# Patient Record
Sex: Female | Born: 1975 | Race: White | Hispanic: No | Marital: Married | Smoking: Current every day smoker
Health system: Southern US, Community
[De-identification: ages and names within clinical notes are randomized; demographics above are authoritative.]

## PROBLEM LIST (undated history)

## (undated) DIAGNOSIS — Z7289 Other problems related to lifestyle: Secondary | ICD-10-CM

## (undated) DIAGNOSIS — Z9889 Other specified postprocedural states: Secondary | ICD-10-CM

## (undated) DIAGNOSIS — F32A Depression, unspecified: Secondary | ICD-10-CM

## (undated) DIAGNOSIS — R112 Nausea with vomiting, unspecified: Secondary | ICD-10-CM

## (undated) HISTORY — PX: CHOLECYSTECTOMY: SHX55

---

## 2021-01-03 DIAGNOSIS — Z720 Tobacco use: Secondary | ICD-10-CM | POA: Insufficient documentation

## 2021-09-17 ENCOUNTER — Emergency Department: Payer: 59

## 2021-09-17 ENCOUNTER — Other Ambulatory Visit: Payer: Self-pay

## 2021-09-17 ENCOUNTER — Emergency Department
Admission: EM | Admit: 2021-09-17 | Discharge: 2021-09-17 | Disposition: A | Discharge status: Home / Self Care | Payer: 59 | Attending: Emergency Medicine | Admitting: Emergency Medicine

## 2021-09-17 DIAGNOSIS — M546 Pain in thoracic spine: Secondary | ICD-10-CM | POA: Insufficient documentation

## 2021-09-17 DIAGNOSIS — M25511 Pain in right shoulder: Secondary | ICD-10-CM | POA: Diagnosis present

## 2021-09-17 DIAGNOSIS — M62830 Muscle spasm of back: Secondary | ICD-10-CM | POA: Insufficient documentation

## 2021-09-17 LAB — CBC
HCT: 42.6 % (ref 36.0–46.0)
Hemoglobin: 14.6 g/dL (ref 12.0–15.0)
MCH: 30.9 pg (ref 26.0–34.0)
MCHC: 34.3 g/dL (ref 30.0–36.0)
MCV: 90.1 fL (ref 80.0–100.0)
Platelets: 125 10*3/uL — ABNORMAL LOW (ref 150–400)
RBC: 4.73 MIL/uL (ref 3.87–5.11)
RDW: 12 % (ref 11.5–15.5)
WBC: 4.1 10*3/uL (ref 4.0–10.5)
nRBC: 0 % (ref 0.0–0.2)

## 2021-09-17 LAB — BASIC METABOLIC PANEL
Anion gap: 10 (ref 5–15)
BUN: 23 mg/dL — ABNORMAL HIGH (ref 6–20)
CO2: 26 mmol/L (ref 22–32)
Calcium: 8.9 mg/dL (ref 8.9–10.3)
Chloride: 104 mmol/L (ref 98–111)
Creatinine, Ser: 0.88 mg/dL (ref 0.44–1.00)
GFR, Estimated: >60 mL/min (ref >60)
Glucose, Bld: 108 mg/dL — ABNORMAL HIGH (ref 70–99)
Potassium: 3.8 mmol/L (ref 3.5–5.1)
Sodium: 140 mmol/L (ref 135–145)

## 2021-09-17 LAB — TROPONIN I (HIGH SENSITIVITY): Troponin I (High Sensitivity): <2 ng/L (ref <18)

## 2021-09-17 IMAGING — CR DG SHOULDER 2+V*R*
1 series · 3 of 3 positions shown · non-contrast
Comparison: None.

CLINICAL DATA: Right-sided shoulder pain, no injury

EXAM:
RIGHT SHOULDER - 2+ VIEW

[Series 1: dg shoulder right · 0.14mm/px · 3 of 3 slices shown]
[im 1/3]
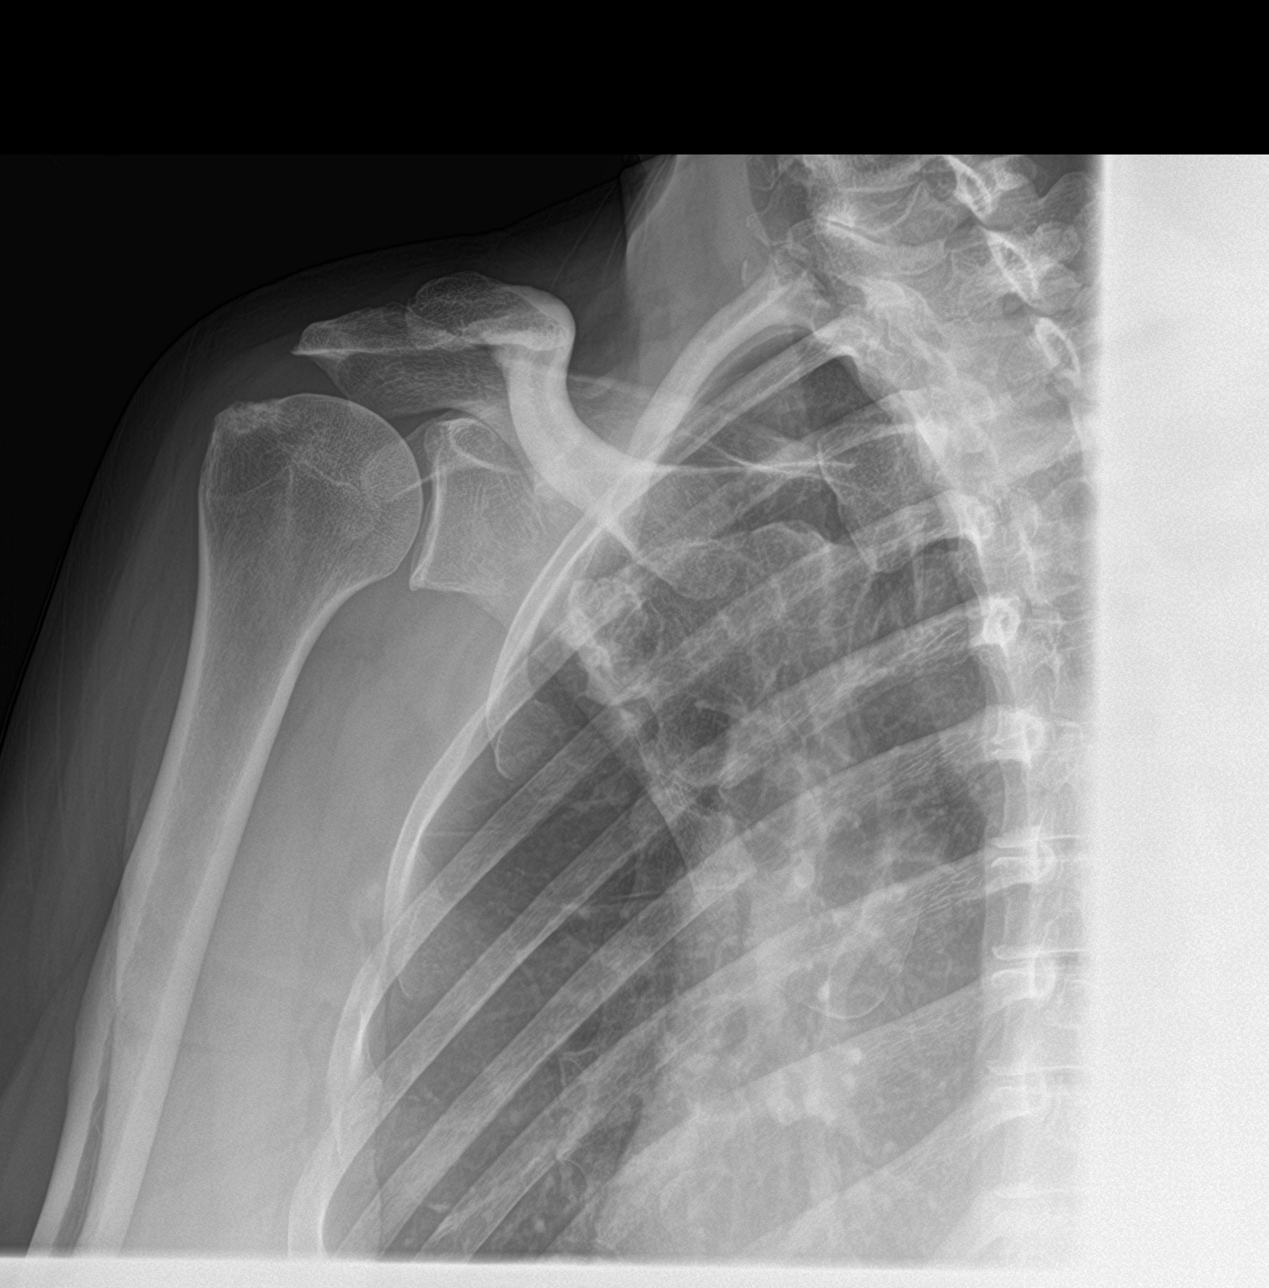
[im 2/3]
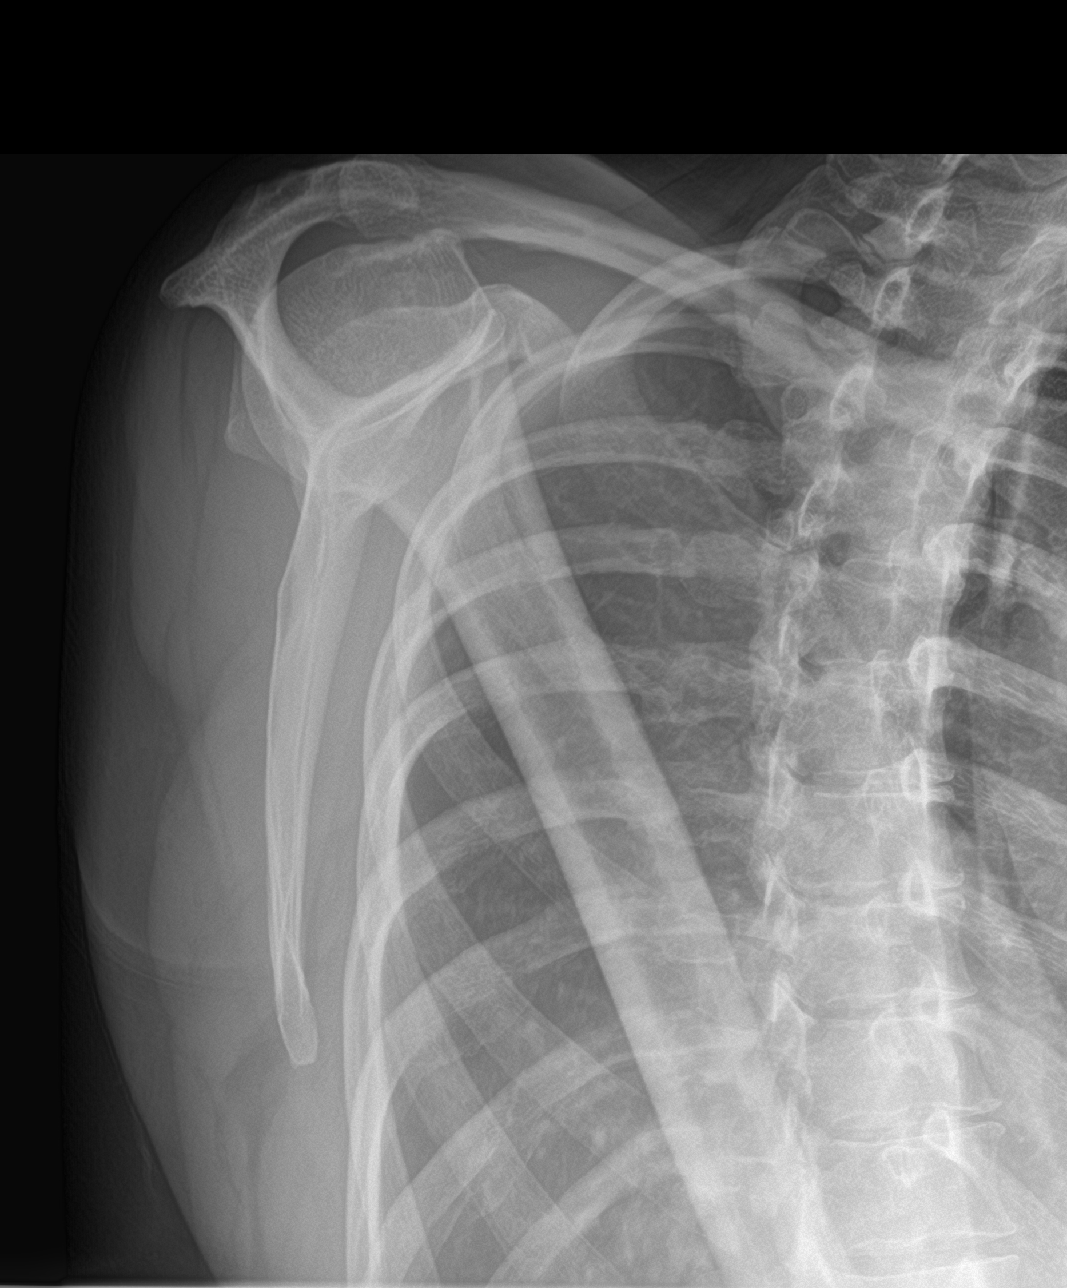
[im 3/3]
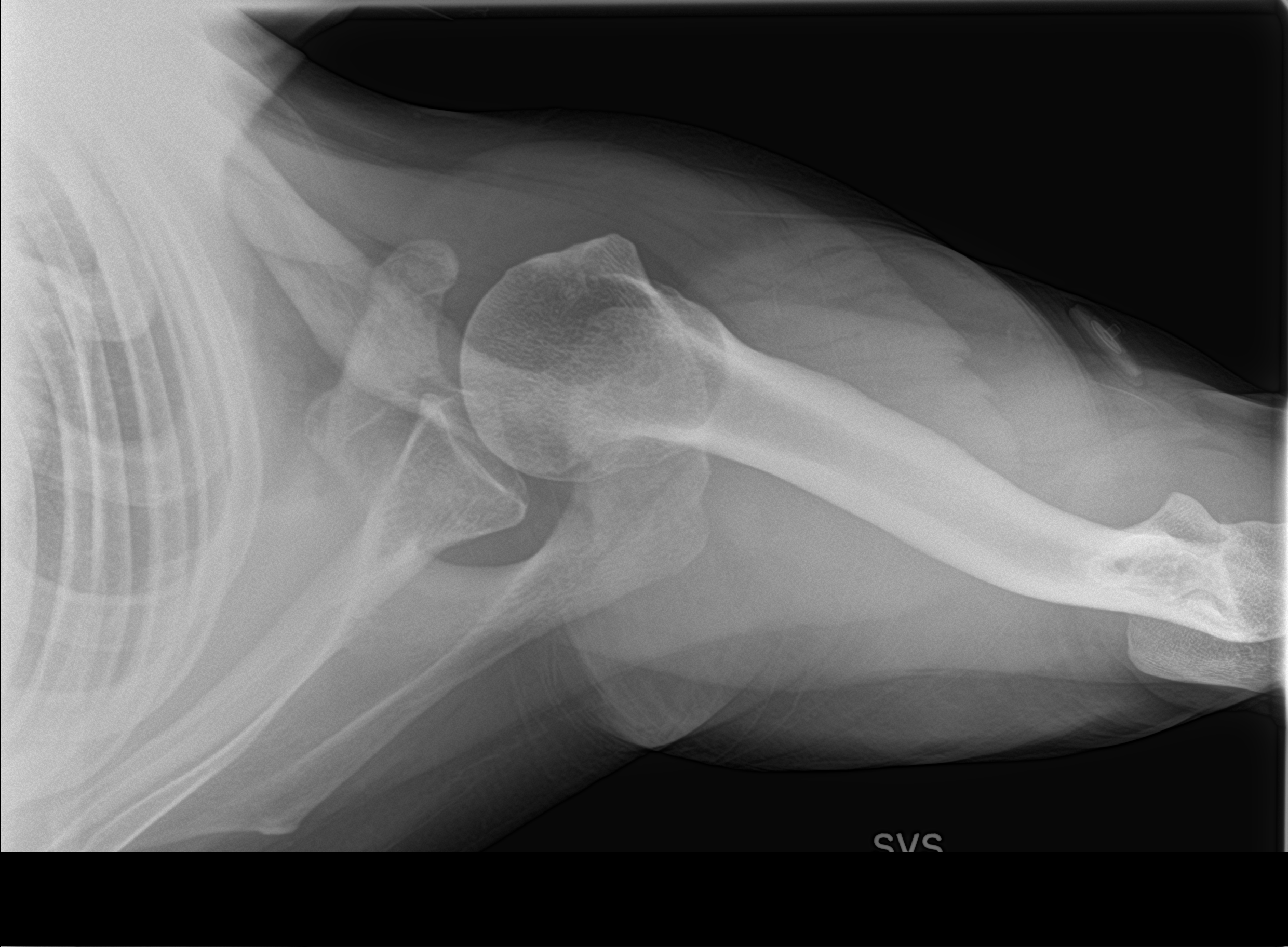

[3 of 3 positions shown; findings below may reference images not displayed]

FINDINGS: No fracture or dislocation of the right shoulder. Mild glenohumeral
and acromioclavicular arthrosis. The partially imaged right chest is
unremarkable.
IMPRESSION: No fracture or dislocation of the right shoulder. Mild glenohumeral
and acromioclavicular arthrosis.

## 2021-09-17 IMAGING — CR DG CHEST 2V
1 series · 2 of 2 positions shown · non-contrast
Comparison: None.

CLINICAL DATA: Right-sided chest pain, no injury

EXAM:
CHEST - 2 VIEW

[Series 1: dg chest 2 view · 0.14mm/px · 2 of 2 slices shown]
[im 1/2]
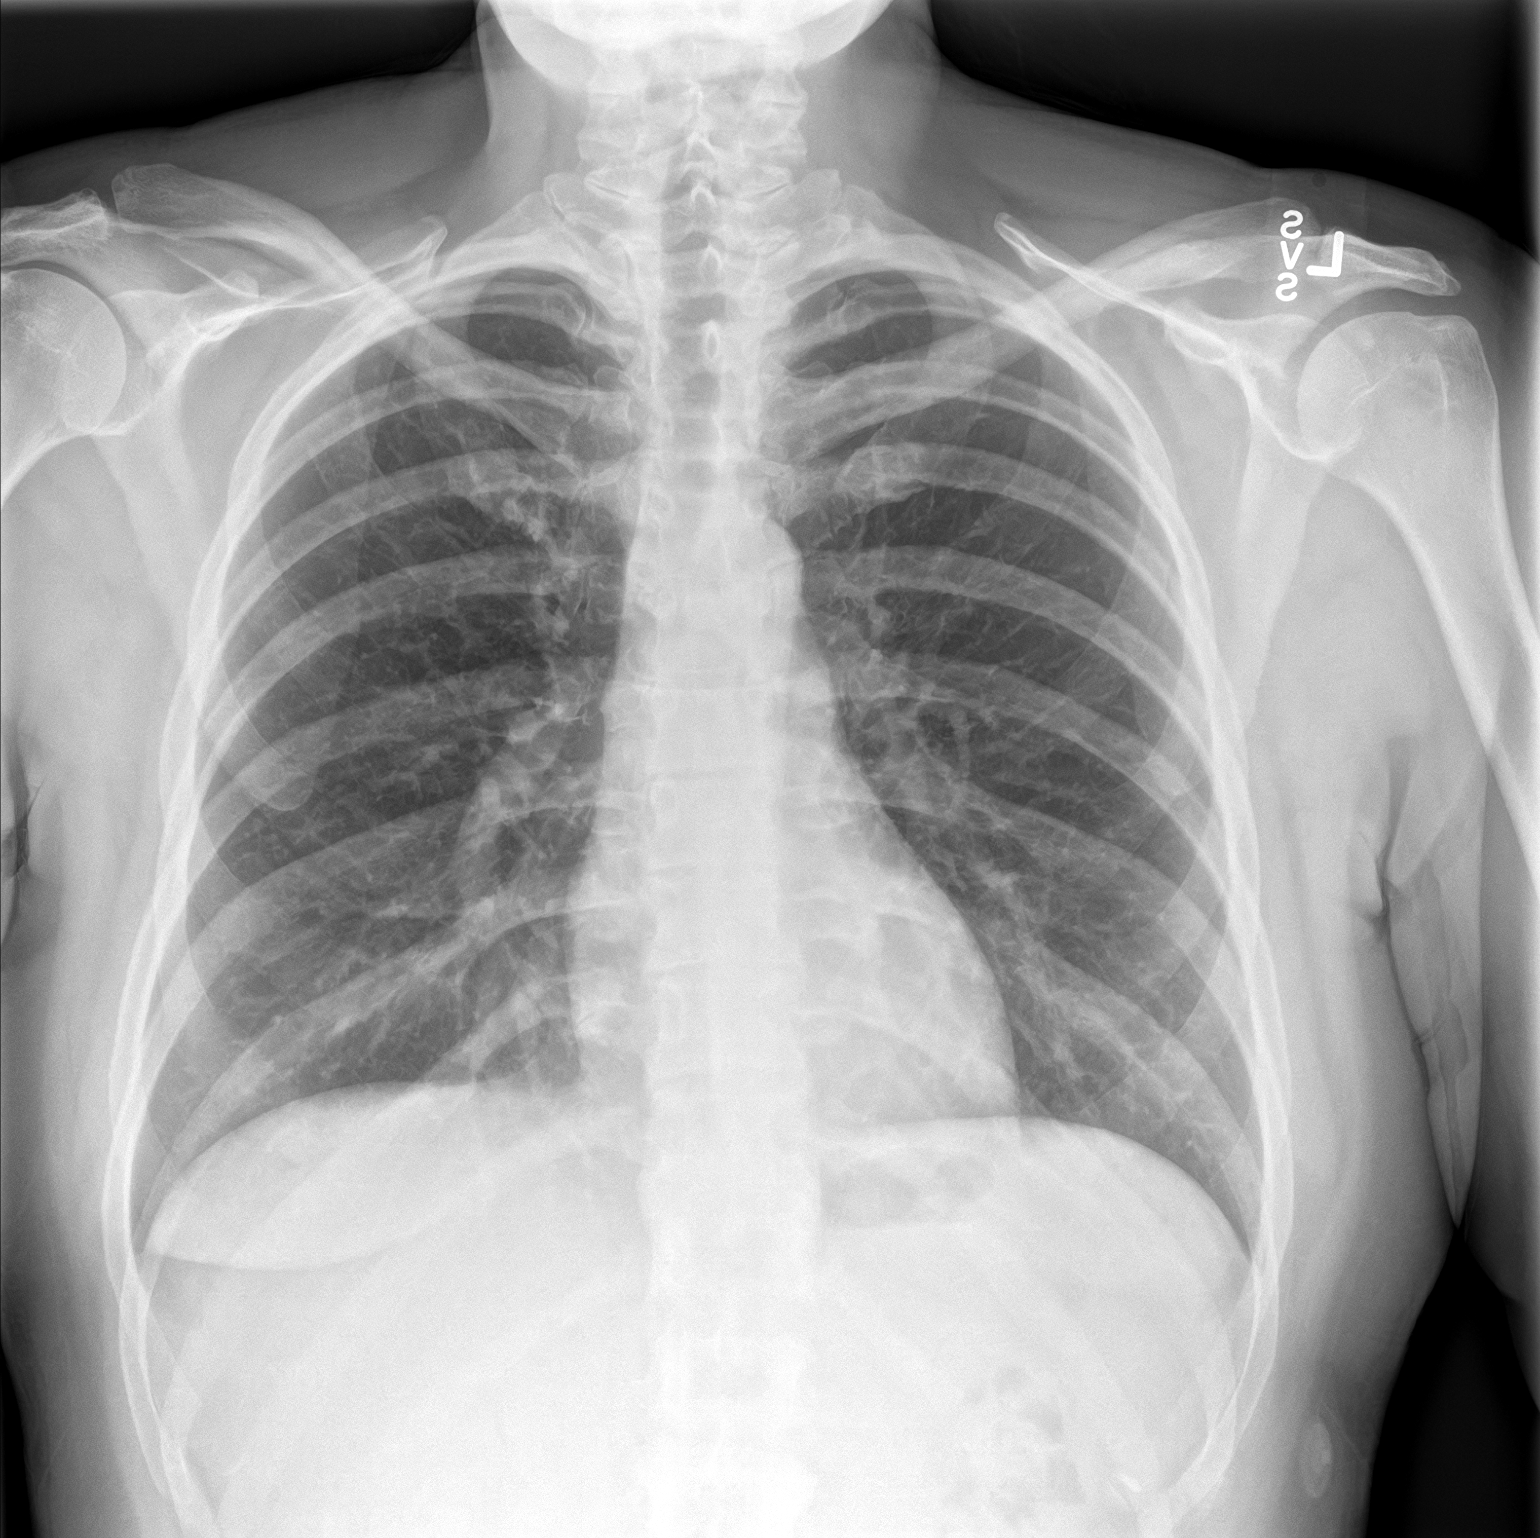
[im 2/2]
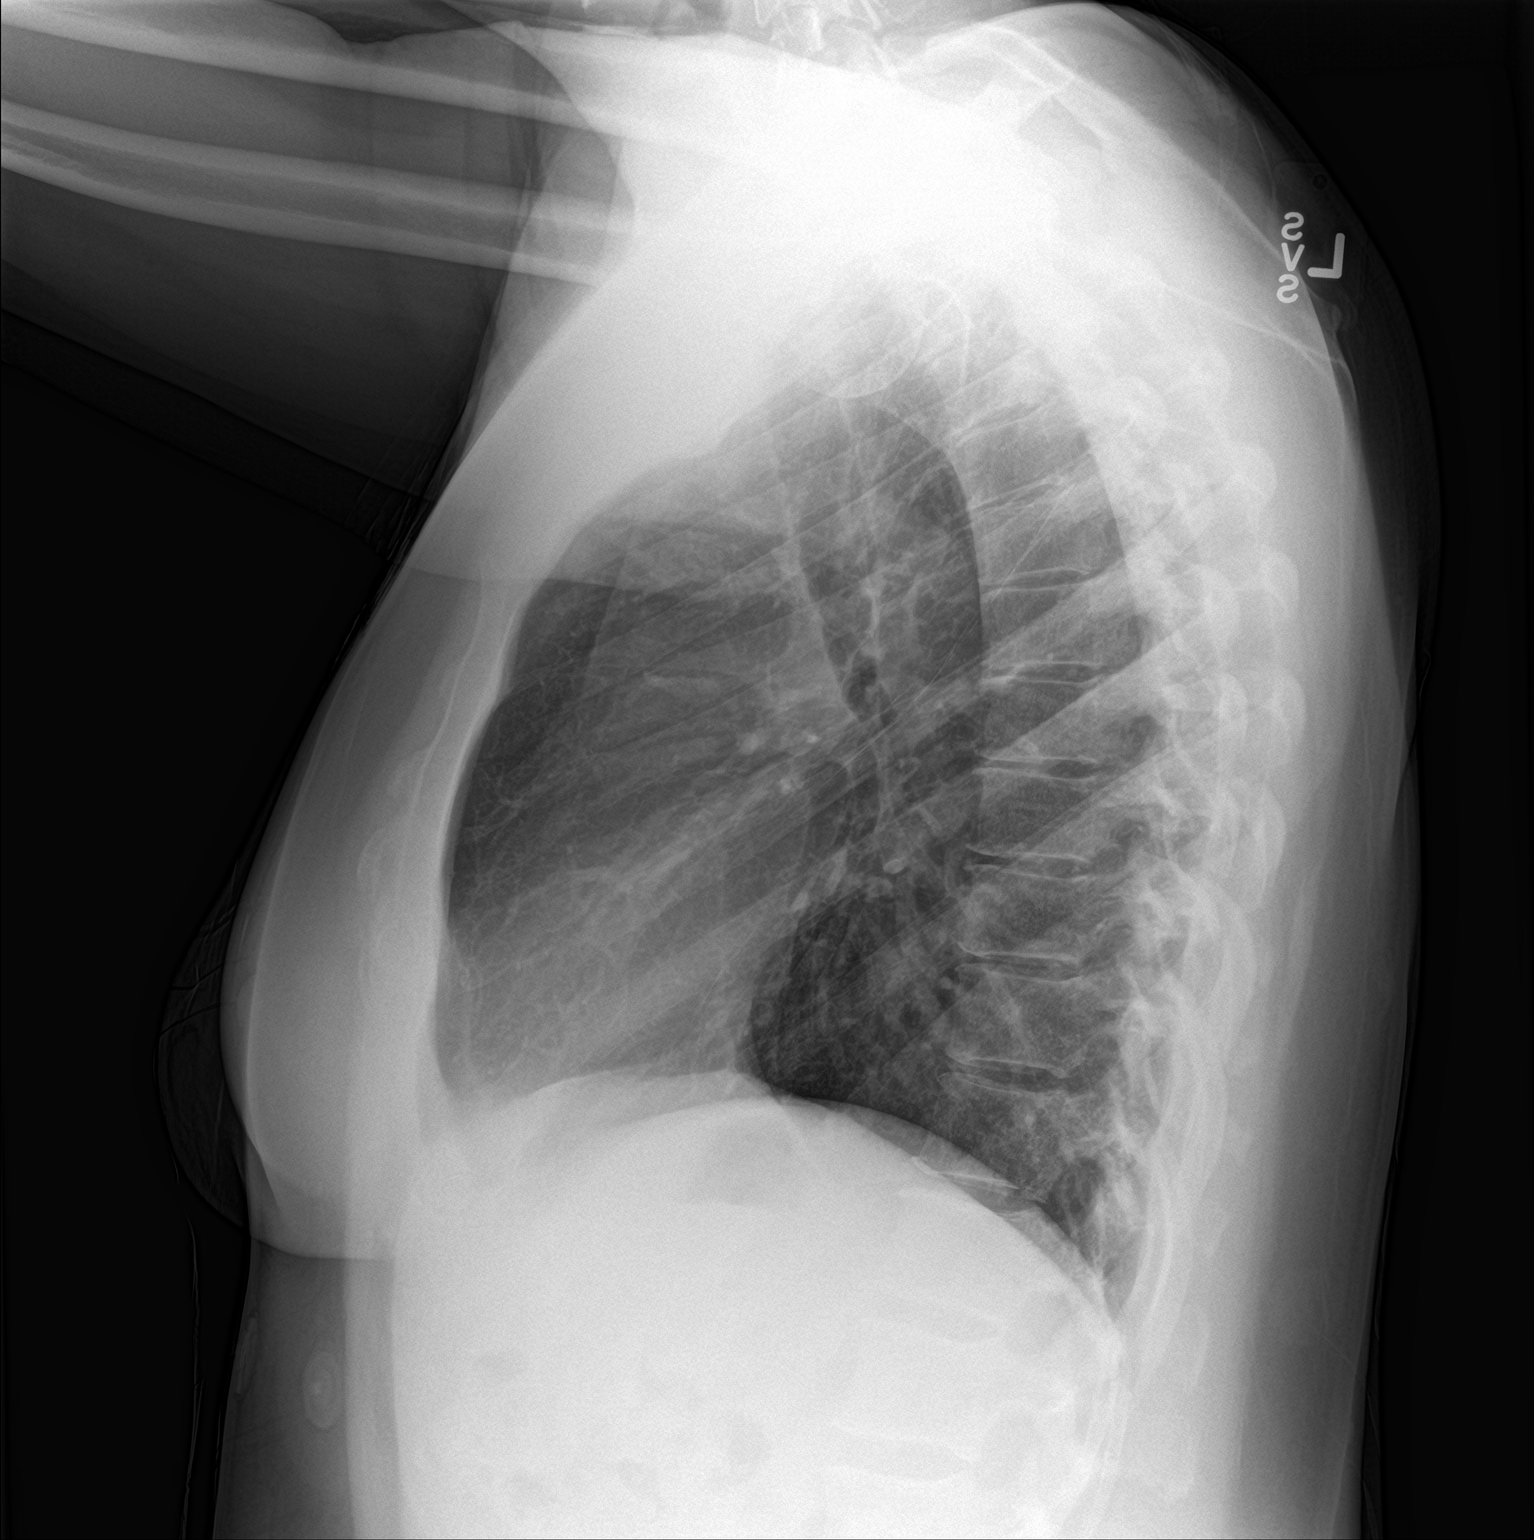

[2 of 2 positions shown; findings below may reference images not displayed]

FINDINGS: The heart size and mediastinal contours are within normal limits.
Both lungs are clear. The visualized skeletal structures are
unremarkable.
IMPRESSION: No acute abnormality of the lungs.

## 2021-09-17 MED ORDER — NAPROXEN 500 MG PO TABS: 500.0000 mg | ORAL_TABLET | Freq: Two times a day (BID) | ORAL | 0 refills | Status: DC

## 2021-09-17 MED ORDER — ONDANSETRON 4 MG PO TBDP
ORAL_TABLET | ORAL | Status: AC
  Filled 2021-09-17: qty 1

## 2021-09-17 MED ORDER — KETOROLAC TROMETHAMINE 60 MG/2ML IM SOLN
60.0000 mg | Freq: Once | INTRAMUSCULAR | Status: AC
  Administered 2021-09-17: 60 mg via INTRAMUSCULAR
  Filled 2021-09-17: qty 2

## 2021-09-17 MED ORDER — ONDANSETRON HCL 4 MG/2ML IJ SOLN: 4.0000 mg | Freq: Once | INTRAMUSCULAR | Status: DC

## 2021-09-17 MED ORDER — CYCLOBENZAPRINE HCL 10 MG PO TABS: 10.0000 mg | ORAL_TABLET | Freq: Three times a day (TID) | ORAL | 0 refills | Status: DC | PRN

## 2021-09-17 MED ORDER — MORPHINE SULFATE (PF) 4 MG/ML IV SOLN
6.0000 mg | Freq: Once | INTRAVENOUS | Status: AC
Start: 2021-09-17 — End: 2021-09-17
  Administered 2021-09-17: 6 mg via INTRAMUSCULAR
  Filled 2021-09-17: qty 2

## 2021-09-17 MED ORDER — MORPHINE SULFATE (PF) 4 MG/ML IV SOLN
6.0000 mg | Freq: Once | INTRAVENOUS | Status: DC
Start: 2021-09-17 — End: 2021-09-17

## 2021-09-17 MED ORDER — KETOROLAC TROMETHAMINE 30 MG/ML IJ SOLN
15.0000 mg | Freq: Once | INTRAMUSCULAR | Status: DC
Start: 2021-09-17 — End: 2021-09-17

## 2021-09-17 MED ORDER — ONDANSETRON 4 MG PO TBDP
4.0000 mg | ORAL_TABLET | Freq: Once | ORAL | Status: AC
  Administered 2021-09-17: 4 mg via ORAL
  Filled 2021-09-17: qty 1

## 2021-09-17 MED ORDER — BUPIVACAINE HCL (PF) 0.25 % IJ SOLN
10.0000 mL | Freq: Once | INTRAMUSCULAR | Status: AC
  Administered 2021-09-17: 10 mL
  Filled 2021-09-17: qty 10

## 2021-09-17 MED ORDER — ALUM & MAG HYDROXIDE-SIMETH 200-200-20 MG/5ML PO SUSP
30.0000 mL | Freq: Once | ORAL | Status: AC
Start: 2021-09-17 — End: 2021-09-17
  Administered 2021-09-17: 30 mL via ORAL
  Filled 2021-09-17: qty 30

## 2021-09-17 MED ORDER — LIDOCAINE VISCOUS HCL 2 % MT SOLN
15.0000 mL | Freq: Once | OROMUCOSAL | Status: AC
Start: 2021-09-17 — End: 2021-09-17
  Administered 2021-09-17: 15 mL via ORAL
  Filled 2021-09-17: qty 15

## 2021-09-17 NOTE — ED Provider Notes (Signed)
? ?[facility] ?Provider Note ? ? ? Event Date/Time  ? First MD Initiated Contact with Patient 09/17/21 2031   ?  (approximate) ? ? ?History  ? ?Shoulder Pain ? ? ?HPI ? ?Jill Cox is a 46 y.o. female here with shoulder/back pain.  The patient states that earlier today, after she was getting out of the car after driving from [place], she experienced acute, severe, paraspinal back/upper shoulder pain.  The pain is severe, worse with any movement, and limiting her ability to move or use her right arm.  She denies any trauma.  She does state that she was picking up sticks and helping around a house that she is building in [place], but denies any significant or direct trauma.  She also was using her phone in the car.  Denies history of similar issues.  Denies numbness or weakness of the upper or lower extremities.  Denies any cough.  No pleuritic component.  No leg swelling.  No recent immobilization.  She is not on OCPs. ?  ? ? ?Physical Exam  ? ?Triage Vital Signs: ?ED Triage Vitals [09/17/21 [id number]  ?Enc Vitals Group  ?   BP (!) 163/89  ?   Pulse Rate 85  ?   Resp 16  ?   Temp 99.2 ?F (37.3 ?C)  ?   Temp Source Oral  ?   SpO2 100 %  ?   Weight 180 lb (81.6 kg)  ?   Height 5\' 6"  (1.676 m)  ?   Head Circumference   ?   Peak Flow   ?   Pain Score 6  ?   Pain Loc   ?   Pain Edu?   ?   Excl. in [place]?   ? ? ?Most recent vital signs: ?Vitals:  ? 09/17/21 1904 09/17/21 2345  ?BP: (!) 163/89 (!) 148/72  ?Pulse: 85 86  ?Resp: 16 16  ?Temp: 99.2 ?F (37.3 ?C)   ?SpO2: 100% 100%  ? ? ? ?General: Awake, no distress.  ?CV:  Good peripheral perfusion.  Regular rate and rhythm.  No murmurs. ?Resp:  Normal effort.  No chest wall tenderness.  Lungs clear. ?Abd:  No distention.  Abdomen soft, nontender, nondistended ?Other:  Pinpoint tenderness to palpation over the right paraspinal area between the upper thoracic spine and scapula.  There is a focal area of tenderness that correlates with her reported  pain.  No overlying skin changes.  No bruising.  Strength and sensation fully intact bilateral upper and lower extremities. ? ? ?ED Results / Procedures / Treatments  ? ?Labs ?(all labs ordered are listed, but only abnormal results are displayed) ?Labs Reviewed  ?BASIC METABOLIC PANEL - Abnormal; Notable for the following components:  ?    Result Value  ? Glucose, Bld 108 (*)   ? BUN 23 (*)   ? All other components within normal limits  ?CBC - Abnormal; Notable for the following components:  ? Platelets 125 (*)   ? All other components within normal limits  ?POC URINE PREG, ED  ?POC URINE PREG, ED  ?TROPONIN I (HIGH SENSITIVITY)  ? ? ? ?EKG normal sinus rhythm, ventricular rate 81.  PR 114, QRS 88, QTc 453.  No acute ST elevations or depressions. ? ? ? ?RADIOLOGY ?Chest x-ray: No acute abnormality ?DG shoulder right: No fracture or dislocation ? ? ?I also independently reviewed and agree wit radiologist interpretations. ? ? ?PROCEDURES: ? ?Critical Care performed: No ? ? ?  MEDICATIONS ORDERED IN ED: ?Medications  ?bupivacaine (PF) (MARCAINE) 0.25 % injection 10 mL (10 mLs Infiltration Given by Other 09/17/21 2200)  ?ondansetron (ZOFRAN-ODT) disintegrating tablet 4 mg (4 mg Oral Given 09/17/21 2156)  ?ketorolac (TORADOL) injection 60 mg (60 mg Intramuscular Given 09/17/21 2125)  ?morphine (PF) 4 MG/ML injection 6 mg (6 mg Intramuscular Given 09/17/21 2125)  ?alum & mag hydroxide-simeth (MAALOX/MYLANTA) 200-200-20 MG/5ML suspension 30 mL (30 mLs Oral Given 09/17/21 2253)  ?  And  ?lidocaine (XYLOCAINE) 2 % viscous mouth solution 15 mL (15 mLs Oral Given 09/17/21 2253)  ? ? ? ?IMPRESSION / MDM / ASSESSMENT AND PLAN / ED COURSE  ?I reviewed the triage vital signs and the nursing notes. ?             ?               ? ? ?The patient is on the cardiac monitor to evaluate for evidence of arrhythmia and/or significant heart rate changes. ? ? ?MDM:  ?46 yo F here with acute, R paraspinal back/shoulder pain. Suspect paraspinal spasm,  trapezius strain in setting of picking up sticks at her property. Pt has pinpoint, reproducible tenderness. No weakness, numbness, or signs of radiculopathy. CXR is clear. No bony abnormalities noted on radiography. EKG nonischemic, trop negative, do not suspect referred cardiac pain. She is PERC negative and has no tachypnea, tachycardia, hypoxia, or signs of PE. No leukocytosis or anemia. Renal function is normal. Pt given IM analgesia with mild improvement, but +nausea 2/2 opioids. Discussed management options, and given pt's intolerance of analgesia, local trigger point injection performed with 5 cc of 0.25% bupivicaine. Full sterile technique used, needle inserted and aspirated with no return of blood or air, then injected. Pt tolerated well and had sx improvement. Will dc with NSAIDs, flexeril, outpt follow-up. ? ? ?MEDICATIONS GIVEN IN ED: ?Medications  ?bupivacaine (PF) (MARCAINE) 0.25 % injection 10 mL (10 mLs Infiltration Given by Other 09/17/21 2200)  ?ondansetron (ZOFRAN-ODT) disintegrating tablet 4 mg (4 mg Oral Given 09/17/21 2156)  ?ketorolac (TORADOL) injection 60 mg (60 mg Intramuscular Given 09/17/21 2125)  ?morphine (PF) 4 MG/ML injection 6 mg (6 mg Intramuscular Given 09/17/21 2125)  ?alum & mag hydroxide-simeth (MAALOX/MYLANTA) 200-200-20 MG/5ML suspension 30 mL (30 mLs Oral Given 09/17/21 2253)  ?  And  ?lidocaine (XYLOCAINE) 2 % viscous mouth solution 15 mL (15 mLs Oral Given 09/17/21 2253)  ? ? ? ?Consults:  ?None ? ? ?EMR reviewed  ?NA ? ? ? ? ?FINAL CLINICAL IMPRESSION(S) / ED DIAGNOSES  ? ?Final diagnoses:  ?Paraspinal muscle spasm  ? ? ? ?Rx / DC Orders  ? ?ED Discharge Orders   ? ?      Ordered  ?  naproxen (NAPROSYN) 500 MG tablet  2 times daily with meals       ? 09/17/21 2323  ?  cyclobenzaprine (FLEXERIL) 10 MG tablet  3 times daily PRN       ? 09/17/21 2323  ? ?  ?  ? ?  ? ? ? ?Note:  This document was prepared using Dragon voice recognition software and may include unintentional dictation  errors. ?  ?Duffy Bruce, MD ?09/18/21 0155 ? ?

## 2021-09-17 NOTE — Discharge Instructions (Signed)
As we discussed, I suspect your symptoms are due to muscular spasm from a paraspinal muscle. ? ?I would recommend taking prescription strength naproxen twice daily with a meal for 7 days.  This is essentially a stronger dose of Aleve.  It should not cause nausea. ? ?You can take the Flexeril as needed for muscle spasms.  I would consider taking at least 1/2 tablet 3 times a day for the next 2 to 3 days to help prevent recurrent spasm, then taking as needed. ? ?You can take Tylenol 1000 mg every 6 hours for pain as well. ? ?Consider a topical Lidocaine patch or Voltaren gel (both can be purchased over-the-counter) for pain. ?

## 2021-09-17 NOTE — ED Triage Notes (Signed)
Pt with sudden onset of right posterior shoulder pain approx 4 hours ago. Pt denies shob, chest pain, radiation down arm.  ?

## 2021-09-17 NOTE — ED Notes (Signed)
Medicated for nausea per verbal order. Provider at bedside ?

## 2021-09-18 NOTE — ED Notes (Signed)
Discharge instructions, scripts and follow-up information provided to patient. Patient verbalized understanding. Patient ambulated to the waiting room with a steady gait and without assistance. ?

## 2021-09-23 ENCOUNTER — Emergency Department
Admission: EM | Admit: 2021-09-23 | Discharge: 2021-09-23 | Disposition: A | Discharge status: Home / Self Care | Payer: 59 | Source: Home / Self Care | Attending: Emergency Medicine | Admitting: Emergency Medicine

## 2021-09-23 ENCOUNTER — Other Ambulatory Visit: Payer: Self-pay

## 2021-09-23 DIAGNOSIS — M25532 Pain in left wrist: Secondary | ICD-10-CM | POA: Insufficient documentation

## 2021-09-23 DIAGNOSIS — M791 Myalgia, unspecified site: Secondary | ICD-10-CM | POA: Insufficient documentation

## 2021-09-23 DIAGNOSIS — A419 Sepsis, unspecified organism: Secondary | ICD-10-CM | POA: Diagnosis not present

## 2021-09-23 DIAGNOSIS — M130 Polyarthritis, unspecified: Secondary | ICD-10-CM | POA: Insufficient documentation

## 2021-09-23 DIAGNOSIS — M25571 Pain in right ankle and joints of right foot: Secondary | ICD-10-CM | POA: Insufficient documentation

## 2021-09-23 DIAGNOSIS — R7881 Bacteremia: Secondary | ICD-10-CM | POA: Diagnosis not present

## 2021-09-23 LAB — CBC WITH DIFFERENTIAL/PLATELET
Abs Immature Granulocytes: 0.31 10*3/uL — ABNORMAL HIGH (ref 0.00–0.07)
Basophils Absolute: 0 10*3/uL (ref 0.0–0.1)
Basophils Relative: 0 %
Eosinophils Absolute: 0 10*3/uL (ref 0.0–0.5)
Eosinophils Relative: 0 %
HCT: 42.1 % (ref 36.0–46.0)
Hemoglobin: 14.1 g/dL (ref 12.0–15.0)
Immature Granulocytes: 2 %
Lymphocytes Relative: 3 %
Lymphs Abs: 0.5 10*3/uL — ABNORMAL LOW (ref 0.7–4.0)
MCH: 30.3 pg (ref 26.0–34.0)
MCHC: 33.5 g/dL (ref 30.0–36.0)
MCV: 90.5 fL (ref 80.0–100.0)
Monocytes Absolute: 0.3 10*3/uL (ref 0.1–1.0)
Monocytes Relative: 2 %
Neutro Abs: 14.9 10*3/uL — ABNORMAL HIGH (ref 1.7–7.7)
Neutrophils Relative %: 93 %
Platelets: 130 10*3/uL — ABNORMAL LOW (ref 150–400)
RBC: 4.65 MIL/uL (ref 3.87–5.11)
RDW: 13.5 % (ref 11.5–15.5)
WBC: 16 10*3/uL — ABNORMAL HIGH (ref 4.0–10.5)
nRBC: 0 % (ref 0.0–0.2)

## 2021-09-23 LAB — COMPREHENSIVE METABOLIC PANEL
ALT: 59 U/L — ABNORMAL HIGH (ref 0–44)
AST: 54 U/L — ABNORMAL HIGH (ref 15–41)
Albumin: 2.7 g/dL — ABNORMAL LOW (ref 3.5–5.0)
Alkaline Phosphatase: 121 U/L (ref 38–126)
Anion gap: 11 (ref 5–15)
BUN: 32 mg/dL — ABNORMAL HIGH (ref 6–20)
CO2: 22 mmol/L (ref 22–32)
Calcium: 10.5 mg/dL — ABNORMAL HIGH (ref 8.9–10.3)
Chloride: 104 mmol/L (ref 98–111)
Creatinine, Ser: 1.27 mg/dL — ABNORMAL HIGH (ref 0.44–1.00)
GFR, Estimated: 52 mL/min — ABNORMAL LOW (ref >60)
Glucose, Bld: 108 mg/dL — ABNORMAL HIGH (ref 70–99)
Potassium: 3.6 mmol/L (ref 3.5–5.1)
Sodium: 137 mmol/L (ref 135–145)
Total Bilirubin: 1.2 mg/dL (ref 0.3–1.2)
Total Protein: 6.5 g/dL (ref 6.5–8.1)

## 2021-09-23 LAB — CK: Total CK: 267 U/L — ABNORMAL HIGH (ref 38–234)

## 2021-09-23 MED ORDER — ONDANSETRON HCL 4 MG/2ML IJ SOLN
4.0000 mg | Freq: Once | INTRAMUSCULAR | Status: AC
  Administered 2021-09-23: 4 mg via INTRAVENOUS
  Filled 2021-09-23: qty 2

## 2021-09-23 MED ORDER — OXYCODONE-ACETAMINOPHEN 5-325 MG PO TABS: 1.0000 | ORAL_TABLET | ORAL | 0 refills | Status: DC | PRN

## 2021-09-23 MED ORDER — SODIUM CHLORIDE 0.9 % IV BOLUS
1000.0000 mL | Freq: Once | INTRAVENOUS | Status: AC
Start: 2021-09-23 — End: 2021-09-23
  Administered 2021-09-23: 1000 mL via INTRAVENOUS

## 2021-09-23 MED ORDER — MORPHINE SULFATE (PF) 2 MG/ML IV SOLN
2.0000 mg | Freq: Once | INTRAVENOUS | Status: AC
  Administered 2021-09-23: 2 mg via INTRAVENOUS
  Filled 2021-09-23: qty 1

## 2021-09-23 MED ORDER — CEPHALEXIN 500 MG PO CAPS
500.0000 mg | ORAL_CAPSULE | Freq: Once | ORAL | Status: AC
  Administered 2021-09-23: 500 mg via ORAL
  Filled 2021-09-23: qty 1

## 2021-09-23 MED ORDER — CEPHALEXIN 500 MG PO CAPS: 500.0000 mg | ORAL_CAPSULE | Freq: Four times a day (QID) | ORAL | 0 refills | Status: DC

## 2021-09-23 MED ORDER — NAPROXEN 500 MG PO TABS: 500.0000 mg | ORAL_TABLET | Freq: Two times a day (BID) | ORAL | 0 refills | Status: DC

## 2021-09-23 MED ORDER — PREDNISONE 20 MG PO TABS: 60.0000 mg | ORAL_TABLET | Freq: Every day | ORAL | 0 refills | Status: DC

## 2021-09-23 MED ORDER — HYDROMORPHONE HCL 1 MG/ML IJ SOLN
0.5000 mg | Freq: Once | INTRAMUSCULAR | Status: AC
  Administered 2021-09-23: 0.5 mg via INTRAVENOUS
  Filled 2021-09-23: qty 1

## 2021-09-23 NOTE — ED Triage Notes (Addendum)
Patient to ER via POV with complaints of joint aches. Reports on Wednesday she experienced severe thigh cramps. Now has swelling to bilateral ankles and wrists.Wrists red and swollen. Denies any injury or recent illness. States she is very physically active. Reports she was preparing to start marathon training but is now having difficulty even walking. No other complaints.  ?

## 2021-09-23 NOTE — Discharge Instructions (Addendum)
You may have an inflammatory arthritis, but also may be developing an infection on the skin of your thighs.  I have prescribed you Percocet which you can take as needed for breakthrough pain.  Please also take the naproxen twice a day for pain.  I would also like you to take 60 mg of prednisone for the next 5 days.  Please call to schedule an appointment with rheumatology on Monday.  If you develop fevers or any worsening symptoms or any new symptoms that are concerning to you, please return to the emergency department. ?

## 2021-09-23 NOTE — ED Provider Notes (Signed)
[facility] Provider Note    Event Date/Time   First MD Initiated Contact with Patient 09/23/21 1255     (approximate)   History   Joint pain   HPI  Jill Cox is a 46 y.o. female with no significant past medical history presents with joint pain.  Patient's symptoms started about 1 week ago.  She initially started with upper back pain was seen in the emergency department had trigger point injection was ultimately discharged on naproxen and Flexeril.  On Wednesday of that week she started having bilateral thigh pain and then noticed pain in the left wrist followed by the right ankle.  The next day she developed pain in the left wrist as well as some swelling and redness.  She endorses significant pain with ambulation primarily in the thighs.  Denies fevers chills.  Prior to the onset of this she was clearing her backyard in [place], denies tick bite or any other insect exposure.  Denies any rashes.  No diarrhea, eye symptoms or vaginal discharge.  Denies history of similar.  No history of autoimmune disease or family history of this.  She followed up with her primary care provider after her ED visit and was started on a prednisone taper which she is taking and which has not provided any relief.    History reviewed. No pertinent past medical history.  There are no problems to display for this patient.    Physical Exam  Triage Vital Signs: ED Triage Vitals  Enc Vitals Group     BP 09/23/21 1203 125/73     Pulse Rate 09/23/21 1203 99     Resp 09/23/21 1203 17     Temp 09/23/21 1203 98 F (36.7 C)     Temp Source 09/23/21 1203 Oral     SpO2 09/23/21 1203 96 %     Weight 09/23/21 1200 180 lb (81.6 kg)     Height 09/23/21 1200 5\' 6"  (1.676 m)     Head Circumference --      Peak Flow --      Pain Score 09/23/21 1200 6     Pain Loc --      Pain Edu? --      Excl. in [place]? --     Most recent vital signs: Vitals:   09/23/21 1400 09/23/21 1719  BP:  122/77 115/76  Pulse:  74  Resp:  18  Temp:    SpO2:  95%     General: Awake, no distress.  CV:  Good peripheral perfusion.  No murmur Resp:  Normal effort.  Abd:  No distention.  Neuro:             Awake, Alert, Oriented x 3  Other:  Mild erythema over the volar surface of the right wrist, no swelling of the right wrist or tenderness with range of motion Erythema over the dorsal aspect of the left wrist with mild tenderness to palp patient and with range, no pain with micro movements Right ankle is mildly swollen with tenderness over the right Achilles tendon, no pain with micro movements no erythema  Patient's bilateral thighs are mildly tender to palpation with soft compartments, there is an area of erythema on the right medial thigh that is mildly tender to palpation   ED Results / Procedures / Treatments  Labs (all labs ordered are listed, but only abnormal results are displayed) Labs Reviewed  CK - Abnormal; Notable for the following components:  Result Value   Total CK 267 (*)    All other components within normal limits  COMPREHENSIVE METABOLIC PANEL - Abnormal; Notable for the following components:   Glucose, Bld 108 (*)    BUN 32 (*)    Creatinine, Ser 1.27 (*)    Calcium 10.5 (*)    Albumin 2.7 (*)    AST 54 (*)    ALT 59 (*)    GFR, Estimated 52 (*)    All other components within normal limits  CBC WITH DIFFERENTIAL/PLATELET - Abnormal; Notable for the following components:   WBC 16.0 (*)    Platelets 130 (*)    Neutro Abs 14.9 (*)    Lymphs Abs 0.5 (*)    Abs Immature Granulocytes 0.31 (*)    All other components within normal limits  CULTURE, BLOOD (SINGLE)  LYME DISEASE SEROLOGY W/REFLEX  C-REACTIVE PROTEIN     EKG     RADIOLOGY    PROCEDURES:  Critical Care performed: No  .1-3 Lead EKG Interpretation Performed by: Georga HackingMcHugh, Zainah Steven Rose, MD Authorized by: Georga HackingMcHugh, Alonzo Loving Rose, MD     Interpretation: normal     ECG rate assessment:  normal     Rhythm: sinus rhythm     Ectopy: none     Conduction: normal    The patient is on the cardiac monitor to evaluate for evidence of arrhythmia and/or significant heart rate changes.   MEDICATIONS ORDERED IN ED: Medications  sodium chloride 0.9 % bolus 1,000 mL (0 mLs Intravenous Stopped 09/23/21 1555)  morphine (PF) 2 MG/ML injection 2 mg (2 mg Intravenous Given 09/23/21 1428)  ondansetron (ZOFRAN) injection 4 mg (4 mg Intravenous Given 09/23/21 1425)  cephALEXin (KEFLEX) capsule 500 mg (500 mg Oral Given 09/23/21 1600)  HYDROmorphone (DILAUDID) injection 0.5 mg (0.5 mg Intravenous Given 09/23/21 1557)     IMPRESSION / MDM / ASSESSMENT AND PLAN / ED COURSE  I reviewed the triage vital signs and the nursing notes.                              Differential diagnosis includes, but is not limited to, inflammatory or infectious arthritis, Lyme arthritis, bacterial endocarditis  Patient is a 46 year old female who presents with joint pain and myalgias.  Symptoms started about a week ago initially with back pain she was evaluated in the emergency department and her back and scapula pain has improved.  However since that time she has developed pain in the bilateral thighs as well as pain over the volar aspect of the right wrist dorsal left wrist and ankle.  Patient normally is quite functional says she has run marathons before but is having difficulty getting around now.  She denies any rashes fevers chills or other systemic symptoms has not had diarrhea vaginal discharge conjunctivitis or other symptoms of STD.  Pertinent Sam findings include some erythema along the flexor tendons at the volar surface of the wrist, erythema over the dorsum of the left wrist with some pain with range of motion and some swelling of the right ankle with pain primarily over the right Achilles.  She also has some erythema over the right medial thigh and left lateral thigh which are tender to palpation.  Patient's  thighs are soft without evidence of compartment syndrome.  She has a normal pulse exam.  No joint has pain with micro movements to suggest septic arthritis.  Differential includes infectious versus inflammatory arthritis.  Patient has no history of autoimmune disease however this may be the first presentation.  Considered bacterial endocarditis or other source of bacteremia so blood culture sent.  She has no murmurs and no risk factors for this.  Also considered Lyme arthritis or other tickborne illness, patient has no exposures that she can recall and although she was working outside at her home in [place] this weekend I would not expect a tick exposure last week to present with polyarthritis at this point.  We will send Lyme titers.  Patient was initially prescribed naproxen and Flexeril during her ED visit and then a prednisone taper by her primary care provider.  Fortunately prednisone has not provided much relief.  I would like to increase her steroids to 60 mg for at least a week to see if this helps this is inflammatory in nature.  Her labs otherwise are notable for mild AKI and very mild elevation of her LFTs.  She has no abdominal pain on exam.  She was given a liter of fluid morphine and Dilaudid for pain there could be a developing cellulitis on the thighs so I did cover her with Keflex.  Her labs are otherwise notable for leukocytosis of 16 although she is on steroids so is possible that this is the steroid effect.  Ultimately do not feel that she requires admission as I am not concerned about a septic joint and she has no evidence of rhabdo or systemic symptoms.  I do think that she would benefit from close rheumatology follow-up.  I advised that she call on Monday to schedule an appointment.  Discharged with prescription for prednisone, Percocet and Keflex.  We discussed return precautions.        FINAL CLINICAL IMPRESSION(S) / ED DIAGNOSES   Final diagnoses:  Myalgia  Polyarthritis      Rx / DC Orders   ED Discharge Orders          Ordered    predniSONE (DELTASONE) 20 MG tablet  Daily with breakfast        09/23/21 1658    naproxen (NAPROSYN) 500 MG tablet  2 times daily with meals        09/23/21 1658    oxyCODONE-acetaminophen (PERCOCET) 5-325 MG tablet  Every 4 hours PRN        09/23/21 1658    cephALEXin (KEFLEX) 500 MG capsule  4 times daily        09/23/21 1658             Note:  This document was prepared using Dragon voice recognition software and may include unintentional dictation errors.   Georga Hacking, MD 09/23/21 [phone]

## 2021-09-24 ENCOUNTER — Encounter: Payer: Self-pay | Admitting: Emergency Medicine

## 2021-09-24 ENCOUNTER — Other Ambulatory Visit: Payer: Self-pay

## 2021-09-24 ENCOUNTER — Emergency Department: Payer: 59

## 2021-09-24 ENCOUNTER — Inpatient Hospital Stay
Admit: 2021-09-24 | Discharge: 2021-09-24 | Disposition: A | Discharge status: Home / Self Care | Payer: 59 | Attending: Internal Medicine | Admitting: Internal Medicine

## 2021-09-24 ENCOUNTER — Inpatient Hospital Stay
Admission: EM | Admit: 2021-09-24 | Discharge: 2021-09-26 | DRG: 871 | Disposition: A | Discharge status: Other Acute Inpatient Hospital | Payer: 59 | Source: Other Acute Inpatient Hospital | Attending: Internal Medicine | Admitting: Internal Medicine

## 2021-09-24 DIAGNOSIS — B9562 Methicillin resistant Staphylococcus aureus infection as the cause of diseases classified elsewhere: Secondary | ICD-10-CM | POA: Diagnosis not present

## 2021-09-24 DIAGNOSIS — M2550 Pain in unspecified joint: Secondary | ICD-10-CM

## 2021-09-24 DIAGNOSIS — G062 Extradural and subdural abscess, unspecified: Secondary | ICD-10-CM | POA: Diagnosis present

## 2021-09-24 DIAGNOSIS — I340 Nonrheumatic mitral (valve) insufficiency: Secondary | ICD-10-CM | POA: Diagnosis not present

## 2021-09-24 DIAGNOSIS — R6520 Severe sepsis without septic shock: Secondary | ICD-10-CM | POA: Diagnosis present

## 2021-09-24 DIAGNOSIS — Z9049 Acquired absence of other specified parts of digestive tract: Secondary | ICD-10-CM

## 2021-09-24 DIAGNOSIS — Z7952 Long term (current) use of systemic steroids: Secondary | ICD-10-CM

## 2021-09-24 DIAGNOSIS — A419 Sepsis, unspecified organism: Secondary | ICD-10-CM | POA: Diagnosis present

## 2021-09-24 DIAGNOSIS — A4902 Methicillin resistant Staphylococcus aureus infection, unspecified site: Secondary | ICD-10-CM | POA: Diagnosis present

## 2021-09-24 DIAGNOSIS — M79652 Pain in left thigh: Secondary | ICD-10-CM | POA: Diagnosis present

## 2021-09-24 DIAGNOSIS — Z6828 Body mass index (BMI) 28.0-28.9, adult: Secondary | ICD-10-CM

## 2021-09-24 DIAGNOSIS — L02416 Cutaneous abscess of left lower limb: Secondary | ICD-10-CM | POA: Diagnosis not present

## 2021-09-24 DIAGNOSIS — M79651 Pain in right thigh: Secondary | ICD-10-CM | POA: Diagnosis present

## 2021-09-24 DIAGNOSIS — Z79899 Other long term (current) drug therapy: Secondary | ICD-10-CM

## 2021-09-24 DIAGNOSIS — F17290 Nicotine dependence, other tobacco product, uncomplicated: Secondary | ICD-10-CM | POA: Diagnosis present

## 2021-09-24 DIAGNOSIS — M60009 Infective myositis, unspecified site: Secondary | ICD-10-CM | POA: Diagnosis not present

## 2021-09-24 DIAGNOSIS — D649 Anemia, unspecified: Secondary | ICD-10-CM | POA: Diagnosis not present

## 2021-09-24 DIAGNOSIS — E8770 Fluid overload, unspecified: Secondary | ICD-10-CM | POA: Diagnosis not present

## 2021-09-24 DIAGNOSIS — R7881 Bacteremia: Secondary | ICD-10-CM | POA: Diagnosis present

## 2021-09-24 DIAGNOSIS — M65051 Abscess of tendon sheath, right thigh: Secondary | ICD-10-CM | POA: Diagnosis not present

## 2021-09-24 DIAGNOSIS — Z20822 Contact with and (suspected) exposure to covid-19: Secondary | ICD-10-CM | POA: Diagnosis present

## 2021-09-24 DIAGNOSIS — Z7289 Other problems related to lifestyle: Secondary | ICD-10-CM

## 2021-09-24 DIAGNOSIS — M009 Pyogenic arthritis, unspecified: Secondary | ICD-10-CM | POA: Diagnosis present

## 2021-09-24 DIAGNOSIS — M00031 Staphylococcal arthritis, right wrist: Secondary | ICD-10-CM | POA: Diagnosis not present

## 2021-09-24 DIAGNOSIS — M6008 Infective myositis, other site: Secondary | ICD-10-CM | POA: Diagnosis present

## 2021-09-24 DIAGNOSIS — E860 Dehydration: Secondary | ICD-10-CM | POA: Diagnosis present

## 2021-09-24 DIAGNOSIS — E876 Hypokalemia: Secondary | ICD-10-CM | POA: Diagnosis not present

## 2021-09-24 DIAGNOSIS — M130 Polyarthritis, unspecified: Secondary | ICD-10-CM | POA: Diagnosis present

## 2021-09-24 DIAGNOSIS — E669 Obesity, unspecified: Secondary | ICD-10-CM | POA: Diagnosis present

## 2021-09-24 DIAGNOSIS — E8809 Other disorders of plasma-protein metabolism, not elsewhere classified: Secondary | ICD-10-CM | POA: Diagnosis not present

## 2021-09-24 DIAGNOSIS — M00071 Staphylococcal arthritis, right ankle and foot: Secondary | ICD-10-CM | POA: Diagnosis not present

## 2021-09-24 DIAGNOSIS — F32A Depression, unspecified: Secondary | ICD-10-CM | POA: Diagnosis present

## 2021-09-24 DIAGNOSIS — R609 Edema, unspecified: Secondary | ICD-10-CM

## 2021-09-24 DIAGNOSIS — M659 Synovitis and tenosynovitis, unspecified: Secondary | ICD-10-CM | POA: Diagnosis not present

## 2021-09-24 DIAGNOSIS — M4620 Osteomyelitis of vertebra, site unspecified: Secondary | ICD-10-CM | POA: Diagnosis not present

## 2021-09-24 DIAGNOSIS — S60812D Abrasion of left wrist, subsequent encounter: Secondary | ICD-10-CM | POA: Diagnosis not present

## 2021-09-24 DIAGNOSIS — M7989 Other specified soft tissue disorders: Secondary | ICD-10-CM | POA: Diagnosis not present

## 2021-09-24 DIAGNOSIS — K5900 Constipation, unspecified: Secondary | ICD-10-CM | POA: Diagnosis not present

## 2021-09-24 DIAGNOSIS — M00032 Staphylococcal arthritis, left wrist: Secondary | ICD-10-CM | POA: Diagnosis not present

## 2021-09-24 HISTORY — DX: Depression, unspecified: F32.A

## 2021-09-24 HISTORY — DX: Other problems related to lifestyle: Z72.89

## 2021-09-24 LAB — PROTIME-INR
INR: 1.1 (ref 0.8–1.2)
Prothrombin Time: 14.3 seconds (ref 11.4–15.2)

## 2021-09-24 LAB — BLOOD CULTURE ID PANEL (REFLEXED) - BCID2

## 2021-09-24 LAB — COMPREHENSIVE METABOLIC PANEL
ALT: 50 U/L — ABNORMAL HIGH (ref 0–44)
AST: 51 U/L — ABNORMAL HIGH (ref 15–41)
Albumin: 2.4 g/dL — ABNORMAL LOW (ref 3.5–5.0)
Alkaline Phosphatase: 119 U/L (ref 38–126)
Anion gap: 10 (ref 5–15)
BUN: 32 mg/dL — ABNORMAL HIGH (ref 6–20)
CO2: 26 mmol/L (ref 22–32)
Calcium: 9.8 mg/dL (ref 8.9–10.3)
Chloride: 103 mmol/L (ref 98–111)
Creatinine, Ser: 0.99 mg/dL (ref 0.44–1.00)
GFR, Estimated: >60 mL/min (ref >60)
Glucose, Bld: 110 mg/dL — ABNORMAL HIGH (ref 70–99)
Potassium: 3.5 mmol/L (ref 3.5–5.1)
Sodium: 139 mmol/L (ref 135–145)
Total Bilirubin: 1.1 mg/dL (ref 0.3–1.2)
Total Protein: 6.1 g/dL — ABNORMAL LOW (ref 6.5–8.1)

## 2021-09-24 LAB — URINALYSIS, COMPLETE (UACMP) WITH MICROSCOPIC
Bacteria, UA: NONE SEEN
Bilirubin Urine: NEGATIVE
Glucose, UA: NEGATIVE mg/dL
Hgb urine dipstick: NEGATIVE
Ketones, ur: NEGATIVE mg/dL
Leukocytes,Ua: NEGATIVE
Nitrite: NEGATIVE
Protein, ur: NEGATIVE mg/dL
Specific Gravity, Urine: 1.015 (ref 1.005–1.030)
pH: 5 (ref 5.0–8.0)

## 2021-09-24 LAB — CBC WITH DIFFERENTIAL/PLATELET
Abs Immature Granulocytes: 0.95 10*3/uL — ABNORMAL HIGH (ref 0.00–0.07)
Basophils Absolute: 0 10*3/uL (ref 0.0–0.1)
Basophils Relative: 0 %
Eosinophils Absolute: 0 10*3/uL (ref 0.0–0.5)
Eosinophils Relative: 0 %
HCT: 38.2 % (ref 36.0–46.0)
Hemoglobin: 12.5 g/dL (ref 12.0–15.0)
Immature Granulocytes: 6 %
Lymphocytes Relative: 3 %
Lymphs Abs: 0.5 10*3/uL — ABNORMAL LOW (ref 0.7–4.0)
MCH: 30.3 pg (ref 26.0–34.0)
MCHC: 32.7 g/dL (ref 30.0–36.0)
MCV: 92.7 fL (ref 80.0–100.0)
Monocytes Absolute: 0.3 10*3/uL (ref 0.1–1.0)
Monocytes Relative: 2 %
Neutro Abs: 15.4 10*3/uL — ABNORMAL HIGH (ref 1.7–7.7)
Neutrophils Relative %: 89 %
Platelets: 151 10*3/uL (ref 150–400)
RBC: 4.12 MIL/uL (ref 3.87–5.11)
RDW: 13.7 % (ref 11.5–15.5)
WBC: 17.3 10*3/uL — ABNORMAL HIGH (ref 4.0–10.5)
nRBC: 0 % (ref 0.0–0.2)

## 2021-09-24 LAB — URINE DRUG SCREEN, QUALITATIVE (ARMC ONLY)
Amphetamines, Ur Screen: NOT DETECTED
Barbiturates, Ur Screen: NOT DETECTED
Benzodiazepine, Ur Scrn: NOT DETECTED
Cannabinoid 50 Ng, Ur ~~LOC~~: NOT DETECTED
Cocaine Metabolite,Ur ~~LOC~~: NOT DETECTED
MDMA (Ecstasy)Ur Screen: NOT DETECTED
Methadone Scn, Ur: NOT DETECTED
Opiate, Ur Screen: NOT DETECTED
Phencyclidine (PCP) Ur S: NOT DETECTED
Tricyclic, Ur Screen: POSITIVE — AB

## 2021-09-24 LAB — LACTIC ACID, PLASMA
Lactic Acid, Venous: 2.2 mmol/L (ref 0.5–1.9)
Lactic Acid, Venous: 2.5 mmol/L (ref 0.5–1.9)
Lactic Acid, Venous: 1.7 mmol/L (ref 0.5–1.9)
Lactic Acid, Venous: 1.9 mmol/L (ref 0.5–1.9)

## 2021-09-24 LAB — ECHOCARDIOGRAM COMPLETE
AV Peak grad: 9.9 mmHg
Ao pk vel: 1.57 m/s
Area-P 1/2: 4.6 cm2
Height: 66 in
S' Lateral: 3.1 cm
Weight: 2880 oz

## 2021-09-24 LAB — SEDIMENTATION RATE: Sed Rate: 63 mm/hr — ABNORMAL HIGH (ref 0–20)

## 2021-09-24 LAB — PROCALCITONIN: Procalcitonin: 0.85 ng/mL

## 2021-09-24 LAB — C-REACTIVE PROTEIN: CRP: 26.8 mg/dL — ABNORMAL HIGH (ref <1.0)

## 2021-09-24 LAB — RESP PANEL BY RT-PCR (FLU A&B, COVID) ARPGX2
Influenza A by PCR: NEGATIVE
Influenza B by PCR: NEGATIVE
SARS Coronavirus 2 by RT PCR: NEGATIVE

## 2021-09-24 LAB — CK: Total CK: 149 U/L (ref 38–234)

## 2021-09-24 LAB — APTT: aPTT: 28 seconds (ref 24–36)

## 2021-09-24 LAB — HIV ANTIBODY (ROUTINE TESTING W REFLEX): HIV Screen 4th Generation wRfx: NONREACTIVE

## 2021-09-24 IMAGING — DX DG CHEST 1V PORT
1 series · 1 of 1 positions shown · non-contrast
Comparison: [DATE]

CLINICAL DATA: Positive culture.  Possible sepsis.

EXAM:
PORTABLE CHEST 1 VIEW

[chest ap]
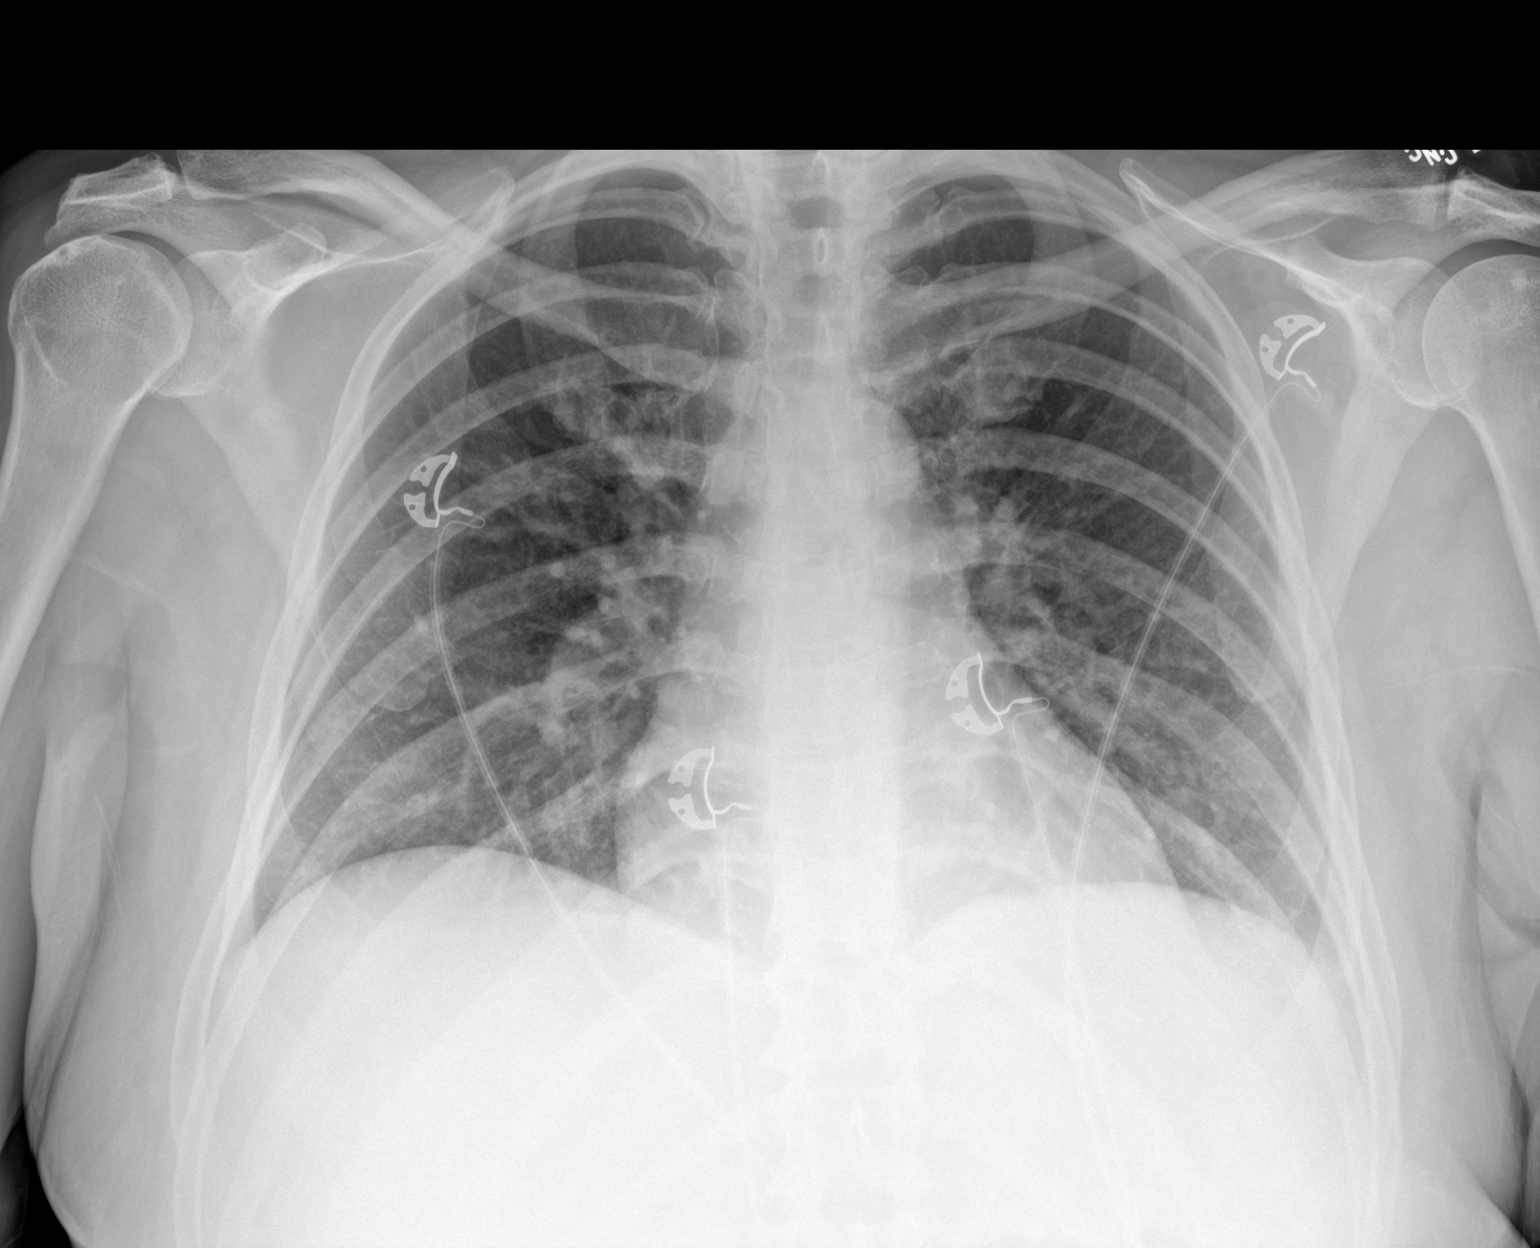

[1 of 1 positions shown; findings below may reference images not displayed]

FINDINGS: Midline trachea. Normal heart size. No pleural effusion or
pneumothorax. Pulmonary interstitial prominence is favored to be
within normal variation, given low lung volumes and AP portable
technique. No lobar consolidation.
IMPRESSION: Low lung volumes, without acute disease.

## 2021-09-24 MED ORDER — SODIUM CHLORIDE 0.9 % IV BOLUS
1500.0000 mL | Freq: Once | INTRAVENOUS | Status: AC
Start: 2021-09-24 — End: 2021-09-24
  Administered 2021-09-24: 1500 mL via INTRAVENOUS

## 2021-09-24 MED ORDER — SODIUM CHLORIDE 0.9 % IV BOLUS
500.0000 mL | Freq: Once | INTRAVENOUS | Status: AC
Start: 2021-09-24 — End: 2021-09-24
  Administered 2021-09-24: 500 mL via INTRAVENOUS

## 2021-09-24 MED ORDER — HEPARIN SODIUM (PORCINE) 5000 UNIT/ML IJ SOLN
5000.0000 [IU] | Freq: Three times a day (TID) | INTRAMUSCULAR | Status: DC
  Administered 2021-09-24 – 2021-09-25 (×3): 5000 [IU] via SUBCUTANEOUS
  Filled 2021-09-24 (×3): qty 1

## 2021-09-24 MED ORDER — OXYCODONE-ACETAMINOPHEN 5-325 MG PO TABS
1.0000 | ORAL_TABLET | ORAL | Status: DC | PRN
  Administered 2021-09-24 – 2021-09-25 (×4): 1 via ORAL
  Filled 2021-09-24 (×5): qty 1

## 2021-09-24 MED ORDER — PERFLUTREN LIPID MICROSPHERE
1.0000 mL | INTRAVENOUS | Status: AC | PRN
  Administered 2021-09-24: 3 mL via INTRAVENOUS
  Filled 2021-09-24: qty 10

## 2021-09-24 MED ORDER — SODIUM CHLORIDE 0.9 % IV BOLUS
1000.0000 mL | Freq: Once | INTRAVENOUS | Status: AC
Start: 2021-09-24 — End: 2021-09-24
  Administered 2021-09-24: 1000 mL via INTRAVENOUS

## 2021-09-24 MED ORDER — SERTRALINE HCL 50 MG PO TABS
25.0000 mg | ORAL_TABLET | Freq: Every morning | ORAL | Status: DC
  Administered 2021-09-25 – 2021-09-26 (×2): 25 mg via ORAL
  Filled 2021-09-24 (×2): qty 1

## 2021-09-24 MED ORDER — SODIUM CHLORIDE 0.9 % IV SOLN: INTRAVENOUS | Status: DC

## 2021-09-24 MED ORDER — NICOTINE 21 MG/24HR TD PT24
21.0000 mg | MEDICATED_PATCH | Freq: Every day | TRANSDERMAL | Status: DC
  Administered 2021-09-24 – 2021-09-26 (×3): 21 mg via TRANSDERMAL
  Filled 2021-09-24 (×3): qty 1

## 2021-09-24 MED ORDER — FAMOTIDINE IN NACL 20-0.9 MG/50ML-% IV SOLN
20.0000 mg | Freq: Two times a day (BID) | INTRAVENOUS | Status: DC | PRN
  Administered 2021-09-24 – 2021-09-25 (×2): 20 mg via INTRAVENOUS
  Filled 2021-09-24 (×2): qty 50

## 2021-09-24 MED ORDER — ONDANSETRON HCL 4 MG/2ML IJ SOLN
4.0000 mg | Freq: Three times a day (TID) | INTRAMUSCULAR | Status: DC | PRN
  Administered 2021-09-24 – 2021-09-26 (×6): 4 mg via INTRAVENOUS
  Filled 2021-09-24 (×6): qty 2

## 2021-09-24 MED ORDER — FAMOTIDINE IN NACL 20-0.9 MG/50ML-% IV SOLN
20.0000 mg | Freq: Two times a day (BID) | INTRAVENOUS | Status: DC
Start: 2021-09-24 — End: 2021-09-24

## 2021-09-24 MED ORDER — VANCOMYCIN HCL 1000 MG/200ML IV SOLN
1000.0000 mg | Freq: Two times a day (BID) | INTRAVENOUS | Status: DC
  Administered 2021-09-24 – 2021-09-26 (×4): 1000 mg via INTRAVENOUS
  Filled 2021-09-24 (×7): qty 200

## 2021-09-24 MED ORDER — LACTATED RINGERS IV SOLN: INTRAVENOUS | Status: AC

## 2021-09-24 MED ORDER — VANCOMYCIN HCL 2000 MG/400ML IV SOLN
2000.0000 mg | Freq: Once | INTRAVENOUS | Status: AC
  Administered 2021-09-24: 2000 mg via INTRAVENOUS
  Filled 2021-09-24: qty 400

## 2021-09-24 MED ORDER — CYCLOBENZAPRINE HCL 10 MG PO TABS
10.0000 mg | ORAL_TABLET | Freq: Three times a day (TID) | ORAL | Status: DC | PRN
  Administered 2021-09-24: 10 mg via ORAL
  Filled 2021-09-24: qty 1

## 2021-09-24 MED ORDER — IBUPROFEN 400 MG PO TABS
400.0000 mg | ORAL_TABLET | Freq: Once | ORAL | Status: AC
  Administered 2021-09-24: 400 mg via ORAL
  Filled 2021-09-24: qty 1

## 2021-09-24 MED ORDER — VANCOMYCIN VARIABLE DOSE PER UNSTABLE RENAL FUNCTION (PHARMACIST DOSING): Status: DC

## 2021-09-24 MED ORDER — PIPERACILLIN-TAZOBACTAM 3.375 G IVPB 30 MIN
3.3750 g | Freq: Once | INTRAVENOUS | Status: AC
Start: 2021-09-24 — End: 2021-09-24
  Administered 2021-09-24: 3.375 g via INTRAVENOUS
  Filled 2021-09-24: qty 50

## 2021-09-24 MED ORDER — ACETAMINOPHEN 325 MG PO TABS
650.0000 mg | ORAL_TABLET | Freq: Four times a day (QID) | ORAL | Status: DC | PRN
  Administered 2021-09-25 – 2021-09-26 (×2): 650 mg via ORAL
  Filled 2021-09-24 (×3): qty 2

## 2021-09-24 NOTE — Sepsis Progress Note (Signed)
Requested third LA from provider 

## 2021-09-24 NOTE — Plan of Care (Signed)
?  Problem: Clinical Measurements: Goal: Ability to maintain clinical measurements within normal limits will improve Outcome: Progressing Goal: Will remain free from infection Outcome: Progressing Goal: Diagnostic test results will improve Outcome: Progressing Goal: Respiratory complications will improve Outcome: Progressing Goal: Cardiovascular complication will be avoided Outcome: Progressing   Problem: Nutrition: Goal: Adequate nutrition will be maintained Outcome: Progressing   Problem: Elimination: Goal: Will not experience complications related to bowel motility Outcome: Progressing Goal: Will not experience complications related to urinary retention Outcome: Progressing   Problem: Pain Managment: Goal: General experience of comfort will improve Outcome: Progressing   Problem: Safety: Goal: Ability to remain free from injury will improve Outcome: Progressing   Problem: Skin Integrity: Goal: Risk for impaired skin integrity will decrease Outcome: Progressing   

## 2021-09-24 NOTE — Progress Notes (Signed)
CODE SEPSIS - PHARMACY COMMUNICATION ? ?**Broad Spectrum Antibiotics should be administered within 1 hour of Sepsis diagnosis** ? ?Time Code Sepsis Called/Page Received: [id number] ? ?Antibiotics Ordered: Zosyn + vancomycin ? ?Time of 1st antibiotic administration: 1053 ? ?Additional action taken by pharmacy: N/A ? ?Benita Gutter ?09/24/2021  11:48 AM  ?

## 2021-09-24 NOTE — ED Triage Notes (Signed)
Pt arrives via POV for a positive blood culture- pt was seen yesterday for joint pain and discharged with oral antibiotics and pain meds- pt was called this AM and was told her blood culture came back positive ?

## 2021-09-24 NOTE — Consult Note (Signed)
Pharmacy Antibiotic Note ? ?Jill Cox is a 46 y.o. female admitted on 09/24/2021 with MRSA bacteremia. Patient presented to ED 09/23/21 with CC of joint aches that originally started with upper back pain 1 week PTA. Pain progressed to bilateral thigh pain a few days later. Patient and with swelling to bilateral ankles and wrists upon ED visiti 09/23/21. Patient was discharged on oral cephalexin prescription. Blood cultures (single) set taken in ED resulted with MRSA in 2/2 and patient notified to return to hospital. Pharmacy has been consulted for Vancomycin dosing. ? ?Plan: ?Vancomycin 2000 mg x 1 LD given in ED.  ?Initiate Vancomycin 1000 mg Q12H. Goal AUC 400-600 ?Estimated AUC 577/Cmin: 17.7 ?Scr 0.99, IBW, Vd 0.72 (BMI 28.9) ?Vancomycin levels at steady state ? ?Height: 5\' 6"  (167.6 cm) ?Weight: 81.6 kg (180 lb) ?IBW/kg (Calculated) : 59.3 ? ?Temp (24hrs), Avg:97.9 ?F (36.6 ?C), Min:97.7 ?F (36.5 ?C), Max:98.2 ?F (36.8 ?C) ? ?Recent Labs  ?Lab 09/17/21 ?1923 09/23/21 ?1207 09/24/21 ?1036  ?WBC 4.1 16.0* 17.3*  ?CREATININE 0.88 1.27* 0.99  ?LATICACIDVEN  --   --  2.2*  ?  ?Estimated Creatinine Clearance: 75.6 mL/min (by C-G formula based on SCr of 0.99 mg/dL).   ? ?No Known Allergies ? ?Antimicrobials this admission: ?3/12 Zosyn >> x 1  ?3/12 Vancomycin >>  ? ?Dose adjustments this admission: ? ? ?Microbiology results: ?3/11 BCx: 2/2 MRSA ?3/12 Bcx: pending ? ? ?Thank you for allowing pharmacy to be a part of this patient?s care. ? ?Dorothe Pea, PharmD, BCPS ?Clinical Pharmacist   ?09/24/2021 12:26 PM ? ?

## 2021-09-24 NOTE — TOC CM/SW Note (Signed)
?  Transition of Care (TOC) Screening Note ? ? ?Patient Details  ?Name: Jill Cox ?Date of Birth: 1976/04/10 ? ? ?Transition of Care (TOC) CM/SW Contact:    ?Dagoberto Nealy E Sharissa Brierley, LCSW ?Phone Number: ?09/24/2021, 4:11 PM ? ? ? ?Transition of Care Department [facility]) has reviewed patient and no TOC needs have been identified at this time. We will continue to monitor patient advancement through interdisciplinary progression rounds. If new patient transition needs arise, please place a TOC consult. ? ? ?

## 2021-09-24 NOTE — Progress Notes (Signed)
PHARMACY - PHYSICIAN COMMUNICATION ?CRITICAL VALUE ALERT - BLOOD CULTURE IDENTIFICATION (BCID) ? ?Jill Cox is an 46 y.o. female who presented to [facility] on 09/24/2021 with a chief complaint of multiple joint pain and positive blood culture for MRSA ? ?Assessment:  4/4 MRSA  ? ?Name of physician (or Provider) Contacted: not required, condition already known and on appropriate antibiotics ? ?Current antibiotics: IV vancomycin ? ?Changes to prescribed antibiotics recommended:  ? ?Patient is on recommended antibiotics - No changes needed ? ?Results for orders placed or performed during the hospital encounter of 09/23/21  ?Blood Culture ID Panel (Reflexed) (Collected: 09/23/2021  1:53 PM)  ?Result Value Ref Range  ? Enterococcus faecalis NOT DETECTED NOT DETECTED  ? Enterococcus Faecium NOT DETECTED NOT DETECTED  ? Listeria monocytogenes NOT DETECTED NOT DETECTED  ? Staphylococcus species DETECTED (A) NOT DETECTED  ? Staphylococcus aureus (BCID) DETECTED (A) NOT DETECTED  ? Staphylococcus epidermidis NOT DETECTED NOT DETECTED  ? Staphylococcus lugdunensis NOT DETECTED NOT DETECTED  ? Streptococcus species NOT DETECTED NOT DETECTED  ? Streptococcus agalactiae NOT DETECTED NOT DETECTED  ? Streptococcus pneumoniae NOT DETECTED NOT DETECTED  ? Streptococcus pyogenes NOT DETECTED NOT DETECTED  ? A.calcoaceticus-baumannii NOT DETECTED NOT DETECTED  ? Bacteroides fragilis NOT DETECTED NOT DETECTED  ? Enterobacterales NOT DETECTED NOT DETECTED  ? Enterobacter cloacae complex NOT DETECTED NOT DETECTED  ? Escherichia coli NOT DETECTED NOT DETECTED  ? Klebsiella aerogenes NOT DETECTED NOT DETECTED  ? Klebsiella oxytoca NOT DETECTED NOT DETECTED  ? Klebsiella pneumoniae NOT DETECTED NOT DETECTED  ? Proteus species NOT DETECTED NOT DETECTED  ? Salmonella species NOT DETECTED NOT DETECTED  ? Serratia marcescens NOT DETECTED NOT DETECTED  ? Haemophilus influenzae NOT DETECTED NOT DETECTED  ? Neisseria meningitidis NOT DETECTED NOT  DETECTED  ? Pseudomonas aeruginosa NOT DETECTED NOT DETECTED  ? Stenotrophomonas maltophilia NOT DETECTED NOT DETECTED  ? Candida albicans NOT DETECTED NOT DETECTED  ? Candida auris NOT DETECTED NOT DETECTED  ? Candida glabrata NOT DETECTED NOT DETECTED  ? Candida krusei NOT DETECTED NOT DETECTED  ? Candida parapsilosis NOT DETECTED NOT DETECTED  ? Candida tropicalis NOT DETECTED NOT DETECTED  ? Cryptococcus neoformans/gattii NOT DETECTED NOT DETECTED  ? Meth resistant mecA/C and MREJ DETECTED (A) NOT DETECTED  ? ? ?Lowella Bandy ?09/24/2021  11:35 PM ? ?

## 2021-09-24 NOTE — Sepsis Progress Note (Signed)
Sepsis protocol is being followed by [facility]. 

## 2021-09-24 NOTE — H&P (Addendum)
History and Physical    Jill Cox CHY:[id number] DOB: [birth year] DOA: 09/24/2021  Referring MD/NP/PA:   PCP: Pcp, No   Patient coming from:  The patient is coming from home.  At baseline, pt is independent for most of ADL.        Chief Complaint: Multiple joint pain and positive blood culture for MRSA   HPI: Jill Cox is a 46 y.o. female with medical history significant of depression, vaping, who presents with multiple joint pain and a positive blood culture for MRSA.  Pt states that she had muscle pain below her right shoulder blade. She was seen in ED on 3/5 and had local trigger point injection of 5 cc of 0.25% bupivicaine. She states that her muscle pain has resolved.  She developed multiple joint pain 4 days ago, including bilateral wrist, bilateral knee, right ankle.  She also has pain in bilateral upper thigh.  The pain is constant, moderate, aching, nonradiating.  Patient does not have fever or chills.  Patient was started on prednisone 3 days ago by PCP, without significant improvement. She was seen in ED again on yesterday 3/11, and started on Keflex, still without significant improvement. Today patient was called back due to positive blood culture for MRSA.  Patient does not have chest pain, cough, shortness breath.  Patient has nausea, no vomiting, diarrhea or abdominal pain.  No symptoms of UTI.  Data Reviewed and ED Course: pt was found to have WBC 17.3, lactic acid 2.2 --> 2.5, procalcitonin 0.85, CK 267 --> 149, INR 1.1, PTT 28, negative COVID PCR, pending CK level, creatinine 1.27, BUN 32, GFR > 60, temperature normal, blood pressure 130/74, heart rate 99, RR 20, oxygen saturation 96% on room air.  Chest x-ray negative.  Patient is admitted to [facility] bed as inpatient.  Consulted infectious disease.  EKG: I have personally reviewed.  Sinus rhythm, QTc 430, early R wave progression, nonspecific T wave change   Review of Systems:   General: no fevers, chills, no body  weight gain, has poor appetite, has fatigue HEENT: no blurry vision, hearing changes or sore throat Respiratory: no dyspnea, coughing, wheezing CV: no chest pain, no palpitations GI: has nausea, no vomiting, abdominal pain, diarrhea, constipation GU: no dysuria, burning on urination, increased urinary frequency, hematuria  Ext: no leg edema Neuro: no unilateral weakness, numbness, or tingling, no vision change or hearing loss Skin: no rash, no skin tear. MSK: Has multiple joint pain Heme: No easy bruising.  Travel history: No recent long distant travel.   Allergy: No Known Allergies  Past Medical History:  Diagnosis Date   Depression    Engages in vaping     Past Surgical History:  Procedure Laterality Date   CHOLECYSTECTOMY      Social History:  reports that she has never smoked. She has never used smokeless tobacco. She reports that she does not currently use alcohol. She reports that she does not use drugs.  Family History:  Family History  Problem Relation Age of Onset   Heart disease Mother    Heart disease Father      Prior to Admission medications   Medication Sig Start Date End Date Taking? Authorizing Provider  cephALEXin (KEFLEX) 500 MG capsule Take 1 capsule (500 mg total) by mouth 4 (four) times daily for 5 days. 09/23/21 09/28/21  Rada Hay, MD  cyclobenzaprine (FLEXERIL) 10 MG tablet Take 1 tablet (10 mg total) by mouth 3 (three) times daily as needed for muscle  spasms. 09/17/21   Duffy Bruce, MD  naproxen (NAPROSYN) 500 MG tablet Take 1 tablet (500 mg total) by mouth 2 (two) times daily with a meal for 7 days. 09/17/21 09/24/21  Duffy Bruce, MD  naproxen (NAPROSYN) 500 MG tablet Take 1 tablet (500 mg total) by mouth 2 (two) times daily with a meal for 10 days. 09/23/21 10/03/21  Rada Hay, MD  oxyCODONE-acetaminophen (PERCOCET) 5-325 MG tablet Take 1 tablet by mouth every 4 (four) hours as needed for up to 5 days for severe pain. 09/23/21  09/28/21  Rada Hay, MD  predniSONE (DELTASONE) 20 MG tablet Take 3 tablets (60 mg total) by mouth daily with breakfast for 5 days. 09/23/21 09/28/21  Rada Hay, MD  sertraline (ZOLOFT) 25 MG tablet Take 25 mg by mouth every morning. 09/18/21   [provider]    Physical Exam: Vitals:   09/24/21 1009 09/24/21 1011 09/24/21 1126 09/24/21 1219  BP:  130/74 136/75 121/66  Pulse:  92 83 81  Resp:  20 18 20   Temp:  98.2 F (36.8 C) 97.7 F (36.5 C) 97.8 F (36.6 C)  TempSrc:  Oral Oral   SpO2:  100% 100% 99%  Weight: 81.6 kg     Height: 5' 6"  (1.676 m)      General: Not in acute distress HEENT:       Eyes: PERRL, EOMI, no scleral icterus.       ENT: No discharge from the ears and nose, no pharynx injection, no tonsillar enlargement.        Neck: No JVD, no bruit, no mass felt. Heme: No neck lymph node enlargement. Cardiac: S1/S2, RRR, No murmurs, No gallops or rubs. Respiratory: No rales, wheezing, rhonchi or rubs. GI: Soft, nondistended, nontender, no rebound pain, no organomegaly, BS present. GU: No hematuria Ext: No pitting leg edema bilaterally. 1+DP/PT pulse bilaterally. Musculoskeletal: has had erythema, mild swelling, tenderness in multiple joints, including bilateral wrist, bilateral knee, right ankle. Skin: No rashes.  Neuro: Alert, oriented X3, cranial nerves II-XII grossly intact, moves all extremities normally. Psych: Patient is not psychotic, no suicidal or hemocidal ideation.  Labs on Admission: I have personally reviewed following labs and imaging studies  CBC: Recent Labs  Lab 09/17/21 1923 09/23/21 1207 09/24/21 1036  WBC 4.1 16.0* 17.3*  NEUTROABS  --  14.9* 15.4*  HGB 14.6 14.1 12.5  HCT 42.6 42.1 38.2  MCV 90.1 90.5 92.7  PLT 125* 130* 449   Basic Metabolic Panel: Recent Labs  Lab 09/17/21 1923 09/23/21 1207 09/24/21 1036  NA 140 137 139  K 3.8 3.6 3.5  CL 104 104 103  CO2 26 22 26   GLUCOSE 108* 108* 110*  BUN 23* 32*  32*  CREATININE 0.88 1.27* 0.99  CALCIUM 8.9 10.5* 9.8   GFR: Estimated Creatinine Clearance: 75.6 mL/min (by C-G formula based on SCr of 0.99 mg/dL). Liver Function Tests: Recent Labs  Lab 09/23/21 1207 09/24/21 1036  AST 54* 51*  ALT 59* 50*  ALKPHOS 121 119  BILITOT 1.2 1.1  PROT 6.5 6.1*  ALBUMIN 2.7* 2.4*   No results for input(s): LIPASE, AMYLASE in the last 168 hours. No results for input(s): AMMONIA in the last 168 hours. Coagulation Profile: Recent Labs  Lab 09/24/21 1036  INR 1.1   Cardiac Enzymes: Recent Labs  Lab 09/23/21 1207 09/24/21 1036  CKTOTAL 267* 149   BNP (last 3 results) No results for input(s): PROBNP in the last 8760 hours. HbA1C:  No results for input(s): HGBA1C in the last 72 hours. CBG: No results for input(s): GLUCAP in the last 168 hours. Lipid Profile: No results for input(s): CHOL, HDL, LDLCALC, TRIG, CHOLHDL, LDLDIRECT in the last 72 hours. Thyroid Function Tests: No results for input(s): TSH, T4TOTAL, FREET4, T3FREE, THYROIDAB in the last 72 hours. Anemia Panel: No results for input(s): VITAMINB12, FOLATE, FERRITIN, TIBC, IRON, RETICCTPCT in the last 72 hours. Urine analysis: No results found for: COLORURINE, APPEARANCEUR, LABSPEC, [place], GLUCOSEU, HGBUR, BILIRUBINUR, KETONESUR, [place], UROBILINOGEN, NITRITE, LEUKOCYTESUR Sepsis Labs: @LABRCNTIP (procalcitonin:4,lacticidven:4) ) Recent Results (from the past 240 hour(s))  Blood culture (single)     Status: None (Preliminary result)   Collection Time: 09/23/21  1:53 PM   Specimen: BLOOD  Result Value Ref Range Status   Specimen Description BLOOD RIGHT ANTECUBITAL  Final   Special Requests   Final    BOTTLES DRAWN AEROBIC AND ANAEROBIC Blood Culture adequate volume   Culture  Setup Time   Final    Organism ID to follow IN BOTH AEROBIC AND ANAEROBIC BOTTLES GRAM POSITIVE COCCI CRITICAL RESULT CALLED TO, READ BACK BY AND VERIFIED WITH: Paulina Fusi [facility] AT 2706 09/24/2021  GAA Performed at [facility], [address] [place],  [address]    Culture GRAM POSITIVE COCCI  Final   Report Status PENDING  Incomplete  Blood Culture ID Panel (Reflexed)     Status: Abnormal   Collection Time: 09/23/21  1:53 PM  Result Value Ref Range Status   Enterococcus faecalis NOT DETECTED NOT DETECTED Final   Enterococcus Faecium NOT DETECTED NOT DETECTED Final   Listeria monocytogenes NOT DETECTED NOT DETECTED Final   Staphylococcus species DETECTED (A) NOT DETECTED Final    Comment: CRITICAL RESULT CALLED TO, READ BACK BY AND VERIFIED WITH: LISA KLUTTZ PHARMD AT 0522 09/24/2021 GAA    Staphylococcus aureus (BCID) DETECTED (A) NOT DETECTED Final    Comment: Methicillin (oxacillin)-resistant Staphylococcus aureus (MRSA). MRSA is predictably resistant to beta-lactam antibiotics (except ceftaroline). Preferred therapy is vancomycin unless clinically contraindicated. Patient requires contact precautions if  hospitalized. CRITICAL RESULT CALLED TO, READ BACK BY AND VERIFIED WITH: LISA KLUTTZ PHARMD AT 0522 09/24/2021 GAA    Staphylococcus epidermidis NOT DETECTED NOT DETECTED Final   Staphylococcus lugdunensis NOT DETECTED NOT DETECTED Final   Streptococcus species NOT DETECTED NOT DETECTED Final   Streptococcus agalactiae NOT DETECTED NOT DETECTED Final   Streptococcus pneumoniae NOT DETECTED NOT DETECTED Final   Streptococcus pyogenes NOT DETECTED NOT DETECTED Final   A.calcoaceticus-baumannii NOT DETECTED NOT DETECTED Final   Bacteroides fragilis NOT DETECTED NOT DETECTED Final   Enterobacterales NOT DETECTED NOT DETECTED Final   Enterobacter cloacae complex NOT DETECTED NOT DETECTED Final   Escherichia coli NOT DETECTED NOT DETECTED Final   Klebsiella aerogenes NOT DETECTED NOT DETECTED Final   Klebsiella oxytoca NOT DETECTED NOT DETECTED Final   Klebsiella pneumoniae NOT DETECTED NOT DETECTED Final   Proteus species NOT DETECTED NOT DETECTED Final    Salmonella species NOT DETECTED NOT DETECTED Final   Serratia marcescens NOT DETECTED NOT DETECTED Final   Haemophilus influenzae NOT DETECTED NOT DETECTED Final   Neisseria meningitidis NOT DETECTED NOT DETECTED Final   Pseudomonas aeruginosa NOT DETECTED NOT DETECTED Final   Stenotrophomonas maltophilia NOT DETECTED NOT DETECTED Final   Candida albicans NOT DETECTED NOT DETECTED Final   Candida auris NOT DETECTED NOT DETECTED Final   Candida glabrata NOT DETECTED NOT DETECTED Final   Candida krusei NOT DETECTED NOT DETECTED Final  Candida parapsilosis NOT DETECTED NOT DETECTED Final   Candida tropicalis NOT DETECTED NOT DETECTED Final   Cryptococcus neoformans/gattii NOT DETECTED NOT DETECTED Final   Meth resistant mecA/C and MREJ DETECTED (A) NOT DETECTED Final    Comment: CRITICAL RESULT CALLED TO, READ BACK BY AND VERIFIED WITH: Paulina Fusi [facility] AT 0522 09/24/2021 GAA Performed at [facility], [address] [place], [place] [address]   Resp Panel by RT-PCR (Flu A&B, Covid) Nasopharyngeal Swab     Status: None   Collection Time: 09/24/21 10:36 AM   Specimen: Nasopharyngeal Swab; Nasopharyngeal(NP) swabs in vial transport medium  Result Value Ref Range Status   SARS Coronavirus 2 by RT PCR NEGATIVE NEGATIVE Final    Comment: (NOTE) SARS-CoV-2 target nucleic acids are NOT DETECTED.  The SARS-CoV-2 RNA is generally detectable in upper respiratory specimens during the acute phase of infection. The lowest concentration of SARS-CoV-2 viral copies this assay can detect is 138 copies/mL. A negative result does not preclude SARS-Cov-2 infection and should not be used as the sole basis for treatment or other patient management decisions. A negative result may occur with  improper specimen collection/handling, submission of specimen other than nasopharyngeal swab, presence of viral mutation(s) within the areas targeted by this assay, and inadequate number of  viral copies(<138 copies/mL). A negative result must be combined with clinical observations, patient history, and epidemiological information. The expected result is Negative.  Fact Sheet for Patients:  EntrepreneurPulse.com.au  Fact Sheet for Healthcare Providers:  IncredibleEmployment.be  This test is no t yet approved or cleared by the [place] FDA and  has been authorized for detection and/or diagnosis of SARS-CoV-2 by FDA under an Emergency Use Authorization (EUA). This EUA will remain  in effect (meaning this test can be used) for the duration of the COVID-19 declaration under Section 564(b)(1) of the Act, 21 U.S.C.section 360bbb-3(b)(1), unless the authorization is terminated  or revoked sooner.       Influenza A by PCR NEGATIVE NEGATIVE Final   Influenza B by PCR NEGATIVE NEGATIVE Final    Comment: (NOTE) The Xpert Xpress SARS-CoV-2/FLU/RSV plus assay is intended as an aid in the diagnosis of influenza from Nasopharyngeal swab specimens and should not be used as a sole basis for treatment. Nasal washings and aspirates are unacceptable for Xpert Xpress SARS-CoV-2/FLU/RSV testing.  Fact Sheet for Patients: EntrepreneurPulse.com.au  Fact Sheet for Healthcare Providers: IncredibleEmployment.be  This test is not yet approved or cleared by the [place] FDA and has been authorized for detection and/or diagnosis of SARS-CoV-2 by FDA under an Emergency Use Authorization (EUA). This EUA will remain in effect (meaning this test can be used) for the duration of the COVID-19 declaration under Section 564(b)(1) of the Act, 21 U.S.C. section 360bbb-3(b)(1), unless the authorization is terminated or revoked.  Performed at [facility], [address] [place], [place] [address]      Radiological Exams on Admission: DG Chest [facility] 1 View  Result Date: 09/24/2021 CLINICAL DATA:  Positive  culture.  Possible sepsis. EXAM: PORTABLE CHEST 1 VIEW COMPARISON:  09/17/2021 FINDINGS: Midline trachea. Normal heart size. No pleural effusion or pneumothorax. Pulmonary interstitial prominence is favored to be within normal variation, given low lung volumes and AP portable technique. No lobar consolidation. IMPRESSION: Low lung volumes, without acute disease. Electronically Signed   By: Abigail Miyamoto M.D.   On: 09/24/2021 10:55      Assessment/Plan Principal Problem:   MRSA bacteremia Active Problems:   Polyarthritis  Sepsis ([place])   Hypercalcemia   Depression   Engages in vaping   Severe sepsis due to MRSA bacteremia 2/2 polyarthritis (likely septic arthritis): Patient has severe sepsis with WBC 17.3, heart rate 99, elevated lactic acid 2.2, 2.5.  Procalcitonin 0.85.  Currently patient is hemodynamically stable. Consulted Dr. Linus Salmons of ID, who agreed with treatment with vancomycin and get a 2D echo now.  They will see patient tomorrow.  -Admitted to MedSurg bed as inpatient -Vancomycin IV (patient received 1 dose of Zosyn in ED) -Blood culture -As needed Percocet, Tylenol -Check ESR and CRP -2D echo - Trend lactic acid levels per sepsis protocol. -IVF: 3L of NS bolus in ED, followed by 755 cc/h   Hypercalcemia: Mild elevation for calcium, 10.5.  Likely due to dehydration -on IVF F/u with BMP  Depression -zolfot  Engages in vaping -Nicotine patch      DVT ppx: SQ Heparin    Code Status: Full code  Family Communication:   Yes, patient's  husband  at bed side.      Disposition Plan:  Anticipate discharge back to previous environment  Consults called: Dr. Linus Salmons of ID  Admission status and Level of care: Med-Surg:    as inpt       Severity of Illness:  The appropriate patient status for this patient is INPATIENT. Inpatient status is judged to be reasonable and necessary in order to provide the required intensity of service to ensure the patient's safety. The  patient's presenting symptoms, physical exam findings, and initial radiographic and laboratory data in the context of their chronic comorbidities is felt to place them at high risk for further clinical deterioration. Furthermore, it is not anticipated that the patient will be medically stable for discharge from the hospital within 2 midnights of admission.   * I certify that at the point of admission it is my clinical judgment that the patient will require inpatient hospital care spanning beyond 2 midnights from the point of admission due to high intensity of service, high risk for further deterioration and high frequency of surveillance required.*       Date of Service 09/24/2021    Ivor Costa [facility]   If 7PM-7AM, please contact night-coverage [email or web address] 09/24/2021, 3:19 PM

## 2021-09-24 NOTE — Progress Notes (Signed)
*  PRELIMINARY RESULTS* ?Echocardiogram ?2D Echocardiogram has been performed. Definity IV Contrast (Imaging Enhancing Agent) used on this study. ? ?Jill Cox ?09/24/2021, 4:27 PM ?

## 2021-09-24 NOTE — ED Notes (Signed)
Morgan RN aware of assigned bed 

## 2021-09-24 NOTE — ED Provider Notes (Addendum)
? ?[facility] ?Provider Note ? ? ? Event Date/Time  ? First MD Initiated Contact with Patient 09/24/21 1015   ?  (approximate) ? ? ?History  ? ?Abnormal Lab ? ? ?HPI ? ?Jill Cox is a 46 y.o. female with no significant past medical history who was called back to the emergency department because of Staph aureus growing in blood culture bottles from yesterday.  Patient was seen yesterday for thigh pain and swelling as well as wrist pain and swelling.  No history of IV drug abuse.  No history of rheumatologic disorder.  Afebrile.  Continues to have pain in her thighs bilaterally primarily her right thigh.  No injury to the area. ? ?  ? ? ?Physical Exam  ? ?Triage Vital Signs: ?ED Triage Vitals  ?Enc Vitals Group  ?   BP 09/24/21 1011 130/74  ?   Pulse Rate 09/24/21 1011 92  ?   Resp 09/24/21 1011 20  ?   Temp 09/24/21 1011 98.2 ?F (36.8 ?C)  ?   Temp Source 09/24/21 1011 Oral  ?   SpO2 09/24/21 1011 100 %  ?   Weight 09/24/21 1009 81.6 kg (180 lb)  ?   Height 09/24/21 1009 1.676 m (5\' 6" )  ?   Head Circumference --   ?   Peak Flow --   ?   Pain Score 09/24/21 1009 6  ?   Pain Loc --   ?   Pain Edu? --   ?   Excl. in [place]? --   ? ? ?Most recent vital signs: ?Vitals:  ? 09/24/21 1011  ?BP: 130/74  ?Pulse: 92  ?Resp: 20  ?Temp: 98.2 ?F (36.8 ?C)  ?SpO2: 100%  ? ? ? ?General: Awake, no distress.  ?CV:  Good peripheral perfusion.  No tachycardia ?Resp:  Normal effort.  ?Abd:  No distention.  ?Other:  Right thigh, mild erythema, swelling, left thigh mild swelling no erythema.  Small mount of erythema dorsally on the right wrist and left wrist although no wrist swelling or painful range of motion.  No rash noted ? ? ?ED Results / Procedures / Treatments  ? ?Labs ?(all labs ordered are listed, but only abnormal results are displayed) ?Labs Reviewed  ?CBC WITH DIFFERENTIAL/PLATELET - Abnormal; Notable for the following components:  ?    Result Value  ? WBC 17.3 (*)   ? All other components within normal  limits  ?RESP PANEL BY RT-PCR (FLU A&B, COVID) [id number]  ?CULTURE, BLOOD (ROUTINE X 2)  ?CULTURE, BLOOD (ROUTINE X 2)  ?URINE CULTURE  ?PROTIME-INR  ?APTT  ?LACTIC ACID, PLASMA  ?LACTIC ACID, PLASMA  ?COMPREHENSIVE METABOLIC PANEL  ?URINALYSIS, COMPLETE (UACMP) WITH MICROSCOPIC  ?CK  ?POC URINE PREG, ED  ? ? ? ?EKG ? ?ED ECG REPORT ?I, Lavonia Drafts, the attending physician, personally viewed and interpreted this ECG. ? ?Date: 09/24/2021 ? ?Rhythm: normal sinus rhythm ?QRS Axis: normal ?Intervals: normal ?ST/T Wave abnormalities: normal ?Narrative Interpretation: no evidence of acute ischemia ? ? ? ?RADIOLOGY ?Chest x-ray viewed and interpreted by me, no acute abnormality ? ? ? ?PROCEDURES: ? ?Critical Care performed: yes ? ?CRITICAL CARE ?Performed by: Lavonia Drafts ? ? ?Total critical care time: 30 minutes ? ?Critical care time was exclusive of separately billable procedures and treating other patients. ? ?Critical care was necessary to treat or prevent imminent or life-threatening deterioration. ? ?Critical care was time spent personally by me on the following activities: development of treatment plan with  patient and/or surrogate as well as nursing, discussions with consultants, evaluation of patient's response to treatment, examination of patient, obtaining history from patient or surrogate, ordering and performing treatments and interventions, ordering and review of laboratory studies, ordering and review of radiographic studies, pulse oximetry and re-evaluation of patient's condition. ? ? ?Procedures ? ? ?MEDICATIONS ORDERED IN ED: ?Medications  ?lactated ringers infusion ( Intravenous New Bag/Given 09/24/21 1052)  ?piperacillin-tazobactam (ZOSYN) IVPB 3.375 g (3.375 g Intravenous New Bag/Given 09/24/21 1053)  ?vancomycin (VANCOREADY) IVPB 2000 mg/400 mL (2,000 mg Intravenous New Bag/Given 09/24/21 1054)  ?vancomycin variable dose per unstable renal function (pharmacist dosing) (has no administration in time  range)  ?sodium chloride 0.9 % bolus 500 mL (500 mLs Intravenous New Bag/Given 09/24/21 1053)  ?ibuprofen (ADVIL) tablet 400 mg (400 mg Oral Given 09/24/21 1054)  ? ? ? ?IMPRESSION / MDM / ASSESSMENT AND PLAN / ED COURSE  ?I reviewed the triage vital signs and the nursing notes. ? ?Patient presents with swelling in the thighs and wrist area bilaterally with mild erythema.  Seen yesterday blood culture drawn which grew out MRSA, patient was called back and ? ?Yesterday her CK was normal, she did have a mildly elevated white blood cell count however afebrile, no tachycardia. ? ?Unclear cause of her symptoms, no history of valvular disease reported.,  No IV drug abuse. ? ?Endocarditis is a strong possibility  will treat with IV vancomycin and IV Zosyn, code sepsis activated ? ?Differential also includes rheumatologic disorder. ? ?We will consult the hospitalist for admission ? ? ? ? ? ?  ? ? ?FINAL CLINICAL IMPRESSION(S) / ED DIAGNOSES  ? ?Final diagnoses:  ?MRSA bacteremia  ? ? ? ?Rx / DC Orders  ? ?ED Discharge Orders   ? ? None  ? ?  ? ? ? ?Note:  This document was prepared using Dragon voice recognition software and may include unintentional dictation errors. ?  ?Lavonia Drafts, MD ?09/24/21 1041 ? ?  ?Lavonia Drafts, MD ?09/24/21 1107 ? ?

## 2021-09-25 ENCOUNTER — Inpatient Hospital Stay: Payer: 59

## 2021-09-25 DIAGNOSIS — M00032 Staphylococcal arthritis, left wrist: Secondary | ICD-10-CM

## 2021-09-25 DIAGNOSIS — B9562 Methicillin resistant Staphylococcus aureus infection as the cause of diseases classified elsewhere: Secondary | ICD-10-CM | POA: Diagnosis not present

## 2021-09-25 DIAGNOSIS — M00031 Staphylococcal arthritis, right wrist: Secondary | ICD-10-CM

## 2021-09-25 DIAGNOSIS — M00071 Staphylococcal arthritis, right ankle and foot: Secondary | ICD-10-CM

## 2021-09-25 DIAGNOSIS — R7881 Bacteremia: Secondary | ICD-10-CM | POA: Diagnosis not present

## 2021-09-25 DIAGNOSIS — M4620 Osteomyelitis of vertebra, site unspecified: Secondary | ICD-10-CM

## 2021-09-25 DIAGNOSIS — M60009 Infective myositis, unspecified site: Secondary | ICD-10-CM

## 2021-09-25 DIAGNOSIS — M65051 Abscess of tendon sheath, right thigh: Secondary | ICD-10-CM

## 2021-09-25 DIAGNOSIS — L02416 Cutaneous abscess of left lower limb: Secondary | ICD-10-CM

## 2021-09-25 LAB — BASIC METABOLIC PANEL
Anion gap: 9 (ref 5–15)
BUN: 23 mg/dL — ABNORMAL HIGH (ref 6–20)
CO2: 22 mmol/L (ref 22–32)
Calcium: 8.5 mg/dL — ABNORMAL LOW (ref 8.9–10.3)
Chloride: 109 mmol/L (ref 98–111)
Creatinine, Ser: 0.87 mg/dL (ref 0.44–1.00)
GFR, Estimated: >60 mL/min (ref >60)
Glucose, Bld: 117 mg/dL — ABNORMAL HIGH (ref 70–99)
Potassium: 3.9 mmol/L (ref 3.5–5.1)
Sodium: 140 mmol/L (ref 135–145)

## 2021-09-25 LAB — CBC
HCT: 34.6 % — ABNORMAL LOW (ref 36.0–46.0)
Hemoglobin: 11.3 g/dL — ABNORMAL LOW (ref 12.0–15.0)
MCH: 30.1 pg (ref 26.0–34.0)
MCHC: 32.7 g/dL (ref 30.0–36.0)
MCV: 92.3 fL (ref 80.0–100.0)
Platelets: 151 10*3/uL (ref 150–400)
RBC: 3.75 MIL/uL — ABNORMAL LOW (ref 3.87–5.11)
RDW: 14.1 % (ref 11.5–15.5)
WBC: 11.3 10*3/uL — ABNORMAL HIGH (ref 4.0–10.5)
nRBC: 0 % (ref 0.0–0.2)

## 2021-09-25 LAB — LYME DISEASE SEROLOGY W/REFLEX: Lyme Total Antibody EIA: NEGATIVE

## 2021-09-25 IMAGING — DX DG KNEE 3 VIEWS*R*
3 series · 3 of 3 positions shown · non-contrast
Comparison: None.

CLINICAL DATA: Pain right knee

EXAM:
RIGHT KNEE - 3 VIEW

[knee ap]
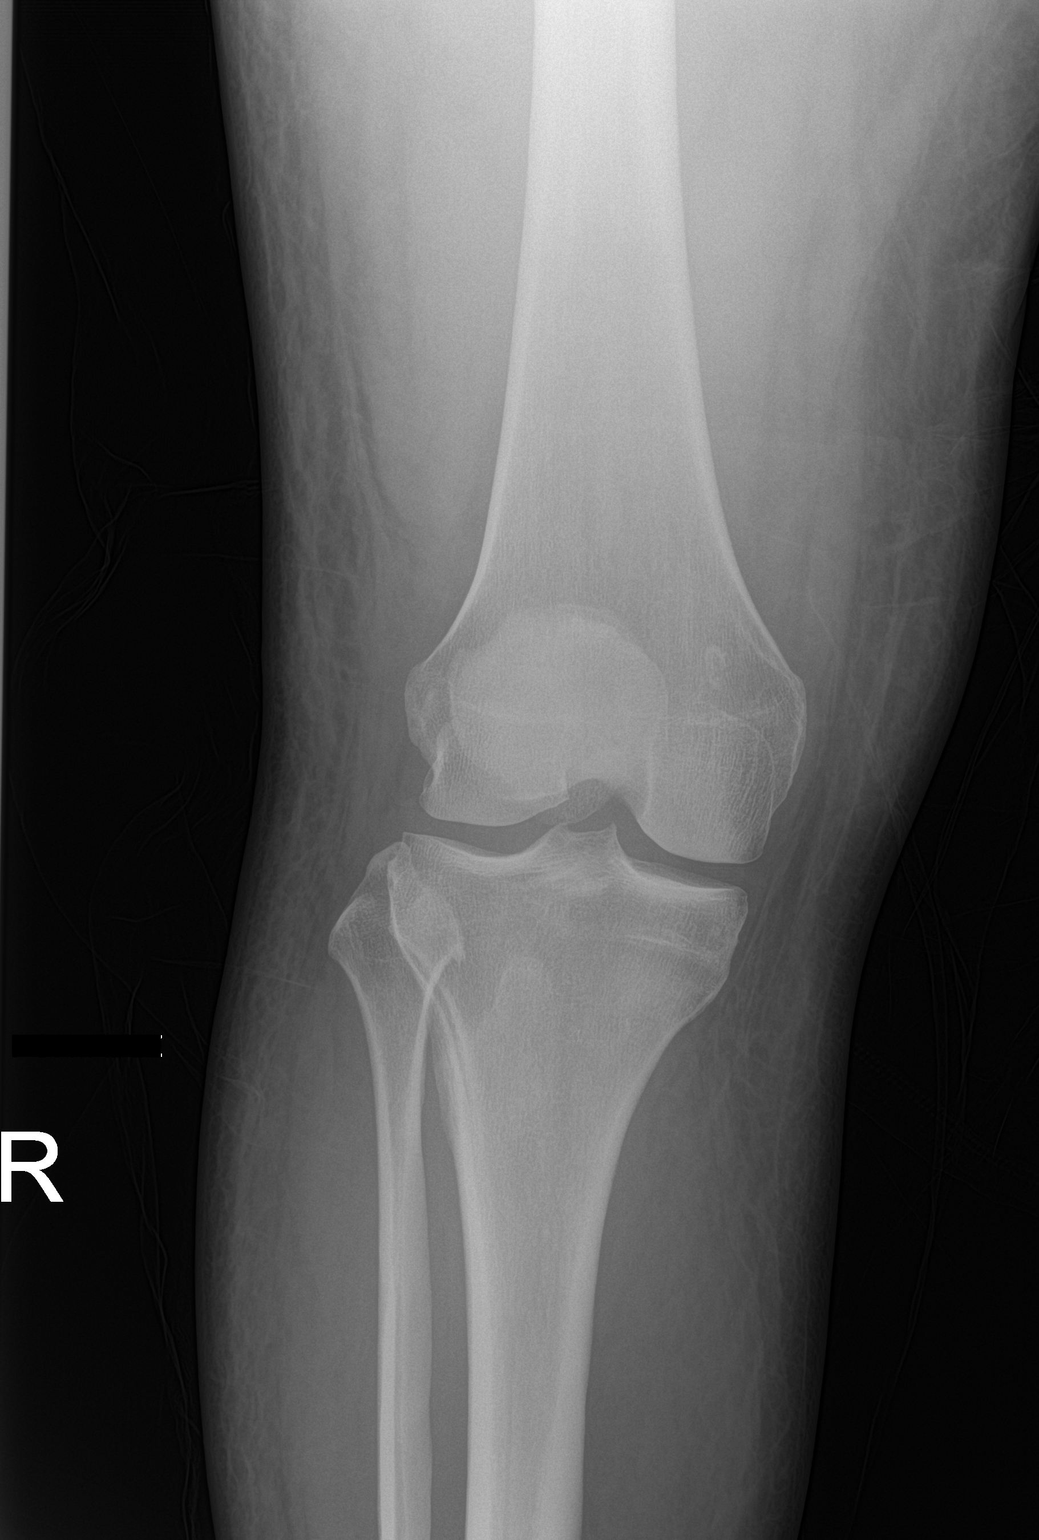

[knee lat]
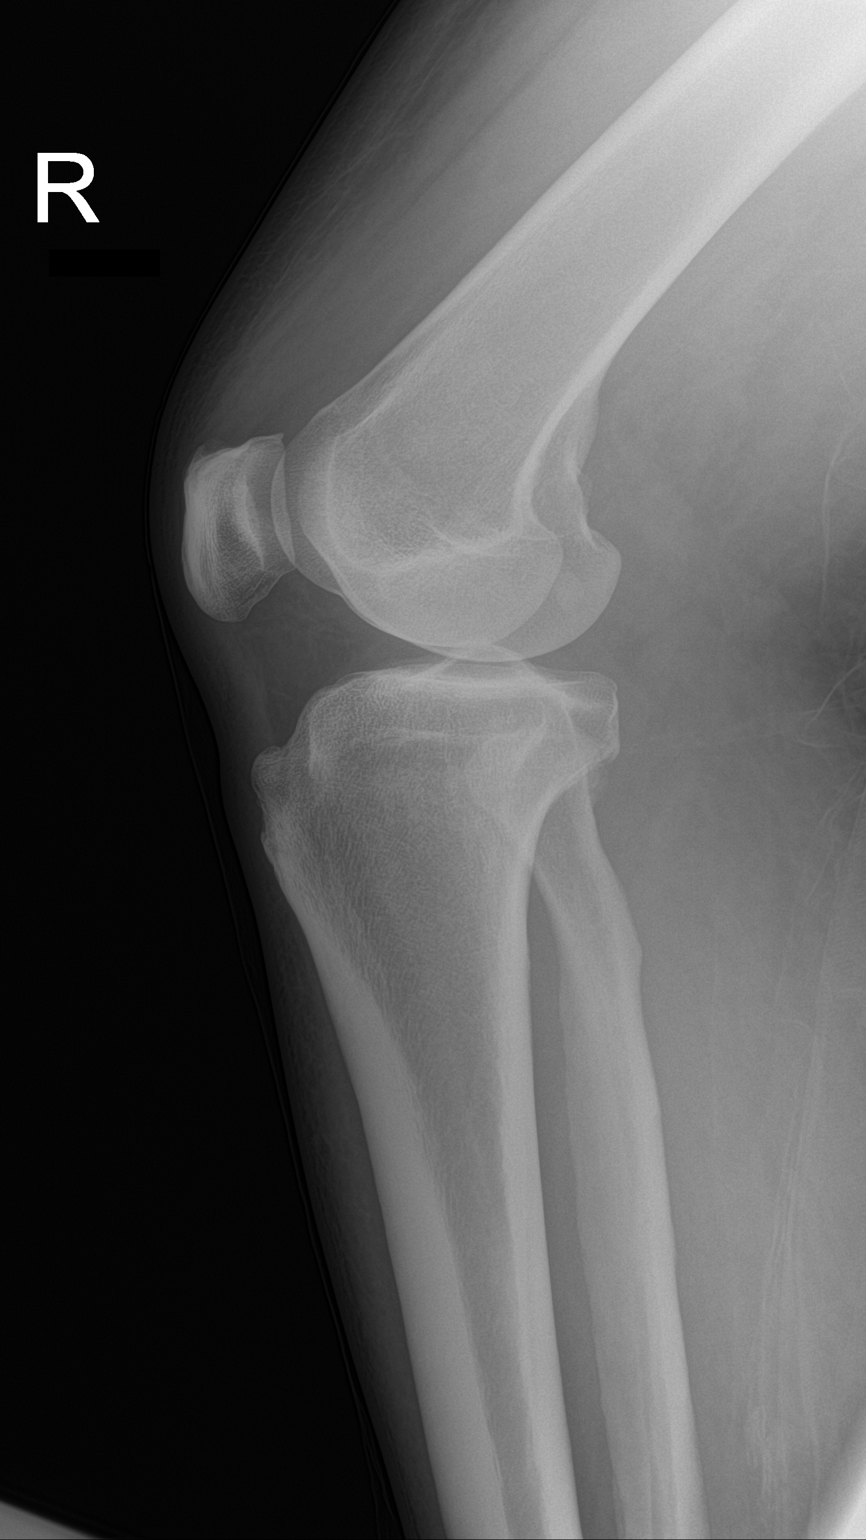

[patella skyline]
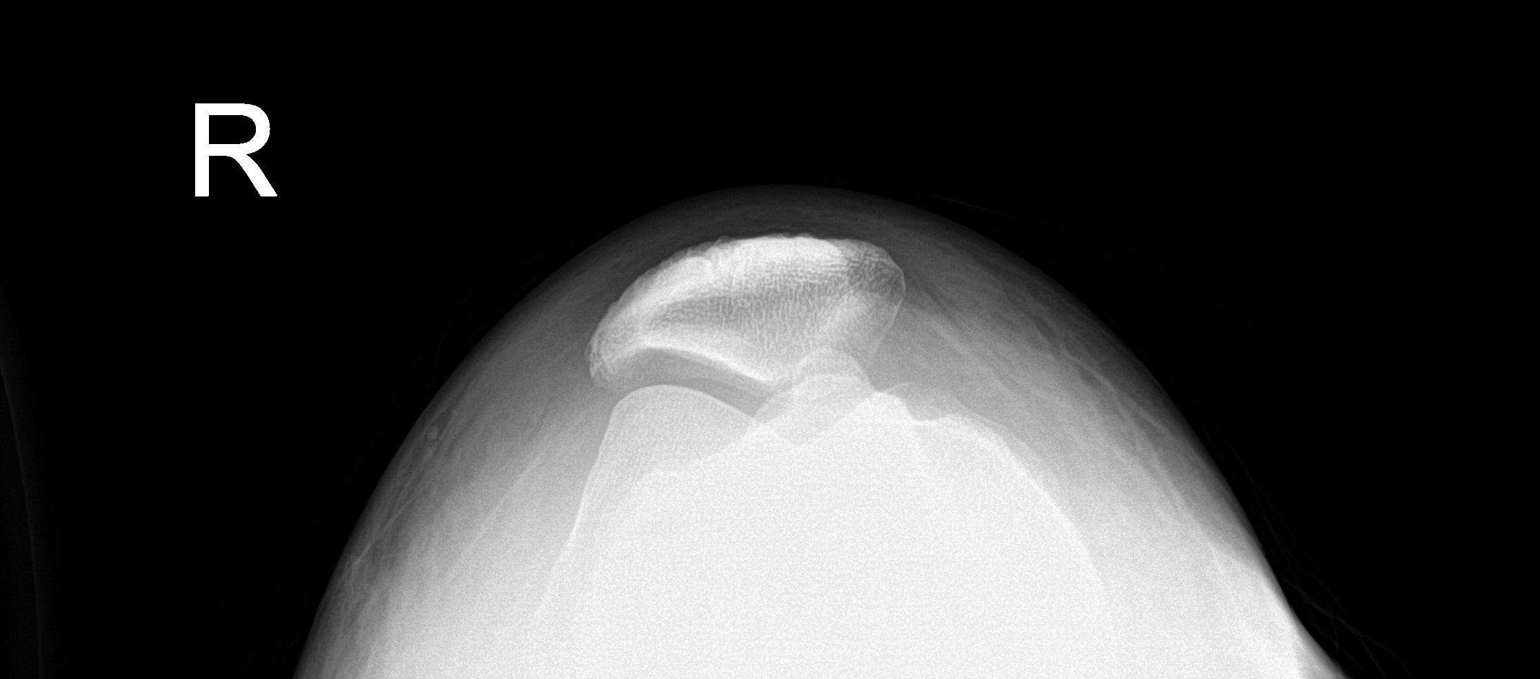

[3 of 3 positions shown; findings below may reference images not displayed]

FINDINGS: No displaced fracture or dislocation is seen. Minimal bony spurs
seen in the patella. There is no significant effusion. There is
subcutaneous stranding, possibly edema.
IMPRESSION: No fracture or dislocation is seen in the right knee. Small bony
spurs seen in the patella.

## 2021-09-25 IMAGING — US US EXTREM LOW VENOUS*R*
1 series · 14 of 24 positions shown · non-contrast
Comparison: None

CLINICAL DATA: RIGHT lower quadrant and RIGHT groin pain since
[DATE]

EXAM:
ULTRASOUND OF RIGHT GROIN SOFT TISSUES
TECHNIQUE: Ultrasound examination of the groin soft tissues was performed in
the area of clinical concern.

[Series 1: us venous img lower uni right (dvt) · portal-venous · 14 of 34 slices shown]
[im 1/34]
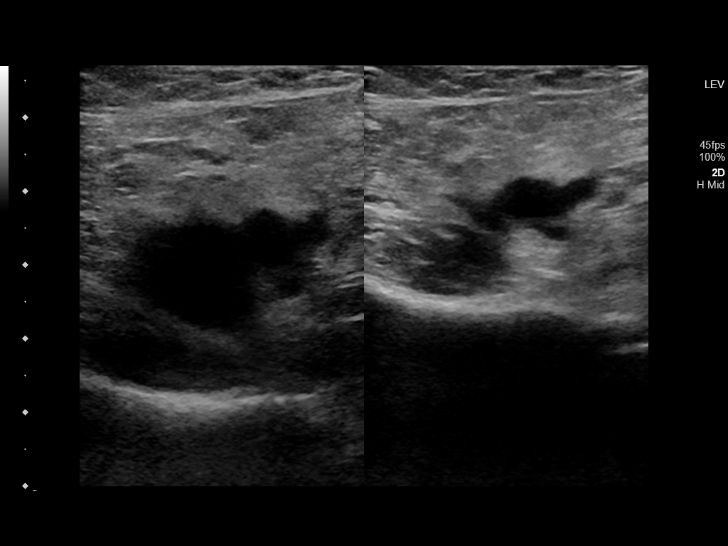
[im 3/34]
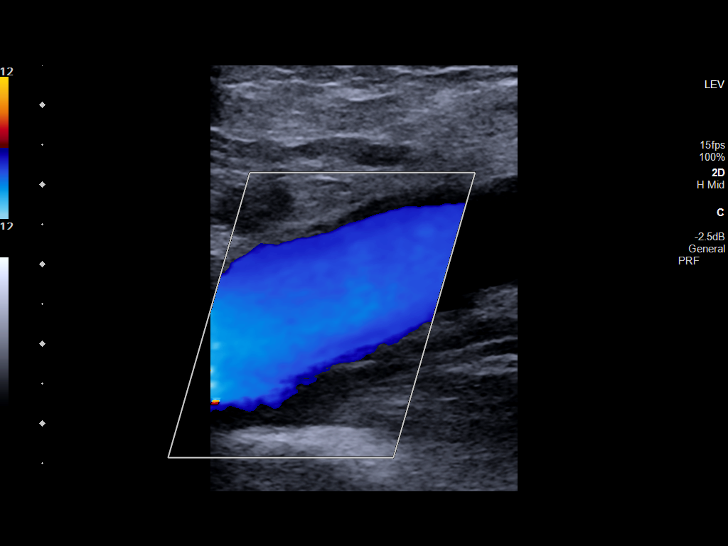
[im 6/34]
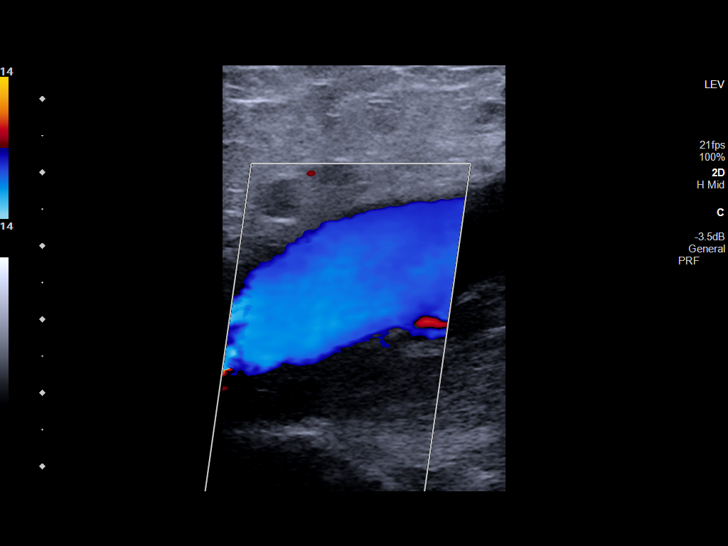
[im 9/34]
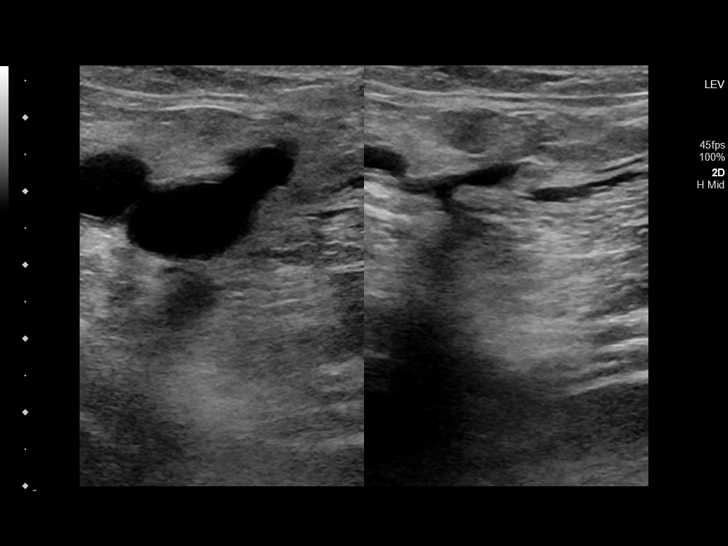
[im 11/34]
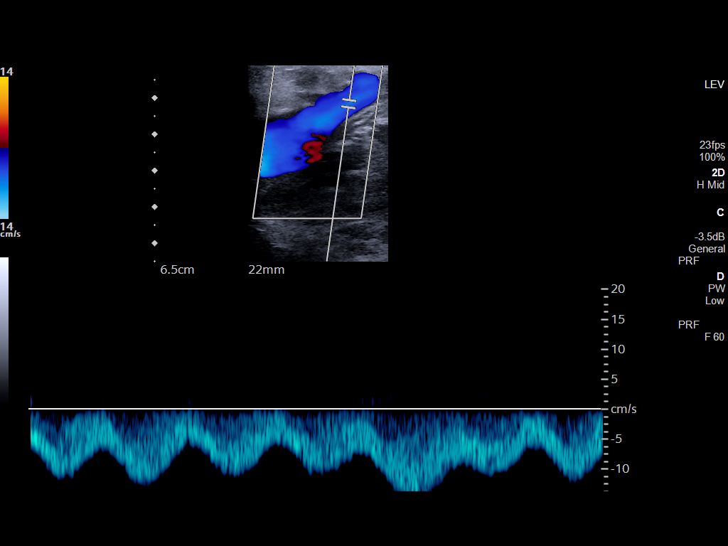
[im 13/34]
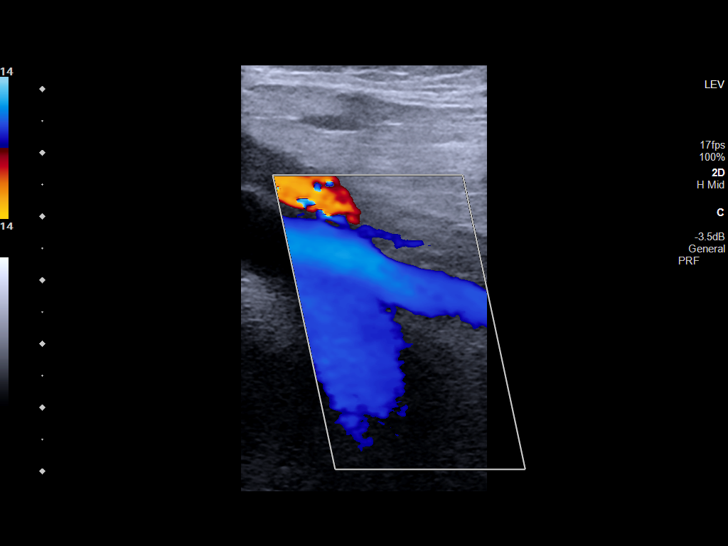
[im 16/34]
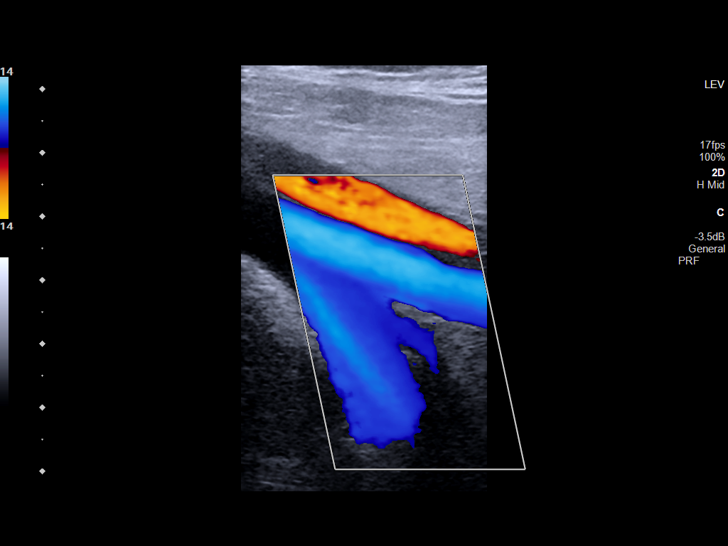
[im 18/34]
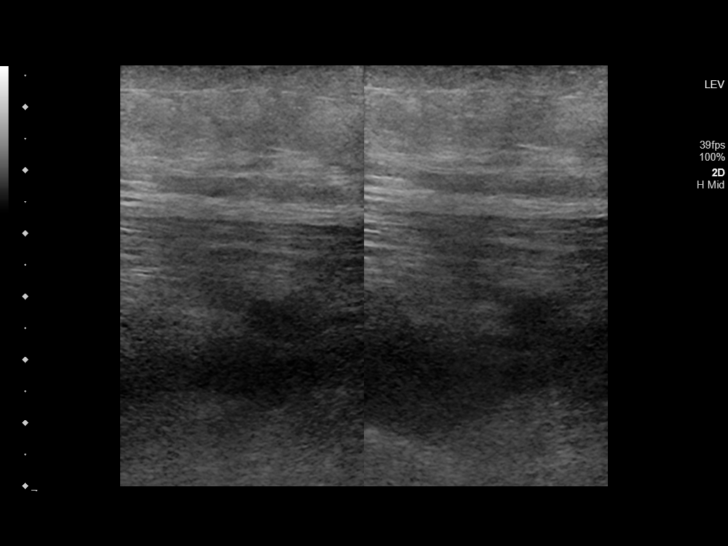
[im 21/34]
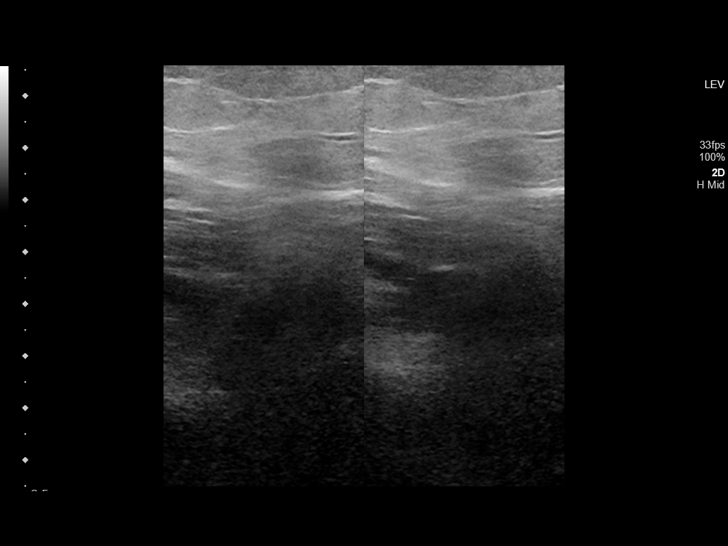
[im 23/34]
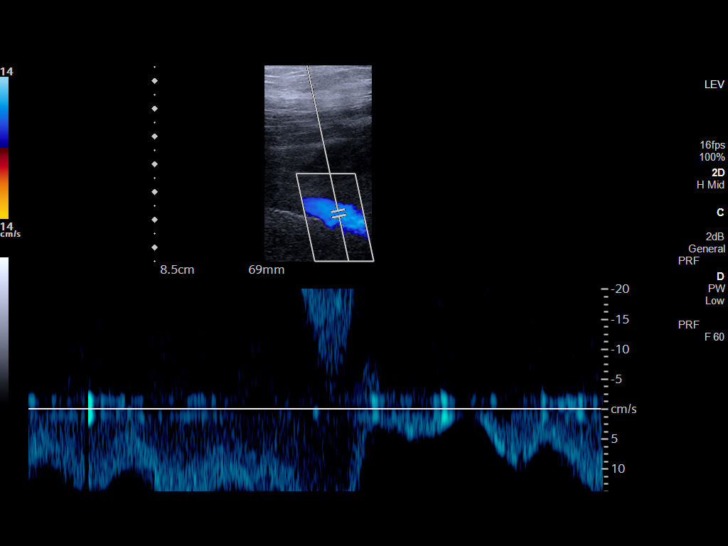
[im 26/34]
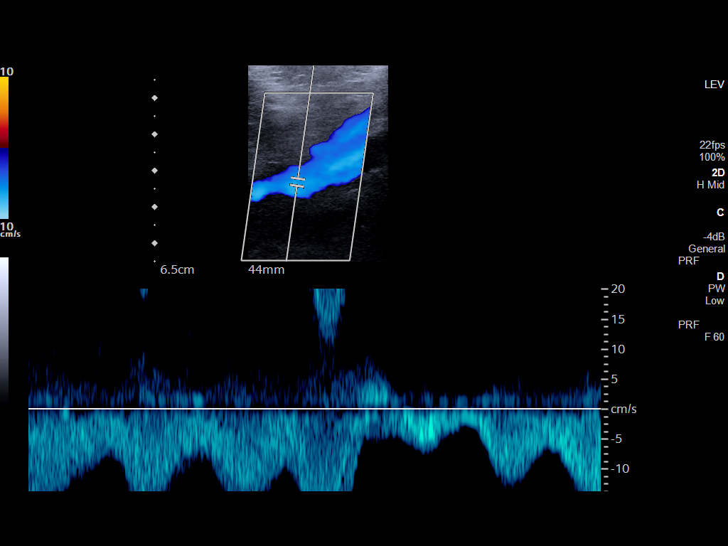
[im 28/34]
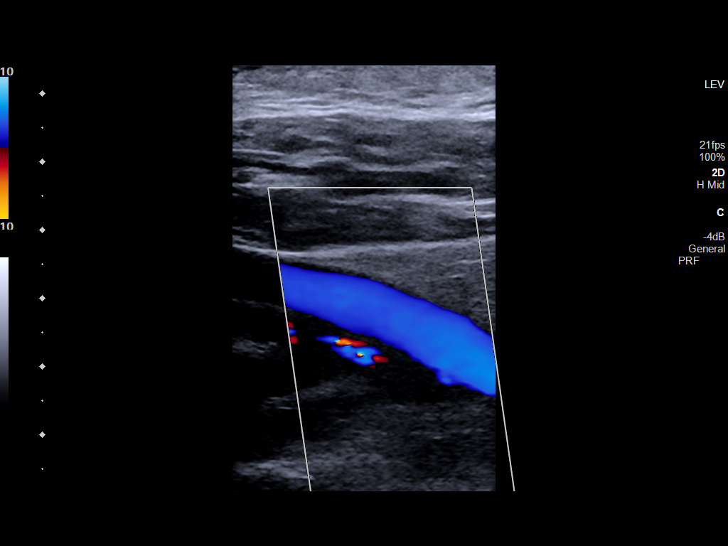
[im 31/34]
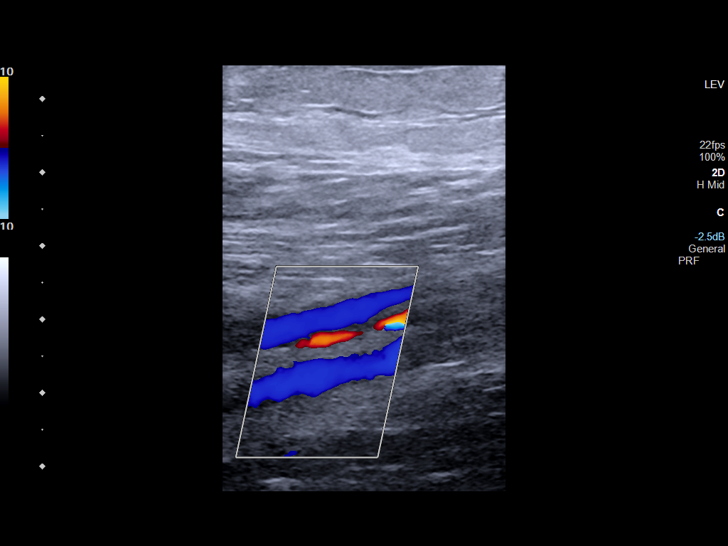
[im 34/34]
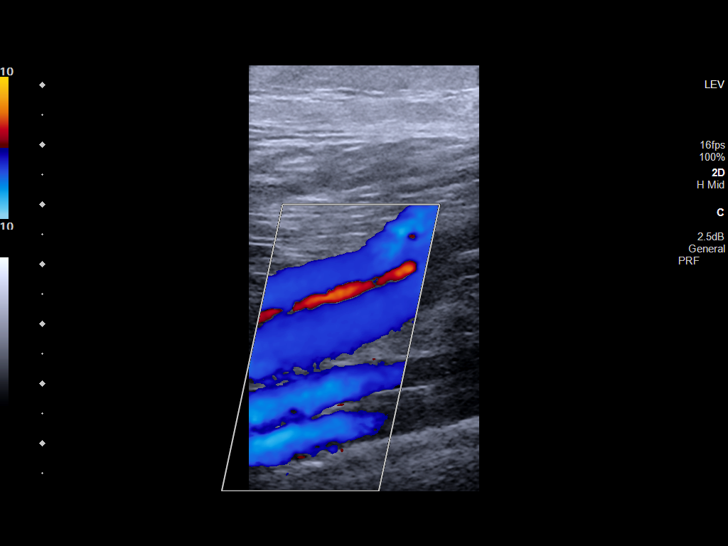

[14 of 24 positions shown; findings below may reference images not displayed]

FINDINGS: [HOSPITAL] the site of pain/clinical concern at the RIGHT groin was
performed.

No mass or adenopathy identified.

No hernia seen.

Subcutaneous soft tissues and muscular/fascial planes unremarkable.
IMPRESSION: Negative ultrasound of the RIGHT groin.

## 2021-09-25 MED ORDER — HYDROMORPHONE HCL 1 MG/ML IJ SOLN
1.0000 mg | INTRAMUSCULAR | Status: DC | PRN
  Administered 2021-09-25: 1 mg via INTRAVENOUS
  Filled 2021-09-25: qty 1

## 2021-09-25 MED ORDER — SODIUM CHLORIDE 0.9 % IV SOLN
INTRAVENOUS | Status: DC
Start: 2021-09-26 — End: 2021-09-26

## 2021-09-25 MED ORDER — ENOXAPARIN SODIUM 40 MG/0.4ML IJ SOSY
40.0000 mg | PREFILLED_SYRINGE | INTRAMUSCULAR | Status: DC
  Administered 2021-09-25: 40 mg via SUBCUTANEOUS
  Filled 2021-09-25 (×2): qty 0.4

## 2021-09-25 MED ORDER — HYDROMORPHONE HCL 1 MG/ML IJ SOLN
0.5000 mg | INTRAMUSCULAR | Status: DC | PRN
  Administered 2021-09-26 (×4): 0.5 mg via INTRAVENOUS
  Filled 2021-09-25 (×4): qty 1

## 2021-09-25 NOTE — Consult Note (Signed)
Date of Admission:  09/24/2021          Reason for Consult: MRSA bacteremia and likely polyarticular septic arthritis with pyomyositis   Referring Provider: [facility] consult and Arnetha Courser, MD   Assessment:  MRSA bacteremia with "metastatic spread of infection with" Likely septic right ankle Rule out bilateral septic wrists Bilateral pyomyositis of thighs Rule out septic knee Likely paraspinal muscle abscess Rule out endocarditis Obesity Hx of depression  Plan:  Continue vancomycin Repeat blood cultures Do not place central line or PICC line until we have proven sterility of blood Agree with TEE though evaluating her likely multiple muscle and joint sites of infection is critical Greatly appreciate Dr. Allena Katz seeing the patient I have ordered MRI of her right ankle and bilateral wrists. She will need an MRI of her thighs bilaterally with and without contrast and + right knee She is also going to need an MRI of her thoracic spine given the original pain and tenderness in that area I worry about pus muscles Continue to monitor for metastatic sites of infection Repeat CPK in the morning Check viral hepatitis panel for thoroughness   Principal Problem:   MRSA bacteremia Active Problems:   Polyarthritis   Severe sepsis (HCC)   Hypercalcemia   Depression   Engages in vaping   Scheduled Meds:  enoxaparin (LOVENOX) injection  40 mg Subcutaneous Q24H   nicotine  21 mg Transdermal Daily   sertraline  25 mg Oral q morning   Continuous Infusions:  famotidine (PEPCID) IV 100 mL/hr at 09/25/21 1553   vancomycin Stopped (09/25/21 1010)   PRN Meds:.acetaminophen, cyclobenzaprine, famotidine (PEPCID) IV, HYDROmorphone (DILAUDID) injection, ondansetron (ZOFRAN) IV, oxyCODONE-acetaminophen  HPI: Jill Cox is a 46 y.o. female with history of obesity prior depression and history of cholecystectomy but otherwise quite physically healthy in fact someone who typically with  her sister runs marathons who presented to the emergency department on March 5 with severe pain in her back and shoulder which made it nearly impossible for her to walk due to the severity.  She was given a trigger point injection of 5 mL of bupivacaine on that date and prescribed prednisone.   Within a few days she developed bilateral wrist knee thigh and ankle pain.  She was seen in the ER and blood cultures were taken and she was started on cephalexin.  Blood cultures have come back positive for methicillin resistant Staphylococcus aureus.  The patient does not recall any history of open wounds or infected hair follicles or cellulitis.  She has no history of injection drug use and her tox screen on admission did not show any drugs of abuse.  She was not on immunosuppressive drugs until she started the prednisone last Thursday.  On exam she has quite edematous and tender thighs bilaterally and I am certain she has pyomyositis her wrists are also tender bilaterally with erythema on the dorsal aspect of the right wrist.  Right lower extremity is also quite swollen and tender with decreased range of motion.  Her paraspinous muscles seem slightly tender on the right.  She does not have any focal neurological finding.  Original source of her MRSA bacteremia is unclear.  2D echocardiogram does not show evidence of endocarditis  Regardless of the source she has clinical evidence of deep infection and in fact "metastatic infection "with high likelihood of bilateral pyomyositis of her thighs possible right septic knee likely right septic ankle, bilateral septic wrists and concern for  potential paraspinous infection given the original presentation with pain in her paraspinous area where she had the bupivacaine injection.  I spent 124 minutes with the patient including than 50% of the time in face to face counseling of the patient and her sister personally reviewing chest x-ray 2D echocardiogram plain  films, updated culture data CBC BMP along with review of medical records in preparation for the visit and during the visit and in coordination of her care with both Dr. Nelson Chimes and Dr. Allena Katz    Review of Systems: Review of Systems  Constitutional:  Positive for diaphoresis and malaise/fatigue. Negative for chills, fever and weight loss.  HENT:  Negative for congestion, hearing loss, sore throat and tinnitus.   Eyes:  Negative for blurred vision and double vision.  Respiratory:  Negative for cough, sputum production, shortness of breath and wheezing.   Cardiovascular:  Positive for leg swelling. Negative for chest pain and palpitations.  Gastrointestinal:  Negative for abdominal pain, blood in stool, constipation, diarrhea, heartburn, melena, nausea and vomiting.  Genitourinary:  Negative for dysuria, flank pain and hematuria.  Musculoskeletal:  Positive for back pain, joint pain and myalgias. Negative for falls.  Skin:  Negative for itching and rash.  Neurological:  Positive for weakness. Negative for dizziness, sensory change, focal weakness, loss of consciousness and headaches.  Endo/Heme/Allergies:  Does not bruise/bleed easily.  Psychiatric/Behavioral:  Negative for depression, memory loss and suicidal ideas. The patient is not nervous/anxious.    Past Medical History:  Diagnosis Date   Depression    Engages in vaping     Social History   Tobacco Use   Smoking status: Never   Smokeless tobacco: Never   Tobacco comments:    Pt is doing vaping  Substance Use Topics   Alcohol use: Not Currently   Drug use: Never    Family History  Problem Relation Age of Onset   Heart disease Mother    Heart disease Father    No Known Allergies  OBJECTIVE: Blood pressure 129/71, pulse (!) 105, temperature 99.4 F (37.4 C), temperature source Oral, resp. rate 18, height 5\' 6"  (1.676 m), weight 81.6 kg, SpO2 96 %.  Physical Exam Constitutional:      General: She is not in acute  distress.    Appearance: Normal appearance. She is well-developed. She is obese. She is not ill-appearing or diaphoretic.  HENT:     Head: Normocephalic and atraumatic.     Right Ear: Hearing and external ear normal.     Left Ear: Hearing and external ear normal.     Nose: No nasal deformity or rhinorrhea.  Eyes:     General: No scleral icterus.       Right eye: Discharge present.        Left eye: No discharge.     Conjunctiva/sclera: Conjunctivae normal.     Right eye: Right conjunctiva is not injected.     Left eye: Left conjunctiva is not injected.     Pupils: Pupils are equal, round, and reactive to light.  Neck:     Vascular: No JVD.  Cardiovascular:     Rate and Rhythm: Normal rate and regular rhythm.     Heart sounds: Normal heart sounds, S1 normal and S2 normal. No murmur heard.   No friction rub. No gallop.  Pulmonary:     Effort: Pulmonary effort is normal. No respiratory distress.     Breath sounds: Normal breath sounds. No stridor. No wheezing or rhonchi.  Abdominal:  General: Bowel sounds are normal. There is no distension.     Palpations: Abdomen is soft.     Tenderness: There is no abdominal tenderness.  Musculoskeletal:     Right shoulder: Normal.     Left shoulder: Normal.     Right wrist: Swelling and tenderness present. Decreased range of motion.     Left wrist: Swelling and tenderness present. Decreased range of motion.     Cervical back: Normal range of motion and neck supple.     Right hip: Normal.     Left hip: Normal.     Right upper leg: Swelling, edema and tenderness present.     Left upper leg: Swelling, edema and tenderness present.     Right knee: Swelling present.     Left knee: Normal.     Right ankle: Swelling present. Tenderness present. Decreased range of motion.  Lymphadenopathy:     Head:     Right side of head: No submandibular, preauricular or posterior auricular adenopathy.     Left side of head: No submandibular, preauricular or  posterior auricular adenopathy.     Cervical: No cervical adenopathy.     Right cervical: No superficial or deep cervical adenopathy.    Left cervical: No superficial or deep cervical adenopathy.  Skin:    General: Skin is warm and dry.     Coloration: Skin is not pale.     Findings: No abrasion, bruising, ecchymosis, erythema, lesion or rash.     Nails: There is no clubbing.  Neurological:     Mental Status: She is alert and oriented to person, place, and time.     Sensory: No sensory deficit.     Coordination: Coordination normal.     Gait: Gait normal.  Psychiatric:        Attention and Perception: Attention and perception normal. She is attentive.        Mood and Affect: Mood is anxious.        Speech: Speech normal.        Behavior: Behavior normal. Behavior is cooperative.        Thought Content: Thought content normal.        Judgment: Judgment normal.    Lab Results Lab Results  Component Value Date   WBC 11.3 (H) 09/25/2021   HGB 11.3 (L) 09/25/2021   HCT 34.6 (L) 09/25/2021   MCV 92.3 09/25/2021   PLT 151 09/25/2021    Lab Results  Component Value Date   CREATININE 0.87 09/25/2021   BUN 23 (H) 09/25/2021   NA 140 09/25/2021   K 3.9 09/25/2021   CL 109 09/25/2021   CO2 22 09/25/2021    Lab Results  Component Value Date   ALT 50 (H) 09/24/2021   AST 51 (H) 09/24/2021   ALKPHOS 119 09/24/2021   BILITOT 1.1 09/24/2021     Microbiology: Recent Results (from the past 240 hour(s))  Blood culture (single)     Status: Abnormal (Preliminary result)   Collection Time: 09/23/21  1:53 PM   Specimen: BLOOD  Result Value Ref Range Status   Specimen Description   Final    BLOOD RIGHT ANTECUBITAL Performed at [facility], [address] [place], [place] [address]    Special Requests   Final    BOTTLES DRAWN AEROBIC AND ANAEROBIC Blood Culture adequate volume Performed at [facility], [address], [place], [place] [address]    Culture   Setup Time   Final  IN BOTH AEROBIC AND ANAEROBIC BOTTLES GRAM POSITIVE COCCI CRITICAL RESULT CALLED TO, READ BACK BY AND VERIFIED WITH: Clovia Cuff [facility] AT 0865 09/24/2021 GAA    Culture (A)  Final    STAPHYLOCOCCUS AUREUS SUSCEPTIBILITIES TO FOLLOW Performed at [facility], [address] [place], [place] [address]    Report Status PENDING  Incomplete  Blood Culture ID Panel (Reflexed)     Status: Abnormal   Collection Time: 09/23/21  1:53 PM  Result Value Ref Range Status   Enterococcus faecalis NOT DETECTED NOT DETECTED Final   Enterococcus Faecium NOT DETECTED NOT DETECTED Final   Listeria monocytogenes NOT DETECTED NOT DETECTED Final   Staphylococcus species DETECTED (A) NOT DETECTED Final    Comment: CRITICAL RESULT CALLED TO, READ BACK BY AND VERIFIED WITH: LISA KLUTTZ PHARMD AT 0522 09/24/2021 GAA    Staphylococcus aureus (BCID) DETECTED (A) NOT DETECTED Final    Comment: Methicillin (oxacillin)-resistant Staphylococcus aureus (MRSA). MRSA is predictably resistant to beta-lactam antibiotics (except ceftaroline). Preferred therapy is vancomycin unless clinically contraindicated. Patient requires contact precautions if  hospitalized. CRITICAL RESULT CALLED TO, READ BACK BY AND VERIFIED WITH: LISA KLUTTZ PHARMD AT 0522 09/24/2021 GAA    Staphylococcus epidermidis NOT DETECTED NOT DETECTED Final   Staphylococcus lugdunensis NOT DETECTED NOT DETECTED Final   Streptococcus species NOT DETECTED NOT DETECTED Final   Streptococcus agalactiae NOT DETECTED NOT DETECTED Final   Streptococcus pneumoniae NOT DETECTED NOT DETECTED Final   Streptococcus pyogenes NOT DETECTED NOT DETECTED Final   A.calcoaceticus-baumannii NOT DETECTED NOT DETECTED Final   Bacteroides fragilis NOT DETECTED NOT DETECTED Final   Enterobacterales NOT DETECTED NOT DETECTED Final   Enterobacter cloacae complex NOT DETECTED NOT DETECTED Final   Escherichia coli NOT DETECTED NOT DETECTED Final    Klebsiella aerogenes NOT DETECTED NOT DETECTED Final   Klebsiella oxytoca NOT DETECTED NOT DETECTED Final   Klebsiella pneumoniae NOT DETECTED NOT DETECTED Final   Proteus species NOT DETECTED NOT DETECTED Final   Salmonella species NOT DETECTED NOT DETECTED Final   Serratia marcescens NOT DETECTED NOT DETECTED Final   Haemophilus influenzae NOT DETECTED NOT DETECTED Final   Neisseria meningitidis NOT DETECTED NOT DETECTED Final   Pseudomonas aeruginosa NOT DETECTED NOT DETECTED Final   Stenotrophomonas maltophilia NOT DETECTED NOT DETECTED Final   Candida albicans NOT DETECTED NOT DETECTED Final   Candida auris NOT DETECTED NOT DETECTED Final   Candida glabrata NOT DETECTED NOT DETECTED Final   Candida krusei NOT DETECTED NOT DETECTED Final   Candida parapsilosis NOT DETECTED NOT DETECTED Final   Candida tropicalis NOT DETECTED NOT DETECTED Final   Cryptococcus neoformans/gattii NOT DETECTED NOT DETECTED Final   Meth resistant mecA/C and MREJ DETECTED (A) NOT DETECTED Final    Comment: CRITICAL RESULT CALLED TO, READ BACK BY AND VERIFIED WITH: Clovia Cuff PHARMD AT 0522 09/24/2021 GAA Performed at [facility], [address] [place], [place] [address]   Resp Panel by RT-PCR (Flu A&B, Covid) Nasopharyngeal Swab     Status: None   Collection Time: 09/24/21 10:36 AM   Specimen: Nasopharyngeal Swab; Nasopharyngeal(NP) swabs in vial transport medium  Result Value Ref Range Status   SARS Coronavirus 2 by RT PCR NEGATIVE NEGATIVE Final    Comment: (NOTE) SARS-CoV-2 target nucleic acids are NOT DETECTED.  The SARS-CoV-2 RNA is generally detectable in upper respiratory specimens during the acute phase of infection. The lowest concentration of SARS-CoV-2 viral copies this assay can detect is 138 copies/mL. A negative  result does not preclude SARS-Cov-2 infection and should not be used as the sole basis for treatment or other patient management decisions. A negative result may  occur with  improper specimen collection/handling, submission of specimen other than nasopharyngeal swab, presence of viral mutation(s) within the areas targeted by this assay, and inadequate number of viral copies(<138 copies/mL). A negative result must be combined with clinical observations, patient history, and epidemiological information. The expected result is Negative.  Fact Sheet for Patients:  BloggerCourse.com  Fact Sheet for Healthcare Providers:  SeriousBroker.it  This test is no t yet approved or cleared by the [place] FDA and  has been authorized for detection and/or diagnosis of SARS-CoV-2 by FDA under an Emergency Use Authorization (EUA). This EUA will remain  in effect (meaning this test can be used) for the duration of the COVID-19 declaration under Section 564(b)(1) of the Act, 21 U.S.C.section 360bbb-3(b)(1), unless the authorization is terminated  or revoked sooner.       Influenza A by PCR NEGATIVE NEGATIVE Final   Influenza B by PCR NEGATIVE NEGATIVE Final    Comment: (NOTE) The Xpert Xpress SARS-CoV-2/FLU/RSV plus assay is intended as an aid in the diagnosis of influenza from Nasopharyngeal swab specimens and should not be used as a sole basis for treatment. Nasal washings and aspirates are unacceptable for Xpert Xpress SARS-CoV-2/FLU/RSV testing.  Fact Sheet for Patients: BloggerCourse.com  Fact Sheet for Healthcare Providers: SeriousBroker.it  This test is not yet approved or cleared by the [place] FDA and has been authorized for detection and/or diagnosis of SARS-CoV-2 by FDA under an Emergency Use Authorization (EUA). This EUA will remain in effect (meaning this test can be used) for the duration of the COVID-19 declaration under Section 564(b)(1) of the Act, 21 U.S.C. section 360bbb-3(b)(1), unless the authorization is terminated  or revoked.  Performed at [facility], [address] [place], [place] [address]   Blood Culture (routine x 2)     Status: None (Preliminary result)   Collection Time: 09/24/21 10:36 AM   Specimen: BLOOD  Result Value Ref Range Status   Specimen Description   Final    BLOOD BLOOD RIGHT FOREARM Performed at [facility], [address], [place], [place] [address]    Special Requests   Final    BOTTLES DRAWN AEROBIC AND ANAEROBIC Blood Culture results may not be optimal due to an excessive volume of blood received in culture bottles Performed at [facility], [address] [place], [place] [address]    Culture  Setup Time   Final    GRAM POSITIVE COCCI IN BOTH AEROBIC AND ANAEROBIC BOTTLES CRITICAL RESULT CALLED TO, READ BACK BY AND VERIFIED WITH: RODNEY GRUBB PHARMD AT 2330 09/24/2021 GA GRAM STAIN REVIEWED-AGREE WITH RESULT Performed at [facility], [address], [place], [place] [address]    Culture GRAM POSITIVE COCCI  Final   Report Status PENDING  Incomplete  Blood Culture (routine x 2)     Status: None (Preliminary result)   Collection Time: 09/24/21 10:37 AM   Specimen: BLOOD  Result Value Ref Range Status   Specimen Description   Final    BLOOD BLOOD LEFT FOREARM Performed at [facility], [address] [place], [place] [address]    Special Requests   Final    BOTTLES DRAWN AEROBIC AND ANAEROBIC Blood Culture results may not be optimal due to an excessive volume of blood received in culture bottles Performed at [facility], [address] [place], [place] [address]    Culture  Setup Time   Final    GRAM POSITIVE COCCI IN BOTH AEROBIC AND ANAEROBIC BOTTLES CRITICAL RESULT CALLED TO, READ BACK BY AND VERIFIED WITH: RODNEY GRUBB PHARMD AT 2330 09/24/21 GA GRAM STAIN REVIEWED-AGREE WITH RESULT Performed at [facility], [address], [place], [place] [address]    Culture [facility] POSITIVE COCCI   Final   Report Status PENDING  Incomplete    Acey Lav, MD [facility] for Infectious Disease [facility] [phone] pager  09/25/2021, 4:36 PM

## 2021-09-25 NOTE — Progress Notes (Signed)
? ? ?  [facility] has been requested to perform a transesophageal echocardiogram on Jill Cox for MRSA bacteremia secondary to likely septic polyarthritis.  After careful review of history and examination, the risks and benefits of transesophageal echocardiogram have been explained including risks of esophageal damage, perforation (1:10,000 risk), bleeding, pharyngeal hematoma as well as other potential complications associated with anesthesia including aspiration, arrhythmia, respiratory failure and death. Alternatives to treatment were discussed, questions were answered. Patient and family are willing to proceed.  ? ? ?2D echo 09/24/2021: ?1. Left ventricular ejection fraction, by estimation, is 60 to 65%. The  ?left ventricle has normal function. The left ventricle has no regional  ?wall motion abnormalities. Left ventricular diastolic parameters were  ?normal.  ? 2. Right ventricular systolic function is normal. The right ventricular  ?size is normal.  ? 3. The mitral valve is normal in structure. Trivial mitral valve  ?regurgitation.  ? 4. The aortic valve is normal in structure. Aortic valve regurgitation is  ?not visualized. No aortic stenosis is present.  ? 5. The inferior vena cava is normal in size with greater than 50%  ?respiratory variability, suggesting right atrial pressure of 3 mmHg. ? ? ?Christell Faith, PA-C ?09/25/2021 12:42 PM  ? ?

## 2021-09-25 NOTE — Assessment & Plan Note (Signed)
Continue Zoloft 

## 2021-09-25 NOTE — Hospital Course (Addendum)
Taken from H&P.  Jill Cox is a 46 y.o. female with medical history significant of depression, vaping, who presents with multiple joint pain and a positive blood culture for MRSA.   Pt states that she had muscle pain below her right shoulder blade. She was seen in ED on 3/5 and had local trigger point injection of 5 cc of 0.25% bupivicaine. She states that her muscle pain has resolved.  She developed multiple joint pain 4 days ago, including bilateral wrist, bilateral knee, right ankle.  She also has pain in bilateral upper thigh.  The pain is constant, moderate, aching, nonradiating.  Patient does not have fever or chills.  Patient was started on prednisone 3 days ago by PCP, without significant improvement. She was seen in ED again on yesterday 3/11, and started on Keflex, still without significant improvement. Today patient was called back due to positive blood culture for MRSA.   Patient does not have chest pain, cough, shortness breath.  Patient has nausea, no vomiting, diarrhea or abdominal pain.  No symptoms of UTI.  Patient denies any obvious injuries to skin, no history of any hardware.  Patient met sepsis criteria with leukocytosis, tachycardia, mild lactic acidosis which has been resolved now.  Procalcitonin at 0.85 and repeat blood cultures are also positive for MRSA. Markedly elevated CRP at 26.8 and ESR at 63.  Significant right lower extremity edema and erythema, right lower extremity venous Doppler was negative. Lyme serologies was also sent from ED during prior recent visit-results pending. UDS positive for tricyclic's.  Patient denies any drug use, vape regularly. He was placed on vancomycin. ID was consulted-will appreciate their help. TTE was negative for any vegetations-TEE ordered and will be done tomorrow.  ID saw her and there is a concern of multiple septic joints which might need aspiration and washout.  They ordered multiple MRIs to check. Orthopedic surgery was also  consulted.

## 2021-09-25 NOTE — Consult Note (Signed)
ORTHOPAEDIC CONSULTATION  REQUESTING PHYSICIAN: Lorella Nimrod, MD  Chief Complaint:   Multiple joint pain  History of Present Illness: Jill Cox is a 46 y.o. female who was seen 2 days ago in the emergency department due to an approximately 1 week history of thigh pain and swelling as well as wrist pain and swelling.  She states that approximately 1 week ago, she received a trigger point injection to the right parascapular region.  This was performed in the emergency department after she had severe pain in that region as she was getting out of her car from a long drive.  She is evaluated by her primary care physician on 09/21/2021 for a 1 day history of bilateral thigh pain, right wrist erythema, and for follow-up of right parascapular pain.  She was referred to physical therapy and given a prednisone taper.  She then represented to the emergency department on 09/23/2021 due to increased pain with ambulation from bilateral thigh pain.  She also noticed pain about the left wrist and right ankle shortly afterwards.  Blood cultures were obtained at that time and her steroid dosing was increased.  She was discharged from the emergency department, but called back after blood cultures returned MRSA.  She has been started on IV vancomycin.  She is currently undergoing work-up with appropriate lab work and other imaging studies for source of bacteremia.  Infectious diseases team has also been consulted and multiple joint MRIs have been ordered to rule out underlying infectious pathology.  Of note, the patient has no significant medical history including that of rheumatologic or autoimmune conditions.  She denies any history of animal/tick bites.  She denies any history of previous joint pain or swelling.  She is quite active at baseline and has run a marathon in the past.  She continues to have difficulty with ambulation due to this pain.  Past  Medical History:  Diagnosis Date   Depression    Engages in vaping    Past Surgical History:  Procedure Laterality Date   CHOLECYSTECTOMY     Social History   Socioeconomic History   Marital status: Married    Spouse name: Not on file   Number of children: Not on file   Years of education: Not on file   Highest education level: Not on file  Occupational History   Not on file  Tobacco Use   Smoking status: Never   Smokeless tobacco: Never   Tobacco comments:    Pt is doing vaping  Substance and Sexual Activity   Alcohol use: Not Currently   Drug use: Never   Sexual activity: Not on file  Other Topics Concern   Not on file  Social History Narrative   Not on file   Social Determinants of Health   Financial Resource Strain: Not on file  Food Insecurity: Not on file  Transportation Needs: Not on file  Physical Activity: Not on file  Stress: Not on file  Social Connections: Not on file   Family History  Problem Relation Age of Onset   Heart disease Mother    Heart disease Father    No Known Allergies Prior to Admission medications   Medication Sig Start Date End Date Taking? Authorizing Provider  cephALEXin (KEFLEX) 500 MG capsule Take 1 capsule (500 mg total) by mouth 4 (four) times daily for 5 days. 09/23/21 09/28/21  Rada Hay, MD  cyclobenzaprine (FLEXERIL) 10 MG tablet Take 1 tablet (10 mg total) by mouth 3 (three) times  daily as needed for muscle spasms. 09/17/21   Duffy Bruce, MD  naproxen (NAPROSYN) 500 MG tablet Take 1 tablet (500 mg total) by mouth 2 (two) times daily with a meal for 10 days. 09/23/21 10/03/21  Rada Hay, MD  oxyCODONE-acetaminophen (PERCOCET) 5-325 MG tablet Take 1 tablet by mouth every 4 (four) hours as needed for up to 5 days for severe pain. 09/23/21 09/28/21  Rada Hay, MD  predniSONE (DELTASONE) 20 MG tablet Take 3 tablets (60 mg total) by mouth daily with breakfast for 5 days. 09/23/21 09/28/21  Rada Hay,  MD  sertraline (ZOLOFT) 25 MG tablet Take 25 mg by mouth every morning. 09/18/21   [provider]   Recent Labs    09/23/21 1207 09/24/21 1036 09/25/21 0508  WBC 16.0* 17.3* 11.3*  HGB 14.1 12.5 11.3*  HCT 42.1 38.2 34.6*  PLT 130* 151 151  K 3.6 3.5 3.9  CL 104 103 109  CO2 22 26 22   BUN 32* 32* 23*  CREATININE 1.27* 0.99 0.87  GLUCOSE 108* 110* 117*  CALCIUM 10.5* 9.8 8.5*  INR  --  1.1  --     CRP-26 Sedimentation rate-63   US Venous Img Lower Unilateral Right (DVT)  Result Date: 09/25/2021 CLINICAL DATA:  RIGHT lower quadrant and RIGHT groin pain since November 2022 EXAM: ULTRASOUND OF RIGHT GROIN SOFT TISSUES TECHNIQUE: Ultrasound examination of the groin soft tissues was performed in the area of clinical concern. COMPARISON:  None FINDINGS: Imaging at the site of pain/clinical concern at the RIGHT groin was performed. No mass or adenopathy identified. No hernia seen. Subcutaneous soft tissues and muscular/fascial planes unremarkable. IMPRESSION: Negative ultrasound of the RIGHT groin. Electronically Signed   By: Lavonia Dana M.D.   On: 09/25/2021 11:38   DG Chest Port 1 View  Result Date: 09/24/2021 CLINICAL DATA:  Positive culture.  Possible sepsis. EXAM: PORTABLE CHEST 1 VIEW COMPARISON:  09/17/2021 FINDINGS: Midline trachea. Normal heart size. No pleural effusion or pneumothorax. Pulmonary interstitial prominence is favored to be within normal variation, given low lung volumes and AP portable technique. No lobar consolidation. IMPRESSION: Low lung volumes, without acute disease. Electronically Signed   By: Abigail Miyamoto M.D.   On: 09/24/2021 10:55   ECHOCARDIOGRAM COMPLETE  Result Date: 09/24/2021    ECHOCARDIOGRAM REPORT   Patient Name:   CEDELLA Cox Date of Exam: 09/24/2021 Medical Rec #:  [id number]   Height:       66.0 in Accession #:    [id number]  Weight:       180.0 lb Date of Birth:  [birth year]   BSA:          1.912 m Patient Age:    35 years    BP:            121/66 mmHg Patient Gender: F           HR:           91 bpm. Exam Location:  [facility] Procedure: 2D Echo and Intracardiac Opacification Agent Indications:     Bacteremia R78.81  History:         Patient has no prior history of Echocardiogram examinations.  Sonographer:     Kathlen Brunswick RDCS Referring Phys:  [id number] Soledad Gerlach NIU Diagnosing Phys: Donnelly Angelica  Sonographer Comments: Technically difficult study due to poor echo windows and suboptimal subcostal window. IMPRESSIONS  1. Left ventricular ejection fraction, by estimation, is 60 to 65%. The left ventricle has  normal function. The left ventricle has no regional wall motion abnormalities. Left ventricular diastolic parameters were normal.  2. Right ventricular systolic function is normal. The right ventricular size is normal.  3. The mitral valve is normal in structure. Trivial mitral valve regurgitation.  4. The aortic valve is normal in structure. Aortic valve regurgitation is not visualized. No aortic stenosis is present.  5. The inferior vena cava is normal in size with greater than 50% respiratory variability, suggesting right atrial pressure of 3 mmHg. Conclusion(s)/Recommendation(s): No evidence of valvular vegetations on this transthoracic echocardiogram. Consider a transesophageal echocardiogram to exclude infective endocarditis if clinically indicated. FINDINGS  Left Ventricle: Left ventricular ejection fraction, by estimation, is 60 to 65%. The left ventricle has normal function. The left ventricle has no regional wall motion abnormalities. Definity contrast agent was given IV to delineate the left ventricular  endocardial borders. The left ventricular internal cavity size was normal in size. There is no left ventricular hypertrophy. Left ventricular diastolic parameters were normal. Right Ventricle: The right ventricular size is normal. No increase in right ventricular wall thickness. Right ventricular systolic function is normal. Left Atrium: Left atrial  size was normal in size. Right Atrium: Right atrial size was normal in size. Pericardium: There is no evidence of pericardial effusion. Mitral Valve: The mitral valve is normal in structure. Trivial mitral valve regurgitation. Tricuspid Valve: The tricuspid valve is not well visualized. Tricuspid valve regurgitation is not demonstrated. No evidence of tricuspid stenosis. Aortic Valve: The aortic valve is normal in structure. Aortic valve regurgitation is not visualized. No aortic stenosis is present. Aortic valve peak gradient measures 9.9 mmHg. There is no evidence of aortic valve vegetation. Pulmonic Valve: The pulmonic valve was grossly normal. Pulmonic valve regurgitation is not visualized. No evidence of pulmonic stenosis. Aorta: The aortic root is normal in size and structure. Venous: The inferior vena cava is normal in size with greater than 50% respiratory variability, suggesting right atrial pressure of 3 mmHg. IAS/Shunts: No atrial level shunt detected by color flow Doppler.  LEFT VENTRICLE PLAX 2D LVIDd:         4.80 cm Diastology LVIDs:         3.10 cm LV e' medial:    13.60 cm/s LV PW:         1.10 cm LV E/e' medial:  6.6 LV IVS:        1.00 cm LV e' lateral:   12.30 cm/s                        LV E/e' lateral: 7.2  RIGHT VENTRICLE RV Basal diam:  3.40 cm RV S prime:     21.90 cm/s TAPSE (M-mode): 3.4 cm LEFT ATRIUM             Index        RIGHT ATRIUM           Index LA diam:        3.10 cm 1.62 cm/m   RA Area:     15.60 cm LA Vol (A2C):   67.5 ml 35.30 ml/m  RA Volume:   40.40 ml  21.13 ml/m LA Vol (A4C):   44.0 ml 23.01 ml/m LA Biplane Vol: 54.7 ml 28.61 ml/m  AORTIC VALVE              PULMONIC VALVE AV Vmax:      157.00 cm/s PV Vmax:       1.22 m/s  AV Peak Grad: 9.9 mmHg    PV Peak grad:  6.0 mmHg LVOT Vmax:    119.00 cm/s LVOT Vmean:   90.000 cm/s LVOT VTI:     0.243 m  AORTA Ao Asc diam: 2.60 cm MITRAL VALVE               TRICUSPID VALVE MV Area (PHT): 4.60 cm    TV Peak grad:   19.5 mmHg  MV Decel Time: 165 msec    TV Vmax:        2.21 m/s MV E velocity: 89.10 cm/s MV A velocity: 68.60 cm/s  SHUNTS MV E/A ratio:  1.30        Systemic VTI: 0.24 m Donnelly Angelica Electronically signed by Donnelly Angelica Signature Date/Time: 09/24/2021/6:47:53 PM    Final    DG Knee 3 Views Right  Result Date: 09/25/2021 CLINICAL DATA:  Pain right knee EXAM: RIGHT KNEE - 3 VIEW COMPARISON:  None. FINDINGS: No displaced fracture or dislocation is seen. Minimal bony spurs seen in the patella. There is no significant effusion. There is subcutaneous stranding, possibly edema. IMPRESSION: No fracture or dislocation is seen in the right knee. Small bony spurs seen in the patella. Electronically Signed   By: Elmer Picker M.D.   On: 09/25/2021 15:47     Positive ROS: All other systems have been reviewed and were otherwise negative with the exception of those mentioned in the HPI and as above.  Physical Exam: BP 129/71 (BP Location: Right Arm)    Pulse (!) 105 Comment: pt nurse, Debi, notified   Temp 99.4 F (37.4 C) (Oral)    Resp 18    Ht 5\' 6"  (1.676 m)    Wt 81.6 kg    LMP  (LMP Unknown)    SpO2 96%    BMI 29.05 kg/m  General:  Alert, no acute distress Psychiatric:  Patient is competent for consent with normal mood and affect   Cardiovascular:  No pedal edema, regular rate and rhythm Respiratory:  No wheezing, non-labored breathing GI:  Abdomen is soft and non-tender Skin:  No lesions in the area of chief complaint, no erythema Neurologic:  Sensation intact distally, CN grossly intact Lymphatic:  No axillary or cervical lymphadenopathy  Bilat UE: +ain/pin/u motor SILT r/u/m/ax +rad pulse Able to perform passive RoM of bilateral wrists through arc of motion of ~50 deg ext to 30 deg flexion with minimal discomfort. Increased pain bilaterally beyond this.  Significant erythema about volar aspect of R wrist and dorsal aspect of L wrist, hand, and forearm. Significant soft tissue swelling of L wrist > R  wrist.  No significant Kanavel signs.  No grossly palpable superficial fluid collection.  Bilat LE: 5/5 DF/PF/EHL SILT s/s/t/sp/dp distr Foot wwp RoM R knee 0-85 passively with minimal discomfort; increased pain with attempted knee flexion beyond this. Able to perform dorsiflexion/plantarflexion of bilateral ankles. Significant erythema and swelling about thighs and R calf. Tenderness to palpation of R calf. Prominent tibial tubercle on L side, consistent with history of Osgood-Schlatter's.  No grossly palpable superficial fluid collection.  X-rays:  Imaging: pending MRIs of multiple joints.   Assessment/Plan: 46 yo F w multiple joint pain in the setting of MRSA bacteremia. 1.  Given her clinical examination currently, I do not suspect septic arthritis of bilateral wrists, knees, and ankles.  No current plan for joint aspiration.  2.  Agree with MRIs of the bilateral wrist, right ankle, and bilateral thighs/right knee to further evaluate  underlying infectious process.  3.  Continue IV antibiotics per ID team.  4.  I will plan to follow-up after MRIs are obtained.  I have made the patient n.p.o. after midnight in case MRIs are able to be obtained today and there is benefit to surgical intervention.  If MRIs do not show any focal pathology, I would recommend Rheumatology evaluation.    Leim Fabry   09/25/2021 3:57 PM

## 2021-09-25 NOTE — Progress Notes (Addendum)
?Progress Note ? ? ?Patient: Jill Cox OAC:[id number] DOB: 04-07-1976 DOA: 09/24/2021     1 ?DOS: the patient was seen and examined on 09/25/2021 ?  ?Brief hospital course: ?Taken from H&P. ? ?Jill Cox is a 46 y.o. female with medical history significant of depression, vaping, who presents with multiple joint pain and a positive blood culture for MRSA. ?  ?Pt states that she had muscle pain below her right shoulder blade. She was seen in ED on 3/5 and had local trigger point injection of 5 cc of 0.25% bupivicaine. She states that her muscle pain has resolved.  She developed multiple joint pain 4 days ago, including bilateral wrist, bilateral knee, right ankle.  She also has pain in bilateral upper thigh.  The pain is constant, moderate, aching, nonradiating.  Patient does not have fever or chills.  Patient was started on prednisone 3 days ago by PCP, without significant improvement. She was seen in ED again on yesterday 3/11, and started on Keflex, still without significant improvement. Today patient was called back due to positive blood culture for MRSA. ?  ?Patient does not have chest pain, cough, shortness breath.  Patient has nausea, no vomiting, diarrhea or abdominal pain.  No symptoms of UTI. ? ?Patient denies any obvious injuries to skin, no history of any hardware. ? ?Patient met sepsis criteria with leukocytosis, tachycardia, mild lactic acidosis which has been resolved now.  Procalcitonin at 0.85 and repeat blood cultures are also positive for MRSA. ?Markedly elevated CRP at 26.8 and ESR at 63.  Significant right lower extremity edema and erythema, right lower extremity venous Doppler was negative. ?Lyme serologies was also sent from ED during prior recent visit-results pending. ?UDS positive for tricyclic's.  Patient denies any drug use, vape regularly. ?He was placed on vancomycin. ?ID was consulted-will appreciate their help. ?TTE was negative for any vegetations-TEE ordered and will be done  tomorrow. ? ?ID saw her and there is a concern of multiple septic joints which might need aspiration and washout.  They ordered multiple MRIs to check. ?Orthopedic surgery was also consulted. ? ? ?Assessment and Plan: ?* MRSA bacteremia ?Patient has quite unusual presentation of MRSA bacteremia.  No hardware or recent injuries.  Only foreign substance was a recent shoulder injection for pain. 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 ?Significant increase in inflammatory markers like CRP and ESR. ?TTE is negative for any vegetations. ?TEE scheduled for tomorrow. ?ID was consulted-will appreciate their help. ?-Continue with vancomycin ? ?Severe sepsis (Jill Cox) ?Patient met sepsis criteria with leukocytosis, tachycardia, elevated lactic acid which has been improved now.  Blood cultures positive for MRSA with multiple joint involvement. ?Patient did received IV fluid and antibiotics in ED. ?-Continue with vancomycin ? ?Hypercalcemia ?Resolved with IVF. ? 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 ?Her right lower extremity and left upper extremity was the most affected. ?Right lower extremity venous Doppler was negative. ?Lyme serologies was done from ED-pending results ?No history of tick bite or exposure except she was deep cleaning her house in [place] approximately 8 to 9 days ago.  Has not noticed any rash. ?-We will get right knee x-ray to see if there is any effusion which can be tapped. ? ?Depression ?-Continue Zoloft ? ?Engages in vaping ?- Nicotine patch ? ?Subjective: Patient was continued to have painful erythema and edema involving multiple joints, more on right lower extremity and left upper extremity.  There was also some erythema on ventral  surface of left wrist.  No fever or chills.  No recent injuries.  Denies any known tick bite.  Her symptoms started 2 days after doing a deep cleaning at her [place] with  some shoulder pain and received a trigger point injection on 09/17/2021 in ED. she denies any presence of hardware in her body.  No IV drug use. ? ?Physical Exam: ?Vitals:  ? 09/24/21 2029 09/25/21 0017 09/25/21 0532 09/25/21 0806  ?BP: (!) 128/59 (!) 108/51 125/76 116/66  ?Pulse: 96 (!) 104 100 96  ?Resp: _0 ?Temp: 98.2 ?F (36.8 ?C) 98.5 ?F (36.9 ?C) 98.4 ?F (36.9 ?C) 99.4 ?F (37.4 ?C)  ?TempSrc:    Oral  ?SpO2: 96% 90% 93% 94%  ?Weight:      ?Height:      ? ?General.  Well-developed lady, in no acute distress. ?Musculoskeletal.  Erythema and edema involving multiple joints, more prominent on right lower extremity and left upper extremity. ?Pulmonary.  Lungs clear bilaterally, normal respiratory effort. ?CV.  Regular rate and rhythm, no JVD, rub or murmur. ?Abdomen.  Soft, nontender, nondistended, BS positive. ?CNS.  Alert and oriented x3.  No focal neurologic deficit. ?Extremities.  No edema, no cyanosis, pulses intact and symmetrical. ?Psychiatry.  Judgment and insight appears normal. ? ?Data Reviewed: ?I reviewed prior notes, labs and images. ? ?Family Communication: Discussed with sister at bedside. ? ?Disposition: ?Status is: Inpatient ?Remains inpatient appropriate because: Severity of illness ? ? Planned Discharge Destination: Home ? ?DVT prophylaxis.  Lovenox ? ?Time spent: 55 minutes ? ?This record has been created using Systems analyst. Errors have been sought and corrected,but may not always be located. Such creation errors do not reflect on the standard of care. ? ?Author: ?Jill Nimrod, MD ?09/25/2021 1:58 PM ? ?For on call review [email or web address].  ?

## 2021-09-25 NOTE — Assessment & Plan Note (Signed)
-  Nicotine patch 

## 2021-09-25 NOTE — Assessment & Plan Note (Signed)
Patient met sepsis criteria with leukocytosis, tachycardia, elevated lactic acid which has been improved now.  Blood cultures positive for MRSA with multiple joint involvement. ?Patient did received IV fluid and antibiotics in ED. ?-Continue with vancomycin ?

## 2021-09-25 NOTE — Assessment & Plan Note (Signed)
Patient has quite unusual presentation of MRSA bacteremia.  No hardware or recent injuries.  Only foreign substance was a recent shoulder injection for pain. 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 ?Significant increase in inflammatory markers like CRP and ESR. ?TTE is negative for any vegetations. ?TEE scheduled for tomorrow. ?ID was consulted-will appreciate their help. ?-Continue with vancomycin ?

## 2021-09-25 NOTE — Assessment & Plan Note (Signed)
Patient has multiple joint involvement and symptoms like migratory arthritis. ?Her right lower extremity and left upper extremity was the most affected. ?Right lower extremity venous Doppler was negative. ?Lyme serologies was done from ED-pending results ?No history of tick bite or exposure except she was deep cleaning her house in [place] approximately 8 to 9 days ago.  Has not noticed any rash. ?-We will get right knee x-ray to see if there is any effusion which can be tapped. ?

## 2021-09-25 NOTE — Assessment & Plan Note (Signed)
Resolved with IVF 

## 2021-09-26 ENCOUNTER — Inpatient Hospital Stay: Payer: 59

## 2021-09-26 ENCOUNTER — Other Ambulatory Visit: Payer: Self-pay

## 2021-09-26 ENCOUNTER — Encounter (HOSPITAL_COMMUNITY): Payer: Self-pay | Admitting: Internal Medicine

## 2021-09-26 ENCOUNTER — Inpatient Hospital Stay (HOSPITAL_COMMUNITY)
Admission: AD | Admit: 2021-09-26 | Discharge: 2021-10-06 | DRG: 853 | Disposition: A | Discharge status: Home / Self Care | Payer: 59 | Source: Other Acute Inpatient Hospital | Attending: Internal Medicine | Admitting: Internal Medicine

## 2021-09-26 ENCOUNTER — Encounter
Admission: EM | Disposition: A | Discharge status: Other Acute Inpatient Hospital | Payer: Self-pay | Source: Home / Self Care | Attending: Internal Medicine

## 2021-09-26 ENCOUNTER — Inpatient Hospital Stay (HOSPITAL_COMMUNITY)
Admit: 2021-09-26 | Discharge: 2021-09-26 | Disposition: A | Discharge status: Home / Self Care | Payer: 59 | Attending: Physician Assistant | Admitting: Physician Assistant

## 2021-09-26 DIAGNOSIS — M25461 Effusion, right knee: Secondary | ICD-10-CM | POA: Diagnosis present

## 2021-09-26 DIAGNOSIS — G061 Intraspinal abscess and granuloma: Secondary | ICD-10-CM | POA: Diagnosis present

## 2021-09-26 DIAGNOSIS — E8770 Fluid overload, unspecified: Secondary | ICD-10-CM | POA: Diagnosis present

## 2021-09-26 DIAGNOSIS — L02512 Cutaneous abscess of left hand: Secondary | ICD-10-CM | POA: Diagnosis present

## 2021-09-26 DIAGNOSIS — M00071 Staphylococcal arthritis, right ankle and foot: Secondary | ICD-10-CM | POA: Diagnosis not present

## 2021-09-26 DIAGNOSIS — M7989 Other specified soft tissue disorders: Secondary | ICD-10-CM | POA: Diagnosis not present

## 2021-09-26 DIAGNOSIS — A419 Sepsis, unspecified organism: Secondary | ICD-10-CM | POA: Diagnosis not present

## 2021-09-26 DIAGNOSIS — M4640 Discitis, unspecified, site unspecified: Secondary | ICD-10-CM | POA: Diagnosis present

## 2021-09-26 DIAGNOSIS — L02414 Cutaneous abscess of left upper limb: Secondary | ICD-10-CM | POA: Diagnosis present

## 2021-09-26 DIAGNOSIS — M869 Osteomyelitis, unspecified: Secondary | ICD-10-CM | POA: Diagnosis present

## 2021-09-26 DIAGNOSIS — D509 Iron deficiency anemia, unspecified: Secondary | ICD-10-CM | POA: Diagnosis present

## 2021-09-26 DIAGNOSIS — F32A Depression, unspecified: Secondary | ICD-10-CM | POA: Diagnosis present

## 2021-09-26 DIAGNOSIS — Z23 Encounter for immunization: Secondary | ICD-10-CM

## 2021-09-26 DIAGNOSIS — Z79899 Other long term (current) drug therapy: Secondary | ICD-10-CM

## 2021-09-26 DIAGNOSIS — Z20822 Contact with and (suspected) exposure to covid-19: Secondary | ICD-10-CM | POA: Diagnosis present

## 2021-09-26 DIAGNOSIS — M729 Fibroblastic disorder, unspecified: Secondary | ICD-10-CM | POA: Diagnosis present

## 2021-09-26 DIAGNOSIS — R7881 Bacteremia: Secondary | ICD-10-CM | POA: Diagnosis not present

## 2021-09-26 DIAGNOSIS — E8809 Other disorders of plasma-protein metabolism, not elsewhere classified: Secondary | ICD-10-CM | POA: Diagnosis present

## 2021-09-26 DIAGNOSIS — D649 Anemia, unspecified: Secondary | ICD-10-CM | POA: Diagnosis present

## 2021-09-26 DIAGNOSIS — A4102 Sepsis due to Methicillin resistant Staphylococcus aureus: Secondary | ICD-10-CM | POA: Diagnosis present

## 2021-09-26 DIAGNOSIS — M009 Pyogenic arthritis, unspecified: Secondary | ICD-10-CM | POA: Diagnosis not present

## 2021-09-26 DIAGNOSIS — M4802 Spinal stenosis, cervical region: Secondary | ICD-10-CM | POA: Diagnosis present

## 2021-09-26 DIAGNOSIS — R197 Diarrhea, unspecified: Secondary | ICD-10-CM | POA: Diagnosis not present

## 2021-09-26 DIAGNOSIS — Z8249 Family history of ischemic heart disease and other diseases of the circulatory system: Secondary | ICD-10-CM

## 2021-09-26 DIAGNOSIS — B9562 Methicillin resistant Staphylococcus aureus infection as the cause of diseases classified elsewhere: Secondary | ICD-10-CM | POA: Diagnosis not present

## 2021-09-26 DIAGNOSIS — S60812D Abrasion of left wrist, subsequent encounter: Secondary | ICD-10-CM

## 2021-09-26 DIAGNOSIS — L02415 Cutaneous abscess of right lower limb: Secondary | ICD-10-CM | POA: Diagnosis present

## 2021-09-26 DIAGNOSIS — R5381 Other malaise: Secondary | ICD-10-CM

## 2021-09-26 DIAGNOSIS — R6520 Severe sepsis without septic shock: Secondary | ICD-10-CM | POA: Diagnosis present

## 2021-09-26 DIAGNOSIS — E876 Hypokalemia: Secondary | ICD-10-CM | POA: Diagnosis present

## 2021-09-26 DIAGNOSIS — G062 Extradural and subdural abscess, unspecified: Secondary | ICD-10-CM | POA: Diagnosis present

## 2021-09-26 DIAGNOSIS — M130 Polyarthritis, unspecified: Secondary | ICD-10-CM | POA: Diagnosis not present

## 2021-09-26 DIAGNOSIS — I340 Nonrheumatic mitral (valve) insufficiency: Secondary | ICD-10-CM

## 2021-09-26 DIAGNOSIS — D638 Anemia in other chronic diseases classified elsewhere: Secondary | ICD-10-CM | POA: Diagnosis present

## 2021-09-26 DIAGNOSIS — K5900 Constipation, unspecified: Secondary | ICD-10-CM | POA: Diagnosis present

## 2021-09-26 DIAGNOSIS — F17290 Nicotine dependence, other tobacco product, uncomplicated: Secondary | ICD-10-CM | POA: Diagnosis present

## 2021-09-26 DIAGNOSIS — M659 Synovitis and tenosynovitis, unspecified: Secondary | ICD-10-CM | POA: Diagnosis not present

## 2021-09-26 DIAGNOSIS — L02416 Cutaneous abscess of left lower limb: Secondary | ICD-10-CM | POA: Diagnosis present

## 2021-09-26 HISTORY — DX: Nausea with vomiting, unspecified: R11.2

## 2021-09-26 HISTORY — DX: Other specified postprocedural states: Z98.890

## 2021-09-26 LAB — CK: Total CK: 91 U/L (ref 38–234)

## 2021-09-26 LAB — CULTURE, BLOOD (ROUTINE X 2)

## 2021-09-26 LAB — CREATININE, SERUM
Creatinine, Ser: 0.66 mg/dL (ref 0.44–1.00)
GFR, Estimated: >60 mL/min (ref >60)

## 2021-09-26 LAB — CULTURE, BLOOD (SINGLE): Special Requests: ADEQUATE

## 2021-09-26 LAB — HEPATITIS A ANTIBODY, TOTAL: hep A Total Ab: NONREACTIVE

## 2021-09-26 LAB — C-REACTIVE PROTEIN: CRP: 23.9 mg/dL — ABNORMAL HIGH (ref <1.0)

## 2021-09-26 LAB — SEDIMENTATION RATE: Sed Rate: 59 mm/hr — ABNORMAL HIGH (ref 0–20)

## 2021-09-26 LAB — HEPATITIS B SURFACE ANTIGEN: Hepatitis B Surface Ag: NONREACTIVE

## 2021-09-26 IMAGING — MR MR KNEE*R* W/O CM
7 series · 40 of 40 positions shown · non-contrast
Comparison: Right knee x-rays dated [DATE].

CLINICAL DATA: Right knee pain and swelling for the past week. MRSA
bacteremia.

EXAM:
MRI OF THE RIGHT KNEE WITHOUT CONTRAST
TECHNIQUE: Multiplanar, multisequence MR imaging of the knee was performed. No
intravenous contrast was administered.

[Series 9: T2 fat-sat · axial · right · 4.0mm · 0.50mm/px · z∈[-62,+63]mm · 7 of 26 slices shown (1 of 3)]
[im 1/26]
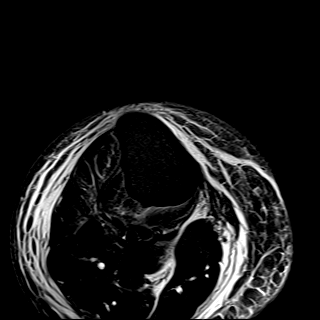
[im 5/26]
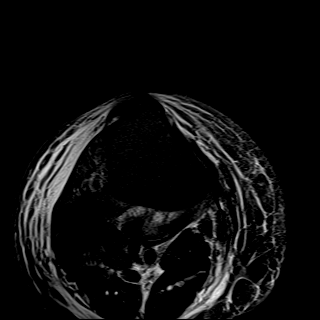
[im 9/26]
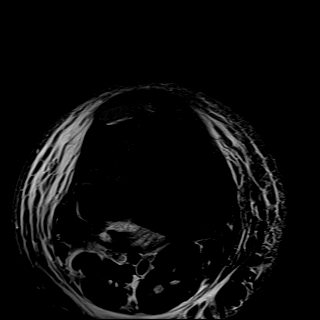
[im 13/26]
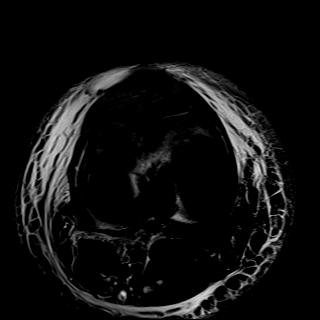
[im 17/26]
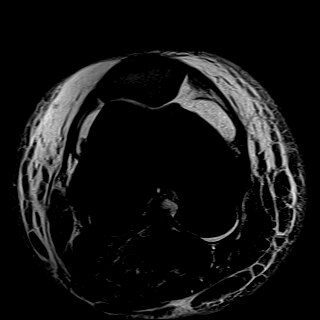
[im 21/26]
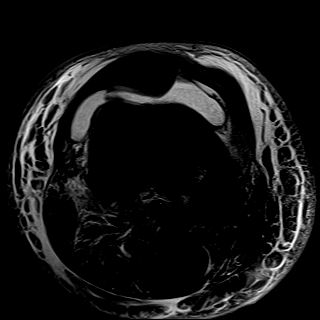
[im 26/26]
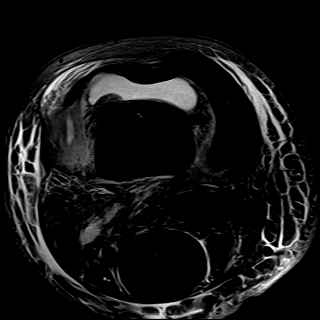

[Series 10: T1 · coronal · right · 4.0mm · 0.47mm/px · 5 of 24 slices shown]
[im 1/24]
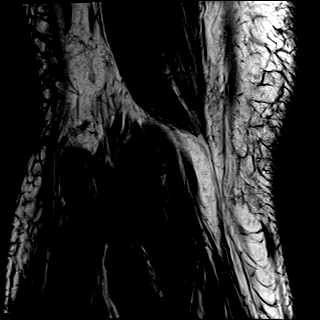
[im 6/24]
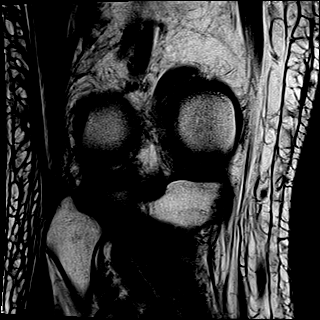
[im 12/24]
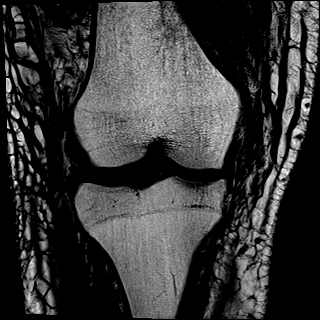
[im 18/24]
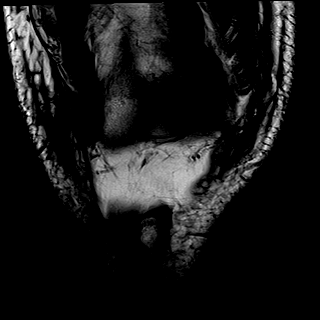
[im 24/24]
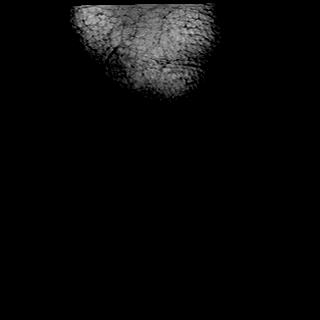

[Series 11: T2 fat-sat · coronal · right · 4.0mm · 0.47mm/px · 5 of 24 slices shown (2 of 3)]
[im 1/24]
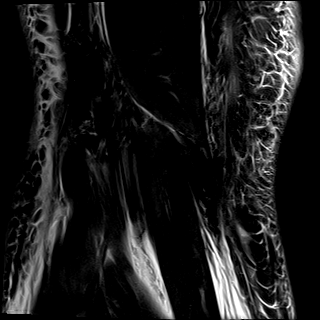
[im 6/24]
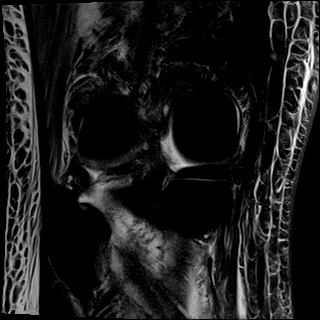
[im 12/24]
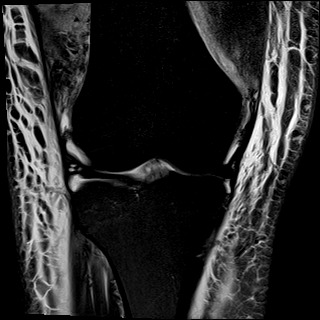
[im 18/24]
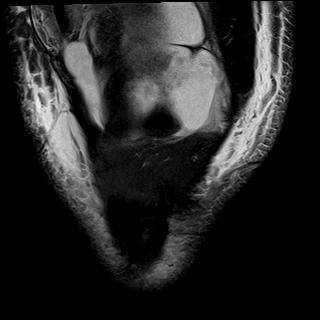
[im 24/24]
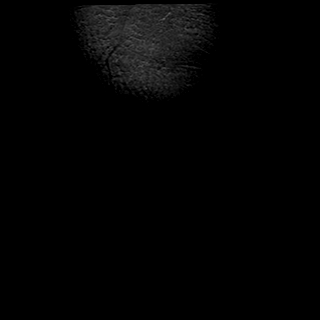

[Series 12: PD fat-sat · coronal · right · 4.0mm · 0.59mm/px · 5 of 24 slices shown (1 of 2)]
[im 1/24]
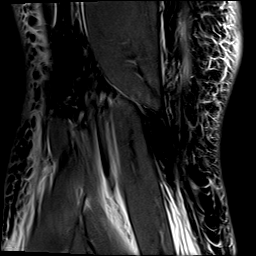
[im 6/24]
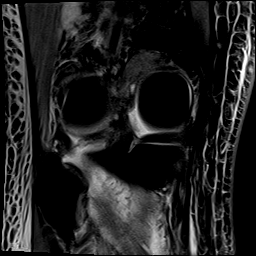
[im 12/24]
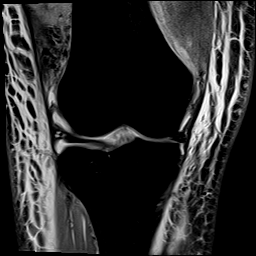
[im 18/24]
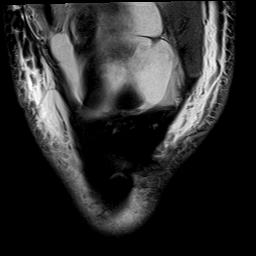
[im 24/24]
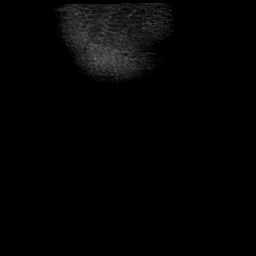

[Series 13: PD fat-sat · sagittal · right · 3.0mm · 0.47mm/px · 6 of 26 slices shown (2 of 2)]
[im 1/26]
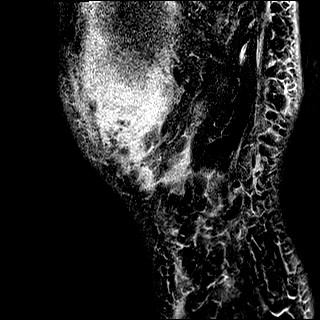
[im 6/26]
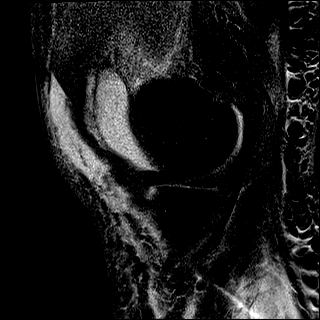
[im 11/26]
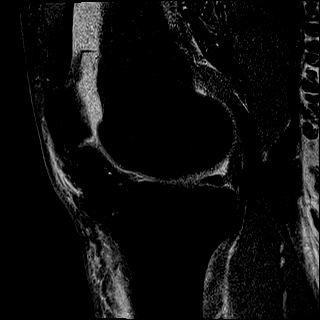
[im 16/26]
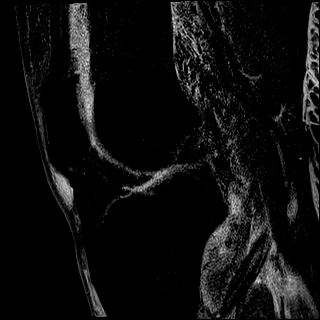
[im 21/26]
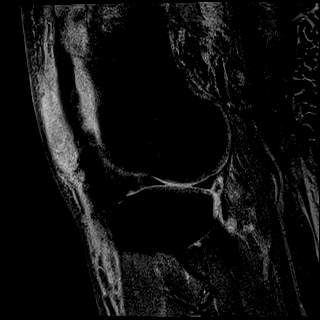
[im 26/26]
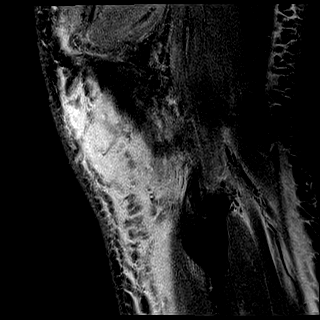

[Series 14: T2 fat-sat · sagittal · right · 3.0mm · 0.47mm/px · 6 of 26 slices shown (3 of 3)]
[im 1/26]
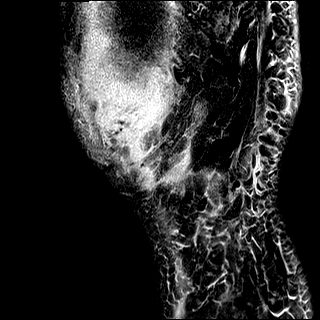
[im 6/26]
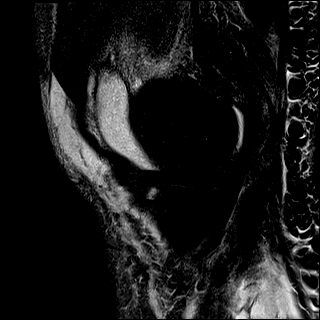
[im 11/26]
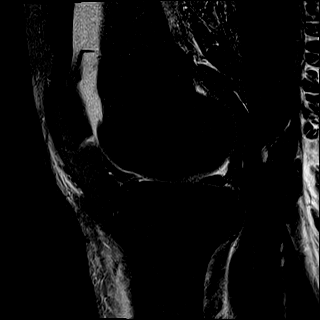
[im 16/26]
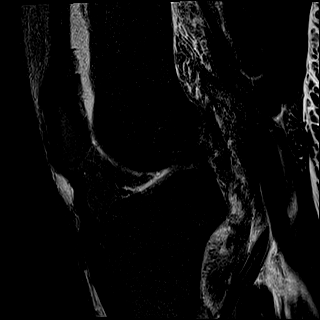
[im 21/26]
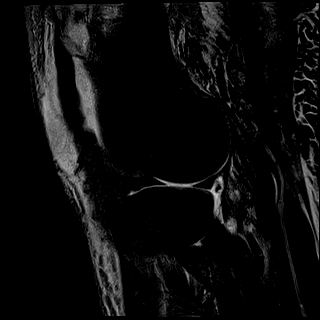
[im 26/26]
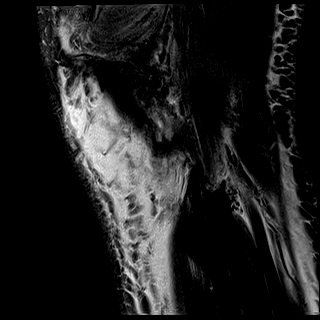

[Series 15: T1 fat-sat · axial · non-contrast · right · 4.0mm · 0.55mm/px · z∈[-63,+61]mm · 6 of 26 slices shown]
[im 1/26]
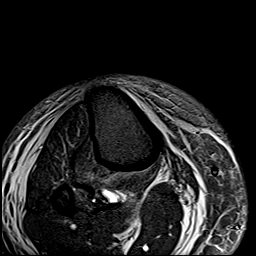
[im 6/26]
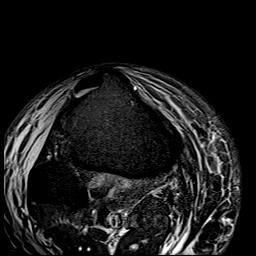
[im 11/26]
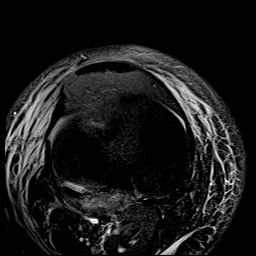
[im 16/26]
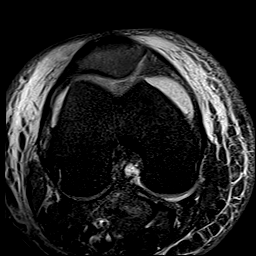
[im 21/26]
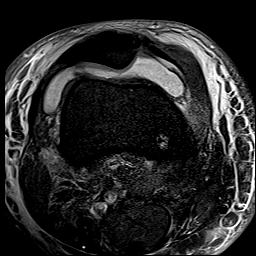
[im 26/26]
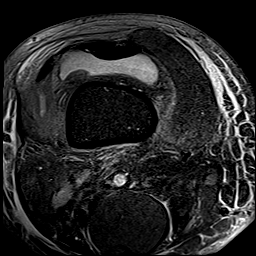

[40 of 40 positions shown; findings below may reference images not displayed]

FINDINGS: MENISCI

Medial meniscus:  Intact.

Lateral meniscus:  Intact.

LIGAMENTS

Cruciates:  Intact ACL and PCL.

Collaterals: Medial collateral ligament is intact. Lateral
collateral ligament complex is intact.

CARTILAGE

Patellofemoral: Focal partial-thickness fissuring over the lateral
patellar facet. No chondral defect.

Medial: Mild partial-thickness cartilage loss over the central
weight-bearing medial femoral condyle.

Lateral: Focal partial-thickness fissuring over the mesial lateral
tibial plateau. No chondral defect.

Joint:  Moderate joint effusion.  Normal Hoffa's fat.

Popliteal Fossa: No significant Baker cyst. Intact popliteus tendon.
Loculated fluid along the popliteus tendon sheath.

Extensor Mechanism: Intact quadriceps tendon and patellar tendon.
Intact medial and lateral patellar retinaculum. Intact MPFL.

Bones: No acute fracture or dislocation. No suspicious bone lesion.
1.1 cm enchondroma in the medial distal femoral metaphysis.

Other: Severe circumferential soft tissue swelling about the knee
without focal fluid collection.
IMPRESSION: 1. Moderate joint effusion, concerning for septic arthritis given
clinical history. No evidence of osteomyelitis.
2. Severe circumferential soft tissue swelling about the knee
without focal fluid collection.

## 2021-09-26 IMAGING — MR MR THORACIC SPINE W/O CM
6 series · 29 of 48 positions shown · non-contrast
Comparison: None.

CLINICAL DATA: Epidural abscess

EXAM:
MRI THORACIC SPINE WITHOUT CONTRAST
TECHNIQUE: Multiplanar, multisequence MR imaging of the thoracic spine was
performed. No intravenous contrast was administered.

[Series 16: T1 · sagittal · 5.0mm · 2.47mm/px · 2 of 9 slices shown (1 of 2)]
[im 1/9]
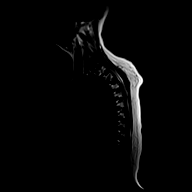
[im 9/9]
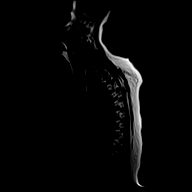

[Series 17: T2 · sagittal · 3.0mm · 1.06mm/px · 6 of 17 slices shown (1 of 2)]
[im 1/17]
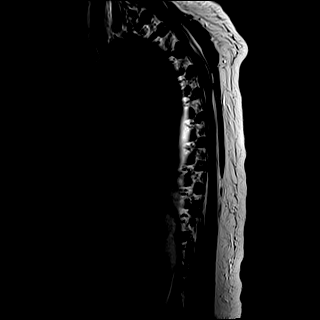
[im 4/17]
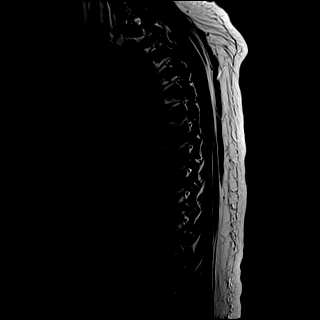
[im 7/17]
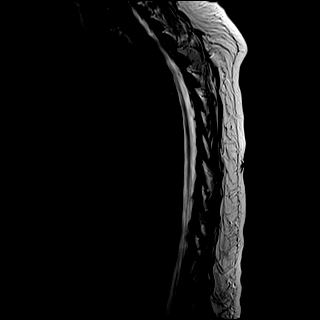
[im 10/17]
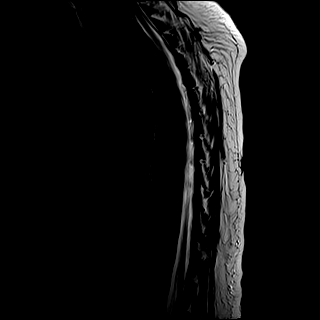
[im 13/17]
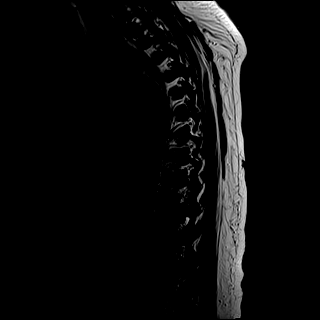
[im 17/17]
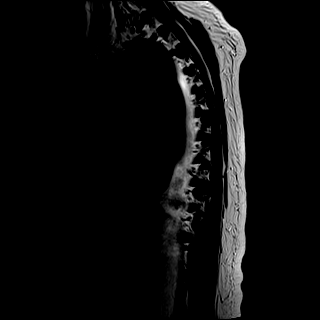

[Series 18: T1 · sagittal · 3.0mm · 1.06mm/px · 6 of 17 slices shown (2 of 2)]
[im 1/17]
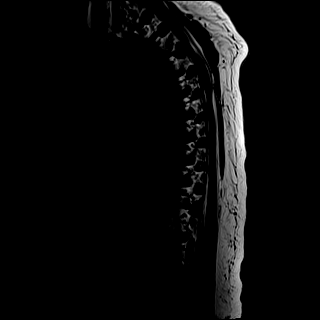
[im 4/17]
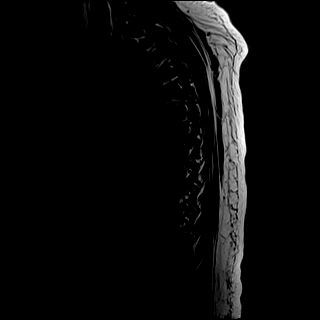
[im 7/17]
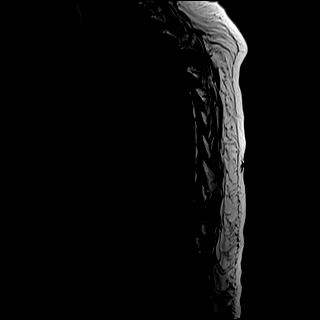
[im 10/17]
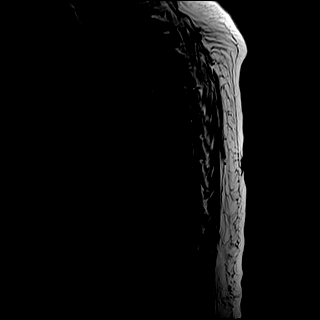
[im 13/17]
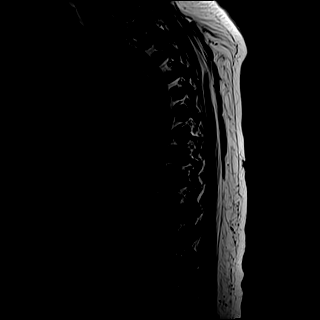
[im 17/17]
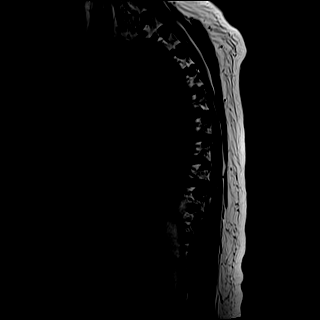

[Series 19: STIR · sagittal · 3.0mm · 0.53mm/px · 6 of 17 slices shown]
[im 1/17]
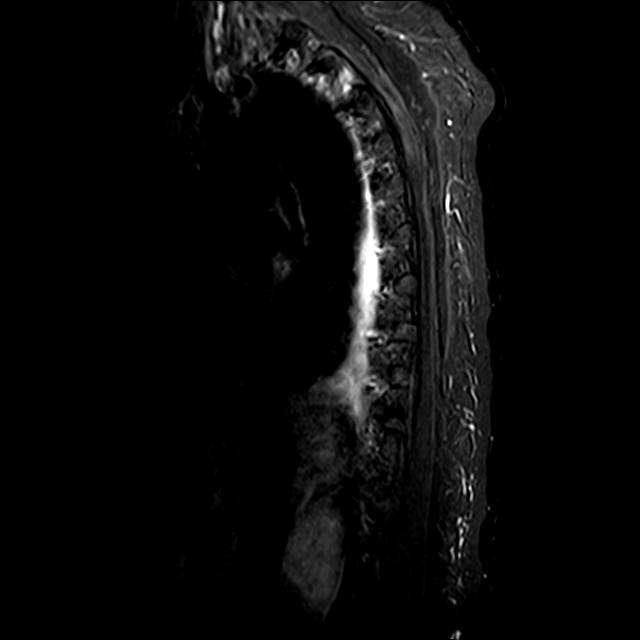
[im 4/17]
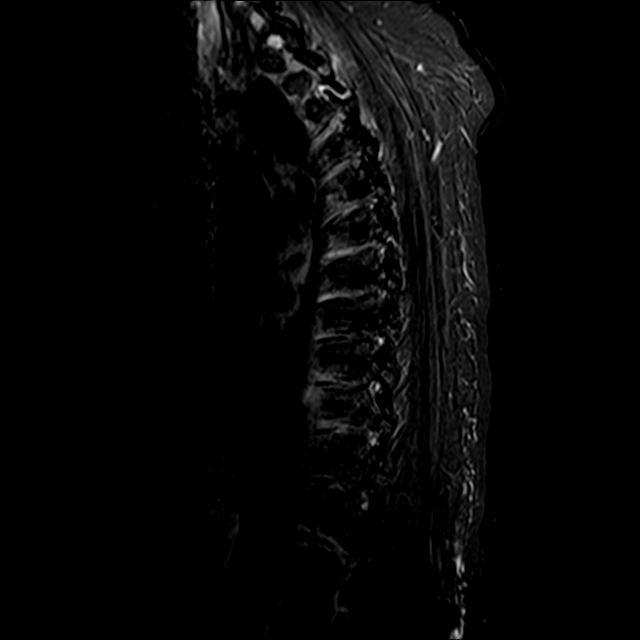
[im 7/17]
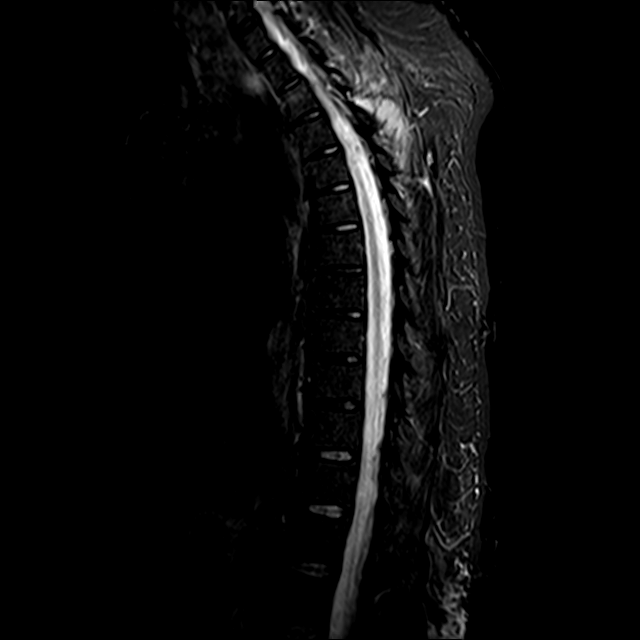
[im 10/17]
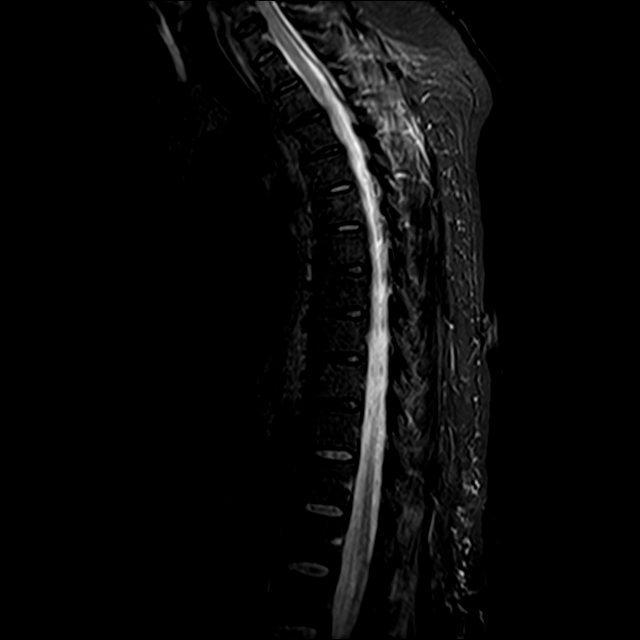
[im 13/17]
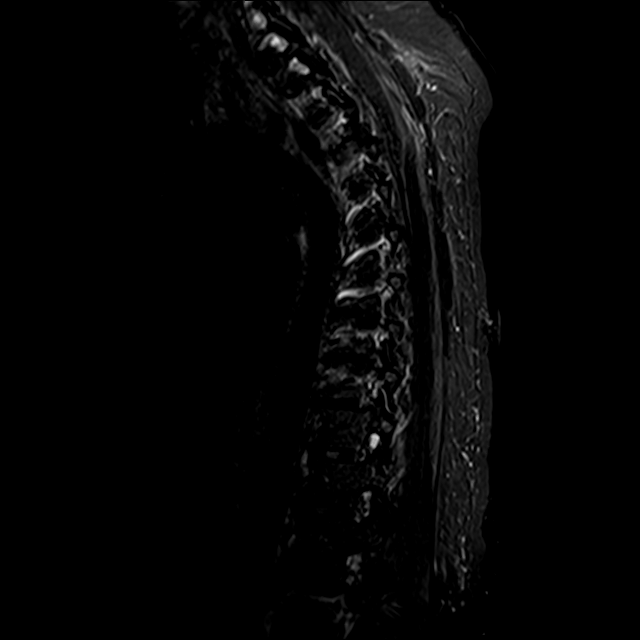
[im 17/17]
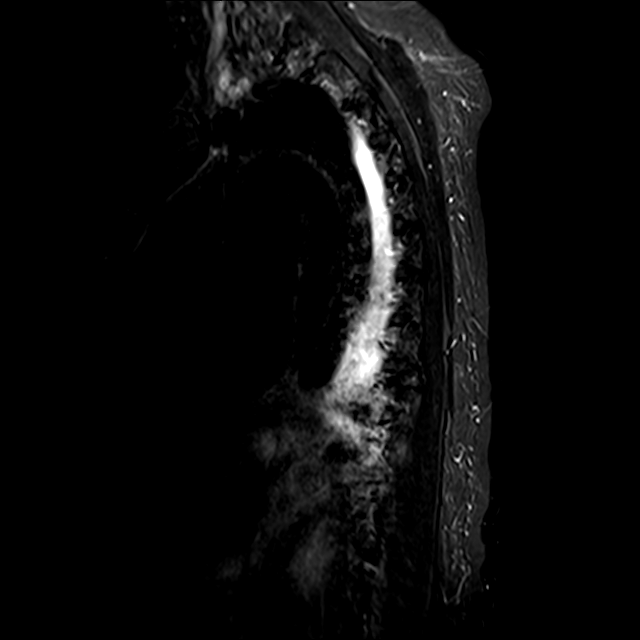

[Series 20: T2 · axial · 4.0mm · 0.59mm/px · z∈[-257,-10]mm · 8 of 42 slices shown (2 of 2)]
[im 1/42]
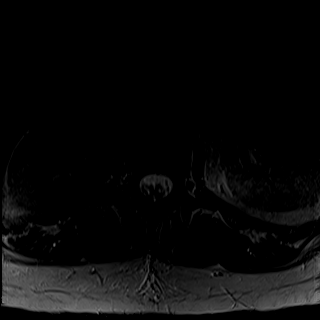
[im 7/42]
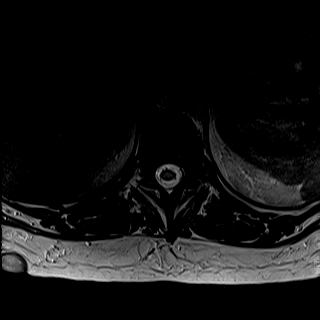
[im 13/42]
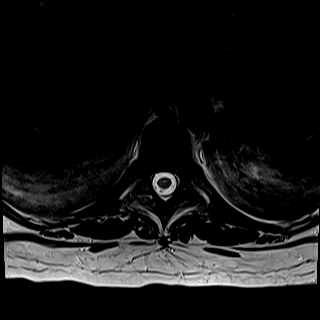
[im 19/42]
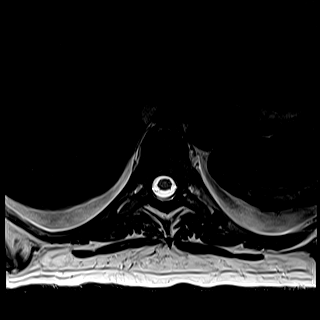
[im 23/42]
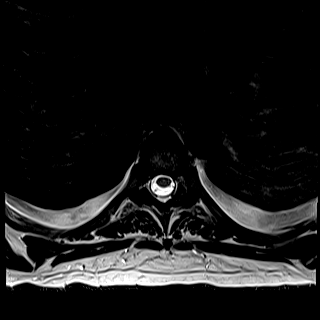
[im 29/42]
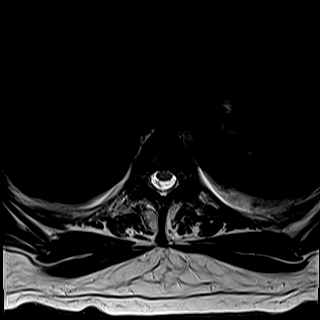
[im 35/42]
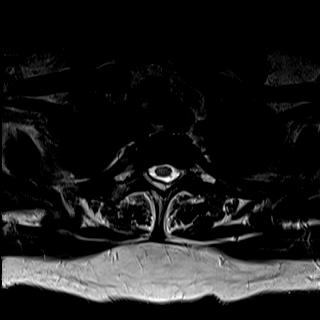
[im 42/42]
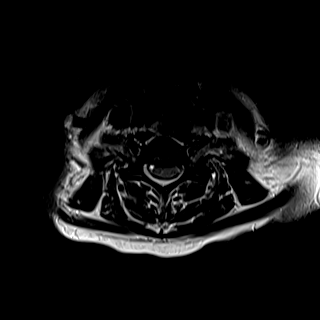

[Series 21: GRE · axial · 4.0mm · 0.37mm/px · 1 of 42 slices shown]
[im 1/42]
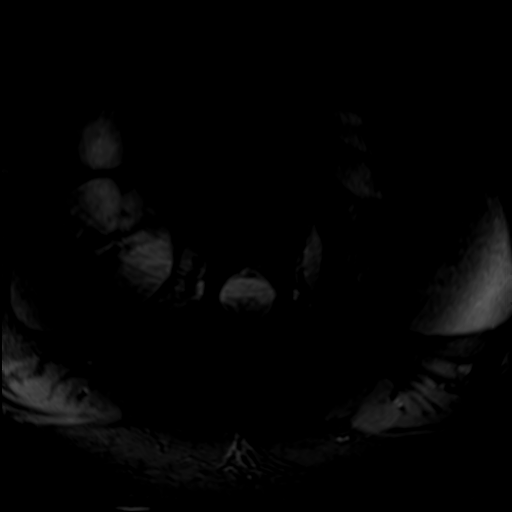

[29 of 48 positions shown; findings below may reference images not displayed]

FINDINGS: Alignment:  Physiologic.

Vertebrae: No evidence of acute fracture or aggressive osseous
lesion. There is no evidence of discitis.

Cord: There is slight anterior displacement of the cord above the
level of T2 but no effacement or abnormal cord signal.

Paraspinal and other soft tissues: Small bilateral pleural
effusions. There is a probable sebaceous cyst along the right lower
back measuring 1.3 x 1.4 cm (axial medic image 35).

Disc levels: There is a right facet effusion at T2-T3 with adjacent
edema and fluid collecting in the interspinous space (sagittal stir
images 4-9). There is adjacent paraspinal edema bilaterally. There
is a posterior epidural fluid collection, extending from the upper
thoracic spine into the cervical spine beyond the field of view,
inferior margin is at the level of T3-T4. This measures at maximum 5
mm in thickness the level of T2 (axial T2 image 9). This results in
mild canal narrowing without cord effacement. There is mild bony
edema within the right T2 facet and T2 spinous process.
IMPRESSION: Septic right facet joint at T2-T3 with associated posterior epidural
phlegmon/possible abscess measuring up to 5 mm in thickness,
extending from upper thoracic spine into the cervical spine beyond
the field of view. The inferior margin of this collection is at the
level of T3-T4. Adjacent paraspinal edema and an interspinous fluid
collecting at T2-T3 which may be infectious. Recommend cervical
spine MRI with and without contrast for further evaluation, with
inclusion of the T3-T4 level within the field of view if possible.

Small bilateral pleural effusions.

These results were called by telephone at the time of interpretation
on [DATE] at [DATE] to provider JISCARD , who verbally
acknowledged these results.

## 2021-09-26 IMAGING — MR MR ANKLE*R* W/O CM
7 series · 39 of 40 positions shown · non-contrast
Comparison: None.

CLINICAL DATA: Soft tissue infection. One-week history of thigh
pain and swelling.

EXAM:
MRI OF THE RIGHT ANKLE WITHOUT CONTRAST
TECHNIQUE: Multiplanar, multisequence MR imaging of the ankle was performed. No
intravenous contrast was administered.

[Series 4: PD fat-sat · axial · right · 3.0mm · 0.62mm/px · z∈[-10,+129]mm · 6 of 36 slices shown]
[im 1/36]
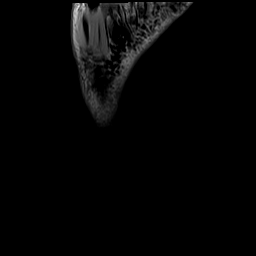
[im 8/36]
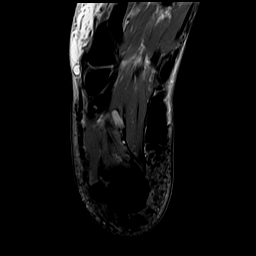
[im 15/36]
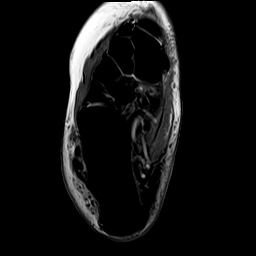
[im 22/36]
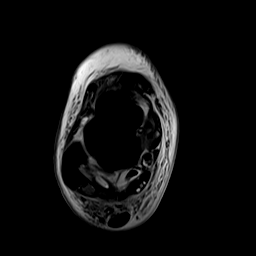
[im 29/36]
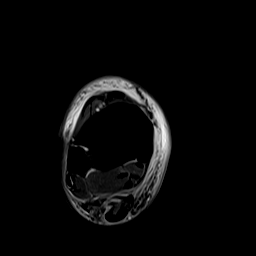
[im 36/36]
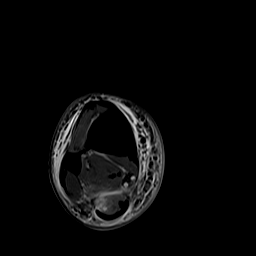

[Series 5: T2 fat-sat · axial · right · 3.0mm · 0.62mm/px · z∈[-10,+129]mm · 6 of 36 slices shown]
[im 1/36]
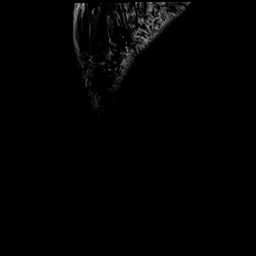
[im 8/36]
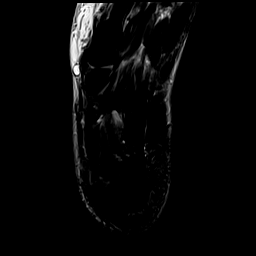
[im 15/36]
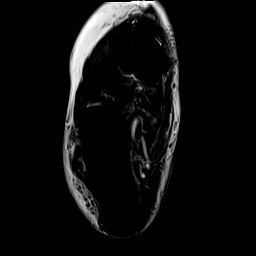
[im 22/36]
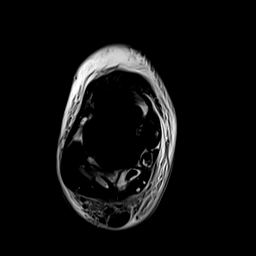
[im 29/36]
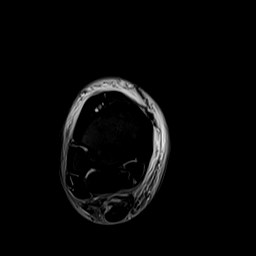
[im 36/36]
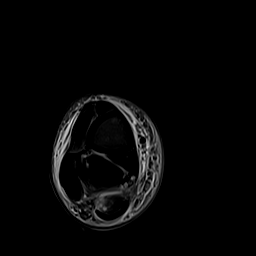

[Series 6: T1 fat-sat · axial · non-contrast · right · 3.0mm · 0.31mm/px · z∈[-10,+129]mm · 6 of 36 slices shown]
[im 1/36]
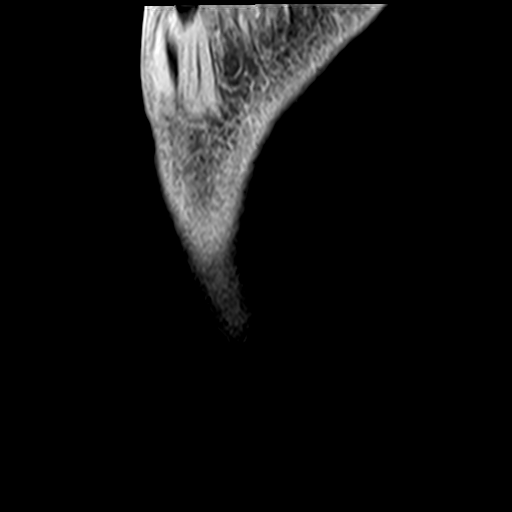
[im 8/36]
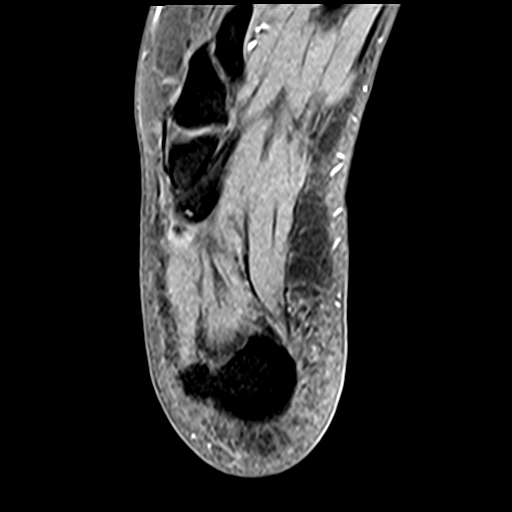
[im 15/36]
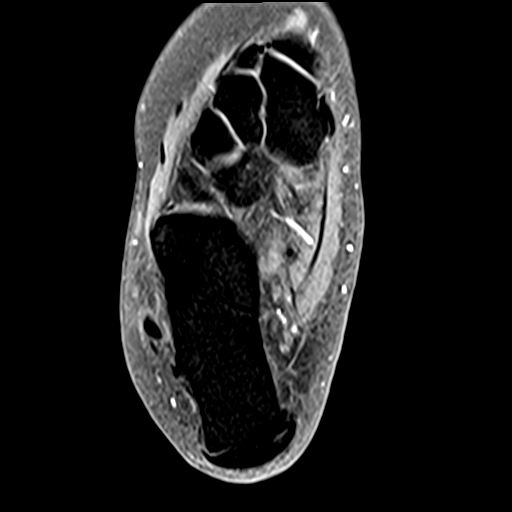
[im 22/36]
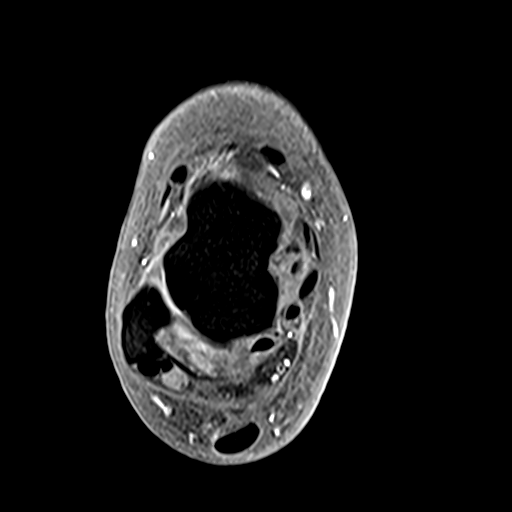
[im 29/36]
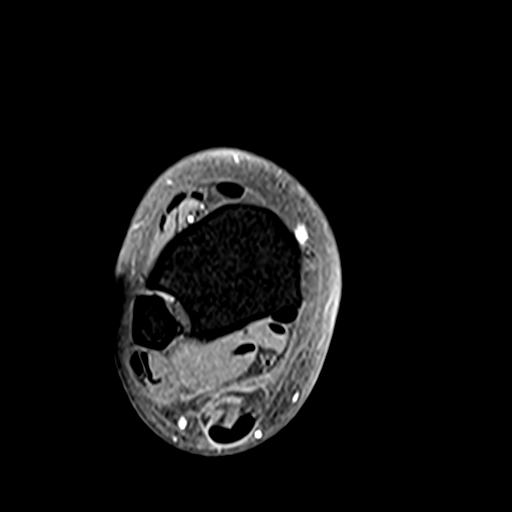
[im 36/36]
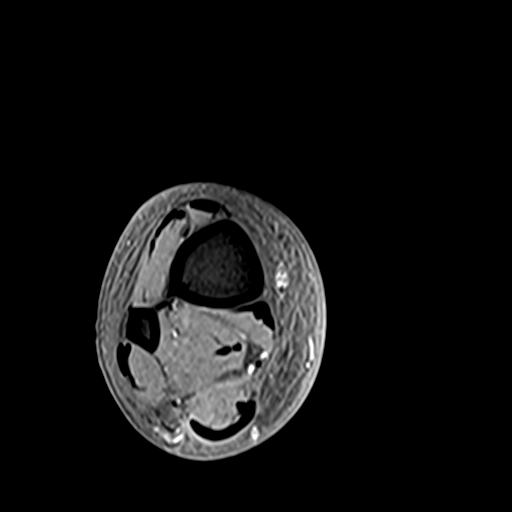

[Series 7: T2 · coronal · right · 3.0mm · 0.62mm/px · 7 of 40 slices shown (1 of 2)]
[im 1/40]
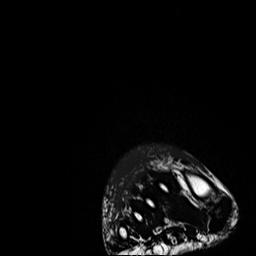
[im 7/40]
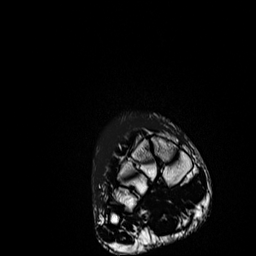
[im 14/40]
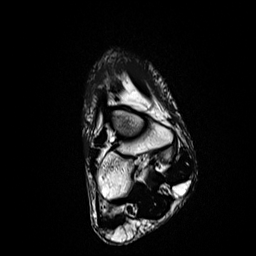
[im 20/40]
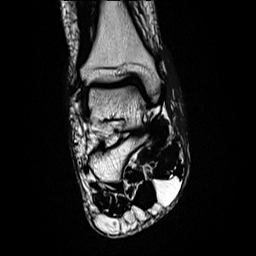
[im 27/40]
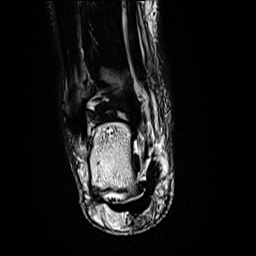
[im 33/40]
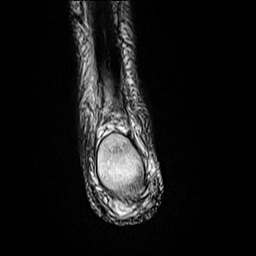
[im 40/40]
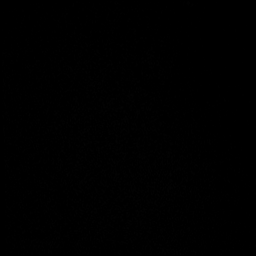

[Series 8: T2 · coronal · right · 3.0mm · 0.62mm/px · 7 of 40 slices shown (2 of 2)]
[im 1/40]
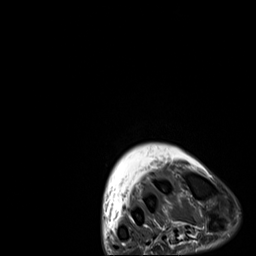
[im 7/40]
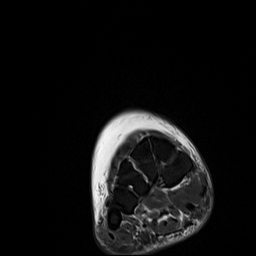
[im 14/40]
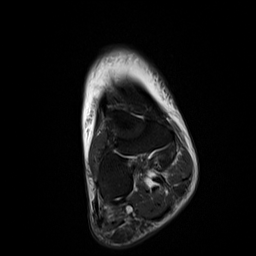
[im 20/40]
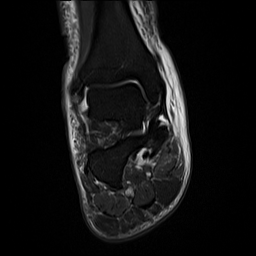
[im 27/40]
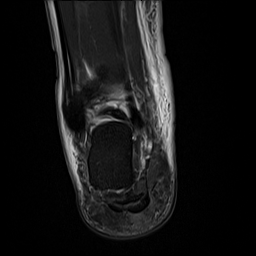
[im 33/40]
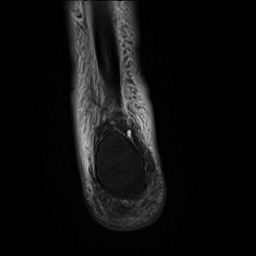
[im 40/40]
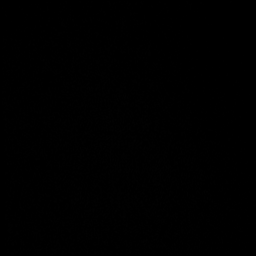

[Series 9: T1 · sagittal · right · 4.0mm · 0.70mm/px · 4 of 21 slices shown]
[im 1/21]
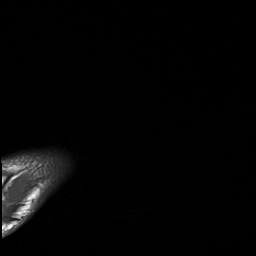
[im 7/21]
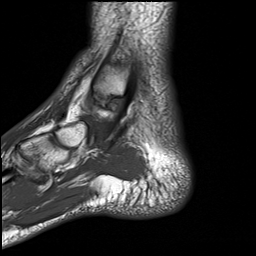
[im 14/21]
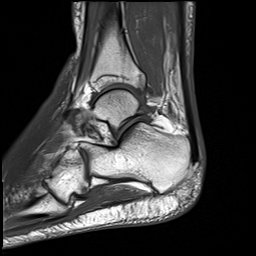
[im 21/21]
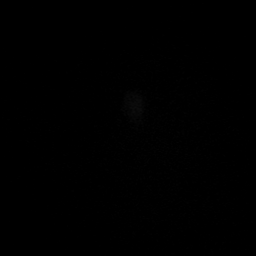

[Series 10: STIR · sagittal · right · 4.0mm · 0.35mm/px · 3 of 21 slices shown]
[im 1/21]
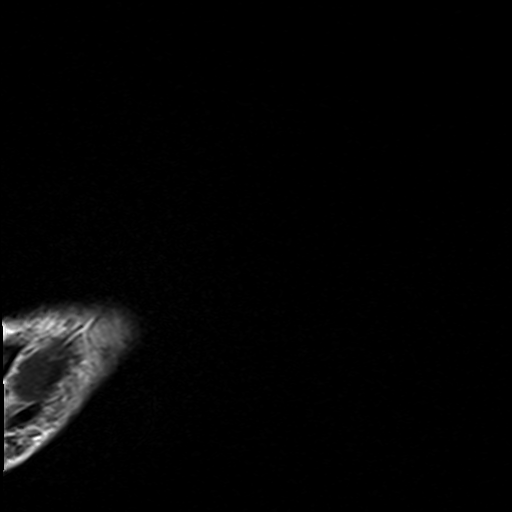
[im 7/21]
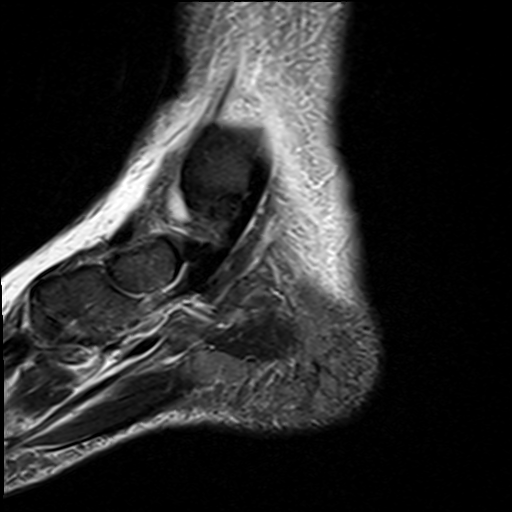
[im 14/21]
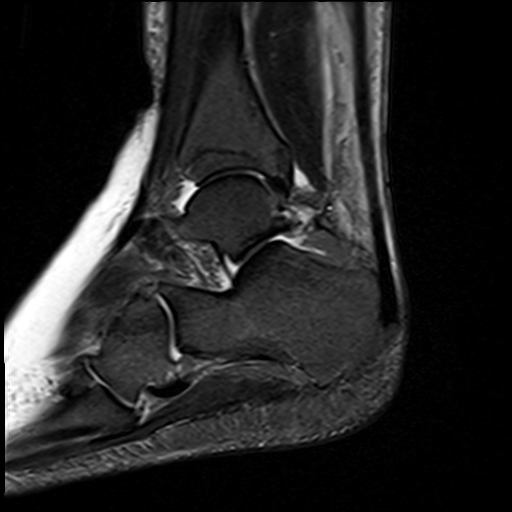

[39 of 40 positions shown; findings below may reference images not displayed]

FINDINGS: TENDONS

Peroneal: Mild tendinosis of the peroneus longus. Peroneal brevis
intact.

Posteromedial: Posterior tibial tendon intact. Flexor hallucis
longus tendon intact. Flexor digitorum longus tendon intact.

Anterior: Tibialis anterior tendon intact. Extensor hallucis longus
tendon intact Extensor digitorum longus tendon intact.

Achilles:  Intact.

Plantar Fascia: Intact.

LIGAMENTS

Lateral: Anterior talofibular ligament intact. Calcaneofibular
ligament intact. Posterior talofibular ligament intact. Anterior and
posterior tibiofibular ligaments intact.

Medial: Deltoid ligament intact. Spring ligament intact.

CARTILAGE

Ankle Joint: Small ankle joint effusion. Small focal osteochondral
lesion involving the medial corner of the talar dome with a
full-thickness cartilage defect measuring 4 x 5 mm with mild
subchondral reactive marrow edema. Normal ankle mortise. No chondral
defect.

Subtalar Joints/Sinus Tarsi: Normal subtalar joints. No subtalar
joint effusion. Normal sinus tarsi.

Bones: No aggressive osseous lesion. No fracture or dislocation.

Soft Tissue: No fluid collection or hematoma. Muscles are normal
without edema or atrophy. Tarsal tunnel is normal. Soft tissue edema
involving the right lower leg, ankle and extending into the foot.
Mild edema in Kager's fat.
IMPRESSION: 1. Mild tendinosis of the peroneus longus.
2. Nonspecific small right ankle joint effusion which may be
reactive/inflammatory secondary to cartilage loss, but septic
arthritis cannot be completely excluded.
3. Small focal osteochondral lesion involving the medial corner of
the talar dome with a full-thickness cartilage defect measuring 4 x
5 mm with mild subchondral reactive marrow edema.
4. Soft tissue edema involving the right lower leg, ankle and
extending into the foot which may be reactive secondary to fluid
overload or venous insufficiency versus cellulitis.

## 2021-09-26 IMAGING — MR MR WRIST*R* W/O CM
6 series · 40 of 40 positions shown · non-contrast
Comparison: None.

CLINICAL DATA: Septic arthritis suspected, wrist, no prior imaging

EXAM:
MR OF THE RIGHT WRIST WITHOUT CONTRAST
TECHNIQUE: Multiplanar, multisequence MR imaging of the right wrist was
performed. No intravenous contrast was administered.

[Series 8: T2 fat-sat · axial · right · 2.0mm · 0.47mm/px · z∈[-71,-24]mm · 7 of 25 slices shown (1 of 2)]
[im 1/25]
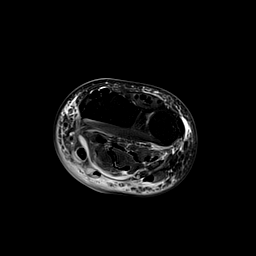
[im 5/25]
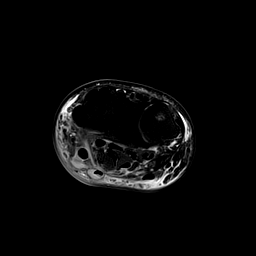
[im 9/25]
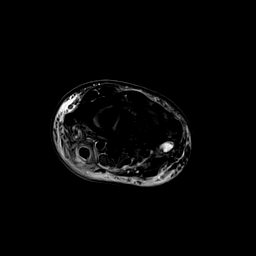
[im 13/25]
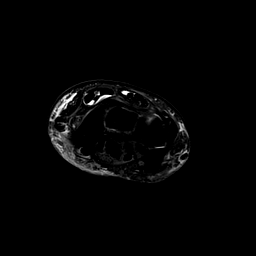
[im 17/25]
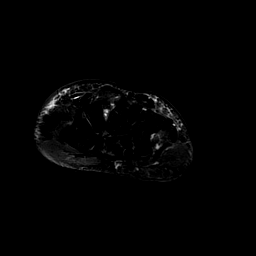
[im 21/25]
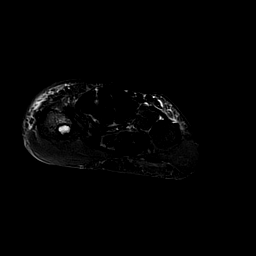
[im 25/25]
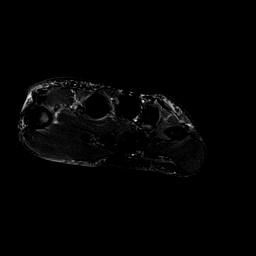

[Series 9: T1 · axial · right · 2.0mm · 0.47mm/px · z∈[-71,-24]mm · 8 of 25 slices shown (1 of 2)]
[im 1/25]
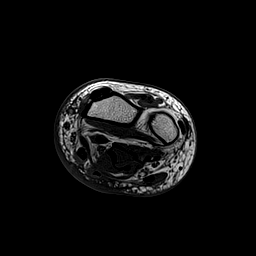
[im 4/25]
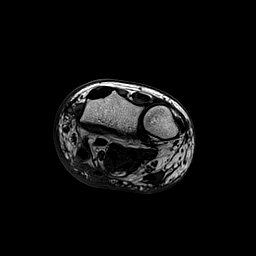
[im 7/25]
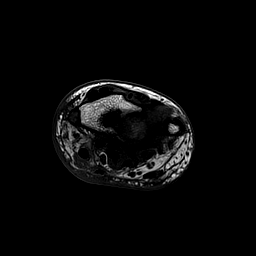
[im 11/25]
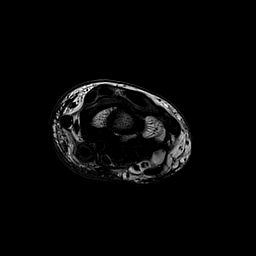
[im 14/25]
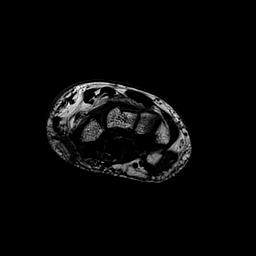
[im 18/25]
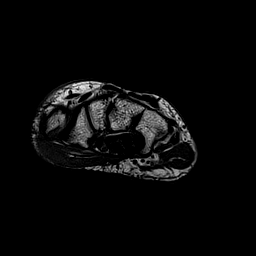
[im 21/25]
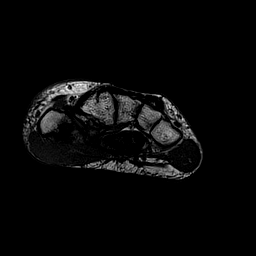
[im 25/25]
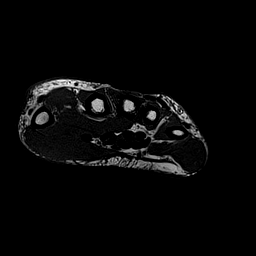

[Series 10: T1 · coronal · right · 3.0mm · 0.39mm/px · 5 of 15 slices shown (2 of 2)]
[im 1/15]
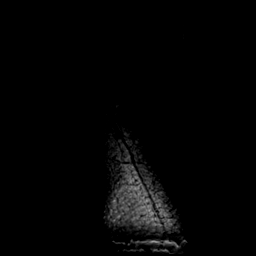
[im 4/15]
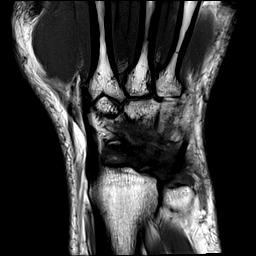
[im 8/15]
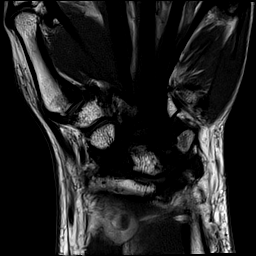
[im 11/15]
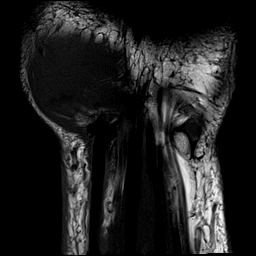
[im 15/15]
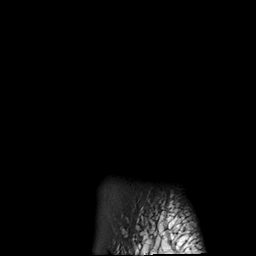

[Series 11: T2 fat-sat · coronal · right · 3.0mm · 0.39mm/px · 5 of 15 slices shown (2 of 2)]
[im 1/15]
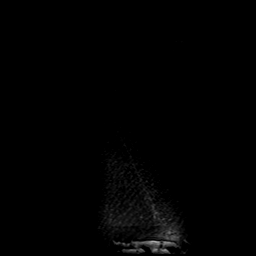
[im 4/15]
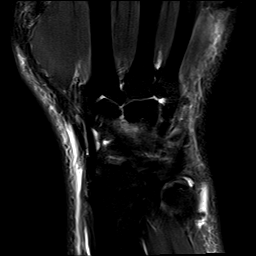
[im 8/15]
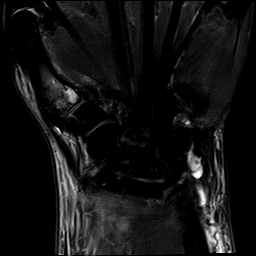
[im 11/15]
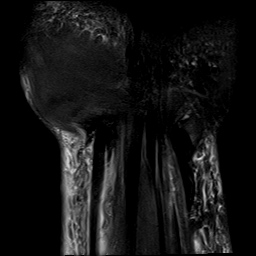
[im 15/15]
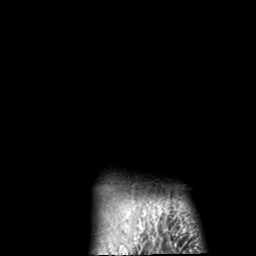

[Series 12: PD fat-sat · coronal · right · 3.0mm · 0.39mm/px · 5 of 15 slices shown (1 of 2)]
[im 1/15]
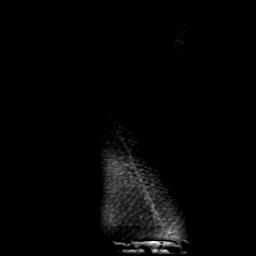
[im 4/15]
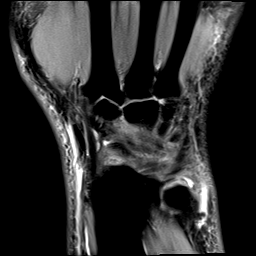
[im 8/15]
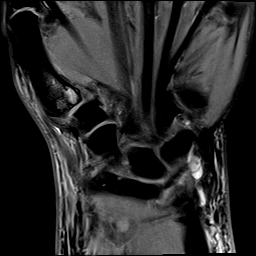
[im 11/15]
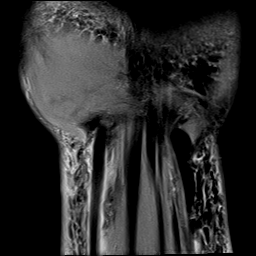
[im 15/15]
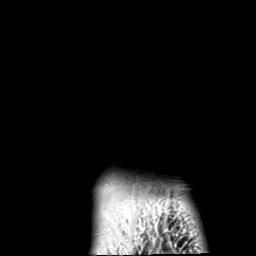

[Series 13: PD fat-sat · sagittal · right · 3.0mm · 0.39mm/px · 10 of 30 slices shown (2 of 2)]
[im 1/30]
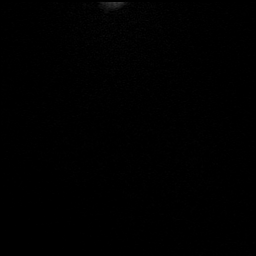
[im 4/30]
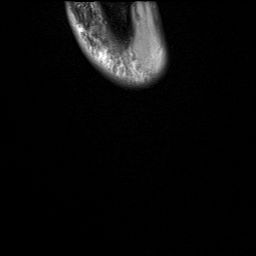
[im 7/30]
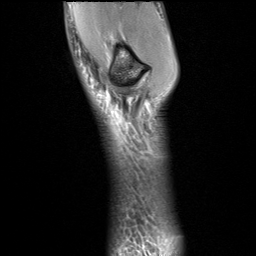
[im 10/30]
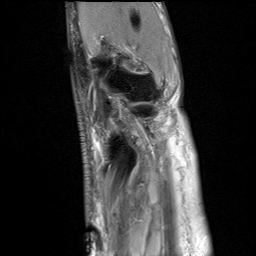
[im 13/30]
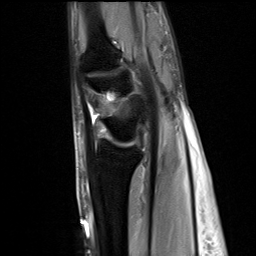
[im 17/30]
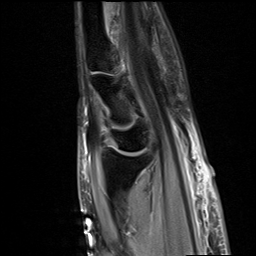
[im 20/30]
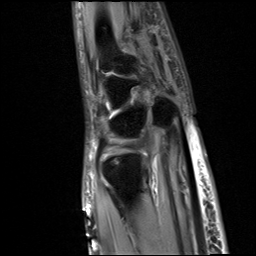
[im 23/30]
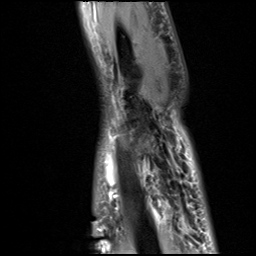
[im 26/30]
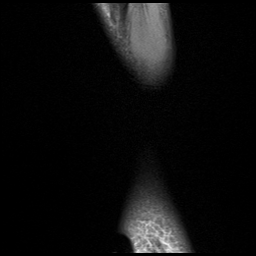
[im 30/30]
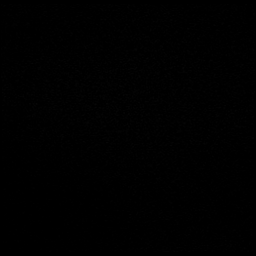

[40 of 40 positions shown; findings below may reference images not displayed]

FINDINGS: Ligaments: Scapholunate and lunotriquetral ligaments are intact.

Triangular fibrocartilage: Central perforation of the TFCC articular
disc.

Tendons: Mild focal tenosynovitis of extensor compartments 2 and 4
at the level of the midcarpal joints. There is tenosynovitis of the
flexor carpi radialis tendon just proximal to the wrist. The flexor
tendons in the carpal tunnel are unremarkable.

Carpal tunnel/median nerve: Flexor retinaculum is intact. Normal
carpal tunnel without a mass. Median nerve demonstrates normal
signal and caliber.

Guyon's canal: Normal Guyon's canal. Normal ulnar nerve.

Joint/cartilage: Mild chondrosis of the first carpometacarpal joint.
Mild radiocarpal chondrosis. There is minimal effusion of the DRUJ,
radiocarpal and intercarpal joints. Minimal joint fluid at the first
carpometacarpal joint.

Bones/carpal alignment: There is bony edema in the mid to proximal
first metacarpal with subchondral cystic lesion at the base of the
first metacarpal measuring 0.5 x 0.4 cm. There is preserved T1
signal in the region of edema in the metacarpal and low T1 signal
relation to the subchondral cyst

Other: There is either a ganglion cyst along the ulnar wrist or
fluid within the pisiform recess measuring 0.6 x 0.5 cm (axial T2
image 17). There is extensive soft tissue swelling along the radial
palm and more proximally in the wrist along the volar aspect. There
is deep fascial fluid between the flexor digitorum muscles and
tendons and the superficial flexor carpi tendons.
IMPRESSION: Extensive soft tissue swelling of the proximal wrist as can be seen
in cellulitis.

Tenosynovitis of the flexor carpi radialis tendon, possibly
infectious, with adjacent interfascial fluid between the flexor
carpi muscles and flexor digitorum just proximal to the wrist.

Bony edema within the mid to proximal first metacarpal with
preserved T1 signal, favored to be related to osteoarthritis at the
first carpometacarpal joint given marginal osteophyte formation and
presence of a subchondral cyst. No significant joint effusion at
this time to suggest this is related to septic arthritis of the
first CMC joint. If there are worsening symptoms, follow-up MRI
could be considered. No other marrow changes to suggest
osteomyelitis at the wrist

Mild focal tenosynovitis of the extensor compartments 2 and 4 at the
level of the mid carpus.

Central perforation of the TFCC articular disc.

## 2021-09-26 IMAGING — MR MR FEMUR*L* W/O CM
7 of 8 series · 37 of 40 positions shown · non-contrast
Comparison: None.

CLINICAL DATA: Bilateral thigh pain and swelling.  MRSA bacteremia.

EXAM:
MR OF THE LEFT FEMUR WITHOUT CONTRAST; MRI OF THE RIGHT FEMUR
WITHOUT CONTRAST
TECHNIQUE: Multiplanar, multisequence MR imaging of the bilateral femurs was
performed. No intravenous contrast was administered.

[Series 6: composed cor t1_comp · coronal · left · 5.6mm · 1.08mm/px · 4 of 34 slices shown]
[im 1/34]
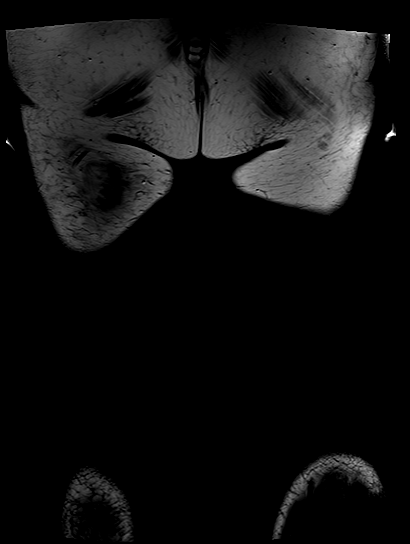
[im 12/34]
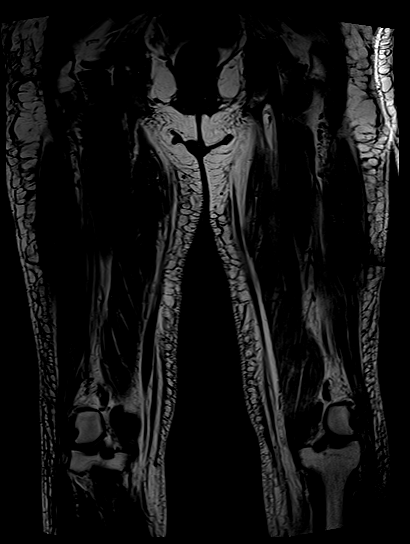
[im 23/34]
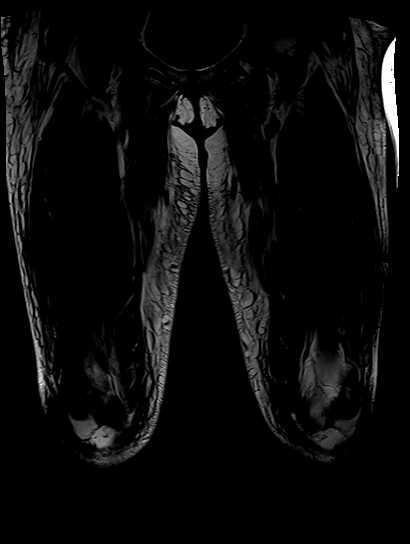
[im 34/34]
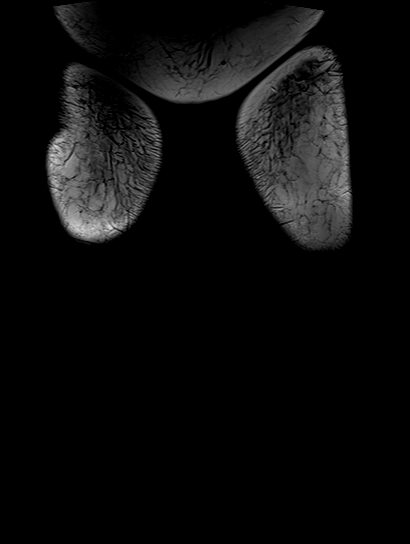

[Series 7: composed cor t1_comp_filt · coronal · left · 5.6mm · 1.08mm/px · 3 of 34 slices shown]
[im 1/34]
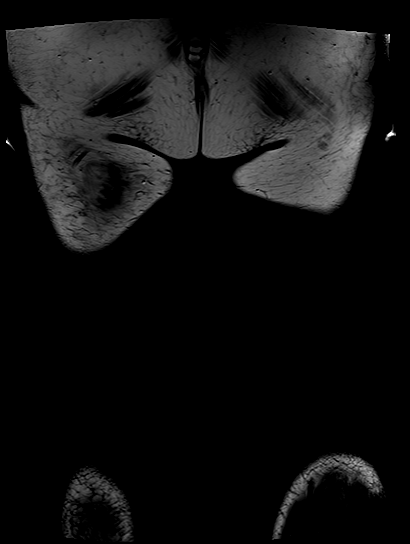
[im 17/34]
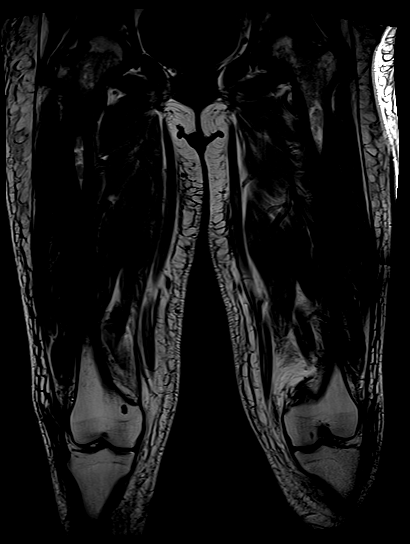
[im 34/34]
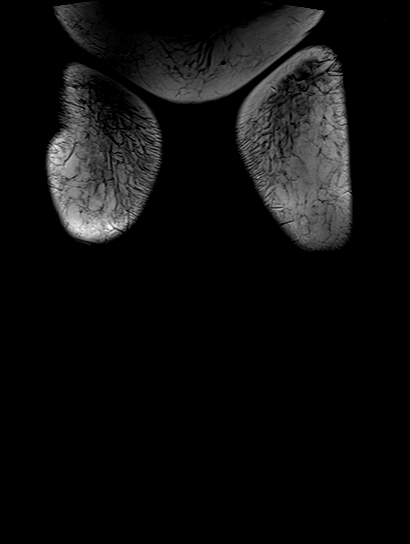

[Series 10: composed cor stir_comp · coronal · left · 5.6mm · 1.08mm/px · 3 of 34 slices shown]
[im 1/34]
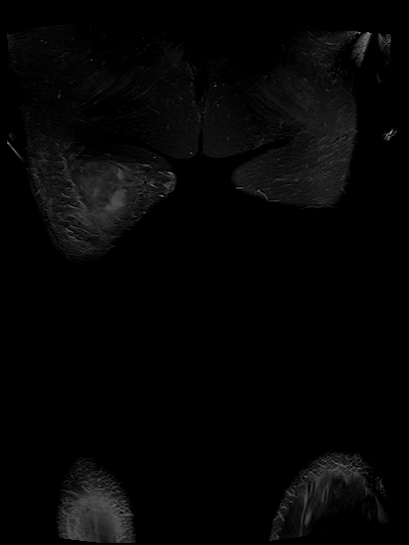
[im 17/34]
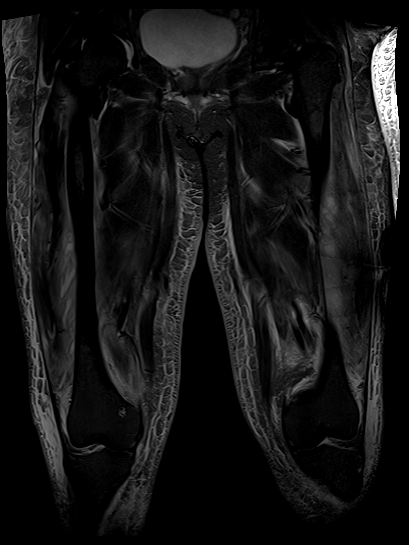
[im 34/34]
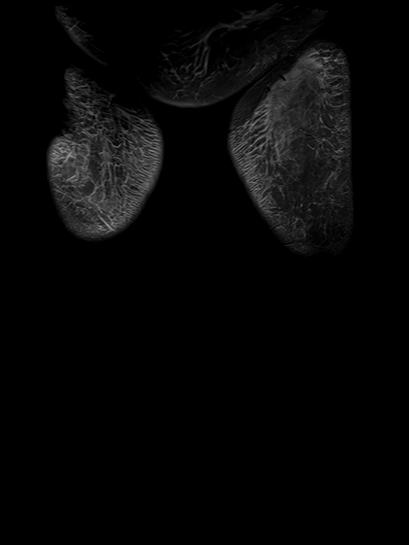

[Series 11: composed cor stir_comp_filt · coronal · left · 5.6mm · 1.08mm/px · 3 of 34 slices shown]
[im 1/34]
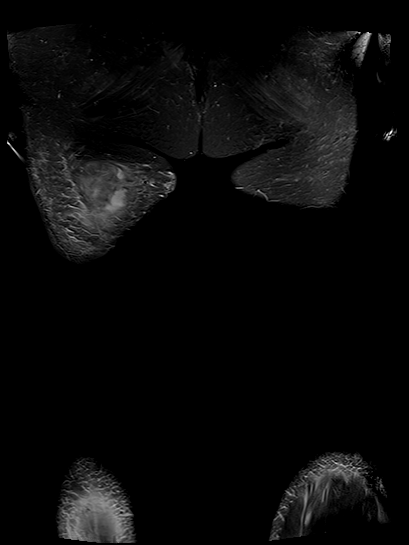
[im 17/34]
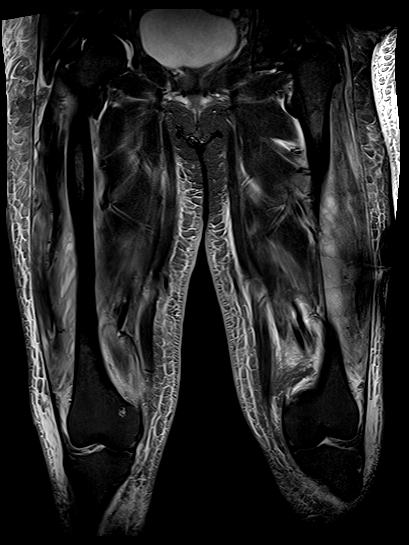
[im 34/34]
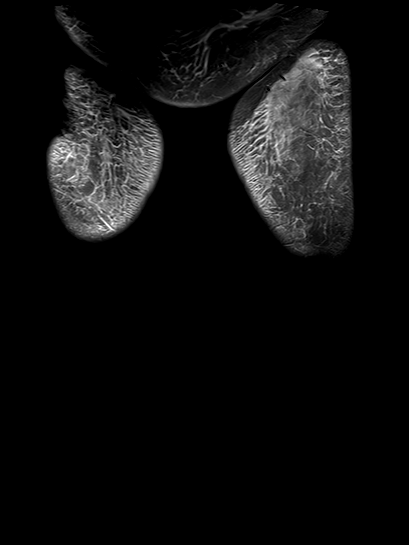

[Series 14: ax t1_comp · axial · left · 5.0mm · 0.86mm/px · z∈[-186,+383]mm · 8 of 96 slices shown]
[im 1/96]
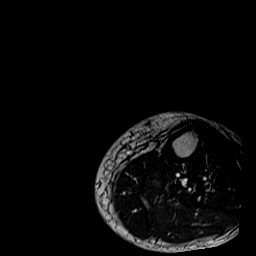
[im 14/96]
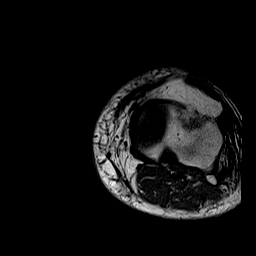
[im 28/96]
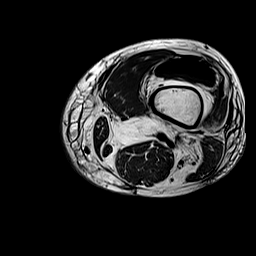
[im 41/96]
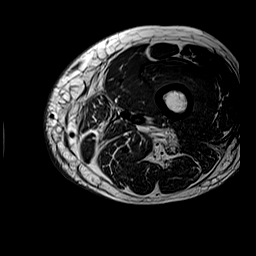
[im 55/96]
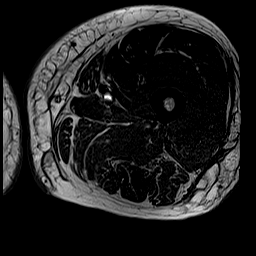
[im 68/96]
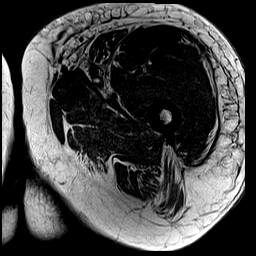
[im 82/96]
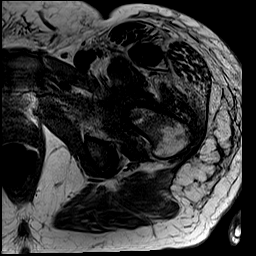
[im 96/96]
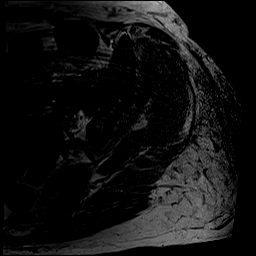

[Series 17: T2 · axial · left · 5.0mm · 0.86mm/px · z∈[-186,+383]mm · 8 of 96 slices shown]
[im 1/96]
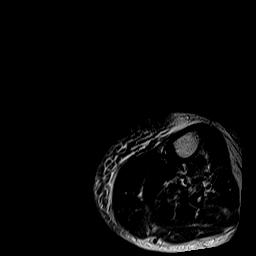
[im 14/96]
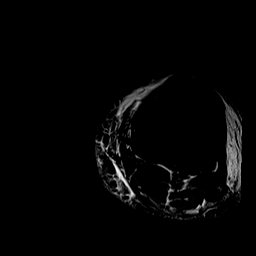
[im 28/96]
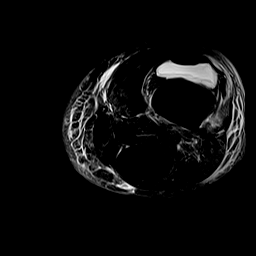
[im 41/96]
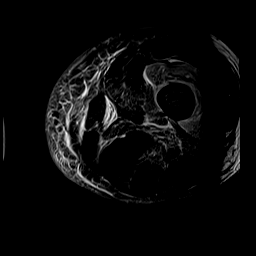
[im 55/96]
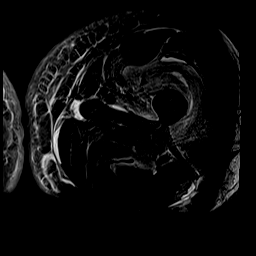
[im 68/96]
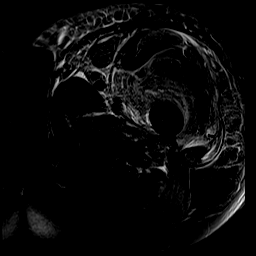
[im 82/96]
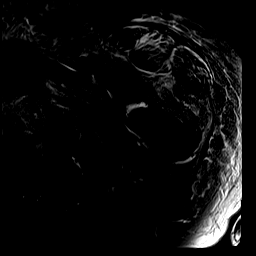
[im 96/96]
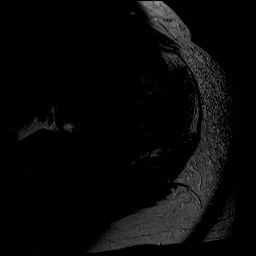

[Series 20: T1 fat-sat · axial · left · 5.0mm · 0.86mm/px · z∈[-186,+383]mm · 8 of 96 slices shown]
[im 1/96]
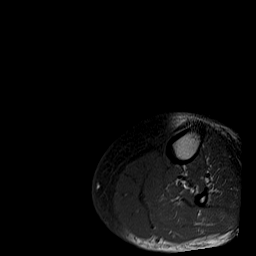
[im 14/96]
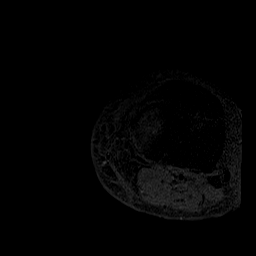
[im 28/96]
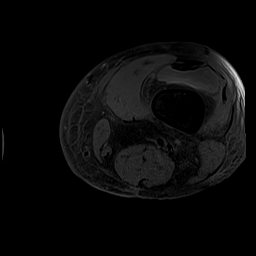
[im 41/96]
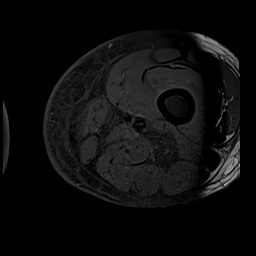
[im 55/96]
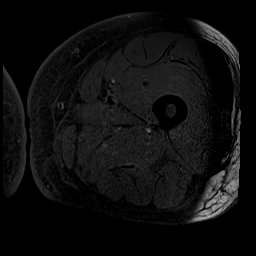
[im 68/96]
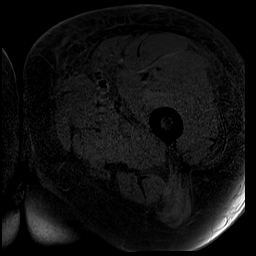
[im 82/96]
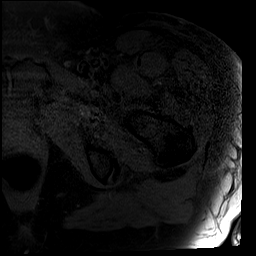
[im 96/96]
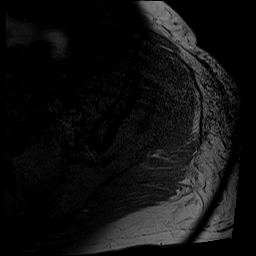

[37 of 40 positions shown; findings below may reference images not displayed]

FINDINGS: Bones/Joint/Cartilage

No marrow signal abnormality. Small enchondroma in the right medial
distal femoral metaphysis. No fracture or dislocation. Joint spaces
are preserved. Large bilateral knee and small bilateral hip joint
effusions.

Muscles and Tendons

Left thigh: Extensive edema involving the vastus intermedius muscle
with multiple small intramuscular fluid collections. Milder edema
involving the sartorius, rectus femoris, vastus medialis, and vastus
lateralis muscles. Small amount of interfascial fluid in the medial
and posterior muscle compartments without significant muscle edema.

Right thigh: Extensive edema involving the vastus intermedius muscle
with multiple small intramuscular fluid collections. Prominent
interfascial fluid collection between the vastus intermedius and
rectus femoris muscles. Patchy edema in the vastus lateralis and
adductor magnus muscles with multiple small intramuscular fluid
collections. Prominent interfascial fluid collection between the
hamstring muscles and adductor magnus muscle.

Soft tissue
Severe soft tissue swelling of both thighs. No focal subcutaneous
fluid collection. No soft tissue mass. Reactive bilateral inguinal
lymph nodes. Small amount of free fluid in the pelvis.
IMPRESSION: 1. Extensive infectious myofasciitis of the thighs, primarily
involving the anterior compartments bilaterally and the right medial
and posterior compartments.
2. Multiple intramuscular abscesses involving the bilateral vastus
intermedius muscles, right vastus lateralis muscle, and right
adductor magnus muscle.
3. Large bilateral knee joint effusions concerning for septic
arthritis. Small bilateral hip joint effusions are more nonspecific,
although septic arthritis should be considered until proven
otherwise given clinical and imaging findings elsewhere.

## 2021-09-26 SURGERY — ECHOCARDIOGRAM, TRANSESOPHAGEAL
Anesthesia: Moderate Sedation

## 2021-09-26 MED ORDER — ONDANSETRON HCL 4 MG/2ML IJ SOLN: 4.0000 mg | Freq: Three times a day (TID) | INTRAMUSCULAR | 0 refills | Status: DC | PRN

## 2021-09-26 MED ORDER — VANCOMYCIN HCL 1000 MG/200ML IV SOLN: 1000.0000 mg | Freq: Two times a day (BID) | INTRAVENOUS | Status: DC

## 2021-09-26 MED ORDER — FENTANYL CITRATE (PF) 100 MCG/2ML IJ SOLN
INTRAMUSCULAR | Status: AC
  Filled 2021-09-26: qty 2

## 2021-09-26 MED ORDER — MIDAZOLAM HCL 2 MG/2ML IJ SOLN
INTRAMUSCULAR | Status: AC
  Filled 2021-09-26: qty 4

## 2021-09-26 MED ORDER — MIDAZOLAM HCL 2 MG/2ML IJ SOLN
INTRAMUSCULAR | Status: AC | PRN
  Administered 2021-09-26: 2 mg via INTRAVENOUS

## 2021-09-26 MED ORDER — OXYCODONE-ACETAMINOPHEN 5-325 MG PO TABS: 1.0000 | ORAL_TABLET | ORAL | 0 refills | Status: DC | PRN

## 2021-09-26 MED ORDER — ACETAMINOPHEN 325 MG PO TABS: 650.0000 mg | ORAL_TABLET | Freq: Four times a day (QID) | ORAL | Status: AC | PRN

## 2021-09-26 MED ORDER — FENTANYL CITRATE (PF) 100 MCG/2ML IJ SOLN
INTRAMUSCULAR | Status: AC | PRN
  Administered 2021-09-26: 50 ug via INTRAVENOUS

## 2021-09-26 MED ORDER — LIDOCAINE VISCOUS HCL 2 % MT SOLN
OROMUCOSAL | Status: AC
  Filled 2021-09-26: qty 15

## 2021-09-26 MED ORDER — PROCHLORPERAZINE EDISYLATE 10 MG/2ML IJ SOLN: 10.0000 mg | Freq: Four times a day (QID) | INTRAMUSCULAR | Status: DC | PRN

## 2021-09-26 MED ORDER — NICOTINE 21 MG/24HR TD PT24: 21.0000 mg | MEDICATED_PATCH | Freq: Every day | TRANSDERMAL | 0 refills | Status: DC

## 2021-09-26 MED ORDER — BUTAMBEN-TETRACAINE-BENZOCAINE 2-2-14 % EX AERO
INHALATION_SPRAY | CUTANEOUS | Status: AC
  Filled 2021-09-26: qty 5

## 2021-09-26 MED ORDER — PROCHLORPERAZINE EDISYLATE 10 MG/2ML IJ SOLN
10.0000 mg | Freq: Four times a day (QID) | INTRAMUSCULAR | Status: DC | PRN
Start: 2021-09-26 — End: 2021-09-26
  Administered 2021-09-26: 10 mg via INTRAVENOUS
  Filled 2021-09-26 (×2): qty 2

## 2021-09-26 MED ORDER — BUTAMBEN-TETRACAINE-BENZOCAINE 2-2-14 % EX AERO
INHALATION_SPRAY | CUTANEOUS | Status: AC | PRN
  Administered 2021-09-26: 1 via TOPICAL

## 2021-09-26 MED ORDER — SODIUM CHLORIDE FLUSH 0.9 % IV SOLN
INTRAVENOUS | Status: AC
  Filled 2021-09-26: qty 10

## 2021-09-26 MED ORDER — ENOXAPARIN SODIUM 40 MG/0.4ML IJ SOSY: 40.0000 mg | PREFILLED_SYRINGE | INTRAMUSCULAR | Status: DC

## 2021-09-26 MED ORDER — HYDROMORPHONE HCL 1 MG/ML IJ SOLN: 0.5000 mg | INTRAMUSCULAR | 0 refills | Status: DC | PRN

## 2021-09-26 NOTE — Progress Notes (Signed)
? ? ? ? ? ? ?Subjective: ?Pink continues have pain in multiple joints. ? ? ?Antibiotics:  ?Anti-infectives (From admission, onward)  ? ? Start     Dose/Rate Route Frequency Ordered Stop  ? 09/24/21 2300  [MAR Hold]  vancomycin (VANCOREADY) IVPB 1000 mg/200 mL        (MAR Hold since Tue 09/26/2021 at 1232.Hold Reason: Transfer to a Procedural area)  ? 1,000 mg ?200 mL/hr over 60 Minutes Intravenous Every 12 hours 09/24/21 1243    ? 09/24/21 1030  piperacillin-tazobactam (ZOSYN) IVPB 3.375 g       ? 3.375 g ?100 mL/hr over 30 Minutes Intravenous  Once 09/24/21 1024 09/24/21 1123  ? 09/24/21 1030  vancomycin (VANCOREADY) IVPB 2000 mg/400 mL       ? 2,000 mg ?200 mL/hr over 120 Minutes Intravenous  Once 09/24/21 1025 09/24/21 1255  ? 09/24/21 1026  vancomycin variable dose per unstable renal function (pharmacist dosing)  Status:  Discontinued       ?  Does not apply See admin instructions 09/24/21 1026 09/24/21 1243  ? ?  ? ? ?Medications: ?Scheduled Meds: ? butamben-tetracaine-benzocaine      ? [MAR Hold] enoxaparin (LOVENOX) injection  40 mg Subcutaneous Q24H  ? fentaNYL      ? lidocaine      ? midazolam      ? [MAR Hold] nicotine  21 mg Transdermal Daily  ? [MAR Hold] sertraline  25 mg Oral q morning  ? sodium chloride flush      ? ?Continuous Infusions: ? sodium chloride 20 mL/hr at 09/26/21 [id number]  ? [MAR Hold] famotidine (PEPCID) IV 100 mL/hr at 09/25/21 1553  ? [MAR Hold] vancomycin 1,000 mg (09/26/21 1038)  ? ?PRN Meds:.[MAR Hold] acetaminophen, [MAR Hold] cyclobenzaprine, [MAR Hold] famotidine (PEPCID) IV, [MAR Hold]  HYDROmorphone (DILAUDID) injection, [MAR Hold] ondansetron (ZOFRAN) IV, [MAR Hold] oxyCODONE-acetaminophen ? ? ? ?Objective: ?Weight change:  ? ?Intake/Output Summary (Last 24 hours) at 09/26/2021 1310 ?Last data filed at 09/26/2021 [id number] ?Gross per 24 hour  ?Intake 1394.24 ml  ?Output 550 ml  ?Net 844.24 ml  ? ?Blood pressure 109/60, pulse 88, temperature 99.3 ?F (37.4 ?C), temperature source Oral,  resp. rate 16, height 5\' 6"  (1.676 m), weight 81.6 kg, SpO2 96 %. ?Temp:  [98 ?F (36.7 ?C)-100.1 ?F (37.8 ?C)] 99.3 ?F (37.4 ?C) (03/14 1218) ?Pulse Rate:  [80-118] 88 (03/14 1300) ?Resp:  [16-18] 16 (03/14 1300) ?BP: (92-140)/(40-73) 109/60 (03/14 1300) ?SpO2:  [94 %-98 %] 96 % (03/14 1300) ?Weight:  [81.6 kg] 81.6 kg (03/14 1218) ? ?Physical Exam: ?Physical Exam ?Constitutional:   ?   General: She is not in acute distress. ?   Appearance: She is well-developed. She is obese. She is not diaphoretic.  ?HENT:  ?   Head: Normocephalic and atraumatic.  ?   Right Ear: External ear normal.  ?   Left Ear: External ear normal.  ?   Mouth/Throat:  ?   Pharynx: No oropharyngeal exudate.  ?Eyes:  ?   General: No scleral icterus. ?   Conjunctiva/sclera: Conjunctivae normal.  ?   Pupils: Pupils are equal, round, and reactive to light.  ?Cardiovascular:  ?   Rate and Rhythm: Normal rate and regular rhythm.  ?   Heart sounds: Normal heart sounds. No murmur heard. ?  No friction rub. No gallop.  ?Pulmonary:  ?   Effort: Pulmonary effort is normal. No respiratory distress.  ?   Breath sounds: Normal breath sounds.  No stridor. No wheezing, rhonchi or rales.  ?Abdominal:  ?   General: Bowel sounds are normal. There is no distension.  ?   Palpations: Abdomen is soft.  ?   Tenderness: There is no abdominal tenderness. There is no rebound.  ?Musculoskeletal:  ?   Left forearm: Swelling, edema and tenderness present.  ?   Right wrist: Swelling, effusion and tenderness present. Decreased range of motion.  ?   Left wrist: Swelling, effusion and tenderness present. Decreased range of motion.  ?   Right upper leg: Swelling, edema and tenderness present.  ?   Left upper leg: Swelling, edema and tenderness present.  ?   Right knee: Swelling and effusion present.  ?   Left knee: Swelling and effusion present.  ?   Right ankle: Swelling present. Tenderness present. Decreased range of motion.  ?Lymphadenopathy:  ?   Cervical: No cervical  adenopathy.  ?Skin: ?   General: Skin is warm and dry.  ?   Coloration: Skin is not pale.  ?   Findings: Erythema present. No rash.  ?Neurological:  ?   General: No focal deficit present.  ?   Mental Status: She is alert and oriented to person, place, and time.  ?   Motor: No abnormal muscle tone.  ?   Coordination: Coordination normal.  ?Psychiatric:     ?   Behavior: Behavior normal.     ?   Thought Content: Thought content normal.     ?   Judgment: Judgment normal.  ?  ? ?CBC: ? ? ? ?BMET ?Recent Labs  ?  09/24/21 ?1036 09/25/21 ?[id number] 09/26/21 ?0446  ?NA 139 140  --   ?K 3.5 3.9  --   ?CL 103 109  --   ?CO2 26 22  --   ?GLUCOSE 110* 117*  --   ?BUN 32* 23*  --   ?CREATININE 0.99 0.87 0.66  ?CALCIUM 9.8 8.5*  --   ? ? ? ?Liver Panel ? ?Recent Labs  ?  09/24/21 ?1036  ?PROT 6.1*  ?ALBUMIN 2.4*  ?AST 51*  ?ALT 50*  ?ALKPHOS 119  ?BILITOT 1.1  ? ? ? ? ? ?Sedimentation Rate ?Recent Labs  ?  09/26/21 ?0446  ?ESRSEDRATE 59*  ? ?C-Reactive Protein ?Recent Labs  ?  09/24/21 ?1622 09/26/21 ?0446  ?CRP 26.8* 23.9*  ? ? ?Micro Results: ?Recent Results (from the past 720 hour(s))  ?Blood culture (single)     Status: Abnormal  ? Collection Time: 09/23/21  1:53 PM  ? Specimen: BLOOD  ?Result Value Ref Range Status  ? Specimen Description   Final  ?  BLOOD RIGHT ANTECUBITAL ?Performed at [facility], [address] [place], [place] [address] ?  ? Special Requests   Final  ?  BOTTLES DRAWN AEROBIC AND ANAEROBIC Blood Culture adequate volume ?Performed at [facility], [address], [place], [place] [address] ?  ? Culture  Setup Time   Final  ?  IN BOTH AEROBIC AND ANAEROBIC BOTTLES ?GRAM POSITIVE COCCI ?CRITICAL RESULT CALLED TO, READ BACK BY AND VERIFIED WITH: ?Paulina Fusi [place] AT [id number] 09/24/2021 GAA ?Performed at [facility], [place] [address], [place], [place] [address] ?  ? Culture METHICILLIN RESISTANT STAPHYLOCOCCUS AUREUS (A)  Final  ? Report Status 09/26/2021 FINAL  Final  ? Organism ID,  Bacteria METHICILLIN RESISTANT STAPHYLOCOCCUS AUREUS  Final  ?    Susceptibility  ? Methicillin resistant staphylococcus aureus - MIC*  ?  CIPROFLOXACIN <=0.5 SENSITIVE Sensitive   ?  ERYTHROMYCIN <=0.25 SENSITIVE Sensitive   ?  GENTAMICIN <=0.5 SENSITIVE Sensitive   ?  OXACILLIN >=4 RESISTANT Resistant   ?  TETRACYCLINE <=1 SENSITIVE Sensitive   ?  VANCOMYCIN 1 SENSITIVE Sensitive   ?  TRIMETH/SULFA <=10 SENSITIVE Sensitive   ?  CLINDAMYCIN <=0.25 SENSITIVE Sensitive   ?  RIFAMPIN <=0.5 SENSITIVE Sensitive   ?  Inducible Clindamycin NEGATIVE Sensitive   ?  * METHICILLIN RESISTANT STAPHYLOCOCCUS AUREUS  ?Blood Culture ID Panel (Reflexed)     Status: Abnormal  ? Collection Time: 09/23/21  1:53 PM  ?Result Value Ref Range Status  ? Enterococcus faecalis NOT DETECTED NOT DETECTED Final  ? Enterococcus Faecium NOT DETECTED NOT DETECTED Final  ? Listeria monocytogenes NOT DETECTED NOT DETECTED Final  ? Staphylococcus species DETECTED (A) NOT DETECTED Final  ?  Comment: CRITICAL RESULT CALLED TO, READ BACK BY AND VERIFIED WITH: ?Paulina Fusi PHARMD AT 0522 09/24/2021 GAA ?  ? Staphylococcus aureus (BCID) DETECTED (A) NOT DETECTED Final  ?  Comment: Methicillin (oxacillin)-resistant Staphylococcus aureus (MRSA). MRSA is predictably resistant to beta-lactam antibiotics (except ceftaroline). Preferred therapy is vancomycin unless clinically contraindicated. Patient requires contact precautions if  ?hospitalized. ?CRITICAL RESULT CALLED TO, READ BACK BY AND VERIFIED WITH: ?Paulina Fusi PHARMD AT [id number] 09/24/2021 GAA ?  ? Staphylococcus epidermidis NOT DETECTED NOT DETECTED Final  ? Staphylococcus lugdunensis NOT DETECTED NOT DETECTED Final  ? Streptococcus species NOT DETECTED NOT DETECTED Final  ? Streptococcus agalactiae NOT DETECTED NOT DETECTED Final  ? Streptococcus pneumoniae NOT DETECTED NOT DETECTED Final  ? Streptococcus pyogenes NOT DETECTED NOT DETECTED Final  ? A.calcoaceticus-baumannii NOT DETECTED NOT DETECTED Final   ? Bacteroides fragilis NOT DETECTED NOT DETECTED Final  ? Enterobacterales NOT DETECTED NOT DETECTED Final  ? Enterobacter cloacae complex NOT DETECTED NOT DETECTED Final  ? Escherichia coli NOT DETECTED NOT DETECT

## 2021-09-26 NOTE — Progress Notes (Signed)
Pt transferred to [facility] via [place] with personal belongings. Report called to [facility] on 5N.  ?

## 2021-09-26 NOTE — Discharge Summary (Signed)
?Physician Discharge Summary ?  ?Patient: Jill Cox MRN: [id number] DOB: 1976/05/13  ?Admit date:     09/24/2021  ?Discharge date: 09/26/21  ?Discharge Physician: Lorella Nimrod  ? ?PCP: Pcp, No  ? ?Recommendations at discharge:  ?Patient is being transferred to [facility] for multidisciplinary care. ? ?Discharge Diagnoses: ?Principal Problem: ?  MRSA bacteremia ?Active Problems: ?  Severe sepsis ([place]) ?  Hypercalcemia ?  Polyarthritis ?  Depression ?  Engages in [place] ? ? ?Hospital Course: ?Taken from H&P. ? ?Jill Cox is a 46 y.o. female with medical history significant of depression, vaping, who presents with multiple joint pain and a positive blood culture for MRSA. ?  ?Pt states that she had muscle pain below her right shoulder blade. She was seen in ED on 3/5 and had local trigger point injection of 5 cc of 0.25% bupivicaine. She states that her muscle pain has resolved.  She developed multiple joint pain 4 days ago, including bilateral wrist, bilateral knee, right ankle.  She also has pain in bilateral upper thigh.  The pain is constant, moderate, aching, nonradiating.  Patient does not have fever or chills.  Patient was started on prednisone 3 days ago by PCP, without significant improvement. She was seen in ED again on yesterday 3/11, and started on Keflex, still without significant improvement. Today patient was called back due to positive blood culture for MRSA. ?  ?Patient does not have chest pain, cough, shortness breath.  Patient has nausea, no vomiting, diarrhea or abdominal pain.  No symptoms of UTI. ? ?Patient denies any obvious injuries to skin, no history of any hardware. ? ?Patient met sepsis criteria with leukocytosis, tachycardia, mild lactic acidosis which has been resolved now.  Procalcitonin at 0.85 and repeat blood cultures are also positive for MRSA. ?Markedly elevated CRP at 26.8 and ESR at 63.  Significant right lower extremity edema and erythema, right lower extremity venous Doppler  was negative. ?Lyme serologies was also sent from ED during prior recent visit-results pending. ?UDS positive for tricyclic's.  Patient denies any drug use, vape regularly. ?He was placed on vancomycin. ?ID was consulted-will appreciate their help. ?TTE was negative for any vegetations-TEE ordered and will be done tomorrow. ?TEE done today was within normal limit, no vegetations noted. ? ?Patient has disseminated MRSA infection, ID is on board.  Multiple joints involvement with concern of septic joints and pyomyositis.  Multiple MRIs were done by ID and they show multiple scattered abscesses and concern of septic joints.  Orthopedic surgery was also consulted.  Patient needs a very extensive washout involving multiple joints, thighs and forearms. ?Patient will need a tag team of orthopedic surgeon for this extensive washout which cannot be provided at [facility].  After discussion with ID and orthopedic surgery it was thought to be best in patient's interest to transfer her to [facility] for further management. ? ?ID is on board.  Please consult orthopedic surgery as she will need multiple subspecialties to perform this extensive washout. ? ?MRI thoracic spine with epidural abscess extending all the way to the cervical spine, no clear margin on current film, MRI cervical spine ordered.  Patient will also need spinal surgery involvement. ? ? ?Assessment and Plan: ?* MRSA bacteremia ?Patient has quite unusual presentation of MRSA bacteremia.  No hardware or recent injuries.  Only foreign substance was a recent shoulder injection for pain. 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 ?Significant increase in inflammatory markers like CRP and ESR. ?TTE is  negative for any vegetations. ?TEE scheduled for tomorrow. ?ID was consulted-will appreciate their help. ?-Continue with vancomycin ? ?Severe sepsis ([place]) ?Patient met sepsis criteria with leukocytosis, tachycardia, elevated lactic acid which  has been improved now.  Blood cultures positive for MRSA with multiple joint involvement. ?Patient did received IV fluid and antibiotics in ED. ?-Continue with vancomycin ? ?Hypercalcemia ?Resolved with IVF. ? 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 ?Her right lower extremity and left upper extremity was the most affected. ?Right lower extremity venous Doppler was negative. ?Lyme serologies was done from ED-pending results ?No history of tick bite or exposure except she was deep cleaning her house in [place] approximately 8 to 9 days ago.  Has not noticed any rash. ?-We will get right knee x-ray to see if there is any effusion which can be tapped. ? ?Depression ?-Continue Zoloft ? ?Engages in vaping ?- Nicotine patch ? ? ?Consultants: ID, orthopedic surgery, cardiology ?Procedures performed: TEE ?Disposition:  [facility] ?Diet recommendation:  ?Regular diet ? ?DISCHARGE MEDICATION: ?Allergies as of 09/26/2021   ?No Known Allergies ?  ? ?  ?Medication List  ?  ? ?STOP taking these medications   ? ?cephALEXin 500 MG capsule ?Commonly known as: KEFLEX ?  ?predniSONE 20 MG tablet ?Commonly known as: DELTASONE ?  ? ?  ? ?TAKE these medications   ? ?acetaminophen 325 MG tablet ?Commonly known as: TYLENOL ?Take 2 tablets (650 mg total) by mouth every 6 (six) hours as needed for mild pain or fever. ?  ?cyclobenzaprine 10 MG tablet ?Commonly known as: FLEXERIL ?Take 1 tablet (10 mg total) by mouth 3 (three) times daily as needed for muscle spasms. ?  ?enoxaparin 40 MG/0.4ML injection ?Commonly known as: LOVENOX ?Inject 0.4 mLs (40 mg total) into the skin daily. ?  ?HYDROmorphone 1 MG/ML injection ?Commonly known as: DILAUDID ?Inject 0.5 mLs (0.5 mg total) into the vein every 4 (four) hours as needed for moderate pain or severe pain. ?  ?naproxen 500 MG tablet ?Commonly known as: Naprosyn ?Take 1 tablet (500 mg total) by mouth 2 (two) times daily with a meal  for 10 days. ?What changed: Another medication with the same name was removed. Continue taking this medication, and follow the directions you see here. ?  ?nicotine 21 mg/24hr patch ?Commonly known as: NICODERM CQ - dosed in mg/24 hours ?Place 1 patch (21 mg total) onto the skin daily. ?Start taking on: September 27, 2021 ?  ?ondansetron 4 MG/2ML Soln injection ?Commonly known as: ZOFRAN ?Inject 2 mLs (4 mg total) into the vein every 8 (eight) hours as needed for nausea or vomiting. ?  ?oxyCODONE-acetaminophen 5-325 MG tablet ?Commonly known as: PERCOCET/ROXICET ?Take 1 tablet by mouth every 4 (four) hours as needed for moderate pain. ?What changed: reasons to take this ?  ?prochlorperazine 10 MG/2ML injection ?Commonly known as: COMPAZINE ?Inject 2 mLs (10 mg total) into the vein every 6 (six) hours as needed. ?  ?sertraline 25 MG tablet ?Commonly known as: ZOLOFT ?Take 25 mg by mouth every morning. ?  ?vancomycin HCl 1000 MG/200ML Soln ?Commonly known as: VANCOREADY ?Inject 200 mLs (1,000 mg total) into the vein every 12 (twelve) hours. ?  ? ?  ? ? ?Discharge Exam: ?Filed Weights  ? 09/24/21 1009 09/26/21 1218  ?Weight: 81.6 kg 81.6 kg  ? ?General.  Well-developed lady, in no acute distress. ?Musculoskeletal.  Extensive erythema and edema involving multiple joints, more prominent in left upper extremity,  right wrist, bilateral ankle and right lower extremity, some tenderness along thoracic spine. ?Pulmonary.  Lungs clear bilaterally, normal respiratory effort. ?CV.  Regular rate and rhythm, no JVD, rub or murmur. ?Abdomen.  Soft, nontender, nondistended, BS positive. ?CNS.  Alert and oriented .  No focal neurologic deficit. ?Extremities.  No edema, no cyanosis, pulses intact and symmetrical. ?Psychiatry.  Judgment and insight appears normal.  ? ?Condition at discharge: stable ? ?The results of significant diagnostics from this hospitalization (including imaging, microbiology, ancillary and laboratory) are listed below  for reference.  ? ?Imaging Studies: ?DG Chest 2 View ? ?Result Date: 09/17/2021 ?CLINICAL DATA:  Right-sided chest pain, no injury EXAM: CHEST - 2 VIEW COMPARISON:  None. FINDINGS: The heart size and media

## 2021-09-26 NOTE — Progress Notes (Signed)
*  PRELIMINARY RESULTS* ?Echocardiogram ?Echocardiogram Transesophageal has been performed. ? ?Laconda Basich, Sonia Side ?09/26/2021, 1:13 PM ?

## 2021-09-26 NOTE — Progress Notes (Addendum)
Transesophageal Echocardiogram : ? ?Indication: MRSA bacteremia ?Requesting/ordering  physician:  ? ?Procedure: Benzocaine spray x2 and 2 mls x 2 of viscous lidocaine were given orally to provide local anesthesia to the oropharynx. The patient was positioned supine on the left side, bite block provided. The patient was moderately sedated with the doses of versed and fentanyl as detailed below.  Using digital technique an omniplane probe was advanced into the distal esophagus without incident.  ? ?Moderate sedation: ?1. Sedation used:  Versed: 2 mg, Fentanyl: 50 mcg IV ?2. Time administered:   :12:45 pm  Time when patient started recovery: 1:10 pm ?Total sedation time 25 minutes ?3. I was face to face during this time ? ?See report in EPIC  for complete details: ?In brief, ?No valve vegetation noted ? transgastric imaging revealed normal LV function with no RWMAs and no mural apical thrombus.  .  Estimated ejection fraction was greater than 55% %.  Right sided cardiac chambers were normal with no evidence of pulmonary hypertension. ? ?Imaging of the septum showed no ASD or VSD ?Bubble study was negative for shunt ?2D and color flow confirmed no PFO ? ?Mild dual jet MR ? ?The LA was well visualized in orthogonal views.  There was no spontaneous contrast and no thrombus in the LA and LA appendage  ? ?The descending thoracic aorta had no  mural aortic debris with no evidence of aneurysmal dilation or disection.  Minimal aortic plaque in the arch ? ? ?Ida Rogue ?09/26/2021 ?1:12 PM ? ?

## 2021-09-26 NOTE — Plan of Care (Signed)

## 2021-09-26 NOTE — TOC Progression Note (Signed)
Transition of Care (TOC) - Progression Note  ? ? ?Patient Details  ?Name: Jill Cox ?MRN: [id number] ?Date of Birth: 29-Oct-1975 ? ?Transition of Care (TOC) CM/SW Contact  ?Pete Pelt, RN ?Phone Number: ?09/26/2021, 2:43 PM ? ?Clinical Narrative:   As per care team, it is anticipated that patient will be transferred to [facility] for continued medical workup. ? ? ? ?  ?  ? ?Expected Discharge Plan and Services ?  ?  ?  ?  ?  ?                ?  ?  ?  ?  ?  ?  ?  ?  ?  ?  ? ? ?Social Determinants of Health (SDOH) Interventions ?  ? ?Readmission Risk Interventions ?No flowsheet data found. ? ?

## 2021-09-26 NOTE — Plan of Care (Signed)
?  Problem: Clinical Measurements: Goal: Ability to maintain clinical measurements within normal limits will improve Outcome: Progressing Goal: Will remain free from infection Outcome: Progressing Goal: Diagnostic test results will improve Outcome: Progressing Goal: Respiratory complications will improve Outcome: Progressing Goal: Cardiovascular complication will be avoided Outcome: Progressing   Problem: Nutrition: Goal: Adequate nutrition will be maintained Outcome: Progressing   Problem: Elimination: Goal: Will not experience complications related to bowel motility Outcome: Progressing Goal: Will not experience complications related to urinary retention Outcome: Progressing   Problem: Pain Managment: Goal: General experience of comfort will improve Outcome: Progressing   Problem: Safety: Goal: Ability to remain free from injury will improve Outcome: Progressing   Problem: Skin Integrity: Goal: Risk for impaired skin integrity will decrease Outcome: Progressing   

## 2021-09-26 NOTE — Progress Notes (Signed)
PHARMACY - PHYSICIAN COMMUNICATION ?CRITICAL VALUE ALERT - BLOOD CULTURE IDENTIFICATION (BCID) ? ?[name] is an 46 y.o. female who presented to [facility] on 09/24/2021 with a chief complaint of sepsis.  ? ?Assessment:  GPC growing in 1 of 4 bottles.  MRSA ID'd on previous BCID.  (include suspected source if known) ? ?Name of physician (or Provider) Contacted: Dr Antionette Char ? ?Current antibiotics:  Vancomycin ? ?Changes to prescribed antibiotics recommended:  ?Patient is on recommended antibiotics - No changes needed ? ?Results for orders placed or performed during the hospital encounter of 09/23/21  ?Blood Culture ID Panel (Reflexed) (Collected: 09/23/2021  1:53 PM)  ?Result Value Ref Range  ? Enterococcus faecalis NOT DETECTED NOT DETECTED  ? Enterococcus Faecium NOT DETECTED NOT DETECTED  ? Listeria monocytogenes NOT DETECTED NOT DETECTED  ? Staphylococcus species DETECTED (A) NOT DETECTED  ? Staphylococcus aureus (BCID) DETECTED (A) NOT DETECTED  ? Staphylococcus epidermidis NOT DETECTED NOT DETECTED  ? Staphylococcus lugdunensis NOT DETECTED NOT DETECTED  ? Streptococcus species NOT DETECTED NOT DETECTED  ? Streptococcus agalactiae NOT DETECTED NOT DETECTED  ? Streptococcus pneumoniae NOT DETECTED NOT DETECTED  ? Streptococcus pyogenes NOT DETECTED NOT DETECTED  ? A.calcoaceticus-baumannii NOT DETECTED NOT DETECTED  ? Bacteroides fragilis NOT DETECTED NOT DETECTED  ? Enterobacterales NOT DETECTED NOT DETECTED  ? Enterobacter cloacae complex NOT DETECTED NOT DETECTED  ? Escherichia coli NOT DETECTED NOT DETECTED  ? Klebsiella aerogenes NOT DETECTED NOT DETECTED  ? Klebsiella oxytoca NOT DETECTED NOT DETECTED  ? Klebsiella pneumoniae NOT DETECTED NOT DETECTED  ? Proteus species NOT DETECTED NOT DETECTED  ? Salmonella species NOT DETECTED NOT DETECTED  ? Serratia marcescens NOT DETECTED NOT DETECTED  ? Haemophilus influenzae NOT DETECTED NOT DETECTED  ? Neisseria meningitidis NOT DETECTED NOT DETECTED  ? Pseudomonas  aeruginosa NOT DETECTED NOT DETECTED  ? Stenotrophomonas maltophilia NOT DETECTED NOT DETECTED  ? Candida albicans NOT DETECTED NOT DETECTED  ? Candida auris NOT DETECTED NOT DETECTED  ? Candida glabrata NOT DETECTED NOT DETECTED  ? Candida krusei NOT DETECTED NOT DETECTED  ? Candida parapsilosis NOT DETECTED NOT DETECTED  ? Candida tropicalis NOT DETECTED NOT DETECTED  ? Cryptococcus neoformans/gattii NOT DETECTED NOT DETECTED  ? Meth resistant mecA/C and MREJ DETECTED (A) NOT DETECTED  ? ? ?Lucillia Corson D ?09/26/2021  3:27 AM ? ?

## 2021-09-27 ENCOUNTER — Inpatient Hospital Stay (HOSPITAL_COMMUNITY): Payer: 59

## 2021-09-27 ENCOUNTER — Encounter: Payer: Self-pay | Admitting: Cardiovascular Disease

## 2021-09-27 DIAGNOSIS — M659 Synovitis and tenosynovitis, unspecified: Secondary | ICD-10-CM | POA: Diagnosis not present

## 2021-09-27 DIAGNOSIS — B9562 Methicillin resistant Staphylococcus aureus infection as the cause of diseases classified elsewhere: Secondary | ICD-10-CM | POA: Diagnosis not present

## 2021-09-27 DIAGNOSIS — R7881 Bacteremia: Secondary | ICD-10-CM

## 2021-09-27 DIAGNOSIS — F32A Depression, unspecified: Secondary | ICD-10-CM

## 2021-09-27 DIAGNOSIS — G062 Extradural and subdural abscess, unspecified: Secondary | ICD-10-CM | POA: Diagnosis not present

## 2021-09-27 DIAGNOSIS — M009 Pyogenic arthritis, unspecified: Secondary | ICD-10-CM

## 2021-09-27 LAB — BLOOD CULTURE ID PANEL (REFLEXED) - BCID2

## 2021-09-27 LAB — CBC WITH DIFFERENTIAL/PLATELET
Abs Immature Granulocytes: 0 10*3/uL (ref 0.00–0.07)
Basophils Absolute: 0 10*3/uL (ref 0.0–0.1)
Basophils Relative: 0 %
Eosinophils Absolute: 0 10*3/uL (ref 0.0–0.5)
Eosinophils Relative: 0 %
HCT: 31.2 % — ABNORMAL LOW (ref 36.0–46.0)
Hemoglobin: 10.3 g/dL — ABNORMAL LOW (ref 12.0–15.0)
Lymphocytes Relative: 5 %
Lymphs Abs: 0.7 10*3/uL (ref 0.7–4.0)
MCH: 30.7 pg (ref 26.0–34.0)
MCHC: 33 g/dL (ref 30.0–36.0)
MCV: 92.9 fL (ref 80.0–100.0)
Monocytes Absolute: 0.4 10*3/uL (ref 0.1–1.0)
Monocytes Relative: 3 %
Neutro Abs: 13.1 10*3/uL — ABNORMAL HIGH (ref 1.7–7.7)
Neutrophils Relative %: 92 %
Platelets: 205 10*3/uL (ref 150–400)
RBC: 3.36 MIL/uL — ABNORMAL LOW (ref 3.87–5.11)
RDW: 14.2 % (ref 11.5–15.5)
WBC: 14.2 10*3/uL — ABNORMAL HIGH (ref 4.0–10.5)
nRBC: 0 % (ref 0.0–0.2)
nRBC: 0 /100 WBC

## 2021-09-27 LAB — URINE CULTURE: Culture: 50000 — AB

## 2021-09-27 LAB — COMPREHENSIVE METABOLIC PANEL
ALT: 32 U/L (ref 0–44)
AST: 28 U/L (ref 15–41)
Albumin: <1.5 g/dL — ABNORMAL LOW (ref 3.5–5.0)
Alkaline Phosphatase: 84 U/L (ref 38–126)
Anion gap: 10 (ref 5–15)
BUN: 12 mg/dL (ref 6–20)
CO2: 24 mmol/L (ref 22–32)
Calcium: 7.3 mg/dL — ABNORMAL LOW (ref 8.9–10.3)
Chloride: 102 mmol/L (ref 98–111)
Creatinine, Ser: 0.67 mg/dL (ref 0.44–1.00)
GFR, Estimated: >60 mL/min (ref >60)
Glucose, Bld: 107 mg/dL — ABNORMAL HIGH (ref 70–99)
Potassium: 3.8 mmol/L (ref 3.5–5.1)
Sodium: 136 mmol/L (ref 135–145)
Total Bilirubin: 1 mg/dL (ref 0.3–1.2)
Total Protein: 4.6 g/dL — ABNORMAL LOW (ref 6.5–8.1)

## 2021-09-27 LAB — MAGNESIUM: Magnesium: 2 mg/dL (ref 1.7–2.4)

## 2021-09-27 LAB — HEPATITIS B SURFACE ANTIBODY, QUANTITATIVE: Hep B S AB Quant (Post): <3.1 m[IU]/mL — ABNORMAL LOW (ref >9.9)

## 2021-09-27 LAB — HCV AB W REFLEX TO QUANT PCR: HCV Ab: NONREACTIVE

## 2021-09-27 LAB — CULTURE, BLOOD (ROUTINE X 2)

## 2021-09-27 LAB — HCV INTERPRETATION

## 2021-09-27 MED ORDER — VANCOMYCIN HCL IN DEXTROSE 1-5 GM/200ML-% IV SOLN
1000.0000 mg | Freq: Two times a day (BID) | INTRAVENOUS | Status: DC
  Administered 2021-09-27 (×3): 1000 mg via INTRAVENOUS
  Filled 2021-09-27 (×4): qty 200

## 2021-09-27 MED ORDER — SERTRALINE HCL 25 MG PO TABS
25.0000 mg | ORAL_TABLET | Freq: Every morning | ORAL | Status: DC
  Administered 2021-09-27 – 2021-10-06 (×10): 25 mg via ORAL
  Filled 2021-09-27 (×10): qty 1

## 2021-09-27 MED ORDER — SODIUM CHLORIDE 0.9 % IV SOLN: INTRAVENOUS | Status: DC

## 2021-09-27 MED ORDER — HYDROMORPHONE HCL 1 MG/ML IJ SOLN
0.5000 mg | INTRAMUSCULAR | Status: DC | PRN
  Administered 2021-09-27 – 2021-10-05 (×30): 0.5 mg via INTRAVENOUS
  Filled 2021-09-27 (×31): qty 0.5

## 2021-09-27 MED ORDER — NALOXONE HCL 0.4 MG/ML IJ SOLN
0.4000 mg | INTRAMUSCULAR | Status: DC | PRN
Start: 2021-09-27 — End: 2021-10-06

## 2021-09-27 MED ORDER — CYCLOBENZAPRINE HCL 10 MG PO TABS
10.0000 mg | ORAL_TABLET | Freq: Three times a day (TID) | ORAL | Status: DC | PRN
  Administered 2021-09-27 – 2021-10-05 (×9): 10 mg via ORAL
  Filled 2021-09-27 (×10): qty 1

## 2021-09-27 MED ORDER — GADOBUTROL 1 MMOL/ML IV SOLN
8.0000 mL | Freq: Once | INTRAVENOUS | Status: AC | PRN
  Administered 2021-09-27: 8 mL via INTRAVENOUS

## 2021-09-27 MED ORDER — ACETAMINOPHEN 650 MG RE SUPP: 650.0000 mg | Freq: Four times a day (QID) | RECTAL | Status: DC | PRN

## 2021-09-27 MED ORDER — FUROSEMIDE 10 MG/ML IJ SOLN
40.0000 mg | Freq: Once | INTRAMUSCULAR | Status: AC
  Administered 2021-09-27: 40 mg via INTRAVENOUS
  Filled 2021-09-27: qty 4

## 2021-09-27 MED ORDER — ALBUMIN HUMAN 25 % IV SOLN
25.0000 g | Freq: Four times a day (QID) | INTRAVENOUS | Status: AC
  Administered 2021-09-27 – 2021-09-28 (×5): 25 g via INTRAVENOUS
  Administered 2021-09-28: 12.5 g via INTRAVENOUS
  Administered 2021-09-28 – 2021-09-29 (×2): 25 g via INTRAVENOUS
  Filled 2021-09-27 (×8): qty 100

## 2021-09-27 MED ORDER — ONDANSETRON HCL 4 MG/2ML IJ SOLN
4.0000 mg | Freq: Four times a day (QID) | INTRAMUSCULAR | Status: DC | PRN
  Administered 2021-09-27 – 2021-09-28 (×2): 4 mg via INTRAVENOUS
  Filled 2021-09-27 (×2): qty 2

## 2021-09-27 MED ORDER — ACETAMINOPHEN 325 MG PO TABS
650.0000 mg | ORAL_TABLET | Freq: Four times a day (QID) | ORAL | Status: DC | PRN
  Administered 2021-09-27 – 2021-09-30 (×7): 650 mg via ORAL
  Filled 2021-09-27 (×7): qty 2

## 2021-09-27 NOTE — Hospital Course (Addendum)
Jill Cox is a 46 y.o. female with medical history significant for depression who is admitted to [facility] on 09/26/2021 by way of transfer from [facility] with MRSA bacteremia in the setting of epidural abscess of the thoracic spine, multiple suspected septic joints, after presenting from home to [facility] ED on 09/24/2021 complaining of polyarthralgias.  ?Patient began experiencing polyarthralgias with distribution that included bilateral wrist, bilateral knees, and right ankle.  Her PCP initiated 3-day course of prednisone, without any ensuing improvement in the symptoms.  Given the ongoing nature of these polyarthralgias, the patient then presented to [facility] ED on 09/23/2021 at which time she was started on Keflex and d/ced, presumably for associated cellulitis.  Blood cultures drawn at the time of the ED visit ultimately grew MRSA, and the patient was contacted the subsequent day and instructed to return back to [facility] ED for further evaluation and management, leading to her formal admission on 09/24/2021 for MRSA bacteremia. She was started on IV vancomycin, with repeat blood cultures also reportedly positive for MRSA. She underwent MRI of the bilateral knees demonstrating moderate to large effusions concerning for septic arthritis.  She also underwent right ankle MRI, which demonstrated evidence of effusion, potentially septic in nature.  Additionally, MRIs of the bilateral wrists showed evidence of tenosynovitis without evidence of overt septic arthritis.  Subsequently, MRI of the thoracic spine showed evidence of epidural abscess at T2/T3.  Cardiology was consulted, and ultimately the patient underwent TTE as well as TEE which showed no evidence of bacterial endocarditis. Subsequently, the patient was transferred to [facility] for further evaluation and management of her MRSA bacteremia, epidural abscess of the thoracic spine, and multiple suspected septic joints requiring extensive washout procedure by orthopedic  surgery, as above. ?ID, orthopedic surgery, neurosurgery following. ?  ?

## 2021-09-27 NOTE — Assessment & Plan Note (Addendum)
-   Multiple potential sources for MRSA bacteremia, including epidural abscess associated with T2/T3, with multiple potential septic joints including the bilateral knees. ?-TEE demonstrated no evidence of bacterial endocarditis ?-ID following and due to ongoing positive blood cultures, ongoing fevers IV antibiotics have been changed from IV vancomycin to IV daptomycin and IV teflaro.  Plan to continue on Dapto/ceftaroline until 3/30 then suspected daptomycin.S/P PICC line placement  ? ?

## 2021-09-27 NOTE — Consult Note (Signed)
Pharmacy Antibiotic Note ? ?Jill Cox is a 46 y.o. female transferred from [facility] on 09/26/2021 with MRSA bacteremia.  ? ?Plan: ?Continue Vancomycin 1000 mg IV q12h ? ?Height: 5\' 6"  (167.6 cm) ?Weight: 81.7 kg (180 lb 1.9 oz) ?IBW/kg (Calculated) : 59.3 ? ?Temp (24hrs), Avg:98.7 ?F (37.1 ?C), Min:98 ?F (36.7 ?C), Max:100.1 ?F (37.8 ?C) ? ?Recent Labs  ?Lab 09/23/21 ?1207 09/24/21 ?1036 09/24/21 ?1243 09/24/21 ?1703 09/24/21 ?2024 09/25/21 ?[id number] 09/26/21 ?0446  ?WBC 16.0* 17.3*  --   --   --  11.3*  --   ?CREATININE 1.27* 0.99  --   --   --  0.87 0.66  ?LATICACIDVEN  --  2.2* 2.5* 1.7 1.9  --   --   ? ?  ?Estimated Creatinine Clearance: 93.7 mL/min (by C-G formula based on SCr of 0.66 mg/dL).   ? ?No Known Allergies ? ?Antimicrobials this admission: ?3/12 Zosyn >> x 1  ?3/12 Vancomycin >>  ? ?Dose adjustments this admission: ? ? ?Microbiology results: ?3/11 BCx: 2/2 MRSA ?3/12 Bcx: pending ? ? ?Phillis Knack, PharmD, BCPS  ?09/27/2021 1:15 AM ? ?

## 2021-09-27 NOTE — Progress Notes (Signed)
?PROGRESS NOTE ? ? ? Tache Cutrone  [id number] DOB: 11/28/1975 DOA: 09/26/2021 ?PCP: Bubba Camp, [place]  ? ? ?No chief complaint on file. ? ? ?Brief Narrative:  ?HPI per Dr. Velia Meyer ?Kamarea Feland is a 46 y.o. female with medical history significant for depression who is admitted to [facility] on 09/26/2021 by way of transfer from [facility] with MRSA bacteremia in the setting of epidural abscess of the thoracic spine, multiple suspected septic joints, after presenting from home to [facility] ED on 09/24/2021 complaining of polyarthralgias.  ?  ?Please see hospital course documented in discharge summary.  However, in general, it appears that around 09/19/2021, the patient began experiencing polyarthralgias with distribution that included bilateral wrist, bilateral knees, and right ankle.  She reported in this capacity to her PCP, who initiated a 3-day course of prednisone, without any ensuing improvement in the symptoms.  Given the ongoing nature of these polyarthralgias, the patient then presented to [facility] ED on 09/23/2021 at which time she was started on Keflex, presumably for associated cellulitis.  Blood cultures drawn at the time of the ED visit ultimately grew MRSA, and the patient was contacted the subsequent day and instructed to return back to [facility] ED for further evaluation and management, leading to her formal admission on 09/24/2021 for MRSA bacteremia.  ?  ?She was started on IV vancomycin, with repeat blood cultures also reportedly positive for MRSA.  Ensuing evaluation included several MRIs.  Within this radiographic evaluation, she underwent MRI of the bilateral knees demonstrating moderate to large effusions concerning for septic arthritis.  She also underwent right ankle MRI, which demonstrated evidence of effusion, potentially septic in nature.  Additionally, MRIs of the bilateral wrists showed evidence of tenosynovitis without evidence of overt septic arthritis.  Subsequently, MRI of the thoracic spine  showed evidence of epidural abscess at T2/T3.  Cardiology was consulted, and ultimately the patient underwent TTE as well as TEE which showed no evidence of bacterial endocarditis.  Of note, patient has no history of joint replacement, with all the above involved joints noted to be negative in nature.  Denies any history of recreational drug use, including no history of IV drug abuse. ?  ?Infectious disease was consulted, and IV vancomycin continued, with infectious disease to continue to consult while the patient is at [facility].  Additionally, [facility] orthopedic surgery service was consulted on 09/25/2021.  They reportedly recommend diffuse joint washout involving the above affected joints, but states that there is services unable to perform the extensive joint washout required, with ensuing recommendation to transfer to [facility] where the orthopedic surgical service is more equipped to build to handle the extensive nature of the ensuing recommended poly joint washout procedures.  ?  ?Of note, initial presentation on the day of admission on 09/24/2021 was associated with severe sepsis in the setting of leukocytosis, tachycardia, with mildly elevated lactate.  His vital signs in laboratory abnormalities resolved following initiation of IV fluids and aforementioned IV vancomycin.  Reportedly hemodynamically stable throughout her course at [facility]. ?  ?Subsequent, the patient was transferred to [facility] for further evaluation and management of her MRSA bacteremia, epidural abscess of the thoracic spine, and multiple suspected septic joints requiring extensive washout procedure by orthopedic surgery, as above. ? ?-Orthopedics, neurosurgery, ID consulted.Patient ?-Patient placed empirically on IV vancomycin ? ?   ? ? ?Assessment & Plan: ? Principal Problem: ?  MRSA bacteremia ?Active Problems: ?  Depression ?  Septic joint ([place]) ?  Epidural  abscess ?  Tenosynovitis ? ? ? ?Assessment and Plan: ?* MRSA bacteremia ?#) MRSA  bacteremia: Initial blood cultures positive for MRSA on 09/23/2021, with repeat blood cultures drawn following interval initiation of IV vancomycin also positive during hospital course at [facility].  ?- Multiple potential sources for MRSA bacteremia, including epidural abscess associated with T2/T3, with multiple potential septic joints including the bilateral knees, potentially representing down feel and consequences of patient's MRSA bacteremia.   ?-Of note, TEE demonstrated no evidence of bacterial endocarditis.  Infectious disease consulted, and assessed the patient at [facility] and also following the patient at [facility]. ?-Repeat blood cultures x2 ordered. ?-ID has seen the patient recommending continuation of IV vancomycin, repeat blood cultures x2 tomorrow. ?-Patient seen in consultation by neurosurgery, Dr. Saintclair Halsted who reviewed MRI and notes epidural abscess with no evidence of cord compression however noted visualization stopped the lower thoracic spine and as such cervical MRI ordered per neurosurgery. ?-At this time neurosurgery does not feel or recommend surgical evacuation of epidural abscess at this time and recommending continued IV antibiotics. ?-Patient seen by orthopedics who recommended continued IV antibiotics at this time with no surgical intervention. ?  ?  ? ?Depression ?-Stable.   ?-Continue Zoloft.  ?  ? ?Tenosynovitis ?  ?#) Tenosynovitis of the bilateral wrists: Identified on MRI at [facility] these images reportedly showing no evidence of overt septic arthritis.  Orthopedic surgery consulted during [facility] course. ?-Orthopedics, reassessed patient at [facility] who recommending continued IV antibiotics at this time per ID recommendations, no surgical intervention from orthopedics standpoint at this time however if clinical exam worsens could consider aspiration but however deferring for now. ?-Appreciate orthopedics input and recommendations. ? ?Epidural abscess ?  ?#) Epidural abscess of the  thoracic spine: MRI of the thoracic spine performed during [facility] hospital course suggestive of epidural abscess at T2/T3, without associated acute focal neurologic deficits.  Potentially representing source of the patient's MRSA bacteremia with subsequent seeding of multiple peripheral joints, as above. ?-Continue IV vancomycin. ?-Continue IV pain management. ?-Patient seen in consultation by neurosurgery, Dr. Saintclair Halsted who has reviewed the films and patient noted with a dorsal epidural abscess with no evidence of cord compression however visualization noted to stop at the lower thoracic spine and recommending MRI of the C-spine for further evaluation. ?-Per neurosurgery no surgical intervention at this time and recommending continuation of IV antibiotics. ?-Neurosurgery following and appreciate input and recommendations. ?  ? ?Septic joint ([place]) ?#) Bilateral knee joint effusions:  ?-Moderate to large effusions identified on bilateral knees per MRIs performed at [facility], concerning for septic arthritis in bilateral distribution, in the setting of presenting MRSA bacteremia.   ?-Orthopedic surgery consulted during [facility] course, with reported associated recommendation for extensive joint washout, prompting patient to be transferred to [facility] for updated orthopedic surgery consultation and extensive joint washout. ?-Patient reassessed by orthopedics, Dr. Doreatha Martin at [facility] and at this point in time no surgical intervention from orthopedics is recommended, continued IV antibiotics per ID and if clinical exam changes or worsens may consider aspiration however will defer that for now.  Orthopedics. ?-IV vancomycin. ?-ID following. ?  ?Plan: Anticipate subsequent orthopedic surgery consultation.  Continue IV vancomycin.  Prn IV Dilaudid.  Repeat CBC in the morning. ? ? ? ? ?  ? ? ?DVT prophylaxis: SCDs ?Code Status: Full ?Family Communication: Updated patient, mother and father at bedside. ?Disposition: TBD ? ?Status  is: Inpatient ?Remains inpatient appropriate because: Severity of illness ?  ?  Consultants:  ?ID: Dr. Tommy Medal 09/25/2021 ?Orthopedics: Dr. Doreatha Martin 09/27/2021 ?Neurosurgery: Dr. Saintclair Halsted 09/27/2021 ? ? ?Procedures:

## 2021-09-27 NOTE — Consult Note (Signed)
Reason for Consult:Polyarthralgia ?Referring Physician: Irine Seal ?Time called: 0823 ?Time at bedside: 0858 ? ? ?Jill Cox is an 46 y.o. female.  ?HPI: Jill Cox was in her usual state of health until last Wednesday when she began to experience back and multiple joint pain. This quickly progressed to severe pain. She was seen and given a 3d course of steroids which did not help. She came to the ED at [facility] on 3/11 and was given Keflex. When blood cultures came back positive she was instructed to return and admitted. Extensive imaging showed an epidural abscess and several joint abnormalities and she was transferred to [facility] for further care. She denies any prior hx/o, IVDU, or recent travel. 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 ? ?Past Medical History:  ?Diagnosis Date  ? Depression   ? Engages in [place]   ? ? ?Past Surgical History:  ?Procedure Laterality Date  ? CHOLECYSTECTOMY    ? ? ?Family History  ?Problem Relation Age of Onset  ? Heart disease Mother   ? Heart disease Father   ? ? ?Social History:  reports that she has never smoked. She has never used smokeless tobacco. She reports that she does not currently use alcohol. She reports that she does not use drugs. ? ?Allergies: No Known Allergies ? ?Medications: I have reviewed the patient's current medications. ? ?Results for orders placed or performed during the hospital encounter of 09/26/21 (from the past 48 hour(s))  ?Magnesium     Status: None  ? Collection Time: 09/27/21  5:43 AM  ?Result Value Ref Range  ? Magnesium 2.0 1.7 - 2.4 mg/dL  ?  Comment: Performed at [facility], [place] [address], [place], [place] [address]  ?Comprehensive metabolic panel     Status: Abnormal  ? Collection Time: 09/27/21  5:43 AM  ?Result Value Ref Range  ? Sodium 136 135 - 145 mmol/L  ? Potassium 3.8 3.5 - 5.1 mmol/L  ? Chloride 102 98 - 111 mmol/L  ? CO2 24 22 - 32 mmol/L  ? Glucose, Bld 107 (H) 70 - 99 mg/dL  ?  Comment: Glucose reference range  applies only to samples taken after fasting for at least 8 hours.  ? BUN 12 6 - 20 mg/dL  ? Creatinine, Ser 0.67 0.44 - 1.00 mg/dL  ? Calcium 7.3 (L) 8.9 - 10.3 mg/dL  ? Total Protein 4.6 (L) 6.5 - 8.1 g/dL  ? Albumin <1.5 (L) 3.5 - 5.0 g/dL  ? AST 28 15 - 41 U/L  ? ALT 32 0 - 44 U/L  ? Alkaline Phosphatase 84 38 - 126 U/L  ? Total Bilirubin 1.0 0.3 - 1.2 mg/dL  ? GFR, Estimated >60 >60 mL/min  ?  Comment: (NOTE) ?Calculated using the CKD-EPI Creatinine Equation (2021) ?  ? Anion gap 10 5 - 15  ?  Comment: Performed at [facility], [place] [address], [place], [place] [address]  ?CBC with Differential/Platelet     Status: Abnormal  ? Collection Time: 09/27/21  5:43 AM  ?Result Value Ref Range  ? WBC 14.2 (H) 4.0 - 10.5 K/uL  ? RBC 3.36 (L) 3.87 - 5.11 MIL/uL  ? Hemoglobin 10.3 (L) 12.0 - 15.0 g/dL  ? HCT 31.2 (L) 36.0 - 46.0 %  ? MCV 92.9 80.0 - 100.0 fL  ? MCH 30.7 26.0 - 34.0 pg  ? MCHC 33.0 30.0 - 36.0 g/dL  ? RDW 14.2 11.5 - 15.5 %  ?  Platelets 205 150 - 400 K/uL  ? nRBC 0.0 0.0 - 0.2 %  ? Neutrophils Relative % 92 %  ? Neutro Abs 13.1 (H) 1.7 - 7.7 K/uL  ? Lymphocytes Relative 5 %  ? Lymphs Abs 0.7 0.7 - 4.0 K/uL  ? Monocytes Relative 3 %  ? Monocytes Absolute 0.4 0.1 - 1.0 K/uL  ? Eosinophils Relative 0 %  ? Eosinophils Absolute 0.0 0.0 - 0.5 K/uL  ? Basophils Relative 0 %  ? Basophils Absolute 0.0 0.0 - 0.1 K/uL  ? WBC Morphology See Note   ?  Comment: Mild Left Shift. 1 to 5% Metas and Myelos, Occ Pro Noted.  ? nRBC 0 0 /100 WBC  ? Abs Immature Granulocytes 0.00 0.00 - 0.07 K/uL  ?  Comment: Performed at [facility], [place] [address], [place], [place] [address]  ? ? ?MR THORACIC SPINE WO CONTRAST ? ?Result Date: 09/26/2021 ?CLINICAL DATA:  Epidural abscess EXAM: MRI THORACIC SPINE WITHOUT CONTRAST TECHNIQUE: Multiplanar, multisequence MR imaging of the thoracic spine was performed. No intravenous contrast was administered. COMPARISON:  None. FINDINGS: Alignment:  Physiologic. Vertebrae: No evidence of  acute fracture or aggressive osseous lesion. There is no evidence of discitis. Cord: There is slight anterior displacement of the cord above the level of T2 but no effacement or abnormal cord signal. Paraspinal and other soft tissues: Small bilateral pleural effusions. There is a probable sebaceous cyst along the right lower back measuring 1.3 x 1.4 cm (axial medic image 35). Disc levels: There is a right facet effusion at T2-T3 with adjacent edema and fluid collecting in the interspinous space (sagittal stir images 4-9). There is adjacent paraspinal edema bilaterally. There is a posterior epidural fluid collection, extending from the upper thoracic spine into the cervical spine beyond the field of view, inferior margin is at the level of T3-T4. This measures at maximum 5 mm in thickness the level of T2 (axial T2 image 9). This results in mild canal narrowing without cord effacement. There is mild bony edema within the right T2 facet and T2 spinous process. IMPRESSION: Septic right facet joint at T2-T3 with associated posterior epidural phlegmon/possible abscess measuring up to 5 mm in thickness, extending from upper thoracic spine into the cervical spine beyond the field of view. The inferior margin of this collection is at the level of T3-T4. Adjacent paraspinal edema and an interspinous fluid collecting at T2-T3 which may be infectious. Recommend cervical spine MRI with and without contrast for further evaluation, with inclusion of the T3-T4 level within the field of view if possible. Small bilateral pleural effusions. These results were called by telephone at the time of interpretation on 09/26/2021 at 4:39 pm to provider CORNELIUS VAN DAM , who verbally acknowledged these results. Electronically Signed   By: Maurine Simmering M.D.   On: 09/26/2021 16:45  ? ?MR WRIST RIGHT WO CONTRAST ? ?Result Date: 09/26/2021 ?CLINICAL DATA:  Septic arthritis suspected, wrist, no prior imaging EXAM: MR OF THE RIGHT WRIST WITHOUT  CONTRAST TECHNIQUE: Multiplanar, multisequence MR imaging of the right wrist was performed. No intravenous contrast was administered. COMPARISON:  None. FINDINGS: Ligaments: Scapholunate and lunotriquetral ligaments are intact. Triangular fibrocartilage: Central perforation of the TFCC articular disc. Tendons: Mild focal tenosynovitis of extensor compartments 2 and 4 at the level of the midcarpal joints. There is tenosynovitis of the flexor carpi radialis tendon just proximal to the wrist. The flexor tendons in the carpal tunnel are unremarkable. Carpal tunnel/median nerve: Flexor retinaculum  is intact. Normal carpal tunnel without a mass. Median nerve demonstrates normal signal and caliber. Guyon's canal: Normal Guyon's canal. Normal ulnar nerve. Joint/cartilage: Mild chondrosis of the first carpometacarpal joint. Mild radiocarpal chondrosis. There is minimal effusion of the DRUJ, radiocarpal and intercarpal joints. Minimal joint fluid at the first carpometacarpal joint. Bones/carpal alignment: There is bony edema in the mid to proximal first metacarpal with subchondral cystic lesion at the base of the first metacarpal measuring 0.5 x 0.4 cm. There is preserved T1 signal in the region of edema in the metacarpal and low T1 signal relation to the subchondral cyst Other: There is either a ganglion cyst along the ulnar wrist or fluid within the pisiform recess measuring 0.6 x 0.5 cm (axial T2 image 17). There is extensive soft tissue swelling along the radial palm and more proximally in the wrist along the volar aspect. There is deep fascial fluid between the flexor digitorum muscles and tendons and the superficial flexor carpi tendons. IMPRESSION: Extensive soft tissue swelling of the proximal wrist as can be seen in cellulitis. Tenosynovitis of the flexor carpi radialis tendon, possibly infectious, with adjacent interfascial fluid between the flexor carpi muscles and flexor digitorum just proximal to the wrist. 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

## 2021-09-27 NOTE — Plan of Care (Signed)

## 2021-09-27 NOTE — H&P (Addendum)
?History and Physical  ? ? ?PLEASE NOTE THAT DRAGON DICTATION SOFTWARE WAS USED IN THE CONSTRUCTION OF THIS NOTE. ? ? Jill Cox [id number] DOB: 05/10/1976 DOA: 09/26/2021 ? ?PCP: Pcp, No will further assess ?Patient coming from: home  ? ?I have personally briefly reviewed patient's old medical records in [place] ? ?Chief Complaint: Polyarthralgias ? ?HPI: Jill Cox is a 46 y.o. female with medical history significant for depression who is admitted to [facility] on 09/26/2021 by way of transfer from [facility] with MRSA bacteremia in the setting of epidural abscess of the thoracic spine, multiple suspected septic joints, after presenting from home to [facility] ED on 09/24/2021 complaining of polyarthralgias.  ? ?Please see hospital course documented in discharge summary.  However, in general, it appears that around 09/19/2021, the patient began experiencing polyarthralgias with distribution that included bilateral wrist, bilateral knees, and right ankle.  She reported in this capacity to her PCP, who initiated a 3-day course of prednisone, without any ensuing improvement in the symptoms.  Given the ongoing nature of these polyarthralgias, the patient then presented to [facility] ED on 09/23/2021 at which time she was started on Keflex, presumably for associated cellulitis.  Blood cultures drawn at the time of the ED visit ultimately grew MRSA, and the patient was contacted the subsequent day and instructed to return back to [facility] ED for further evaluation and management, leading to her formal admission on 09/24/2021 for MRSA bacteremia.  ? ?She was started on IV vancomycin, with repeat blood cultures also reportedly positive for MRSA.  Ensuing evaluation included several MRIs.  Within this radiographic evaluation, she underwent MRI of the bilateral knees demonstrating moderate to large effusions concerning for septic arthritis.  She also underwent right ankle MRI, which demonstrated evidence of effusion, potentially  septic in nature.  Additionally, MRIs of the bilateral wrists showed evidence of tenosynovitis without evidence of overt septic arthritis.  Subsequently, MRI of the thoracic spine showed evidence of epidural abscess at T2/T3.  Cardiology was consulted, and ultimately the patient underwent TTE as well as TEE which showed no evidence of bacterial endocarditis.  Of note, patient has no history of joint replacement, with all the above involved joints noted to be negative in nature.  Denies any history of recreational drug use, including no history of IV drug abuse. ? ?Infectious disease was consulted, and IV vancomycin continued, with infectious disease to continue to consult while the patient is at [facility].  Additionally, [facility] orthopedic surgery service was consulted on 09/25/2021.  They reportedly recommend diffuse joint washout involving the above affected joints, but states that there is services unable to perform the extensive joint washout required, with ensuing recommendation to transfer to [facility] where the orthopedic surgical service is more equipped to build to handle the extensive nature of the ensuing recommended poly joint washout procedures.  ? ?Of note, initial presentation on the day of admission on 09/24/2021 was associated with severe sepsis in the setting of leukocytosis, tachycardia, with mildly elevated lactate.  His vital signs in laboratory abnormalities resolved following initiation of IV fluids and aforementioned IV vancomycin.  Reportedly hemodynamically stable throughout her course at [facility]. ? ?Subsequent, the patient was transferred to [facility] for further evaluation and management of her MRSA bacteremia, epidural abscess of the thoracic spine, and multiple suspected septic joints requiring extensive washout procedure by orthopedic surgery, as above. ? ? ? ? ?Review of Systems: As per HPI otherwise 10 point review of systems negative.  ? ?  Past Medical History:  ?Diagnosis Date  ?  Depression   ? Engages in [place]   ? ? ?Past Surgical History:  ?Procedure Laterality Date  ? CHOLECYSTECTOMY    ? ? ?Social History: ? reports that she has never smoked. She has never used smokeless tobacco. She reports that she does not currently use alcohol. She reports that she does not use drugs. ? ? ?No Known Allergies ? ?Family History  ?Problem Relation Age of Onset  ? Heart disease Mother   ? Heart disease Father   ? ? ? ?Prior to Admission medications   ?Medication Sig Start Date End Date Taking? Authorizing Provider  ?acetaminophen (TYLENOL) 325 MG tablet Take 2 tablets (650 mg total) by mouth every 6 (six) hours as needed for mild pain or fever. 09/26/21   Lorella Nimrod, MD  ?cyclobenzaprine (FLEXERIL) 10 MG tablet Take 1 tablet (10 mg total) by mouth 3 (three) times daily as needed for muscle spasms. 09/17/21   Duffy Bruce, MD  ?enoxaparin (LOVENOX) 40 MG/0.4ML injection Inject 0.4 mLs (40 mg total) into the skin daily. 09/26/21   Lorella Nimrod, MD  ?HYDROmorphone (DILAUDID) 1 MG/ML injection Inject 0.5 mLs (0.5 mg total) into the vein every 4 (four) hours as needed for moderate pain or severe pain. 09/26/21   Lorella Nimrod, MD  ?naproxen (NAPROSYN) 500 MG tablet Take 1 tablet (500 mg total) by mouth 2 (two) times daily with a meal for 10 days. 09/23/21 10/03/21  Rada Hay, MD  ?nicotine (NICODERM CQ - DOSED IN MG/24 HOURS) 21 mg/24hr patch Place 1 patch (21 mg total) onto the skin daily. 09/27/21   Lorella Nimrod, MD  ?ondansetron [place]) 4 MG/2ML SOLN injection Inject 2 mLs (4 mg total) into the vein every 8 (eight) hours as needed for nausea or vomiting. 09/26/21   Lorella Nimrod, MD  ?oxyCODONE-acetaminophen (PERCOCET/ROXICET) 5-325 MG tablet Take 1 tablet by mouth every 4 (four) hours as needed for moderate pain. 09/26/21   Lorella Nimrod, MD  ?prochlorperazine (COMPAZINE) 10 MG/2ML injection Inject 2 mLs (10 mg total) into the vein every 6 (six) hours as needed. 09/26/21   Lorella Nimrod, MD   ?sertraline (ZOLOFT) 25 MG tablet Take 25 mg by mouth every morning. 09/18/21   [provider]  ?vancomycin HCl (VANCOREADY) 1000 MG/200ML SOLN Inject 200 mLs (1,000 mg total) into the vein every 12 (twelve) hours. 09/26/21   Lorella Nimrod, MD  ? ? ? ?Objective  ? ? ?Physical Exam: ?Vitals:  ? 09/26/21 2244 09/26/21 2336  ?BP: (!) 111/58   ?Pulse: 99   ?Resp: 17   ?Temp: 98.3 ?F (36.8 ?C)   ?TempSrc: Oral   ?SpO2: 97%   ?Weight:  81.7 kg  ?Height: 5\' 6"  (1.676 m)   ? ? ?General: appears to be stated age; alert, oriented ?Skin: warm, dry, no rash ?Head:  AT/[place] ?Mouth:  Oral mucosa membranes appear moist, normal dentition ?Neck: supple; trachea midline ?Heart:  RRR; did not appreciate any M/R/G ?Lungs: CTAB, did not appreciate any wheezes, rales, or rhonchi ?Abdomen: + BS; soft, ND, NT ?Vascular: 2+ pedal pulses b/l; 2+ radial pulses b/l ?Extremities: No swelling involving the bilateral wrists and bilateral knees; no muscle wasting ?Neuro: strength and sensation intact in upper and lower extremities b/l ? ? ? ?Labs on Admission: I have personally reviewed following labs and imaging studies ? ?CBC: ?Recent Labs  ?Lab 09/23/21 ?1207 09/24/21 ?1036 09/25/21 ?[id number]  ?WBC 16.0* 17.3* 11.3*  ?NEUTROABS 14.9* 15.4*  --   ?  HGB 14.1 12.5 11.3*  ?HCT 42.1 38.2 34.6*  ?MCV 90.5 92.7 92.3  ?PLT 130* 151 151  ? ?Basic Metabolic Panel: ?Recent Labs  ?Lab 09/23/21 ?1207 09/24/21 ?1036 09/25/21 ?[id number] 09/26/21 ?0446  ?NA 137 139 140  --   ?K 3.6 3.5 3.9  --   ?CL 104 103 109  --   ?CO2 22 26 22   --   ?GLUCOSE 108* 110* 117*  --   ?BUN 32* 32* 23*  --   ?CREATININE 1.27* 0.99 0.87 0.66  ?CALCIUM 10.5* 9.8 8.5*  --   ? ?GFR: ?Estimated Creatinine Clearance: 93.7 mL/min (by C-G formula based on SCr of 0.66 mg/dL). ?Liver Function Tests: ?Recent Labs  ?Lab 09/23/21 ?1207 09/24/21 ?1036  ?AST 54* 51*  ?ALT 59* 50*  ?ALKPHOS 121 119  ?BILITOT 1.2 1.1  ?PROT 6.5 6.1*  ?ALBUMIN 2.7* 2.4*  ? ?No results for input(s): LIPASE, AMYLASE in  the last 168 hours. ?No results for input(s): AMMONIA in the last 168 hours. ?Coagulation Profile: ?Recent Labs  ?Lab 09/24/21 ?1036  ?INR 1.1  ? ?Cardiac Enzymes: ?Recent Labs  ?Lab 09/23/21 ?1207 09/24/21 ?[place]

## 2021-09-27 NOTE — Assessment & Plan Note (Addendum)
-  Zoloft. ?  ?

## 2021-09-27 NOTE — Progress Notes (Addendum)
PHARMACY - PHYSICIAN COMMUNICATION ?CRITICAL VALUE ALERT - BLOOD CULTURE IDENTIFICATION (BCID) ? ?Jill Cox is an 46 y.o. female who presented to [facility] on 09/26/2021 with a chief complaint of MRSA bacteremia. Patient was transferred from [facility] where they were admitted 3/11 with chief complaint of joint aches, thigh cramps, and swelling to bilateral ankles and wrists. ? ?Assessment: Patient admitted to [facility] 3/11 where blood culture was rapidly ID'd as MRSA bacteremia. Patient was started on empiric therapy (vancomycin and zosyn x1) and was narrowed to vancomycin. Cultures drawn here reconfirmed + MRSA culture with 1/4 bottles positive (anaerobic, G+ cocci in clusters) for S. Aureus MecA and MREJ resistance.  ? ?Name of physician (or Provider) Contacted: Gershon Cull, N.P. ? ?Current antibiotics: Vancomycin IV 1000 mg twice daily ? ?Changes to prescribed antibiotics recommended:  ?Patient is on recommended antibiotics - No changes needed. ?Collect vancomycin peak and trough around dose given at 2254 on 3/15. ? ?Results for orders placed or performed during the hospital encounter of 09/26/21  ?Blood Culture ID Panel (Reflexed) (Collected: 09/27/2021  5:43 AM)  ?Result Value Ref Range  ? Enterococcus faecalis NOT DETECTED NOT DETECTED  ? Enterococcus Faecium NOT DETECTED NOT DETECTED  ? Listeria monocytogenes NOT DETECTED NOT DETECTED  ? Staphylococcus species DETECTED (A) NOT DETECTED  ? Staphylococcus aureus (BCID) DETECTED (A) NOT DETECTED  ? Staphylococcus epidermidis NOT DETECTED NOT DETECTED  ? Staphylococcus lugdunensis NOT DETECTED NOT DETECTED  ? Streptococcus species NOT DETECTED NOT DETECTED  ? Streptococcus agalactiae NOT DETECTED NOT DETECTED  ? Streptococcus pneumoniae NOT DETECTED NOT DETECTED  ? Streptococcus pyogenes NOT DETECTED NOT DETECTED  ? A.calcoaceticus-baumannii NOT DETECTED NOT DETECTED  ? Bacteroides fragilis NOT DETECTED NOT DETECTED  ? Enterobacterales NOT DETECTED NOT DETECTED  ?  Enterobacter cloacae complex NOT DETECTED NOT DETECTED  ? Escherichia coli NOT DETECTED NOT DETECTED  ? Klebsiella aerogenes NOT DETECTED NOT DETECTED  ? Klebsiella oxytoca NOT DETECTED NOT DETECTED  ? Klebsiella pneumoniae NOT DETECTED NOT DETECTED  ? Proteus species NOT DETECTED NOT DETECTED  ? Salmonella species NOT DETECTED NOT DETECTED  ? Serratia marcescens NOT DETECTED NOT DETECTED  ? Haemophilus influenzae NOT DETECTED NOT DETECTED  ? Neisseria meningitidis NOT DETECTED NOT DETECTED  ? Pseudomonas aeruginosa NOT DETECTED NOT DETECTED  ? Stenotrophomonas maltophilia NOT DETECTED NOT DETECTED  ? Candida albicans NOT DETECTED NOT DETECTED  ? Candida auris NOT DETECTED NOT DETECTED  ? Candida glabrata NOT DETECTED NOT DETECTED  ? Candida krusei NOT DETECTED NOT DETECTED  ? Candida parapsilosis NOT DETECTED NOT DETECTED  ? Candida tropicalis NOT DETECTED NOT DETECTED  ? Cryptococcus neoformans/gattii NOT DETECTED NOT DETECTED  ? Meth resistant mecA/C and MREJ DETECTED (A) NOT DETECTED  ? ? ?Carma Lair, PharmD Candidate [phone] ?09/27/2021  11:53 PM ? ?

## 2021-09-27 NOTE — Assessment & Plan Note (Addendum)
-  Moderate to large effusions identified on bilateral knees per MRIs performed at [facility], concerning for septic arthritis in bilateral distribution, in the setting of presenting MRSA bacteremia. ?-CT 3/17 suggested abcess of L wrist/forearm .MRI R LE 3/19 suggests possible abscesses and probable septic arthritis of R knee - MRI L LE 3/19 confirmed new abscess of L thigh . ?-S/P  left forearm I&D with fourth dorsal compartment extensor tenosynovectomy,I&D right gastrocnemius abscess, left thigh abscess - aspiration bilateral knee and right ankle joints ?No further plan for operative intervention as per orthopedics ?

## 2021-09-27 NOTE — Progress Notes (Signed)
?   ? ? ? ? ?[facility] ? ?Date of Admission:  09/26/2021    ? ? ?Lines: ?Peripheral iv's ? ?Abx: ?3/12-c vancomycin ? ? ?ASSESSMENT: ?46 yo female admitted in transfer from [facility] on 3/14 for severe metastatic mrsa sepsis ? ?No obvious risk factor for mrsa bsi ? ?She developed sx of polyarthralgia/bodyache and malaise 1-2 weeks prior to admission ? ?Initial evaluation at [facility] positive for bcx 3/11 (persistent daily until 3/13), all mri (thoracic/cervical spine), knees, wrists, femurs, right ankle are positive for soft tissue pyogenic focus and the spine mri series suggest posterior epidural abscess from cervical to upper thoracic area. She probably can benefit from lumbar mri as well given degree of involvement ? ?Neurosurgery and orthopedic surgery had evaluated her. At this time 09/27/2021 there is no indication for surgical intervention ? ?If by tomorrow she continues to be positive on blood cx, will need to change abx to combination therapy. There is potential repeat mri and echo also if persistent bacteremia ? ? ? ?PLAN: ?Continue vancomycin ?Follow up repeat bcx done today ?Will repeat blood cx tomorrow as well ?Discussed with primary team ? ?Principal Problem: ?  MRSA bacteremia ?Active Problems: ?  Depression ?  Septic joint (HCC) ?  Epidural abscess ?  Tenosynovitis ? ? ?No Known Allergies ? ?Scheduled Meds: ? sertraline  25 mg Oral q morning  ? ?Continuous Infusions: ? sodium chloride 100 mL/hr at 09/27/21 0819  ? albumin human 25 g (09/27/21 1000)  ? vancomycin 1,000 mg (09/27/21 0214)  ? ?PRN Meds:.acetaminophen **OR** acetaminophen, cyclobenzaprine, HYDROmorphone (DILAUDID) injection, naLOXone (NARCAN)  injection, ondansetron (ZOFRAN) IV ? ? ?SUBJECTIVE: ?Pain all over ?No improvement ?Last positive bcx 3/13; repeat 3/15 in process ? ?Moderate wbc elevation still ?No n/v/diarrhea ? ?Ortho/nsg evaluated -- no surgical intervention ? ?Review of Systems: ?ROS ?All other ROS was  negative, except mentioned above ? ? ? ? ?OBJECTIVE: ?Vitals:  ? 09/26/21 2244 09/26/21 2336 09/27/21 0151 09/27/21 0804  ?BP: (!) 111/58  (!) 118/58 (!) 121/58  ?Pulse: 99  98 (!) 109  ?Resp: 17  18 16   ?Temp: 98.3 ?F (36.8 ?C)   99.9 ?F (37.7 ?C)  ?TempSrc: Oral  Oral Oral  ?SpO2: 97%   95%  ?Weight:  81.7 kg    ?Height: 5\' 6"  (1.676 m)     ? ?Body mass index is 29.07 kg/m?. ? ?Physical Exam ?General/constitutional: moderate distress complaining of pain all over ?HEENT: Normocephalic, PER, Conj Clear, EOMI, Oropharynx clear ?Neck supple ?CV: rrr no mrg ?Lungs: clear to auscultation, normal respiratory effort ?Abd: Soft, Nontender ?Skin: No Rash ?Neuro: limited strength exam given diffuse pain ?MSK: edema /swelling bilateral wrists L>R; able to do active rom in knees/wrist/ankles although somewhat limited ? ? ?Lab Results ?Lab Results  ?Component Value Date  ? WBC 14.2 (H) 09/27/2021  ? HGB 10.3 (L) 09/27/2021  ? HCT 31.2 (L) 09/27/2021  ? MCV 92.9 09/27/2021  ? PLT 205 09/27/2021  ?  ?Lab Results  ?Component Value Date  ? CREATININE 0.67 09/27/2021  ? BUN 12 09/27/2021  ? NA 136 09/27/2021  ? K 3.8 09/27/2021  ? CL 102 09/27/2021  ? CO2 24 09/27/2021  ?  ?Lab Results  ?Component Value Date  ? ALT 32 09/27/2021  ? AST 28 09/27/2021  ? ALKPHOS 84 09/27/2021  ? BILITOT 1.0 09/27/2021  ?  ? ? ?Microbiology: ?Recent Results (from the past 240 hour(s))  ?Blood culture (single)  Status: Abnormal  ? Collection Time: 09/23/21  1:53 PM  ? Specimen: BLOOD  ?Result Value Ref Range Status  ? Specimen Description   Final  ?  BLOOD RIGHT ANTECUBITAL ?Performed at [facility], [address], [place], [place] [address] ?  ? Special Requests   Final  ?  BOTTLES DRAWN AEROBIC AND ANAEROBIC Blood Culture adequate volume ?Performed at [facility], [address], [place], [place] [address] ?  ? Culture  Setup Time   Final  ?  IN BOTH AEROBIC AND ANAEROBIC BOTTLES ?GRAM POSITIVE COCCI ?CRITICAL RESULT CALLED  TO, READ BACK BY AND VERIFIED WITH: ?Clovia Cuff [facility] AT 5449 09/24/2021 GAA ?Performed at [facility], [address] [place], [place] [address] ?  ? Culture METHICILLIN RESISTANT STAPHYLOCOCCUS AUREUS (A)  Final  ? Report Status 09/26/2021 FINAL  Final  ? Organism ID, Bacteria METHICILLIN RESISTANT STAPHYLOCOCCUS AUREUS  Final  ?    Susceptibility  ? Methicillin resistant staphylococcus aureus - MIC*  ?  CIPROFLOXACIN <=0.5 SENSITIVE Sensitive   ?  ERYTHROMYCIN <=0.25 SENSITIVE Sensitive   ?  GENTAMICIN <=0.5 SENSITIVE Sensitive   ?  OXACILLIN >=4 RESISTANT Resistant   ?  TETRACYCLINE <=1 SENSITIVE Sensitive   ?  VANCOMYCIN 1 SENSITIVE Sensitive   ?  TRIMETH/SULFA <=10 SENSITIVE Sensitive   ?  CLINDAMYCIN <=0.25 SENSITIVE Sensitive   ?  RIFAMPIN <=0.5 SENSITIVE Sensitive   ?  Inducible Clindamycin NEGATIVE Sensitive   ?  * METHICILLIN RESISTANT STAPHYLOCOCCUS AUREUS  ?Blood Culture ID Panel (Reflexed)     Status: Abnormal  ? Collection Time: 09/23/21  1:53 PM  ?Result Value Ref Range Status  ? Enterococcus faecalis NOT DETECTED NOT DETECTED Final  ? Enterococcus Faecium NOT DETECTED NOT DETECTED Final  ? Listeria monocytogenes NOT DETECTED NOT DETECTED Final  ? Staphylococcus species DETECTED (A) NOT DETECTED Final  ?  Comment: CRITICAL RESULT CALLED TO, READ BACK BY AND VERIFIED WITH: ?Clovia Cuff PHARMD AT 0522 09/24/2021 GAA ?  ? Staphylococcus aureus (BCID) DETECTED (A) NOT DETECTED Final  ?  Comment: Methicillin (oxacillin)-resistant Staphylococcus aureus (MRSA). MRSA is predictably resistant to beta-lactam antibiotics (except ceftaroline). Preferred therapy is vancomycin unless clinically contraindicated. Patient requires contact precautions if  ?hospitalized. ?CRITICAL RESULT CALLED TO, READ BACK BY AND VERIFIED WITH: ?Clovia Cuff PHARMD AT 7121 09/24/2021 GAA ?  ? Staphylococcus epidermidis NOT DETECTED NOT DETECTED Final  ? Staphylococcus lugdunensis NOT DETECTED NOT DETECTED Final  ? Streptococcus  species NOT DETECTED NOT DETECTED Final  ? Streptococcus agalactiae NOT DETECTED NOT DETECTED Final  ? Streptococcus pneumoniae NOT DETECTED NOT DETECTED Final  ? Streptococcus pyogenes NOT DETECTED NOT DETECTED Final  ? A.calcoaceticus-baumannii NOT DETECTED NOT DETECTED Final  ? Bacteroides fragilis NOT DETECTED NOT DETECTED Final  ? Enterobacterales NOT DETECTED NOT DETECTED Final  ? Enterobacter cloacae complex NOT DETECTED NOT DETECTED Final  ? Escherichia coli NOT DETECTED NOT DETECTED Final  ? Klebsiella aerogenes NOT DETECTED NOT DETECTED Final  ? Klebsiella oxytoca NOT DETECTED NOT DETECTED Final  ? Klebsiella pneumoniae NOT DETECTED NOT DETECTED Final  ? Proteus species NOT DETECTED NOT DETECTED Final  ? Salmonella species NOT DETECTED NOT DETECTED Final  ? Serratia marcescens NOT DETECTED NOT DETECTED Final  ? Haemophilus influenzae NOT DETECTED NOT DETECTED Final  ? Neisseria meningitidis NOT DETECTED NOT DETECTED Final  ? Pseudomonas aeruginosa NOT DETECTED NOT DETECTED Final  ? Stenotrophomonas maltophilia NOT DETECTED NOT DETECTED Final  ? Candida albicans NOT  DETECTED NOT DETECTED Final  ? Candida auris NOT DETECTED NOT DETECTED Final  ? Candida glabrata NOT DETECTED NOT DETECTED Final  ? Candida krusei NOT DETECTED NOT DETECTED Final  ? Candida parapsilosis NOT DETECTED NOT DETECTED Final  ? Candida tropicalis NOT DETECTED NOT DETECTED Final  ? Cryptococcus neoformans/gattii NOT DETECTED NOT DETECTED Final  ? Meth resistant mecA/C and MREJ DETECTED (A) NOT DETECTED Final  ?  Comment: CRITICAL RESULT CALLED TO, READ BACK BY AND VERIFIED WITH: ?Clovia CuffLISA KLUTTZ PHARMD AT 0522 09/24/2021 GAA ?Performed at [facility], [address] [place], [place] [address] ?  ?Resp Panel by RT-PCR (Flu A&B, Covid) Nasopharyngeal Swab     Status: None  ? Collection Time: 09/24/21 10:36 AM  ? Specimen: Nasopharyngeal Swab; Nasopharyngeal(NP) swabs in vial transport medium  ?Result Value Ref Range Status  ? SARS  Coronavirus 2 by RT PCR NEGATIVE NEGATIVE Final  ?  Comment: (NOTE) ?SARS-CoV-2 target nucleic acids are NOT DETECTED. ? ?The SARS-CoV-2 RNA is generally detectable in upper respiratory ?specimens durin

## 2021-09-27 NOTE — Consult Note (Signed)
Reason for Consult: Cervical thoracic epidural abscess neck pain ?Referring Physician: [facility] hospitalist ? ?Jill Cox is an 46 y.o. female.  ?HPI: 46 year old female presented to [facility] couple days ago with severe neck pain and longstanding history of polyarthralgia is being treated with anti-inflammatories and prednisone.  Work-up however revealed multiple sources of septic arthropathies and ultimately thoracic MRI scan showed probable infection with dorsal epidural abscess extending from T3 up into her lower cervical spine.  Patient denies any numbness tingling arms legs denies any weakness says her difficulty has primarily been neck and shoulder pain.  As well as her knees and wrists. ? ?Past Medical History:  ?Diagnosis Date  ? Depression   ? Engages in vaping   ? ? ?Past Surgical History:  ?Procedure Laterality Date  ? CHOLECYSTECTOMY    ? ? ?Family History  ?Problem Relation Age of Onset  ? Heart disease Mother   ? Heart disease Father   ? ? ?Social History:  reports that she has never smoked. She has never used smokeless tobacco. She reports that she does not currently use alcohol. She reports that she does not use drugs. ? ?Allergies: No Known Allergies ? ?Medications: I have reviewed the patient's current medications. ? ?Results for orders placed or performed during the hospital encounter of 09/26/21 (from the past 48 hour(s))  ?Magnesium     Status: None  ? Collection Time: 09/27/21  5:43 AM  ?Result Value Ref Range  ? Magnesium 2.0 1.7 - 2.4 mg/dL  ?  Comment: Performed at [facility], [address], [place], [place] [address]  ?Comprehensive metabolic panel     Status: Abnormal  ? Collection Time: 09/27/21  5:43 AM  ?Result Value Ref Range  ? Sodium 136 135 - 145 mmol/L  ? Potassium 3.8 3.5 - 5.1 mmol/L  ? Chloride 102 98 - 111 mmol/L  ? CO2 24 22 - 32 mmol/L  ? Glucose, Bld 107 (H) 70 - 99 mg/dL  ?  Comment: Glucose reference range applies only to samples taken after fasting for at least 8  hours.  ? BUN 12 6 - 20 mg/dL  ? Creatinine, Ser 0.67 0.44 - 1.00 mg/dL  ? Calcium 7.3 (L) 8.9 - 10.3 mg/dL  ? Total Protein 4.6 (L) 6.5 - 8.1 g/dL  ? Albumin <1.5 (L) 3.5 - 5.0 g/dL  ? AST 28 15 - 41 U/L  ? ALT 32 0 - 44 U/L  ? Alkaline Phosphatase 84 38 - 126 U/L  ? Total Bilirubin 1.0 0.3 - 1.2 mg/dL  ? GFR, Estimated >60 >60 mL/min  ?  Comment: (NOTE) ?Calculated using the CKD-EPI Creatinine Equation (2021) ?  ? Anion gap 10 5 - 15  ?  Comment: Performed at [facility], [address], [place], [place] [address]  ?CBC with Differential/Platelet     Status: Abnormal  ? Collection Time: 09/27/21  5:43 AM  ?Result Value Ref Range  ? WBC 14.2 (H) 4.0 - 10.5 K/uL  ? RBC 3.36 (L) 3.87 - 5.11 MIL/uL  ? Hemoglobin 10.3 (L) 12.0 - 15.0 g/dL  ? HCT 31.2 (L) 36.0 - 46.0 %  ? MCV 92.9 80.0 - 100.0 fL  ? MCH 30.7 26.0 - 34.0 pg  ? MCHC 33.0 30.0 - 36.0 g/dL  ? RDW 14.2 11.5 - 15.5 %  ? Platelets 205 150 - 400 K/uL  ? nRBC 0.0 0.0 - 0.2 %  ? Neutrophils Relative % 92 %  ?  Neutro Abs 13.1 (H) 1.7 - 7.7 K/uL  ? Lymphocytes Relative 5 %  ? Lymphs Abs 0.7 0.7 - 4.0 K/uL  ? Monocytes Relative 3 %  ? Monocytes Absolute 0.4 0.1 - 1.0 K/uL  ? Eosinophils Relative 0 %  ? Eosinophils Absolute 0.0 0.0 - 0.5 K/uL  ? Basophils Relative 0 %  ? Basophils Absolute 0.0 0.0 - 0.1 K/uL  ? WBC Morphology See Note   ?  Comment: Mild Left Shift. 1 to 5% Metas and Myelos, Occ Pro Noted.  ? nRBC 0 0 /100 WBC  ? Abs Immature Granulocytes 0.00 0.00 - 0.07 K/uL  ?  Comment: Performed at [facility], [address], [place], [place] [address]  ? ? ?MR THORACIC SPINE WO CONTRAST ? ?Result Date: 09/26/2021 ?CLINICAL DATA:  Epidural abscess EXAM: MRI THORACIC SPINE WITHOUT CONTRAST TECHNIQUE: Multiplanar, multisequence MR imaging of the thoracic spine was performed. No intravenous contrast was administered. COMPARISON:  None. FINDINGS: Alignment:  Physiologic. Vertebrae: No evidence of acute fracture or aggressive osseous lesion. There is no  evidence of discitis. Cord: There is slight anterior displacement of the cord above the level of T2 but no effacement or abnormal cord signal. Paraspinal and other soft tissues: Small bilateral pleural effusions. There is a probable sebaceous cyst along the right lower back measuring 1.3 x 1.4 cm (axial medic image 35). Disc levels: There is a right facet effusion at T2-T3 with adjacent edema and fluid collecting in the interspinous space (sagittal stir images 4-9). There is adjacent paraspinal edema bilaterally. There is a posterior epidural fluid collection, extending from the upper thoracic spine into the cervical spine beyond the field of view, inferior margin is at the level of T3-T4. This measures at maximum 5 mm in thickness the level of T2 (axial T2 image 9). This results in mild canal narrowing without cord effacement. There is mild bony edema within the right T2 facet and T2 spinous process. IMPRESSION: Septic right facet joint at T2-T3 with associated posterior epidural phlegmon/possible abscess measuring up to 5 mm in thickness, extending from upper thoracic spine into the cervical spine beyond the field of view. The inferior margin of this collection is at the level of T3-T4. Adjacent paraspinal edema and an interspinous fluid collecting at T2-T3 which may be infectious. Recommend cervical spine MRI with and without contrast for further evaluation, with inclusion of the T3-T4 level within the field of view if possible. Small bilateral pleural effusions. These results were called by telephone at the time of interpretation on 09/26/2021 at 4:39 pm to provider CORNELIUS VAN DAM , who verbally acknowledged these results. Electronically Signed   By: Caprice Renshaw M.D.   On: 09/26/2021 16:45  ? ?MR WRIST RIGHT WO CONTRAST ? ?Result Date: 09/26/2021 ?CLINICAL DATA:  Septic arthritis suspected, wrist, no prior imaging EXAM: MR OF THE RIGHT WRIST WITHOUT CONTRAST TECHNIQUE: Multiplanar, multisequence MR imaging of  the right wrist was performed. No intravenous contrast was administered. COMPARISON:  None. FINDINGS: Ligaments: Scapholunate and lunotriquetral ligaments are intact. Triangular fibrocartilage: Central perforation of the TFCC articular disc. Tendons: Mild focal tenosynovitis of extensor compartments 2 and 4 at the level of the midcarpal joints. There is tenosynovitis of the flexor carpi radialis tendon just proximal to the wrist. The flexor tendons in the carpal tunnel are unremarkable. Carpal tunnel/median nerve: Flexor retinaculum is intact. Normal carpal tunnel without a mass. Median nerve demonstrates normal signal and caliber. Guyon's canal: Normal Guyon's canal. Normal ulnar nerve.  Joint/cartilage: Mild chondrosis of the first carpometacarpal joint. Mild radiocarpal chondrosis. There is minimal effusion of the DRUJ, radiocarpal and intercarpal joints. Minimal joint fluid at the first carpometacarpal joint. Bones/carpal alignment: There is bony edema in the mid to proximal first metacarpal with subchondral cystic lesion at the base of the first metacarpal measuring 0.5 x 0.4 cm. There is preserved T1 signal in the region of edema in the metacarpal and low T1 signal relation to the subchondral cyst Other: There is either a ganglion cyst along the ulnar wrist or fluid within the pisiform recess measuring 0.6 x 0.5 cm (axial T2 image 17). There is extensive soft tissue swelling along the radial palm and more proximally in the wrist along the volar aspect. There is deep fascial fluid between the flexor digitorum muscles and tendons and the superficial flexor carpi tendons. IMPRESSION: Extensive soft tissue swelling of the proximal wrist as can be seen in cellulitis. Tenosynovitis of the flexor carpi radialis tendon, possibly infectious, with adjacent interfascial fluid between the flexor carpi muscles and flexor digitorum just proximal to the wrist. 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 No significant joint effusion at this t

## 2021-09-27 NOTE — Assessment & Plan Note (Addendum)
MRI of the thoracic spine performed during [facility] hospital course suggestive of epidural abscess at T2/T3, without associated acute focal neurologic deficits.  Neurosurgery thinks that there is no evidence of cord compression . ?-MRI of the C-spine with dorsal epidural abscess throughout the cervical spine contributing to moderate spinal stenosis.  No cord signal abnormality. ?-Per neurosurgery no surgical intervention at this time and recommending continuation of IV antibiotics. ? ?  ?

## 2021-09-27 NOTE — TOC Initial Note (Addendum)
Transition of Care (TOC) - Initial/Assessment Note  ? ? ?Patient Details  ?Name: Jill Cox ?MRN: [id number] ?Date of Birth: 05-13-1976 ? ?Transition of Care [facility]) CM/SW Contact:    ?Sharin Mons, RN ?Phone Number: ?09/27/2021, 7:41 AM ? ?Clinical Narrative:                 ?Admitted with MRSA bacteremia/ epidural abscess of the thoracic spine, multiple suspected septic joints. Hx of depression , vaping. From home with husband. ? ?PCP: Bubba Camp, [facility] in [place]  ? ?ID consult pending... ?3/14 tee, no vegetation ?  ? ?TOC team following and will assist with needs. ? ?Expected Discharge Plan: Home/Self Care ?Barriers to Discharge: Continued Medical Work up ? ? ?Patient Goals and CMS Choice ?  ?  ?  ? ?Expected Discharge Plan and Services ?Expected Discharge Plan: Home/Self Care ?  ?  ?  ?  ?                ?  ?  ?  ?  ?  ?  ?  ?  ?  ?  ? ?Prior Living Arrangements/Services ?  ?  ?  ?       ?  ?  ?  ?  ? ?Activities of Daily Living ?Home Assistive Devices/Equipment: None ?ADL Screening (condition at time of admission) ?Patient's cognitive ability adequate to safely complete daily activities?: Yes ?Is the patient deaf or have difficulty hearing?: No ?Does the patient have difficulty seeing, even when wearing glasses/contacts?: No ?Does the patient have difficulty concentrating, remembering, or making decisions?: No ?Patient able to express need for assistance with ADLs?: Yes ?Does the patient have difficulty dressing or bathing?: No ?Independently performs ADLs?: Yes (appropriate for developmental age) ?Does the patient have difficulty walking or climbing stairs?: No ?Weakness of Legs: None ?Weakness of Arms/Hands: Both ? ?Permission Sought/Granted ?  ?  ?   ?   ?   ?   ? ?Emotional Assessment ?  ?  ?  ?  ?  ?  ? ?Admission diagnosis:  Septic joint ([place]) [M00.9] ?Patient Active Problem List  ? Diagnosis Date Noted  ? Septic joint ([place]) 09/27/2021  ? 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 Tenosynovitis  09/27/2021  ? MRSA bacteremia 09/24/2021  ? Polyarthritis 09/24/2021  ? Severe sepsis ([place]) 09/24/2021  ? Hypercalcemia 09/24/2021  ? Depression 09/24/2021  ? Engages in [place] 09/24/2021  ? ?PCP:  Pcp, No ?Pharmacy:   ?[id number] - [place] [place] [address] ?Phone: [phone] Fax: [phone] ? ? ? ? ?Social Determinants of Health (SDOH) Interventions ?  ? ?Readmission Risk Interventions ?No flowsheet data found. ? ? ?

## 2021-09-28 ENCOUNTER — Inpatient Hospital Stay (HOSPITAL_COMMUNITY): Payer: 59

## 2021-09-28 DIAGNOSIS — F32A Depression, unspecified: Secondary | ICD-10-CM | POA: Diagnosis not present

## 2021-09-28 DIAGNOSIS — D649 Anemia, unspecified: Secondary | ICD-10-CM

## 2021-09-28 DIAGNOSIS — M659 Synovitis and tenosynovitis, unspecified: Secondary | ICD-10-CM | POA: Diagnosis not present

## 2021-09-28 DIAGNOSIS — E876 Hypokalemia: Secondary | ICD-10-CM | POA: Clinically undetermined

## 2021-09-28 DIAGNOSIS — K5900 Constipation, unspecified: Secondary | ICD-10-CM | POA: Diagnosis not present

## 2021-09-28 DIAGNOSIS — E8809 Other disorders of plasma-protein metabolism, not elsewhere classified: Secondary | ICD-10-CM

## 2021-09-28 DIAGNOSIS — B9562 Methicillin resistant Staphylococcus aureus infection as the cause of diseases classified elsewhere: Secondary | ICD-10-CM | POA: Diagnosis not present

## 2021-09-28 DIAGNOSIS — R7881 Bacteremia: Secondary | ICD-10-CM | POA: Diagnosis not present

## 2021-09-28 DIAGNOSIS — G062 Extradural and subdural abscess, unspecified: Secondary | ICD-10-CM | POA: Diagnosis not present

## 2021-09-28 DIAGNOSIS — E8770 Fluid overload, unspecified: Secondary | ICD-10-CM | POA: Diagnosis present

## 2021-09-28 LAB — IRON AND TIBC
Iron: 17 ug/dL — ABNORMAL LOW (ref 28–170)
Saturation Ratios: 12 % (ref 10.4–31.8)
TIBC: 146 ug/dL — ABNORMAL LOW (ref 250–450)
UIBC: 129 ug/dL

## 2021-09-28 LAB — BASIC METABOLIC PANEL
Anion gap: 9 (ref 5–15)
BUN: 10 mg/dL (ref 6–20)
CO2: 25 mmol/L (ref 22–32)
Calcium: 7.3 mg/dL — ABNORMAL LOW (ref 8.9–10.3)
Chloride: 99 mmol/L (ref 98–111)
Creatinine, Ser: 0.66 mg/dL (ref 0.44–1.00)
GFR, Estimated: >60 mL/min (ref >60)
Glucose, Bld: 152 mg/dL — ABNORMAL HIGH (ref 70–99)
Potassium: 3.4 mmol/L — ABNORMAL LOW (ref 3.5–5.1)
Sodium: 133 mmol/L — ABNORMAL LOW (ref 135–145)

## 2021-09-28 LAB — CBC WITH DIFFERENTIAL/PLATELET
Abs Immature Granulocytes: 2.06 10*3/uL — ABNORMAL HIGH (ref 0.00–0.07)
Basophils Absolute: 0.1 10*3/uL (ref 0.0–0.1)
Basophils Relative: 1 %
Eosinophils Absolute: 0 10*3/uL (ref 0.0–0.5)
Eosinophils Relative: 0 %
HCT: 27.4 % — ABNORMAL LOW (ref 36.0–46.0)
Hemoglobin: 9 g/dL — ABNORMAL LOW (ref 12.0–15.0)
Immature Granulocytes: 14 %
Lymphocytes Relative: 4 %
Lymphs Abs: 0.6 10*3/uL — ABNORMAL LOW (ref 0.7–4.0)
MCH: 30.7 pg (ref 26.0–34.0)
MCHC: 32.8 g/dL (ref 30.0–36.0)
MCV: 93.5 fL (ref 80.0–100.0)
Monocytes Absolute: 0.7 10*3/uL (ref 0.1–1.0)
Monocytes Relative: 5 %
Neutro Abs: 11.4 10*3/uL — ABNORMAL HIGH (ref 1.7–7.7)
Neutrophils Relative %: 76 %
Platelets: 199 10*3/uL (ref 150–400)
RBC: 2.93 MIL/uL — ABNORMAL LOW (ref 3.87–5.11)
RDW: 14 % (ref 11.5–15.5)
Smear Review: NORMAL
WBC: 14.9 10*3/uL — ABNORMAL HIGH (ref 4.0–10.5)
nRBC: 0 % (ref 0.0–0.2)

## 2021-09-28 LAB — RETICULOCYTES
Immature Retic Fract: 32.2 % — ABNORMAL HIGH (ref 2.3–15.9)
RBC.: 2.94 MIL/uL — ABNORMAL LOW (ref 3.87–5.11)
Retic Count, Absolute: 31.2 10*3/uL (ref 19.0–186.0)
Retic Ct Pct: 1.1 % (ref 0.4–3.1)

## 2021-09-28 LAB — FOLATE: Folate: 13.8 ng/mL (ref >5.9)

## 2021-09-28 LAB — FERRITIN: Ferritin: 1149 ng/mL — ABNORMAL HIGH (ref 11–307)

## 2021-09-28 LAB — CULTURE, BLOOD (ROUTINE X 2)

## 2021-09-28 LAB — VANCOMYCIN, PEAK: Vancomycin Pk: 37 ug/mL (ref 30–40)

## 2021-09-28 LAB — VITAMIN B12: Vitamin B-12: 2853 pg/mL — ABNORMAL HIGH (ref 180–914)

## 2021-09-28 LAB — MAGNESIUM: Magnesium: 2.1 mg/dL (ref 1.7–2.4)

## 2021-09-28 IMAGING — MR MR LUMBAR SPINE W/O CM
4 of 5 series · 26 of 48 positions shown · non-contrast
Comparison: None.

CLINICAL DATA: Epidural abscess

EXAM:
MRI LUMBAR SPINE WITHOUT CONTRAST
TECHNIQUE: Multiplanar, multisequence MR imaging of the lumbar spine was
performed. No intravenous contrast was administered.

[Series 5: T2 · sagittal · 4.0mm · 0.80mm/px · 6 of 17 slices shown (1 of 2)]
[im 1/17]
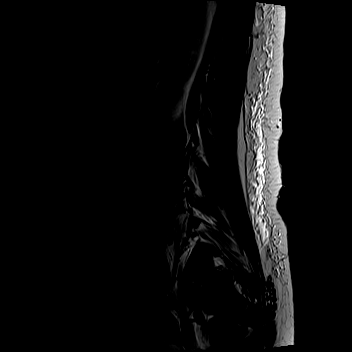
[im 4/17]
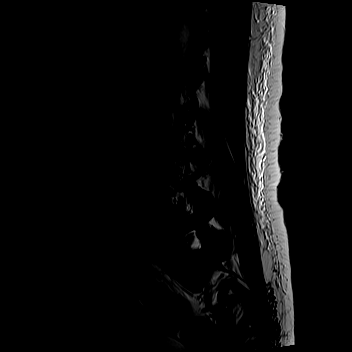
[im 7/17]
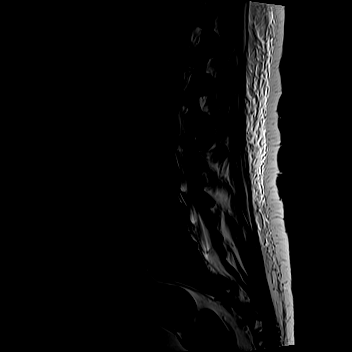
[im 10/17]
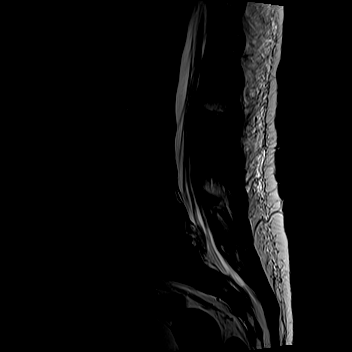
[im 13/17]
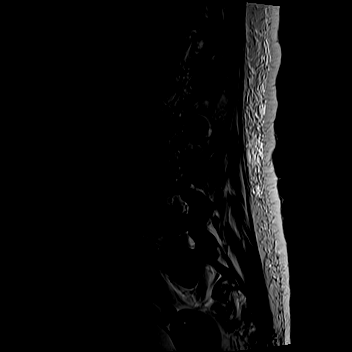
[im 17/17]
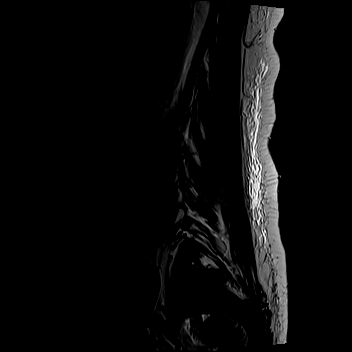

[Series 7: T1 · sagittal · 4.0mm · 0.88mm/px · 6 of 17 slices shown (1 of 2)]
[im 1/17]
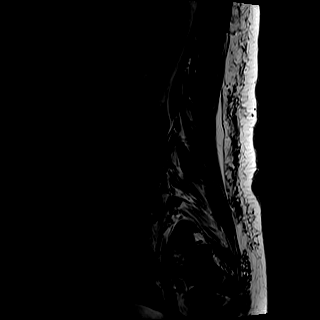
[im 4/17]
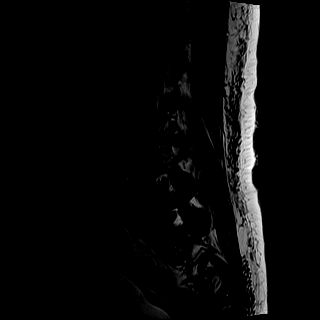
[im 7/17]
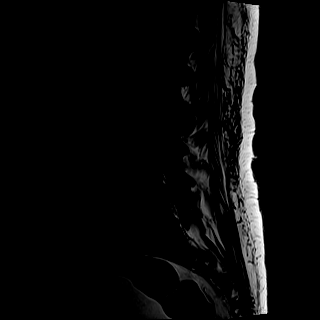
[im 10/17]
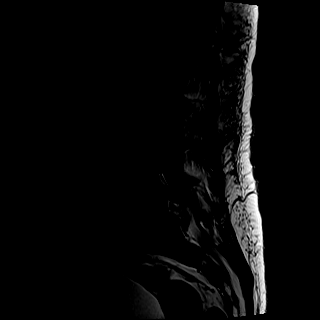
[im 13/17]
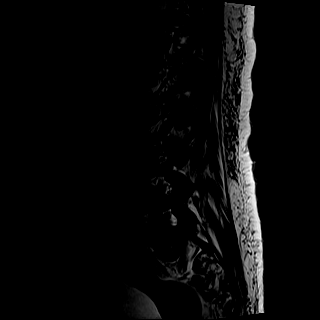
[im 17/17]
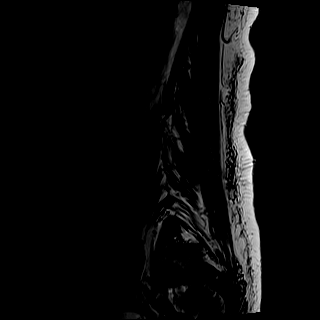

[Series 8: T2 · axial · 4.0mm · 0.57mm/px · z∈[-101,+137]mm · 9 of 46 slices shown (2 of 2)]
[im 1/46]
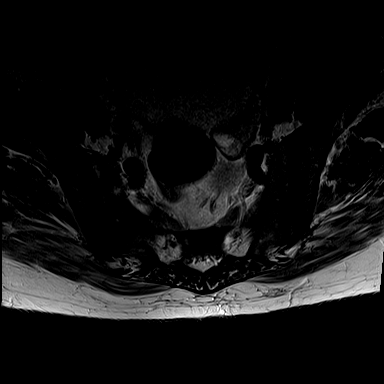
[im 7/46]
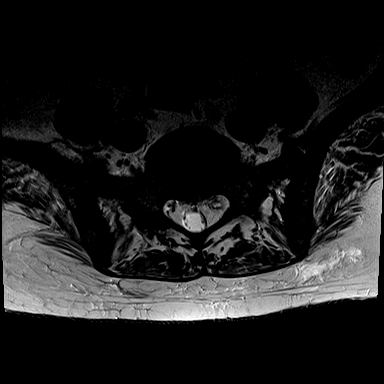
[im 13/46]
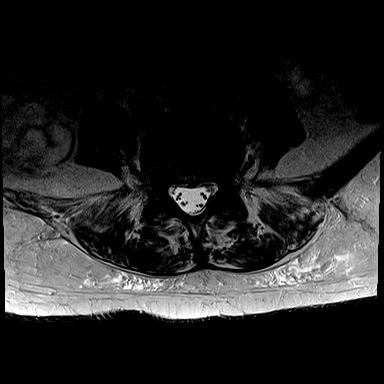
[im 20/46]
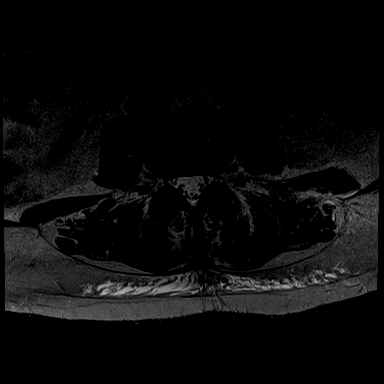
[im 23/46]
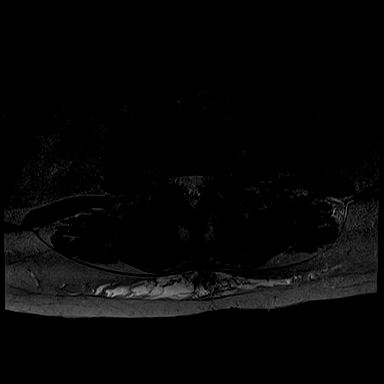
[im 26/46]
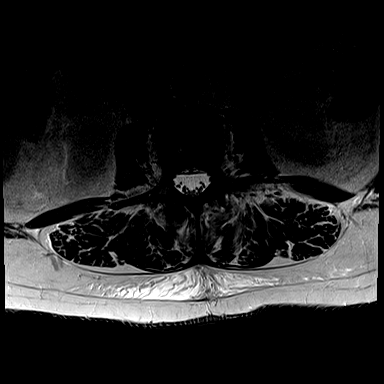
[im 33/46]
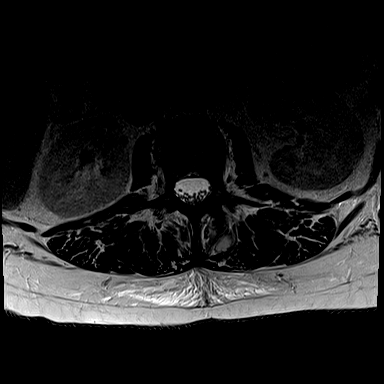
[im 39/46]
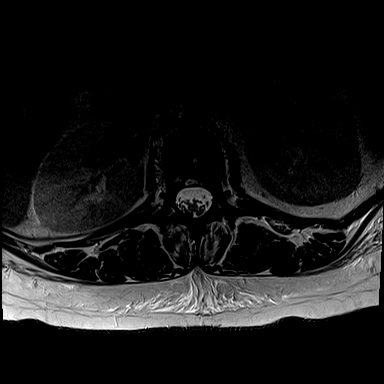
[im 46/46]
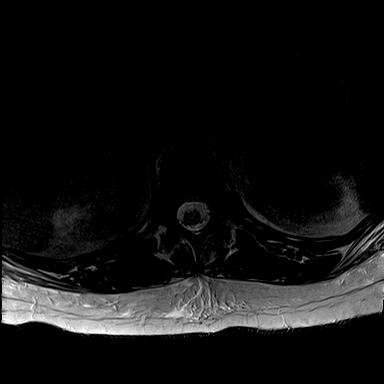

[Series 9: T1 · axial · 4.0mm · 0.34mm/px · z∈[-101,+102]mm · 5 of 46 slices shown (2 of 2)]
[im 1/46]
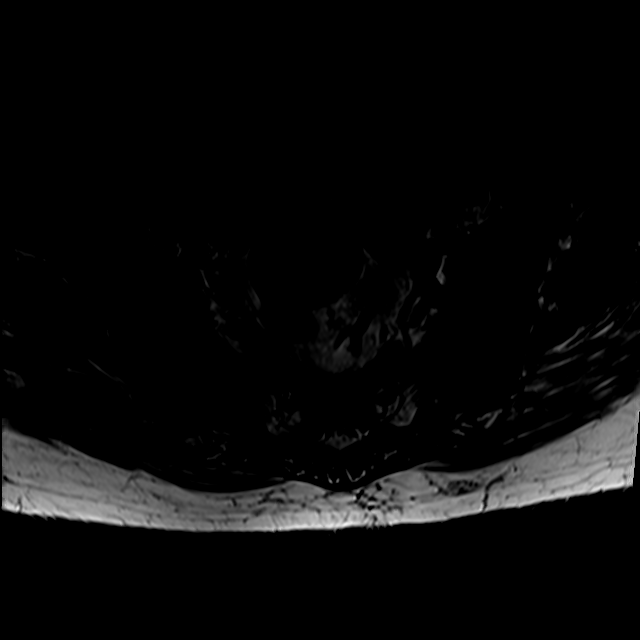
[im 7/46]
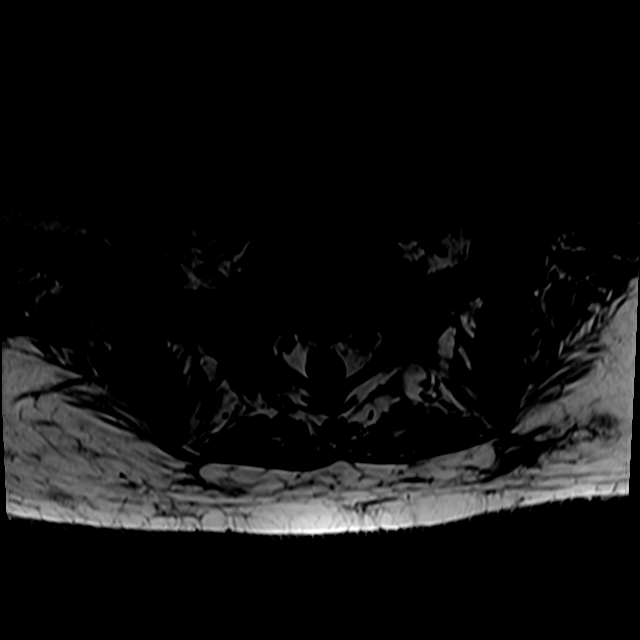
[im 13/46]
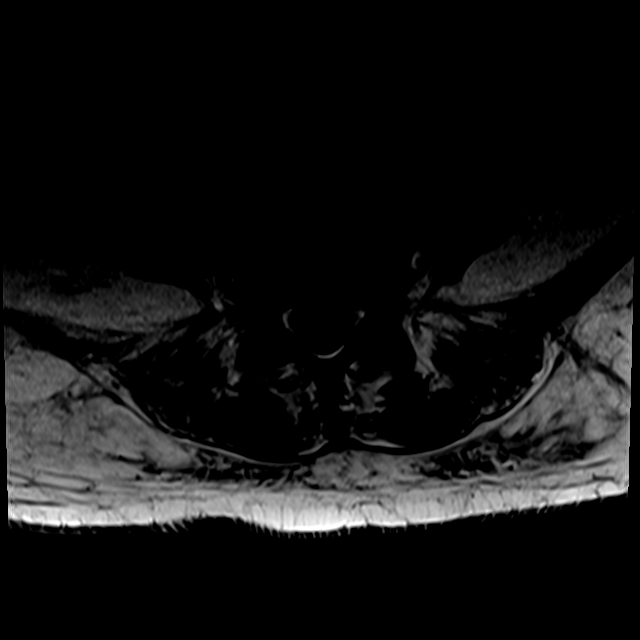
[im 23/46]
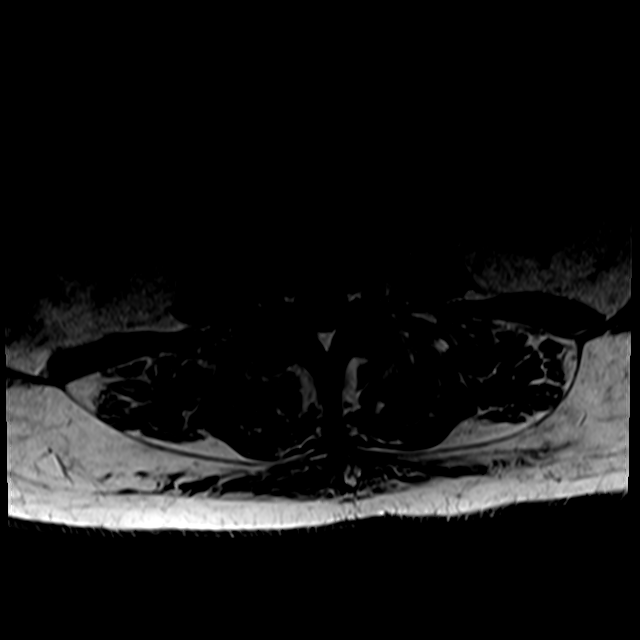
[im 39/46]
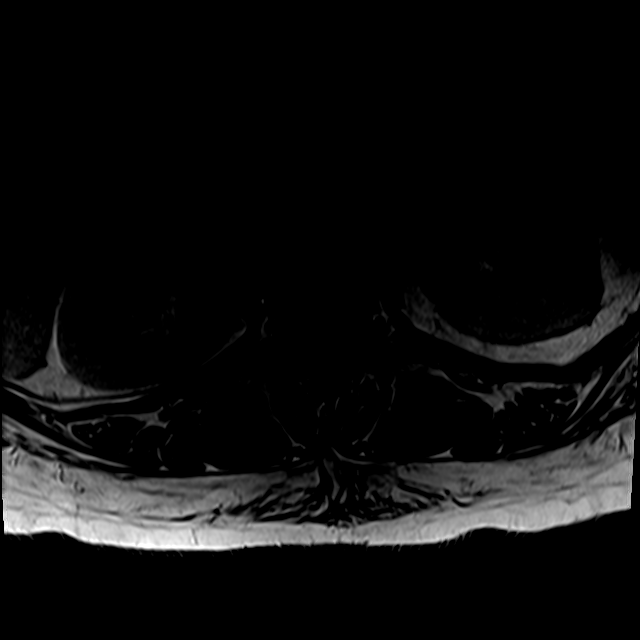

[26 of 48 positions shown; findings below may reference images not displayed]

FINDINGS: Segmentation:  Standard.

Alignment:  Physiologic.

Vertebrae: No acute fracture. Increased T2 signal in the L4-L5 disc,
with mildly increased T2 signal in the L5 vertebral body and at the
inferior L4 endplate. No suspicious osseous lesion.

Conus medullaris and cauda equina: Conus extends to the L1-L2 level.
Small T1 heterogeneous and T2 hyperintense collection at the left
aspect of the thecal sac at L5, measuring approximately 10 x 11 x 23
mm (series 8, image 36 and series 5, image 10), which indents upon
the left aspect of the thecal sac and likely compresses the
descending L5 nerve roots. Conus and cauda equina appear otherwise
normal.

Paraspinal and other soft tissues: Negative.

Disc levels:

T12-L1: No significant disc bulge. No spinal canal stenosis or
neural foraminal narrowing.

L1-L2: No significant disc bulge. Mild facet arthropathy. No spinal
canal stenosis or neural foraminal narrowing.

L2-L3: No significant disc bulge. Mild facet arthropathy. No spinal
canal stenosis or neural foraminal narrowing.

L3-L4: No significant disc bulge. Mild facet arthropathy. No spinal
canal stenosis or neural foraminal narrowing.

L4-L5: Disc height loss and mild disc bulge. Moderate facet
arthropathy. No spinal canal stenosis or neural foraminal narrowing.

L5-S1: Left eccentric disc bulge. Moderate facet arthropathy. No
spinal canal stenosis. Mild left neural foraminal narrowing.
IMPRESSION: Evaluation is somewhat limited in the absence of intravenous
contrast. Posterior to the L5 vertebral body, there is a T2
hyperintense collection that could represent early infection and
left lateral recess phlegmon, but could also represent benign
extruded disc material. In addition, there is mildly increased T2
signal at the inferior aspect of L4 and in the L5 vertebral body,
with increased T2 signal in the L4-L5 intervertebral disc, which are
nonspecific but could represent discitis osteomyelitis. Consider
repeat L-spine MRI with and without contrast in 10-14 days.

## 2021-09-28 MED ORDER — ENSURE ENLIVE PO LIQD
237.0000 mL | Freq: Two times a day (BID) | ORAL | Status: DC
  Administered 2021-09-29 – 2021-10-06 (×8): 237 mL via ORAL

## 2021-09-28 MED ORDER — HYDROCODONE-ACETAMINOPHEN 5-325 MG PO TABS
1.0000 | ORAL_TABLET | ORAL | Status: DC | PRN
  Administered 2021-09-28 – 2021-10-06 (×28): 1 via ORAL
  Filled 2021-09-28 (×28): qty 1

## 2021-09-28 MED ORDER — FUROSEMIDE 10 MG/ML IJ SOLN
40.0000 mg | Freq: Once | INTRAMUSCULAR | Status: AC
  Administered 2021-09-28: 40 mg via INTRAVENOUS
  Filled 2021-09-28: qty 4

## 2021-09-28 MED ORDER — FUROSEMIDE 10 MG/ML IJ SOLN
40.0000 mg | Freq: Once | INTRAMUSCULAR | Status: AC
  Administered 2021-09-29: 40 mg via INTRAVENOUS
  Filled 2021-09-28: qty 4

## 2021-09-28 MED ORDER — POTASSIUM CHLORIDE CRYS ER 10 MEQ PO TBCR
40.0000 meq | EXTENDED_RELEASE_TABLET | Freq: Once | ORAL | Status: AC
Start: 2021-09-28 — End: 2021-09-28
  Administered 2021-09-28: 40 meq via ORAL
  Filled 2021-09-28: qty 4

## 2021-09-28 MED ORDER — SODIUM CHLORIDE 0.9 % IV SOLN
8.0000 mg/kg | Freq: Every day | INTRAVENOUS | Status: DC
  Administered 2021-09-28 – 2021-10-05 (×8): 650 mg via INTRAVENOUS
  Filled 2021-09-28 (×9): qty 13

## 2021-09-28 MED ORDER — SENNOSIDES-DOCUSATE SODIUM 8.6-50 MG PO TABS
1.0000 | ORAL_TABLET | Freq: Two times a day (BID) | ORAL | Status: DC
  Administered 2021-09-28 – 2021-10-06 (×13): 1 via ORAL
  Filled 2021-09-28 (×13): qty 1

## 2021-09-28 MED ORDER — SODIUM CHLORIDE 0.9 % IV SOLN
600.0000 mg | Freq: Three times a day (TID) | INTRAVENOUS | Status: DC
  Administered 2021-09-28 – 2021-10-06 (×25): 600 mg via INTRAVENOUS
  Filled 2021-09-28 (×29): qty 20

## 2021-09-28 MED ORDER — POLYETHYLENE GLYCOL 3350 17 G PO PACK
17.0000 g | PACK | Freq: Every day | ORAL | Status: DC
  Administered 2021-09-28 – 2021-10-06 (×6): 17 g via ORAL
  Filled 2021-09-28 (×6): qty 1

## 2021-09-28 NOTE — Progress Notes (Signed)
Pharmacy Antibiotic Note ? ?Jill Cox is a 46 y.o. female admitted on 09/26/2021 with  MRSA bacteremia, septic arthritis of the knees, epidural abscess .  Pharmacy has been consulted for Vancomycin dosing. ? ?D5 Vancomycin ? ?WBC 14.9, Tmax 103.1 3/15 > afebrile ?SCr 0.66 - at baseline ? ?Per ID - double coverage for persistent MRSA bacteremia and high burden of disease  ? ?Plan: ?Stop vancomycin ?Start Ceftaroline 600 mg IV every 8 hours ?Start Daptomycin 650 mg (8mg /kg) IV every 24 hours ?Monitor renal function, CBC, repeat BCx ?F/U LOT ? ?Temp (24hrs), Avg:100 ?F (37.8 ?C), Min:98 ?F (36.7 ?C), Max:103.1 ?F (39.5 ?C) ? ?Recent Labs  ?Lab 09/23/21 ?1207 09/24/21 ?1036 09/24/21 ?1243 09/24/21 ?1703 09/24/21 ?2024 09/25/21 ?[id number] 09/26/21 ?0446 09/27/21 ?[id number] 09/28/21 ?0018  ?WBC 16.0* 17.3*  --   --   --  11.3*  --  14.2* 14.9*  ?CREATININE 1.27* 0.99  --   --   --  0.87 0.66 0.67 0.66  ?LATICACIDVEN  --  2.2* 2.5* 1.7 1.9  --   --   --   --   ?VANCOPEAK  --   --   --   --   --   --   --   --  55  ?  ?Estimated Creatinine Clearance: 93.7 mL/min (by C-G formula based on SCr of 0.66 mg/dL).   ? ?No Known Allergies ? ?Antimicrobials this admission: ?Cephalexin 3/11 x1 ?Zosyn 3/12 x1 ?Vancomycin 3/12 >> 3/16 ?Daptomycin 3/16 >>. ?Ceftaroline 3/16 >> ? ?Dose adjustments this admission: ?none ? ?Microbiology results: ?3/12 UCx: 50k MRSA ?3/12 BCx: MRSA ?3/13 BCx: MRSA ?3/15 BCx: 1/4 MRSA ?3/16 BCx: ip ? ?Thank you for allowing pharmacy to be a part of this patient?s care. ? ?Laurey Arrow, PharmD ?PGY1 Pharmacy Resident ?09/28/2021  9:34 AM ? ?Please check [email or web address] for unit-specific pharmacy phone numbers. ? ?

## 2021-09-28 NOTE — Assessment & Plan Note (Addendum)
-   Patient noted on lab work to have a albumin level of < 1.5 from 2.7 on 09/23/2021. ?-unclear etiology ?-Patient was given IV albumin ?-Placed on Ensure supplementation. ?-Diet has been liberalized to a regular diet. ? ?

## 2021-09-28 NOTE — Assessment & Plan Note (Addendum)
-   Patient with anasarca ?-2D echo/TEE done during this admission with normal EF,NWMA. ?-Unclear etiology but hypoalbuminemia might have contributed ?-On IV lasix,will continue for now ?

## 2021-09-28 NOTE — Progress Notes (Signed)
Subjective: ?Patient reports  doing okay condition of pain mostly in her tailbone with soreness in her hands and soreness in her thighs.  She denies any numbness and tingling or weakness in arms and legs ? ?Objective: ?Vital signs in last 24 hours: ?Temp:  [98 ?F (36.7 ?C)-103.1 ?F (39.5 ?C)] 98 ?F (36.7 ?C) (03/16 5885) ?Pulse Rate:  [95-109] 98 (03/16 0738) ?Resp:  [15-20] 19 (03/16 0738) ?BP: (112-138)/(58-70) 112/64 (03/16 0277) ?SpO2:  [91 %-95 %] 94 % (03/16 0738) ? ?Intake/Output from previous day: ?03/15 0701 - 03/16 0700 ?In: 1739.3 [P.O.:240; I.V.:269.9; IV Piggyback:989.4] ?Out: 700 [Urine:700] ?Intake/Output this shift: ?No intake/output data recorded. ? ?Patient is awake and alert but febrile strength remains 5 out of 5 upper and lower extremities ? ?Lab Results: ?Recent Labs  ?  09/27/21 ?4128 09/28/21 ?0018  ?WBC 14.2* 14.9*  ?HGB 10.3* 9.0*  ?HCT 31.2* 27.4*  ?PLT 205 199  ? ?BMET ?Recent Labs  ?  09/27/21 ?7867 09/28/21 ?0018  ?NA 136 133*  ?K 3.8 3.4*  ?CL 102 99  ?CO2 24 25  ?GLUCOSE 107* 152*  ?BUN 12 10  ?CREATININE 0.67 0.66  ?CALCIUM 7.3* 7.3*  ? ? ?Studies/Results: ?MR THORACIC SPINE WO CONTRAST ? ?Result Date: 09/26/2021 ?CLINICAL DATA:  Epidural abscess EXAM: MRI THORACIC SPINE WITHOUT CONTRAST TECHNIQUE: Multiplanar, multisequence MR imaging of the thoracic spine was performed. No intravenous contrast was administered. COMPARISON:  None. FINDINGS: Alignment:  Physiologic. Vertebrae: No evidence of acute fracture or aggressive osseous lesion. There is no evidence of discitis. Cord: There is slight anterior displacement of the cord above the level of T2 but no effacement or abnormal cord signal. Paraspinal and other soft tissues: Small bilateral pleural effusions. There is a probable sebaceous cyst along the right lower back measuring 1.3 x 1.4 cm (axial medic image 35). Disc levels: There is a right facet effusion at T2-T3 with adjacent edema and fluid collecting in the interspinous space  (sagittal stir images 4-9). There is adjacent paraspinal edema bilaterally. There is a posterior epidural fluid collection, extending from the upper thoracic spine into the cervical spine beyond the field of view, inferior margin is at the level of T3-T4. This measures at maximum 5 mm in thickness the level of T2 (axial T2 image 9). This results in mild canal narrowing without cord effacement. There is mild bony edema within the right T2 facet and T2 spinous process. IMPRESSION: Septic right facet joint at T2-T3 with associated posterior epidural phlegmon/possible abscess measuring up to 5 mm in thickness, extending from upper thoracic spine into the cervical spine beyond the field of view. The inferior margin of this collection is at the level of T3-T4. Adjacent paraspinal edema and an interspinous fluid collecting at T2-T3 which may be infectious. Recommend cervical spine MRI with and without contrast for further evaluation, with inclusion of the T3-T4 level within the field of view if possible. Small bilateral pleural effusions. These results were called by telephone at the time of interpretation on 09/26/2021 at 4:39 pm to provider CORNELIUS VAN DAM , who verbally acknowledged these results. Electronically Signed   By: Caprice Renshaw M.D.   On: 09/26/2021 16:45  ? ?MR LUMBAR SPINE WO CONTRAST ? ?Result Date: 09/28/2021 ?CLINICAL DATA:  Epidural abscess EXAM: MRI LUMBAR SPINE WITHOUT CONTRAST TECHNIQUE: Multiplanar, multisequence MR imaging of the lumbar spine was performed. No intravenous contrast was administered. COMPARISON:  None. FINDINGS: Segmentation:  Standard. Alignment:  Physiologic. Vertebrae: No acute fracture. Increased T2 signal in  the L4-L5 disc, with mildly increased T2 signal in the L5 vertebral body and at the inferior L4 endplate. No suspicious osseous lesion. Conus medullaris and cauda equina: Conus extends to the L1-L2 level. Small T1 heterogeneous and T2 hyperintense collection at the left aspect  of the thecal sac at L5, measuring approximately 10 x 11 x 23 mm (series 8, image 36 and series 5, image 10), which indents upon the left aspect of the thecal sac and likely compresses the descending L5 nerve roots. Conus and cauda equina appear otherwise normal. Paraspinal and other soft tissues: Negative. Disc levels: T12-L1: No significant disc bulge. No spinal canal stenosis or neural foraminal narrowing. L1-L2: No significant disc bulge. Mild facet arthropathy. No spinal canal stenosis or neural foraminal narrowing. L2-L3: No significant disc bulge. Mild facet arthropathy. No spinal canal stenosis or neural foraminal narrowing. L3-L4: No significant disc bulge. Mild facet arthropathy. No spinal canal stenosis or neural foraminal narrowing. L4-L5: Disc height loss and mild disc bulge. Moderate facet arthropathy. No spinal canal stenosis or neural foraminal narrowing. L5-S1: Left eccentric disc bulge. Moderate facet arthropathy. No spinal canal stenosis. Mild left neural foraminal narrowing. IMPRESSION: Evaluation is somewhat limited in the absence of intravenous contrast. Posterior to the L5 vertebral body, there is a T2 hyperintense collection that could represent early infection and left lateral recess phlegmon, but could also represent benign extruded disc material. In addition, there is mildly increased T2 signal at the inferior aspect of L4 and in the L5 vertebral body, with increased T2 signal in the L4-L5 intervertebral disc, which are nonspecific but could represent discitis osteomyelitis. Consider repeat L-spine MRI with and without contrast in 10-14 days. Electronically Signed   By: Wiliam KeAlison  Vasan M.D.   On: 09/28/2021 04:09  ? ?MR CERVICAL SPINE W WO CONTRAST ? ?Result Date: 09/27/2021 ?CLINICAL DATA:  Epidural abscess. EXAM: MRI CERVICAL SPINE WITHOUT AND WITH CONTRAST TECHNIQUE: Multiplanar and multiecho pulse sequences of the cervical spine, to include the craniocervical junction and cervicothoracic  junction, were obtained without and with intravenous contrast. CONTRAST:  8mL GADAVIST GADOBUTROL 1 MMOL/ML IV SOLN COMPARISON:  Thoracic spine MRI 09/26/2021 FINDINGS: Alignment: Straightening of the normal cervical lordosis. No listhesis. Vertebrae: No fracture, suspicious marrow lesion, evidence of discitis, or significant marrow edema in the cervical spine. Right facet joint effusion at T2-3 as noted on the recent thoracic spine MRI. Cord: The dorsal epidural fluid collection in the upper thoracic spine shown on the recent thoracic spine MRI extends throughout the cervical spine, terminating at the C1-2 level. The collection measures up to 5 mm in AP thickness and demonstrates diffuse peripheral enhancement. There is mild mass effect on the cervical spinal cord without cord edema. Posterior Fossa, vertebral arteries, paraspinal tissues: No paraspinal fluid collection in the cervical spine. Partially visualized soft tissue inflammation in the upper thoracic spine. Disc levels: Moderate spinal stenosis at C4-5, C5-6, and C6-7 due to the dorsal epidural collection and mild disc bulging. Mild spinal stenosis at C3-4 due to the epidural collection. Mild multilevel neural foraminal stenosis, most notable on the right at C5-6 due to uncovertebral spurring. 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 No cord signal abnormality. Electronically Signed   By: Sebastian AcheAllen  Grady M.D.   On: 09/27/2021 11:30  ? ?MR WRIST RIGHT WO CONTRAST ? ?Result Date: 09/26/2021 ?CLINICAL DATA:  Septic arthritis suspected, wrist, no prior imaging EXAM: MR OF THE RIGHT WRIST WITHOUT CONTRAST TECHNIQUE: Multiplanar, multisequence MR imaging  of the right wrist was performed. No intravenous contrast was administered. COMPARISON:  None. FINDINGS: Ligaments: Scapholunate and lunotriquetral ligaments are intact. Triangular fibrocartilage: Central perforation of the TFCC articular disc.  Tendons: Mild focal tenosynovitis of extensor compartments 2 and 4 at the level of the midcarpal joints. There is tenosynovitis of the flexor carpi radialis tendon just proximal to the wrist. The flexor ten

## 2021-09-28 NOTE — Progress Notes (Signed)
Ortho Note ? ?Patient seen again today around 1230.  No real improvement in lower extremities or upper extremities.  She still has minimal pain with gentle short arc range of motion of the knee.  She is significantly edematous to both bilateral lower extremities and left upper extremity.  Her left hand appears more red and erythematous than it did yesterday.  Her lower extremities appear similar.  She looks more ill today than yesterday's appearance but she is currently febrile. ? ?ID has changed antibiotic regimen and hopefully this will improve her symptoms.  Right leg seems to be more symptomatic than the left.  Hilbert Odor will follow up tomorrow for reevaluation.  If no improvement in the right lower extremity we may proceed with aspiration of the knee.  Also of the left upper extremity does not improve I would likely recommend a hand consult. ? ?Shona Needles, MD ?[facility] ?[phone] (office) ?[email or web address] ? ?

## 2021-09-28 NOTE — Progress Notes (Signed)
?   ? ? ? ? ?[facility] ? ?Date of Admission:  09/26/2021    ? ? ?Lines: ?Peripheral iv's ? ?Abx: ?3/12-c vancomycin ? ? ?ASSESSMENT: ?46 yo female admitted in transfer from [facility] on 3/14 for severe metastatic mrsa sepsis ? ?No obvious risk factor for mrsa bsi ? ?She developed sx of polyarthralgia/bodyache and malaise 1-2 weeks prior to admission ? ?Initial evaluation at [facility] positive for bcx 3/11 (persistent daily until 3/13), all mri (thoracic/cervical spine), knees, wrists, femurs, right ankle are positive for soft tissue pyogenic focus and the spine mri series suggest posterior epidural abscess from cervical to upper thoracic area. She probably can benefit from lumbar mri as well given degree of involvement ? ?3/16 assessment ?Neurosurgery and orthopedic surgery had evaluated her. At this time 09/28/2021 there is no indication for surgical intervention ?Her 3/15 blood cx is still positive (since 3/11 plus 2 weeks of symptoms at home). As no surgical source control is possible at this time, will change abx to dapto/ceftaryline in hope of more early clearance of bacteremia ? ?L-spine mri shows potential C2 involvement as well  ? ?PLAN: ?Stop vancomycin ?Start daptomycin/ceftaroline  ?Repeat blood culture today and tomorrow ?Discussed with primary team ? ?Principal Problem: ?  MRSA bacteremia ?Active Problems: ?  Depression ?  Septic joint (HCC) ?  Epidural abscess ?  Tenosynovitis ? ? ?No Known Allergies ? ?Scheduled Meds: ? sertraline  25 mg Oral q morning  ? ?Continuous Infusions: ? albumin human 25 g (09/28/21 0905)  ? ceFTAROline (TEFLARO) IV 600 mg (09/28/21 1029)  ? DAPTOmycin (CUBICIN)  IV    ? ?PRN Meds:.acetaminophen **OR** acetaminophen, cyclobenzaprine, HYDROmorphone (DILAUDID) injection, naLOXone (NARCAN)  injection, ondansetron (ZOFRAN) IV ? ? ?SUBJECTIVE: ?Pain improving ?3/15 blood cx still positive ?No visual sx, headache, urinary incontinence, or LE  weakness/numbness ? ? ?Review of Systems: ?ROS ?All other ROS was negative, except mentioned above ? ? ? ? ?OBJECTIVE: ?Vitals:  ? 09/27/21 1716 09/27/21 2337 09/28/21 0406 09/28/21 0738  ?BP:  118/62 138/70 112/64  ?Pulse:  95 98 98  ?Resp:  15 20 19   ?Temp: (!) 100.7 ?F (38.2 ?C) 98.7 ?F (37.1 ?C) 99.4 ?F (37.4 ?C) 98 ?F (36.7 ?C)  ?TempSrc: Oral Oral Oral Oral  ?SpO2:  91% 93% 94%  ?Weight:      ?Height:      ? ?Body mass index is 29.07 kg/m?. ? ?Physical Exam ?General/constitutional: no distress, pleasant, conversant ?HEENT: Normocephalic, PER, Conj Clear, EOMI, Oropharynx clear ?Neck supple ?CV: rrr no mrg ?Lungs: clear to auscultation, normal respiratory effort ?Abd: Soft, Nontender ?Ext: no edema ?Skin/msk: slight erythema left dorsal of wrist; knees/wrist active rom relatively intact, but with mild-moderate tenderness ?Neuro: strength exam shows bilateral LE 4-5/5 symmetric ? ? ? ?Lab Results ?Lab Results  ?Component Value Date  ? WBC 14.9 (H) 09/28/2021  ? HGB 9.0 (L) 09/28/2021  ? HCT 27.4 (L) 09/28/2021  ? MCV 93.5 09/28/2021  ? PLT 199 09/28/2021  ?  ?Lab Results  ?Component Value Date  ? CREATININE 0.66 09/28/2021  ? BUN 10 09/28/2021  ? NA 133 (L) 09/28/2021  ? K 3.4 (L) 09/28/2021  ? CL 99 09/28/2021  ? CO2 25 09/28/2021  ?  ?Lab Results  ?Component Value Date  ? ALT 32 09/27/2021  ? AST 28 09/27/2021  ? ALKPHOS 84 09/27/2021  ? BILITOT 1.0 09/27/2021  ?  ? ? ?Microbiology: ?Recent Results (from the past 240 hour(s))  ?Blood  culture (single)     Status: Abnormal  ? Collection Time: 09/23/21  1:53 PM  ? Specimen: BLOOD  ?Result Value Ref Range Status  ? Specimen Description   Final  ?  BLOOD RIGHT ANTECUBITAL ?Performed at [facility], [address] [place], [place] [address] ?  ? Special Requests   Final  ?  BOTTLES DRAWN AEROBIC AND ANAEROBIC Blood Culture adequate volume ?Performed at [facility], [address] [place], [place] [address] ?  ? Culture  Setup Time   Final  ?   IN BOTH AEROBIC AND ANAEROBIC BOTTLES ?GRAM POSITIVE COCCI ?CRITICAL RESULT CALLED TO, READ BACK BY AND VERIFIED WITH: ?Clovia CuffLISA KLUTTZ [facility] AT [id number] 09/24/2021 GAA ?Performed at [facility], [address] [place], [place] [address] ?  ? Culture METHICILLIN RESISTANT STAPHYLOCOCCUS AUREUS (A)  Final  ? Report Status 09/26/2021 FINAL  Final  ? Organism ID, Bacteria METHICILLIN RESISTANT STAPHYLOCOCCUS AUREUS  Final  ?    Susceptibility  ? Methicillin resistant staphylococcus aureus - MIC*  ?  CIPROFLOXACIN <=0.5 SENSITIVE Sensitive   ?  ERYTHROMYCIN <=0.25 SENSITIVE Sensitive   ?  GENTAMICIN <=0.5 SENSITIVE Sensitive   ?  OXACILLIN >=4 RESISTANT Resistant   ?  TETRACYCLINE <=1 SENSITIVE Sensitive   ?  VANCOMYCIN 1 SENSITIVE Sensitive   ?  TRIMETH/SULFA <=10 SENSITIVE Sensitive   ?  CLINDAMYCIN <=0.25 SENSITIVE Sensitive   ?  RIFAMPIN <=0.5 SENSITIVE Sensitive   ?  Inducible Clindamycin NEGATIVE Sensitive   ?  * METHICILLIN RESISTANT STAPHYLOCOCCUS AUREUS  ?Blood Culture ID Panel (Reflexed)     Status: Abnormal  ? Collection Time: 09/23/21  1:53 PM  ?Result Value Ref Range Status  ? Enterococcus faecalis NOT DETECTED NOT DETECTED Final  ? Enterococcus Faecium NOT DETECTED NOT DETECTED Final  ? Listeria monocytogenes NOT DETECTED NOT DETECTED Final  ? Staphylococcus species DETECTED (A) NOT DETECTED Final  ?  Comment: CRITICAL RESULT CALLED TO, READ BACK BY AND VERIFIED WITH: ?Clovia CuffLISA KLUTTZ PHARMD AT 0522 09/24/2021 GAA ?  ? Staphylococcus aureus (BCID) DETECTED (A) NOT DETECTED Final  ?  Comment: Methicillin (oxacillin)-resistant Staphylococcus aureus (MRSA). MRSA is predictably resistant to beta-lactam antibiotics (except ceftaroline). Preferred therapy is vancomycin unless clinically contraindicated. Patient requires contact precautions if  ?hospitalized. ?CRITICAL RESULT CALLED TO, READ BACK BY AND VERIFIED WITH: ?Clovia CuffLISA KLUTTZ PHARMD AT [phone] 09/24/2021 GAA ?  ? Staphylococcus epidermidis NOT DETECTED NOT DETECTED  Final  ? Staphylococcus lugdunensis NOT DETECTED NOT DETECTED Final  ? Streptococcus species NOT DETECTED NOT DETECTED Final  ? Streptococcus agalactiae NOT DETECTED NOT DETECTED Final  ? Streptococcus pneumoniae NOT DETECTED NOT DETECTED Final  ? Streptococcus pyogenes NOT DETECTED NOT DETECTED Final  ? A.calcoaceticus-baumannii NOT DETECTED NOT DETECTED Final  ? Bacteroides fragilis NOT DETECTED NOT DETECTED Final  ? Enterobacterales NOT DETECTED NOT DETECTED Final  ? Enterobacter cloacae complex NOT DETECTED NOT DETECTED Final  ? Escherichia coli NOT DETECTED NOT DETECTED Final  ? Klebsiella aerogenes NOT DETECTED NOT DETECTED Final  ? Klebsiella oxytoca NOT DETECTED NOT DETECTED Final  ? Klebsiella pneumoniae NOT DETECTED NOT DETECTED Final  ? Proteus species NOT DETECTED NOT DETECTED Final  ? Salmonella species NOT DETECTED NOT DETECTED Final  ? Serratia marcescens NOT DETECTED NOT DETECTED Final  ? Haemophilus influenzae NOT DETECTED NOT DETECTED Final  ? Neisseria meningitidis NOT DETECTED NOT DETECTED Final  ? Pseudomonas aeruginosa NOT DETECTED NOT DETECTED Final  ? Stenotrophomonas maltophilia NOT DETECTED NOT DETECTED  Final  ? Candida albicans NOT DETECTED NOT DETECTED Final  ? Candida auris NOT DETECTED NOT DETECTED Final  ? Candida glabrata NOT DETECTED NOT DETECTED Final  ? Candida krusei NOT DETECTED NOT DETECTED Final  ? Candida parapsilosis NOT DETECTED NOT DETECTED Final  ? Candida tropicalis NOT DETECTED NOT DETECTED Final  ? Cryptococcus neoformans/gattii NOT DETECTED NOT DETECTED Final  ? Meth resistant mecA/C and MREJ DETECTED (A) NOT DETECTED Final  ?  Comment: CRITICAL RESULT CALLED TO, READ BACK BY AND VERIFIED WITH: ?Clovia Cuff PHARMD AT 0522 09/24/2021 GAA ?Performed at [facility], [address], [place], [place] [address] ?  ?Resp Panel by RT-PCR (Flu A&B, Covid) Nasopharyngeal Swab     Status: None  ? Collection Time: 09/24/21 10:36 AM  ? Specimen: Nasopharyngeal Swab;  Nasopharyngeal(NP) swabs in vial transport medium  ?Result Value Ref Range Status  ? SARS Coronavirus 2 by RT PCR NEGATIVE NEGATIVE Final  ?  Comment: (NOTE) ?SARS-CoV-2 target nucleic acids are NOT DETECTED. ? ?The SARS-CoV-2

## 2021-09-28 NOTE — Assessment & Plan Note (Addendum)
-  Patient with no overt bleeding ?-Anemia panel showed low iron of 17,given IV iron ?-Follow H&H. ?-Hb stable ?

## 2021-09-28 NOTE — Assessment & Plan Note (Addendum)
-   Monitor and supplement as needed ?

## 2021-09-28 NOTE — Assessment & Plan Note (Addendum)
-  On MiraLAX daily, Senokot-S twice daily. ?

## 2021-09-28 NOTE — Progress Notes (Signed)
?PROGRESS NOTE ? ? ? Jill Cox  [id number] DOB: Jan 12, 1976 DOA: 09/26/2021 ?PCP: Bubba Camp, [place]  ? ? ?No chief complaint on file. ? ? ?Brief Narrative:  ?HPI per Dr. Velia Meyer ?Jill Cox is a 46 y.o. female with medical history significant for depression who is admitted to [facility] on 09/26/2021 by way of transfer from [facility] with MRSA bacteremia in the setting of epidural abscess of the thoracic spine, multiple suspected septic joints, after presenting from home to [facility] ED on 09/24/2021 complaining of polyarthralgias.  ?  ?Please see hospital course documented in discharge summary.  However, in general, it appears that around 09/19/2021, the patient began experiencing polyarthralgias with distribution that included bilateral wrist, bilateral knees, and right ankle.  She reported in this capacity to her PCP, who initiated a 3-day course of prednisone, without any ensuing improvement in the symptoms.  Given the ongoing nature of these polyarthralgias, the patient then presented to [facility] ED on 09/23/2021 at which time she was started on Keflex, presumably for associated cellulitis.  Blood cultures drawn at the time of the ED visit ultimately grew MRSA, and the patient was contacted the subsequent day and instructed to return back to [facility] ED for further evaluation and management, leading to her formal admission on 09/24/2021 for MRSA bacteremia.  ?  ?She was started on IV vancomycin, with repeat blood cultures also reportedly positive for MRSA.  Ensuing evaluation included several MRIs.  Within this radiographic evaluation, she underwent MRI of the bilateral knees demonstrating moderate to large effusions concerning for septic arthritis.  She also underwent right ankle MRI, which demonstrated evidence of effusion, potentially septic in nature.  Additionally, MRIs of the bilateral wrists showed evidence of tenosynovitis without evidence of overt septic arthritis.  Subsequently, MRI of the thoracic spine  showed evidence of epidural abscess at T2/T3.  Cardiology was consulted, and ultimately the patient underwent TTE as well as TEE which showed no evidence of bacterial endocarditis.  Of note, patient has no history of joint replacement, with all the above involved joints noted to be negative in nature.  Denies any history of recreational drug use, including no history of IV drug abuse. ?  ?Infectious disease was consulted, and IV vancomycin continued, with infectious disease to continue to consult while the patient is at [facility].  Additionally, [facility] orthopedic surgery service was consulted on 09/25/2021.  They reportedly recommend diffuse joint washout involving the above affected joints, but states that there is services unable to perform the extensive joint washout required, with ensuing recommendation to transfer to [facility] where the orthopedic surgical service is more equipped to build to handle the extensive nature of the ensuing recommended poly joint washout procedures.  ?  ?Of note, initial presentation on the day of admission on 09/24/2021 was associated with severe sepsis in the setting of leukocytosis, tachycardia, with mildly elevated lactate.  His vital signs in laboratory abnormalities resolved following initiation of IV fluids and aforementioned IV vancomycin.  Reportedly hemodynamically stable throughout her course at [facility]. ?  ?Subsequent, the patient was transferred to [facility] for further evaluation and management of her MRSA bacteremia, epidural abscess of the thoracic spine, and multiple suspected septic joints requiring extensive washout procedure by orthopedic surgery, as above. ? ?-Orthopedics, neurosurgery, ID consulted.Patient ?-Patient placed empirically on IV vancomycin ? ?   ? ? ?Assessment & Plan: ? Principal Problem: ?  MRSA bacteremia ?Active Problems: ?  Septic joint ([place]) ?  Tenosynovitis ?  Epidural  abscess ?  Depression ?  Hypoalbuminemia: Severe ?  Hypokalemia ?   Constipation ?  Anemia ?  Edema due to hypervolemia ? ? ? ?Assessment and Plan: ?* MRSA bacteremia ?#) MRSA bacteremia: Initial blood cultures positive for MRSA on 09/23/2021, with repeat blood cultures drawn following interval initiation of IV vancomycin also positive during hospital course at [facility].  ?- Multiple potential sources for MRSA bacteremia, including epidural abscess associated with T2/T3, with multiple potential septic joints including the bilateral knees, potentially representing down feel and consequences of patient's MRSA bacteremia.   ?-Of note, TEE demonstrated no evidence of bacterial endocarditis.  Infectious disease consulted, and assessed the patient at [facility] and also following the patient at [facility]. ?-Repeat blood cultures x2 (09/27/2021) positive. ?-ID following and due to ongoing positive blood cultures, ongoing fevers IV antibiotics have been changed from IV vancomycin to IV daptomycin and IV teflaro. ?-Repeat blood cultures ordered and pending. ?-Patient seen in consultation by neurosurgery, Dr. 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 ?-MRI of the L-spine ordered.. ?-At this time neurosurgery does not feel or recommend surgical evacuation of epidural abscess at this time and recommending continued IV antibiotics. ?-Patient seen by orthopedics who recommended continued IV antibiotics at this time with no surgical intervention. ?  ?  ? ?Tenosynovitis ?  ?#) Tenosynovitis of the bilateral wrists: Identified on MRI at [facility], these images reportedly showing no evidence of overt septic arthritis.  Orthopedic surgery consulted during [facility] course. ?-Orthopedics, reassessed patient at [facility] who  recommending continued IV antibiotics at this time per ID recommendations, no surgical intervention from orthopedics standpoint at this time however if clinical exam worsens could consider aspiration but however deferring for now. ?-Appreciate orthopedics input and recommendations. ? ?Septic joint ([place]) ?#) Bilateral knee joint effusions:  ?-Moderate to large effusions identified on bilateral knees per MRIs performed at [facility], concerning for septic arthritis in bilateral distribution, in the setting of presenting MRSA bacteremia.   ?-Orthopedic surgery consulted during [facility] course, with reported associated recommendation for extensive joint washout, prompting patient to be transferred to [facility] for updated orthopedic surgery consultation and extensive joint washout. ?-Patient reassessed by orthopedics, Dr. Doreatha Martin at [facility] and at this point in time no surgical intervention from orthopedics is recommended, continued IV antibiotics per ID and if clinical exam changes or worsens may consider aspiration however will defer that for now.  Orthopedics. ?-IV vancomycin has been changed to IV daptomycin and IV teflaro per ID recommendations due to ongoing positive blood cultures and fevers. ?-ID following. ? ?Epidural abscess ?  ?#) Epidural abscess of the thoracic spine: MRI of the thoracic spine performed during [facility] hospital course suggestive of epidural abscess at T2/T3, without associated acute focal neurologic deficits.  Potentially representing source of the patient's MRSA bacteremia with subsequent seeding of multiple peripheral joints, as above. ?-Continue IV vancomycin. ?-Continue IV pain management. ?-Patient seen in consultation by neurosurgery, Dr. Saintclair Halsted who has reviewed the films and patient noted with a dorsal epidural abscess with no evidence of cord compression however visualization noted to stop at the lower thoracic spine and recommending MRI of the C-spine for further evaluation. ?-MRI of  the C-spine with dorsal epidural abscess throughout the cervical spine contributing  to moderate spinal stenosis.  No cord signal abnormality. ?-Per neurosurgery no surgical intervention at this time and recommendin

## 2021-09-29 ENCOUNTER — Inpatient Hospital Stay (HOSPITAL_COMMUNITY): Payer: 59

## 2021-09-29 DIAGNOSIS — B9562 Methicillin resistant Staphylococcus aureus infection as the cause of diseases classified elsewhere: Secondary | ICD-10-CM | POA: Diagnosis not present

## 2021-09-29 DIAGNOSIS — M7989 Other specified soft tissue disorders: Secondary | ICD-10-CM

## 2021-09-29 DIAGNOSIS — R7881 Bacteremia: Secondary | ICD-10-CM | POA: Diagnosis not present

## 2021-09-29 LAB — CBC WITH DIFFERENTIAL/PLATELET
Abs Immature Granulocytes: 1.35 10*3/uL — ABNORMAL HIGH (ref 0.00–0.07)
Basophils Absolute: 0 10*3/uL (ref 0.0–0.1)
Basophils Relative: 0 %
Eosinophils Absolute: 0 10*3/uL (ref 0.0–0.5)
Eosinophils Relative: 0 %
HCT: 24.5 % — ABNORMAL LOW (ref 36.0–46.0)
Hemoglobin: 8.2 g/dL — ABNORMAL LOW (ref 12.0–15.0)
Immature Granulocytes: 9 %
Lymphocytes Relative: 4 %
Lymphs Abs: 0.6 10*3/uL — ABNORMAL LOW (ref 0.7–4.0)
MCH: 30.9 pg (ref 26.0–34.0)
MCHC: 33.5 g/dL (ref 30.0–36.0)
MCV: 92.5 fL (ref 80.0–100.0)
Monocytes Absolute: 0.5 10*3/uL (ref 0.1–1.0)
Monocytes Relative: 4 %
Neutro Abs: 11.9 10*3/uL — ABNORMAL HIGH (ref 1.7–7.7)
Neutrophils Relative %: 83 %
Platelets: 218 10*3/uL (ref 150–400)
RBC: 2.65 MIL/uL — ABNORMAL LOW (ref 3.87–5.11)
RDW: 14.1 % (ref 11.5–15.5)
WBC: 14.5 10*3/uL — ABNORMAL HIGH (ref 4.0–10.5)
nRBC: 0 % (ref 0.0–0.2)

## 2021-09-29 LAB — CULTURE, BLOOD (ROUTINE X 2): Special Requests: ADEQUATE

## 2021-09-29 LAB — COMPREHENSIVE METABOLIC PANEL
ALT: 28 U/L (ref 0–44)
AST: 34 U/L (ref 15–41)
Albumin: 3 g/dL — ABNORMAL LOW (ref 3.5–5.0)
Alkaline Phosphatase: 63 U/L (ref 38–126)
Anion gap: 9 (ref 5–15)
BUN: 9 mg/dL (ref 6–20)
CO2: 26 mmol/L (ref 22–32)
Calcium: 7.4 mg/dL — ABNORMAL LOW (ref 8.9–10.3)
Chloride: 101 mmol/L (ref 98–111)
Creatinine, Ser: 0.75 mg/dL (ref 0.44–1.00)
GFR, Estimated: >60 mL/min (ref >60)
Glucose, Bld: 100 mg/dL — ABNORMAL HIGH (ref 70–99)
Potassium: 3.3 mmol/L — ABNORMAL LOW (ref 3.5–5.1)
Sodium: 136 mmol/L (ref 135–145)
Total Bilirubin: 1.6 mg/dL — ABNORMAL HIGH (ref 0.3–1.2)
Total Protein: 5.3 g/dL — ABNORMAL LOW (ref 6.5–8.1)

## 2021-09-29 LAB — URINALYSIS, ROUTINE W REFLEX MICROSCOPIC
Bilirubin Urine: NEGATIVE
Glucose, UA: NEGATIVE mg/dL
Hgb urine dipstick: NEGATIVE
Ketones, ur: NEGATIVE mg/dL
Leukocytes,Ua: NEGATIVE
Nitrite: NEGATIVE
Protein, ur: 30 mg/dL — AB
Specific Gravity, Urine: 1.018 (ref 1.005–1.030)
pH: 5 (ref 5.0–8.0)

## 2021-09-29 LAB — TSH: TSH: 0.984 u[IU]/mL (ref 0.350–4.500)

## 2021-09-29 LAB — MAGNESIUM: Magnesium: 2 mg/dL (ref 1.7–2.4)

## 2021-09-29 IMAGING — CT CT FOREARM*L* W/CM
2 of 3 series · 11 of 34 positions shown, 13 images · IV contrast (agent unspecified)
Comparison: MRI left wrist dated [DATE].

CLINICAL DATA: Left forearm infection.  MRSA bacteremia.

EXAM:
CT OF THE UPPER LEFT EXTREMITY WITH CONTRAST
TECHNIQUE: Multidetector CT imaging of the left forearm was performed according
to the standard protocol following intravenous contrast
administration.

[Series 3: left forearm 3.0 mpr ax · axial · 0.22mm/px · z∈[+458,+706]mm · 8 of 105 slices shown, 10 images]
[im 9/105  soft-tissue]
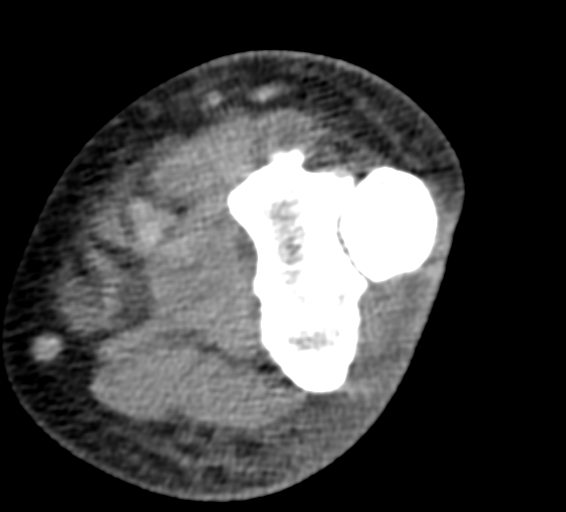
[im 9/105  bone]
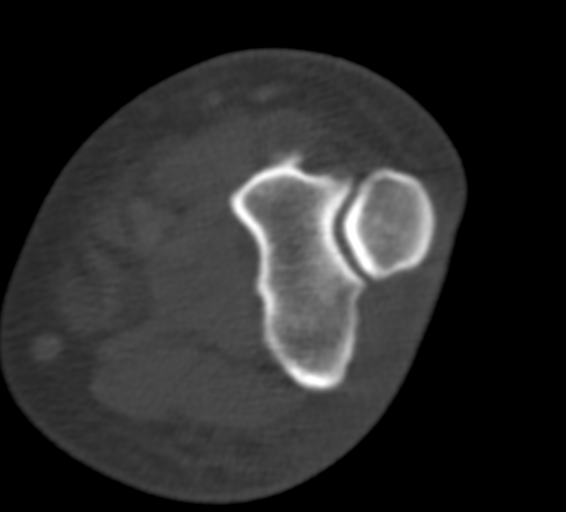
[im 25/105  bone]
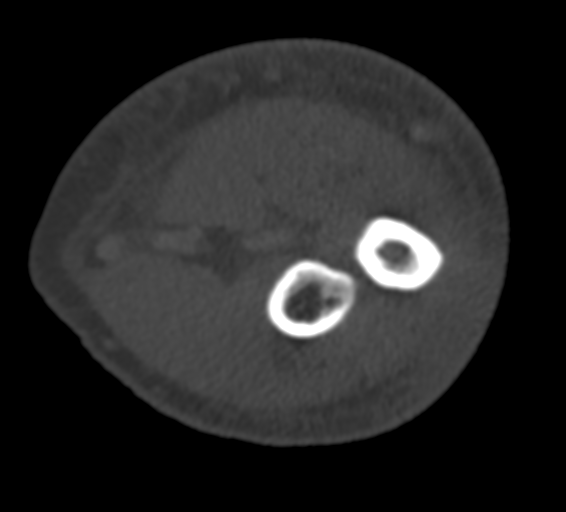
[im 33/105  bone]
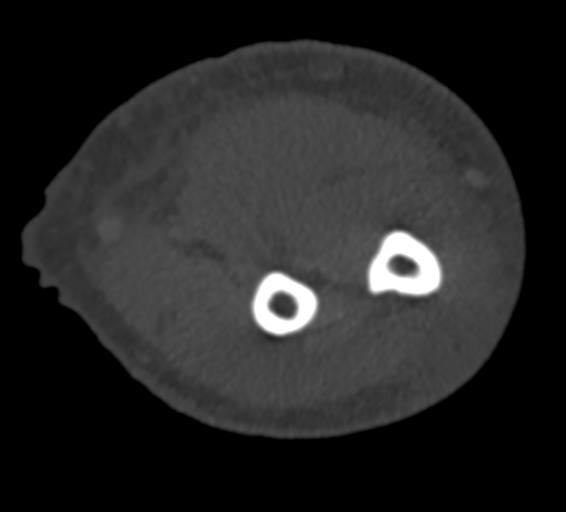
[im 49/105  bone]
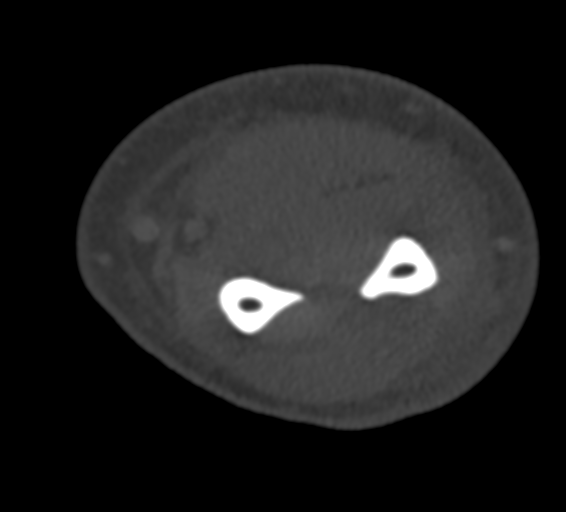
[im 57/105  soft-tissue]
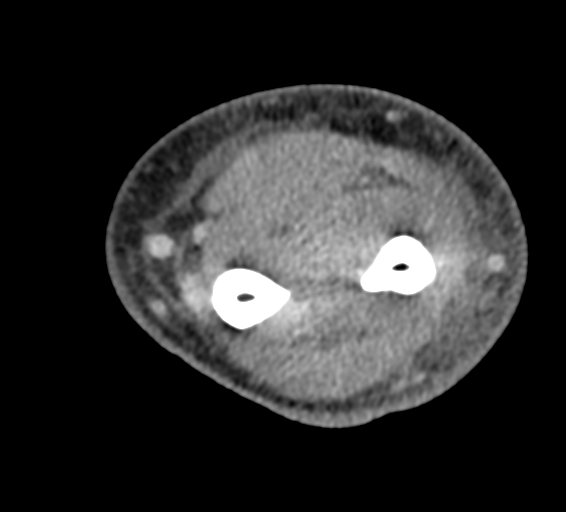
[im 57/105  bone]
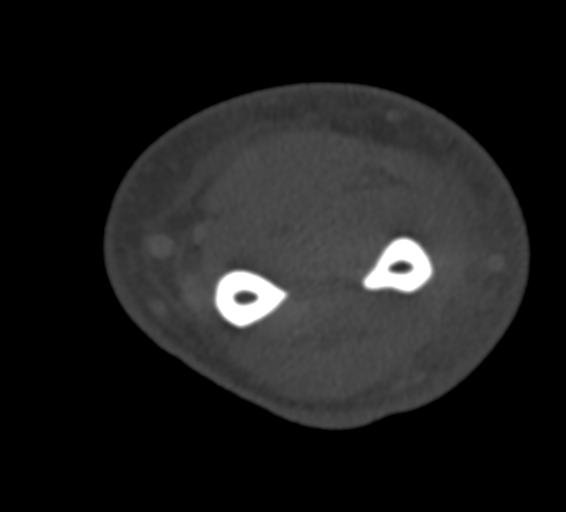
[im 73/105  bone]
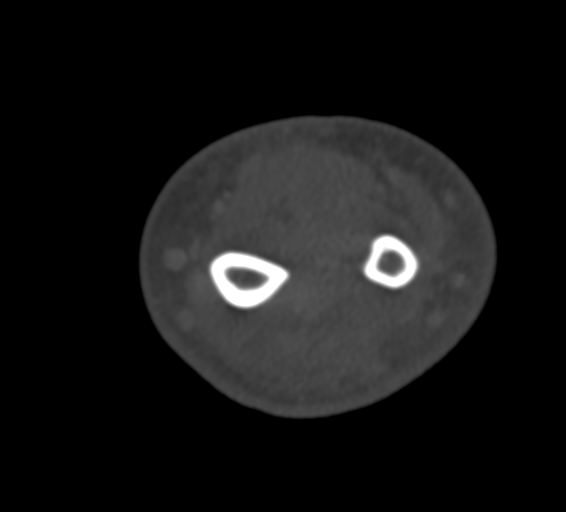
[im 81/105  bone]
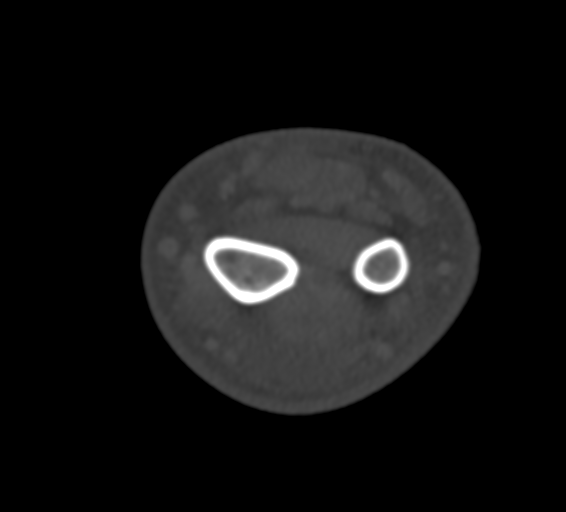
[im 97/105  bone]
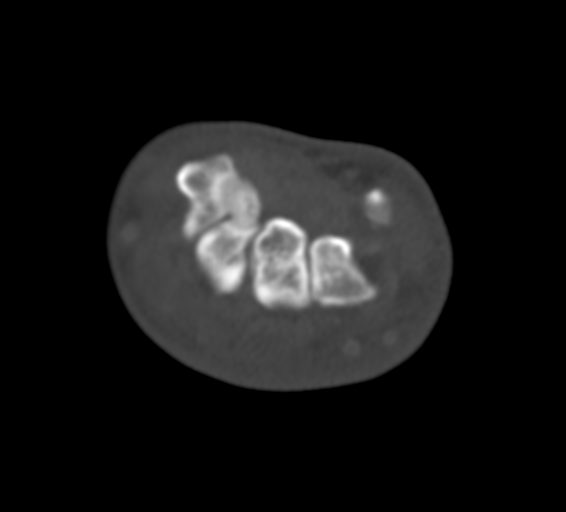

[Series 7: cor st · coronal · 0.26mm/px · 3 of 42 slices shown]
[im 9/42  bone]
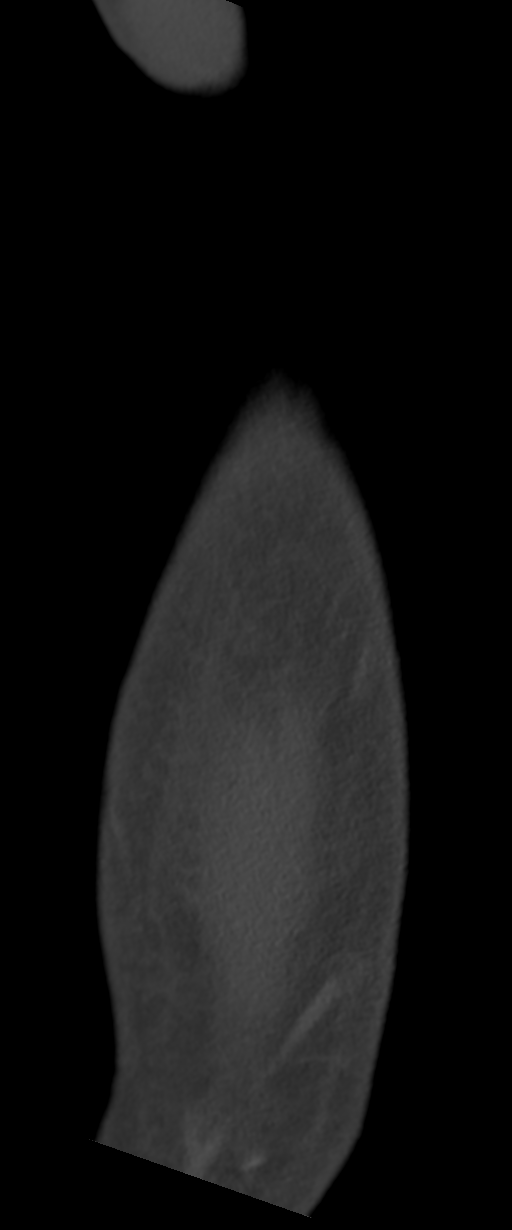
[im 17/42  bone]
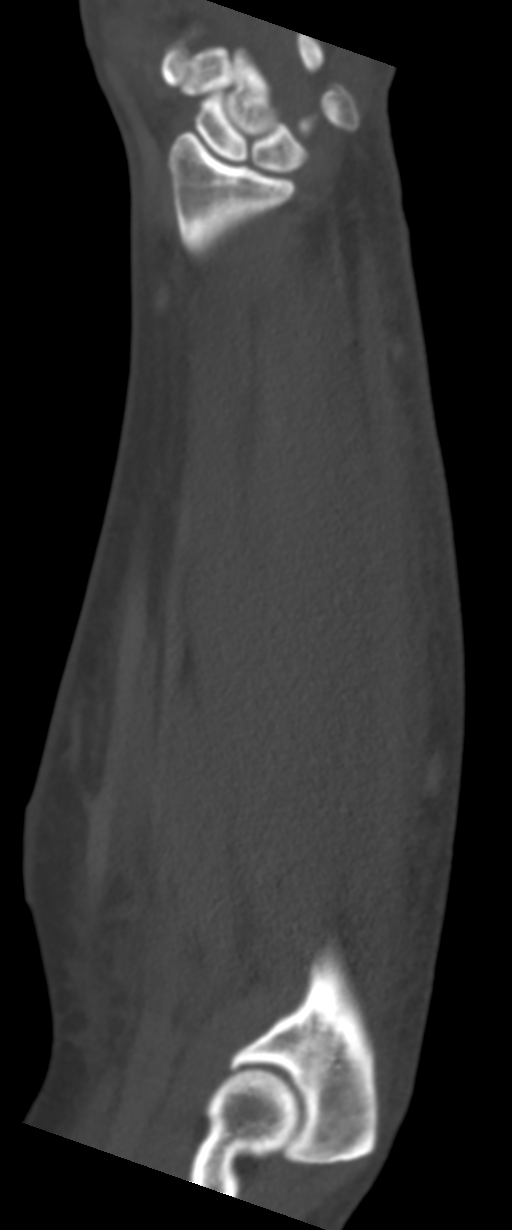
[im 25/42  bone]
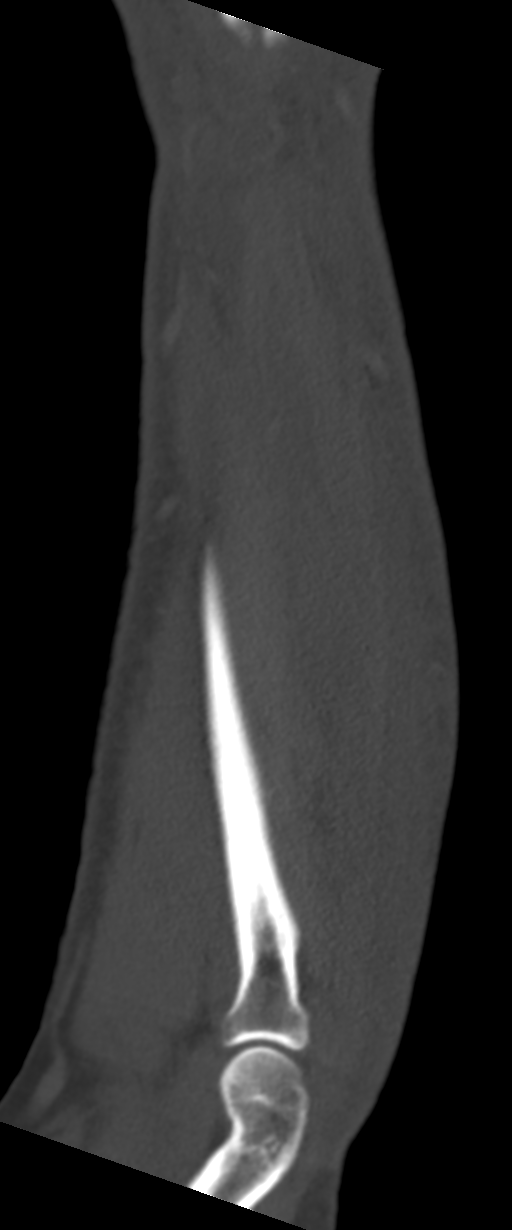

[11 of 34 positions shown; findings below may reference images not displayed]

RADIATION DOSE REDUCTION: This exam was performed according to the
departmental dose-optimization program which includes automated
exposure control, adjustment of the mA and/or kV according to
patient size and/or use of iterative reconstruction technique.

CONTRAST:  120mL OMNIPAQUE IOHEXOL 350 MG/ML SOLN
FINDINGS: Bones/Joint/Cartilage

No fracture or dislocation. Joint spaces are preserved. No joint
effusion.

Ligaments

Ligaments are suboptimally evaluated by CT.

Muscles and Tendons
Prominent fluid within the second, third, and fourth extensor
compartment tendon sheaths is similar to recent MRI. There is
interfascial fluid within the extensor muscle compartment of the
dorsal forearm. No focal intramuscular fluid collection.

Soft tissue
Scattered soft tissue swelling and skin thickening. 2.6 x 0.8 x
cm fluid collection overlying the extensor digitorum tendons in the
distal forearm, likely not significantly changed allowing for
differences in technique.
IMPRESSION: 1. Unchanged severe tenosynovitis of the second, third, and fourth
extensor compartment tendon sheaths.
2. Interfascial fluid within the extensor muscle compartment of the
dorsal forearm concerning for infectious fasciitis. No focal
intramuscular fluid collection.
3. 2.6 x 0.8 x 4.2 cm abscess overlying the extensor digitorum
tendons in the distal forearm, likely not significantly changed
allowing for differences in technique.

## 2021-09-29 IMAGING — CT CT FEMUR *R* W/ CM
2 of 4 series · 11 of 34 positions shown, 13 images · IV contrast (agent unspecified)
Comparison: MRI right femur dated [DATE].

CLINICAL DATA: Right thigh infection.

EXAM:
CT OF THE LOWER RIGHT EXTREMITY WITH CONTRAST
TECHNIQUE: Multidetector CT imaging of the lower right extremity was performed
according to the standard protocol following intravenous contrast
administration.

[Series 5: right femur 3.0 mpr ax · axial · 0.44mm/px · z∈[-770,-309]mm · 8 of 179 slices shown, 10 images]
[im 14/179  soft-tissue]
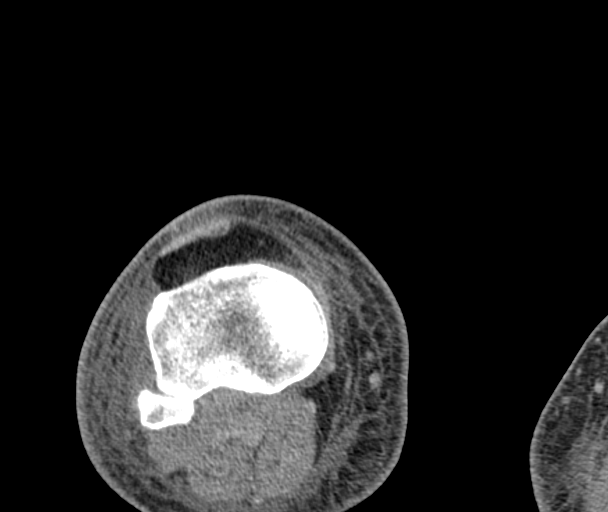
[im 14/179  bone]
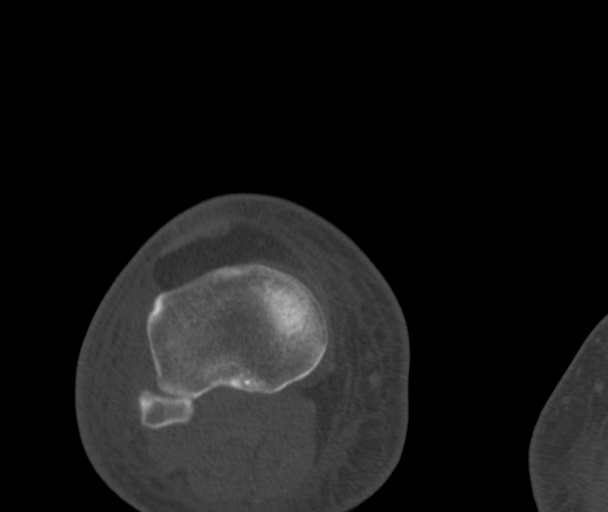
[im 42/179  bone]
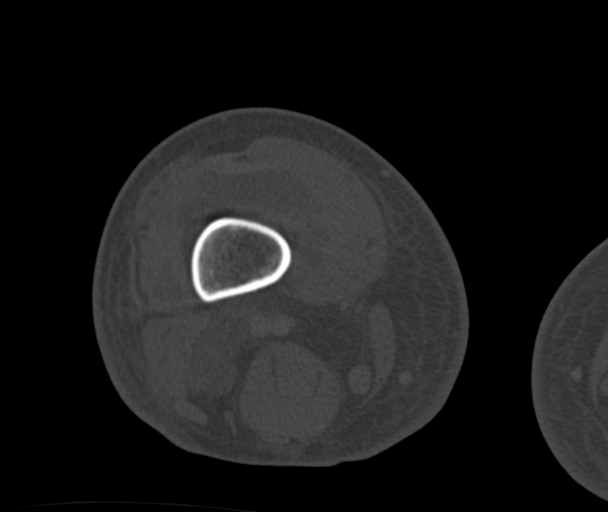
[im 55/179  bone]
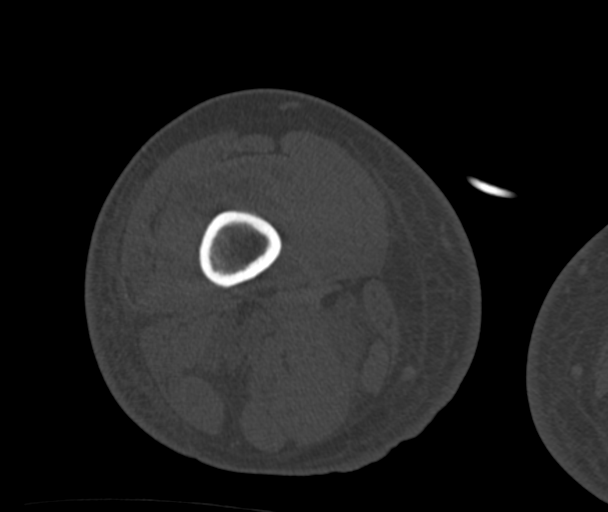
[im 83/179  bone]
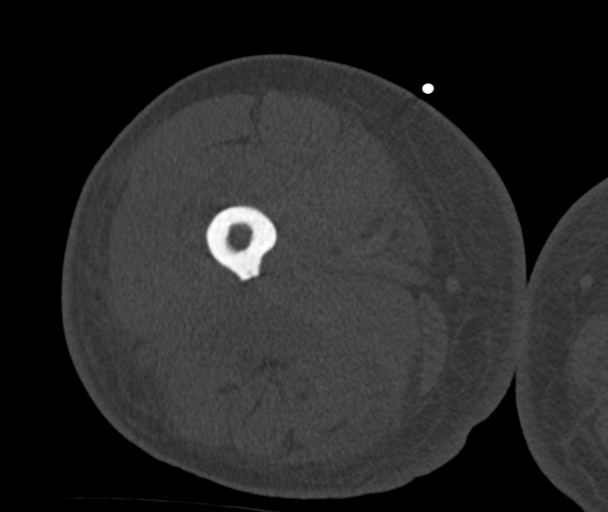
[im 96/179  soft-tissue]
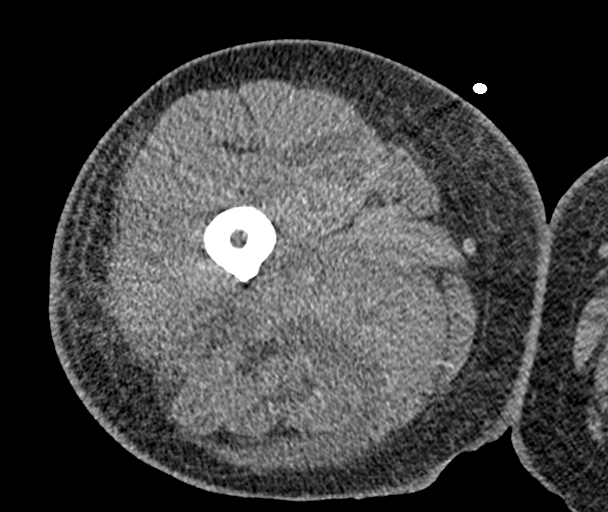
[im 96/179  bone]
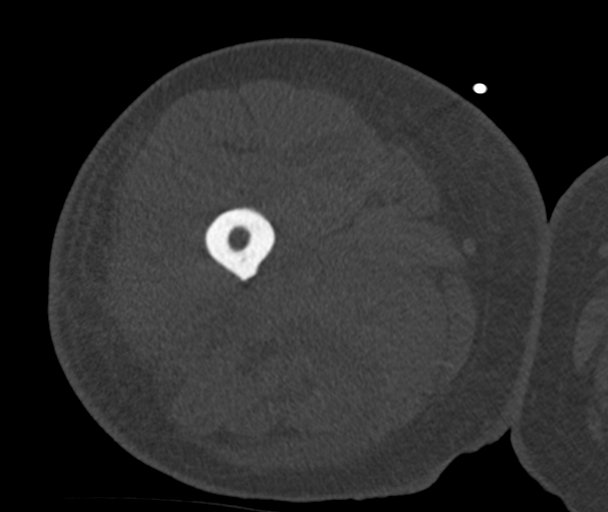
[im 124/179  bone]
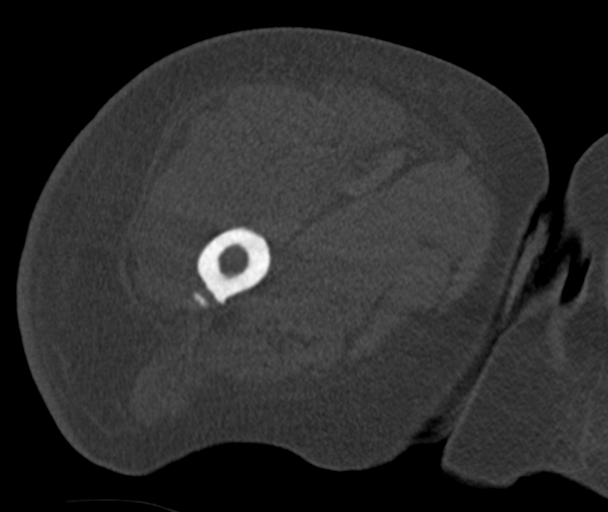
[im 137/179  bone]
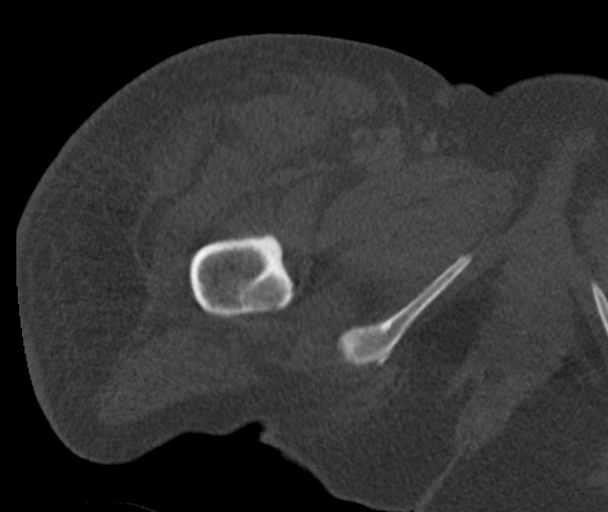
[im 165/179  bone]
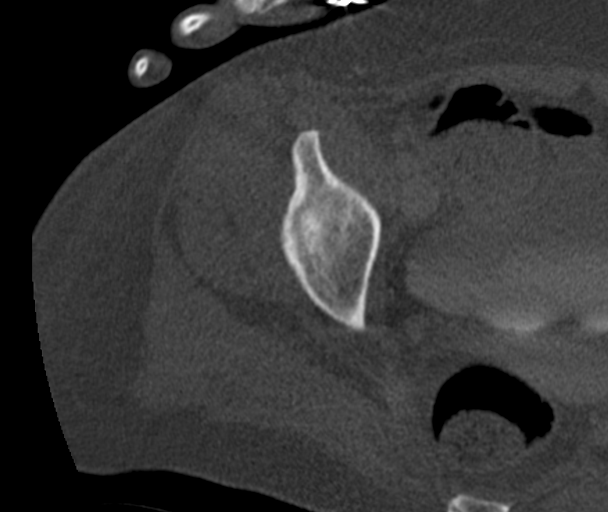

[Series 8: coronalsoft tissue · coronal · 0.47mm/px · 3 of 122 slices shown]
[im 25/122  bone]
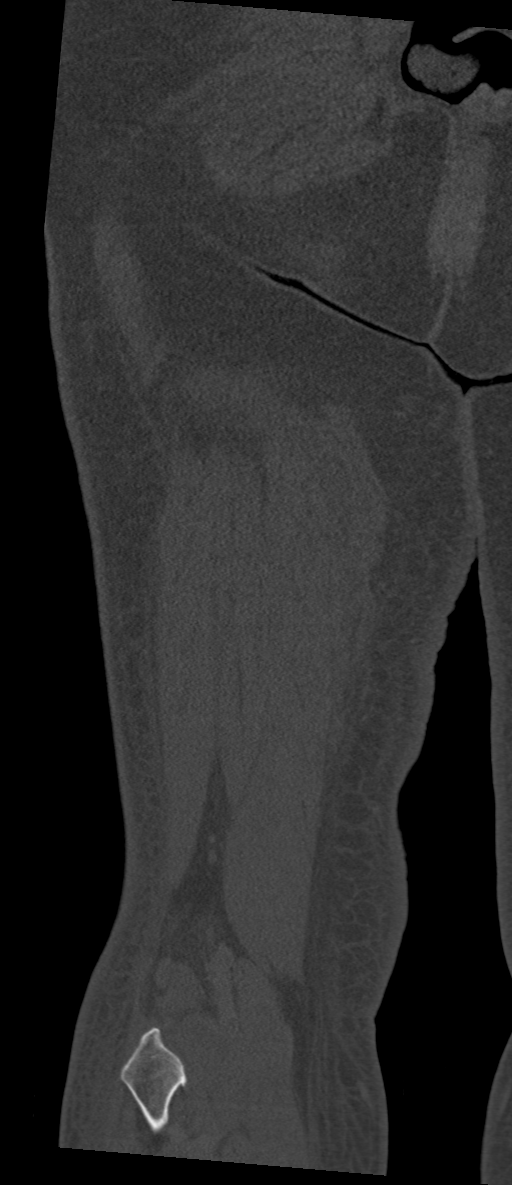
[im 49/122  bone]
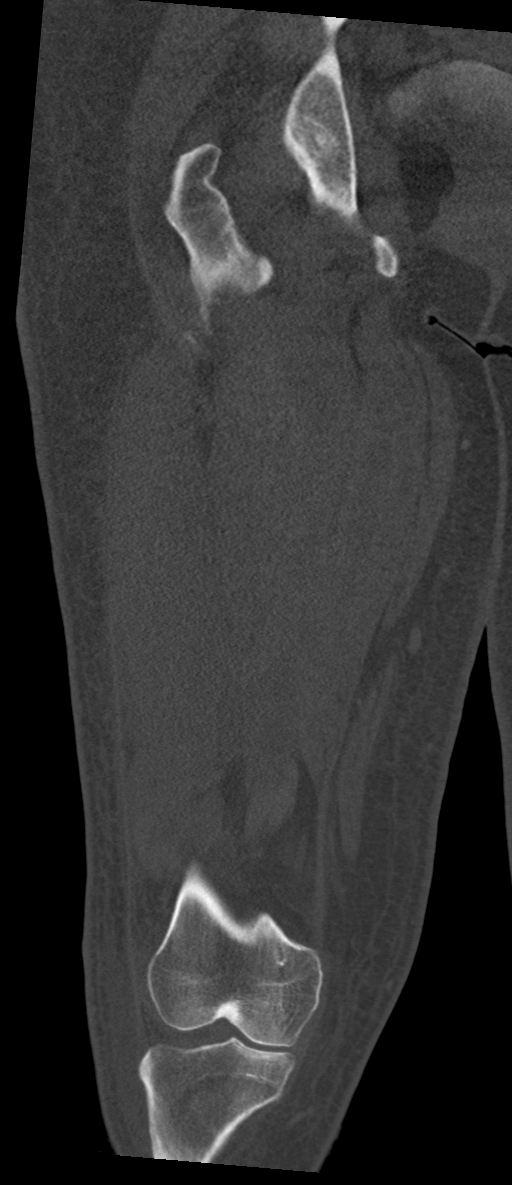
[im 73/122  bone]
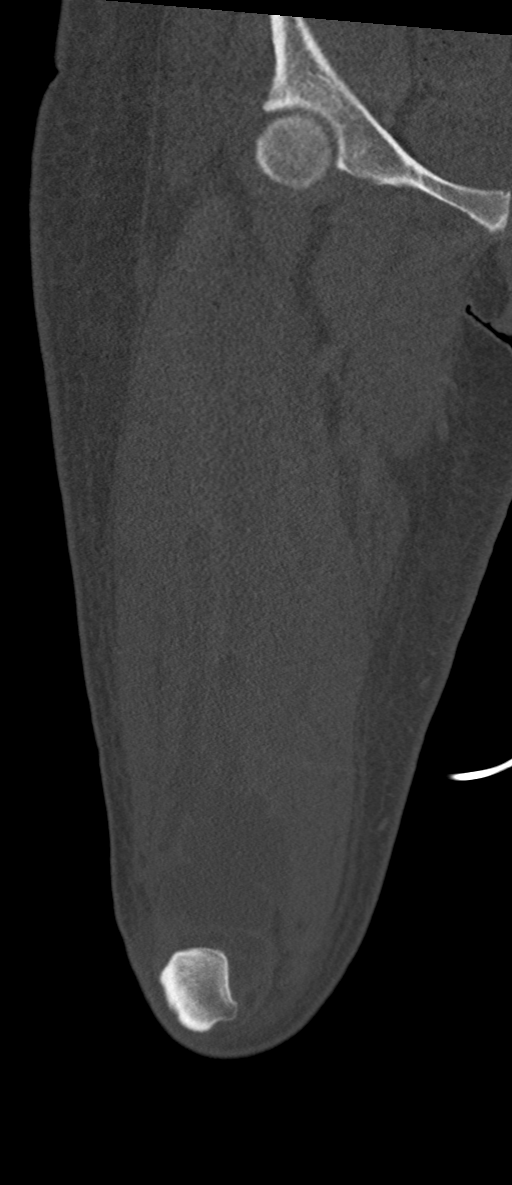

[11 of 34 positions shown; findings below may reference images not displayed]

RADIATION DOSE REDUCTION: This exam was performed according to the
departmental dose-optimization program which includes automated
exposure control, adjustment of the mA and/or kV according to
patient size and/or use of iterative reconstruction technique.

CONTRAST:  120mL OMNIPAQUE IOHEXOL 350 MG/ML SOLN
FINDINGS: Bones/Joint/Cartilage

No fracture or dislocation. Joint spaces are preserved. Unchanged
large right knee joint effusion with thick synovial enhancement.

Ligaments

Ligaments are suboptimally evaluated by CT.

Muscles and Tendons
Multiple small intramuscular fluid collections within the vastus
intermedius, vastus lateralis, and adductor magnus muscles are not
significantly changed. Similar interfascial fluid collection between
the vastus intermedius and rectus femoris muscles. Similar to
slightly enlarged interfascial fluid collection between the
hamstring muscles and adductor magnus muscle.

Soft tissue
Unchanged soft tissue swelling and skin thickening of the right
thigh. No subcutaneous emphysema.
IMPRESSION: 1. Unchanged large right knee joint effusion with thick synovial
enhancement, concerning for septic arthritis.
2. Allowing for differences in technique, similar extensive
infectious myofasciitis involving all compartments of the right
thigh.

## 2021-09-29 MED ORDER — SODIUM CHLORIDE 0.9 % IV SOLN
510.0000 mg | Freq: Once | INTRAVENOUS | Status: AC
  Administered 2021-09-29: 510 mg via INTRAVENOUS
  Filled 2021-09-29: qty 17

## 2021-09-29 MED ORDER — IOHEXOL 350 MG/ML SOLN
120.0000 mL | Freq: Once | INTRAVENOUS | Status: AC | PRN
  Administered 2021-09-29: 120 mL via INTRAVENOUS

## 2021-09-29 MED ORDER — ENOXAPARIN SODIUM 40 MG/0.4ML IJ SOSY
40.0000 mg | PREFILLED_SYRINGE | INTRAMUSCULAR | Status: DC
  Administered 2021-09-29 – 2021-10-06 (×6): 40 mg via SUBCUTANEOUS
  Filled 2021-09-29 (×6): qty 0.4

## 2021-09-29 MED ORDER — FUROSEMIDE 10 MG/ML IJ SOLN
40.0000 mg | Freq: Two times a day (BID) | INTRAMUSCULAR | Status: DC
  Administered 2021-09-29 – 2021-10-06 (×13): 40 mg via INTRAVENOUS
  Filled 2021-09-29 (×13): qty 4

## 2021-09-29 MED ORDER — POTASSIUM CHLORIDE CRYS ER 20 MEQ PO TBCR
40.0000 meq | EXTENDED_RELEASE_TABLET | Freq: Once | ORAL | Status: AC
Start: 2021-09-29 — End: 2021-09-29
  Administered 2021-09-29: 40 meq via ORAL
  Filled 2021-09-29: qty 2

## 2021-09-29 MED ORDER — FUROSEMIDE 10 MG/ML IJ SOLN
40.0000 mg | Freq: Two times a day (BID) | INTRAMUSCULAR | Status: DC
Start: 2021-09-29 — End: 2021-09-29

## 2021-09-29 MED ORDER — FERROUS SULFATE 325 (65 FE) MG PO TABS
325.0000 mg | ORAL_TABLET | Freq: Every day | ORAL | Status: DC
  Administered 2021-09-29 – 2021-10-06 (×8): 325 mg via ORAL
  Filled 2021-09-29 (×9): qty 1

## 2021-09-29 NOTE — Progress Notes (Signed)
Patient's overall demeanor seemed to improve slightly over the night. At the beginning of the shift, patient requiring more pain medication and not wanting to move in the bed because of pain. Around 0200, it was noted that patient was sitting up on the side of the bed and dangling her feet while talking to husband. This RN encouraged patient to drink fluids and keep moving her joints. Patient stated that she felt a little better than she has. Patient taken to the [facility] around 0600 and had some acute pain from standing and required some IV PRN dilaudid at that time.  ?

## 2021-09-29 NOTE — Progress Notes (Signed)
Patient ID: Jill Cox, female   DOB: 1975/10/25, 46 y.o.   MRN: [id number] ? ? LOS: 3 days  ? ?Subjective: ?Feels about the same though family has noticed improvement. Does admit that back pain is nearly resolved. ? ? ?Objective: ?Vital signs in last 24 hours: ?Temp:  [98.2 ?F (36.8 ?C)-101.2 ?F (38.4 ?C)] 101.1 ?F (38.4 ?C) (03/17 [id number]) ?Pulse Rate:  [87-104] 97 (03/17 0718) ?Resp:  [15-25] 18 (03/17 0358) ?BP: (96-140)/(53-77) 121/62 (03/17 [id number]) ?SpO2:  [93 %-98 %] 95 % (03/17 0718) ?Last BM Date : 09/23/21 ? ? ?Laboratory  ?CBC ?Recent Labs  ?  09/28/21 ?0018 09/29/21 ?[id number]  ?WBC 14.9* 14.5*  ?HGB 9.0* 8.2*  ?HCT 27.4* 24.5*  ?PLT 199 218  ? ?BMET ?Recent Labs  ?  09/28/21 ?0018 09/29/21 ?[id number]  ?NA 133* 136  ?K 3.4* 3.3*  ?CL 99 101  ?CO2 25 26  ?GLUCOSE 152* 100*  ?BUN 10 9  ?CREATININE 0.66 0.75  ?CALCIUM 7.3* 7.4*  ? ? ? ?Physical Exam ?General appearance: alert and no distress ?LUE: Edema distal FA unchanged, wrist motion minimal pain, mild TTP ~5cm prox to wrist dorsal aspect. ?RLE: Edema unchanged. No joint pain with knee ROM. Still most tender distal ant thigh though some increased pain lateral thigh extending to the popliteum. ? ? ?Assessment/Plan: ?Diffuse myositis -- Still no e/o septic arthritis. Continue abx as directed. Will check CT FA/femur to make sure no abscesses have coalesced. Will continue to monitor. ? ? ? ?Lisette Abu, PA-C ?Orthopedic Surgery ?[phone] ?09/29/2021 ?

## 2021-09-29 NOTE — Progress Notes (Signed)
Left upper extremity venous duplex and bilateral lower extremity venous duplex has been completed. ?Preliminary results can be found in CV Proc through chart review.  ? ?09/29/21 2:19 PM ?Olen Cordial RVT   ?

## 2021-09-29 NOTE — Progress Notes (Signed)
?   ? ? ? ? ?Jill Cox for Infectious Disease ? ?Date of Admission:  09/26/2021    ? ? ?Lines: ?Peripheral iv's ? ?Abx: ?3/16-c daptomycin/ceftaroline ? ?3/12-3/15 vancomycin ? ? ?ASSESSMENT: ?46 yo female admitted in transfer from [place] on 3/14 for severe metastatic mrsa sepsis ? ?No obvious risk factor for mrsa bsi ? ?She developed sx of polyarthralgia/bodyache and malaise 1-2 weeks prior to admission ? ?Initial evaluation at [facility] positive for bcx 3/11 (persistent daily until 3/13), all mri (thoracic/cervical spine), knees, wrists, femurs, right ankle are positive for soft tissue pyogenic focus and the spine mri series suggest posterior epidural abscess from cervical to upper thoracic area. L-spine mri shows potential C2 involvement as well  ? ?3/17 assessment ?Neurosurgery and orthopedic surgery had evaluated her. At this time 09/28/2021 there is no indication for surgical intervention ?Her 3/15 blood cx is still positive (since 3/11 plus 2 weeks of symptoms at home). As no surgical source control is possible at this time, changed abx to dapto/ceftaryline in hope of more early clearance of bacteremia ? ?3/16 repeat bcx (after dapto/ceftaroline) so far ngtd. 3/17 repeat bcx in progress ? ?Has significant swelling/tenderness left wrist. Right calf redness. Ortho following. No neurologic deficit in LE ? ?PLAN: ?Continue dapto/ceftaroline ?F/u repeat blood cx ?Appreciate ortho/nsg input ?Discussed with primary team ? ?Principal Problem: ?  MRSA bacteremia ?Active Problems: ?  Depression ?  Septic joint ([place]) ?  Epidural abscess ?  Tenosynovitis ?  Hypoalbuminemia: Severe ?  Hypokalemia ?  Constipation ?  Anemia ?  Edema due to hypervolemia ? ? ?No Known Allergies ? ?Scheduled Meds: ? enoxaparin (LOVENOX) injection  40 mg Subcutaneous Q24H  ? feeding supplement  237 mL Oral BID BM  ? ferrous sulfate  325 mg Oral Q breakfast  ? furosemide  40 mg Intravenous Q12H  ? polyethylene glycol  17 g Oral Daily  ?  senna-docusate  1 tablet Oral BID  ? sertraline  25 mg Oral q morning  ? ?Continuous Infusions: ? ceFTAROline (TEFLARO) IV 600 mg (09/29/21 1049)  ? DAPTOmycin (CUBICIN)  IV Stopped (09/28/21 1222)  ? ?PRN Meds:.acetaminophen **OR** acetaminophen, cyclobenzaprine, HYDROcodone-acetaminophen, HYDROmorphone (DILAUDID) injection, naLOXone (NARCAN)  injection, ondansetron (ZOFRAN) IV ? ? ?SUBJECTIVE: ?Gettign to bathroom ok ?Stable swelling LE  ?Perhaps worsening redness in right calf ?Stable redness left wrist ?Ongoing fever ?Stable wbc ? ?No n/v/diarrhea ?No LE deficit ?No urinary incontinence ? ?Review of Systems: ?ROS ?All other ROS was negative, except mentioned above ? ? ? ? ?OBJECTIVE: ?Vitals:  ? 09/28/21 2000 09/28/21 2304 09/29/21 0358 09/29/21 [id number]  ?BP: (!) 104/53 96/72 105/61 121/62  ?Pulse: 87 87 94 97  ?Resp: (!) 21 15 18    ?Temp:  98.2 ?F (36.8 ?C) 98.9 ?F (37.2 ?C) (!) 101.1 ?F (38.4 ?C)  ?TempSrc:  Oral Oral Oral  ?SpO2: 95% 93% 93% 95%  ?Weight:      ?Height:      ? ?Body mass index is 29.07 kg/m?. ? ?Physical Exam ?General/constitutional: no distress, pleasant ?HEENT: Normocephalic, PER, Conj Clear, EOMI, Oropharynx clear ?Neck supple ?CV: rrr no mrg ?Lungs: clear to auscultation, normal respiratory effort ?Abd: Soft, Nontender ?Ext/msk/skin. Erythematous change left wrist/right calf. Swellilng left wrist and bilateral LE. Relatively intact active rom knees/ankles. Limited left wrist rom but decent  ?Neuro: nonfocal ? ? ? ? ?Lab Results ?Lab Results  ?Component Value Date  ? WBC 14.5 (H) 09/29/2021  ? HGB 8.2 (L) 09/29/2021  ? HCT 24.5 (L)  09/29/2021  ? MCV 92.5 09/29/2021  ? PLT 218 09/29/2021  ?  ?Lab Results  ?Component Value Date  ? CREATININE 0.75 09/29/2021  ? BUN 9 09/29/2021  ? NA 136 09/29/2021  ? K 3.3 (L) 09/29/2021  ? CL 101 09/29/2021  ? CO2 26 09/29/2021  ?  ?Lab Results  ?Component Value Date  ? ALT 28 09/29/2021  ? AST 34 09/29/2021  ? ALKPHOS 63 09/29/2021  ? BILITOT 1.6 (H)  09/29/2021  ?  ? ? ?Microbiology: ?Recent Results (from the past 240 hour(s))  ?Blood culture (single)     Status: Abnormal  ? Collection Time: 09/23/21  1:53 PM  ? Specimen: BLOOD  ?Result Value Ref Range Status  ? Specimen Description   Final  ?  BLOOD RIGHT ANTECUBITAL ?Performed at [facility], [address] [place], [place] [address] ?  ? Special Requests   Final  ?  BOTTLES DRAWN AEROBIC AND ANAEROBIC Blood Culture adequate volume ?Performed at [facility], [address] [place],  [address] ?  ? Culture  Setup Time   Final  ?  IN BOTH AEROBIC AND ANAEROBIC BOTTLES ?GRAM POSITIVE COCCI ?CRITICAL RESULT CALLED TO, READ BACK BY AND VERIFIED WITH: ?Paulina Fusi [facility] AT [id number] 09/24/2021 GAA ?Performed at [facility], [place] [address], [place],  [address] ?  ? Culture METHICILLIN RESISTANT STAPHYLOCOCCUS AUREUS (A)  Final  ? Report Status 09/26/2021 FINAL  Final  ? Organism ID, Bacteria METHICILLIN RESISTANT STAPHYLOCOCCUS AUREUS  Final  ?    Susceptibility  ? Methicillin resistant staphylococcus aureus - MIC*  ?  CIPROFLOXACIN <=0.5 SENSITIVE Sensitive   ?  ERYTHROMYCIN <=0.25 SENSITIVE Sensitive   ?  GENTAMICIN <=0.5 SENSITIVE Sensitive   ?  OXACILLIN >=4 RESISTANT Resistant   ?  TETRACYCLINE <=1 SENSITIVE Sensitive   ?  VANCOMYCIN 1 SENSITIVE Sensitive   ?  TRIMETH/SULFA <=10 SENSITIVE Sensitive   ?  CLINDAMYCIN <=0.25 SENSITIVE Sensitive   ?  RIFAMPIN <=0.5 SENSITIVE Sensitive   ?  Inducible Clindamycin NEGATIVE Sensitive   ?  * METHICILLIN RESISTANT STAPHYLOCOCCUS AUREUS  ?Blood Culture ID Panel (Reflexed)     Status: Abnormal  ? Collection Time: 09/23/21  1:53 PM  ?Result Value Ref Range Status  ? Enterococcus faecalis NOT DETECTED NOT DETECTED Final  ? Enterococcus Faecium NOT DETECTED NOT DETECTED Final  ? Listeria monocytogenes NOT DETECTED NOT DETECTED Final  ? Staphylococcus species DETECTED (A) NOT DETECTED Final  ?  Comment: CRITICAL RESULT CALLED TO, READ  BACK BY AND VERIFIED WITH: ?Paulina Fusi PHARMD AT 0522 09/24/2021 GAA ?  ? Staphylococcus aureus (BCID) DETECTED (A) NOT DETECTED Final  ?  Comment: Methicillin (oxacillin)-resistant Staphylococcus aureus (MRSA). MRSA is predictably resistant to beta-lactam antibiotics (except ceftaroline). Preferred therapy is vancomycin unless clinically contraindicated. Patient requires contact precautions if  ?hospitalized. ?CRITICAL RESULT CALLED TO, READ BACK BY AND VERIFIED WITH: ?Paulina Fusi PHARMD AT [id number] 09/24/2021 GAA ?  ? Staphylococcus epidermidis NOT DETECTED NOT DETECTED Final  ? Staphylococcus lugdunensis NOT DETECTED NOT DETECTED Final  ? Streptococcus species NOT DETECTED NOT DETECTED Final  ? Streptococcus agalactiae NOT DETECTED NOT DETECTED Final  ? Streptococcus pneumoniae NOT DETECTED NOT DETECTED Final  ? Streptococcus pyogenes NOT DETECTED NOT DETECTED Final  ? A.calcoaceticus-baumannii NOT DETECTED NOT DETECTED Final  ? Bacteroides fragilis NOT DETECTED NOT DETECTED Final  ? Enterobacterales NOT DETECTED NOT DETECTED Final  ? Enterobacter cloacae complex NOT DETECTED NOT DETECTED Final  ?  Escherichia coli NOT DETECTED NOT DETECTED Final  ? Klebsiella aerogenes NOT DETECTED NOT DETECTED Final  ? Klebsiella oxytoca NOT DETECTED NOT DETECTED Final  ? Klebsiella pneumoniae NOT DETECTED NOT DETECTED Final  ? Proteus species NOT DETECTED NOT DETECTED Final  ? Salmonella species NOT DETECTED NOT DETECTED Final  ? Serratia marcescens NOT DETECTED NOT DETECTED Final  ? Haemophilus influenzae NOT DETECTED NOT DETECTED Final  ? Neisseria meningitidis NOT DETECTED NOT DETECTED Final  ? Pseudomonas aeruginosa NOT DETECTED NOT DETECTED Final  ? Stenotrophomonas maltophilia NOT DETECTED NOT DETECTED Final  ? Candida albicans NOT DETECTED NOT DETECTED Final  ? Candida auris NOT DETECTED NOT DETECTED Final  ? Candida glabrata NOT DETECTED NOT DETECTED Final  ? Candida krusei NOT DETECTED NOT DETECTED Final  ? Candida  parapsilosis NOT DETECTED NOT DETECTED Final  ? Candida tropicalis NOT DETECTED NOT DETECTED Final  ? Cryptococcus neoformans/gattii NOT DETECTED NOT DETECTED Final  ? Meth resistant mecA/C and MREJ DETECTED (A) NOT DETECTED Final

## 2021-09-29 NOTE — Progress Notes (Signed)
Husband asks MD to sign his wife FMLA form. Explained to husband that MD said he is willing to help but only evaluated her once and needs to see how her hospital course goes then he will fill it out. Husband wants MD to do it or he will have other doctor do it. Notified MD what husband said. MD called husband multiple times no response. MD called nurse and asked nurse to have husband talk to him on nurse's phone. Nurse to room and pt's husband left. MD called back and was able to talk to pt's husband. FMLA form in chart and RN on Monday will have provider do it. ?

## 2021-09-29 NOTE — Progress Notes (Signed)
ANTICOAGULATION CONSULT NOTE - Initial Consult ? ?Pharmacy Consult for enoxaparin ?Indication: VTE prophylaxis ? ?No Known Allergies ? ?Patient Measurements: ?Height: 5\' 6"  (167.6 cm) ?Weight: 81.7 kg (180 lb 1.9 oz) ?IBW/kg (Calculated) : 59.3 ? ? ?Vital Signs: ?Temp: 101.1 ?F (38.4 ?C) (03/17 10-08-1981) ?Temp Source: Oral (03/17 10-08-1981) ?BP: 121/62 (03/17 10-08-1981) ?Pulse Rate: 97 (03/17 0718) ? ?Labs: ?Recent Labs  ?  09/27/21 ?09/29/21 09/28/21 ?0018 09/29/21 ?10/01/21  ?HGB 10.3* 9.0* 8.2*  ?HCT 31.2* 27.4* 24.5*  ?PLT 205 199 218  ?CREATININE 0.67 0.66 0.75  ? ? ?Estimated Creatinine Clearance: 93.7 mL/min (by C-G formula based on SCr of 0.75 mg/dL). ? ? ?Medical History: ?Past Medical History:  ?Diagnosis Date  ? Depression   ? Engages in vaping   ? ? ?Assessment: ?46 yo female with MRSA bacteremia and epidural abscess. MD wishes to start lovenox for VTE prophylaxis. Currently pending venous dopplers to r/o VTE. Hgb 8.2 and stable. No overt bleeding reported.  ? ?Goals:  ?Prevention of VTE ?Monitor platelets by anticoagulation protocol: Yes ?  ?Plan:  ?Lovenox 40mg  SQ q24h pending dopplers.  ?Pharmacy will sign off, but follow peripherally.  ? ?Siddarth Hsiung A. 55, PharmD, BCPS, FNKF ?Clinical Pharmacist ?[facility] ?Please utilize Amion for appropriate phone number to reach the unit pharmacist [facility]) ? ?09/29/2021,12:08 PM ? ? ?

## 2021-09-29 NOTE — Progress Notes (Signed)
Subjective: ?Patient reports doing better today still with swollen left arm and legs causing discomfort but neurologically stable  ? ?Objective: ?Vital signs in last 24 hours: ?Temp:  [98.2 ?F (36.8 ?C)-101.2 ?F (38.4 ?C)] 101.1 ?F (38.4 ?C) (03/17 [id number]) ?Pulse Rate:  [87-104] 97 (03/17 0718) ?Resp:  [15-25] 18 (03/17 0358) ?BP: (96-140)/(53-77) 121/62 (03/17 [id number]) ?SpO2:  [93 %-98 %] 95 % (03/17 0718) ? ?Intake/Output from previous day: ?03/16 0701 - 03/17 0700 ?In: 665.5 [IV [id number] ?Out: 3075 [Urine:3075] ?Intake/Output this shift: ?No intake/output data recorded. ? ?Strength 5 of 5 upper and lower extremities ? ?Lab Results: ?Recent Labs  ?  09/28/21 ?0018 09/29/21 ?[id number]  ?WBC 14.9* 14.5*  ?HGB 9.0* 8.2*  ?HCT 27.4* 24.5*  ?PLT 199 218  ? ?BMET ?Recent Labs  ?  09/28/21 ?0018 09/29/21 ?[id number]  ?NA 133* 136  ?K 3.4* 3.3*  ?CL 99 101  ?CO2 25 26  ?GLUCOSE 152* 100*  ?BUN 10 9  ?CREATININE 0.66 0.75  ?CALCIUM 7.3* 7.4*  ? ? ?Studies/Results: ?MR LUMBAR SPINE WO CONTRAST ? ?Result Date: 09/28/2021 ?CLINICAL DATA:  Epidural abscess EXAM: MRI LUMBAR SPINE WITHOUT CONTRAST TECHNIQUE: Multiplanar, multisequence MR imaging of the lumbar spine was performed. No intravenous contrast was administered. COMPARISON:  None. FINDINGS: Segmentation:  Standard. Alignment:  Physiologic. Vertebrae: No acute fracture. Increased T2 signal in the L4-L5 disc, with mildly increased T2 signal in the L5 vertebral body and at the inferior L4 endplate. No suspicious osseous lesion. Conus medullaris and cauda equina: Conus extends to the L1-L2 level. Small T1 heterogeneous and T2 hyperintense collection at the left aspect of the thecal sac at L5, measuring approximately 10 x 11 x 23 mm (series 8, image 36 and series 5, image 10), which indents upon the left aspect of the thecal sac and likely compresses the descending L5 nerve roots. Conus and cauda equina appear otherwise normal. Paraspinal and other soft tissues: Negative. Disc  levels: T12-L1: No significant disc bulge. No spinal canal stenosis or neural foraminal narrowing. L1-L2: No significant disc bulge. Mild facet arthropathy. No spinal canal stenosis or neural foraminal narrowing. L2-L3: No significant disc bulge. Mild facet arthropathy. No spinal canal stenosis or neural foraminal narrowing. L3-L4: No significant disc bulge. Mild facet arthropathy. No spinal canal stenosis or neural foraminal narrowing. L4-L5: Disc height loss and mild disc bulge. Moderate facet arthropathy. No spinal canal stenosis or neural foraminal narrowing. L5-S1: Left eccentric disc bulge. Moderate facet arthropathy. No spinal canal stenosis. Mild left neural foraminal narrowing. IMPRESSION: Evaluation is somewhat limited in the absence of intravenous contrast. Posterior to the L5 vertebral body, there is a T2 hyperintense collection that could represent early infection and left lateral recess phlegmon, but could also represent benign extruded disc material. In addition, there is mildly increased T2 signal at the inferior aspect of L4 and in the L5 vertebral body, with increased T2 signal in the L4-L5 intervertebral disc, which are nonspecific but could represent discitis osteomyelitis. Consider repeat L-spine MRI with and without contrast in 10-14 days. Electronically Signed   By: Merilyn Baba M.D.   On: 09/28/2021 04:09  ? ?MR CERVICAL SPINE W WO CONTRAST ? ?Result Date: 09/27/2021 ?CLINICAL DATA:  Epidural abscess. EXAM: MRI CERVICAL SPINE WITHOUT AND WITH CONTRAST TECHNIQUE: Multiplanar and multiecho pulse sequences of the cervical spine, to include the craniocervical junction and cervicothoracic junction, were obtained without and with intravenous contrast. CONTRAST:  40mL GADAVIST GADOBUTROL 1 MMOL/ML IV SOLN COMPARISON:  Thoracic spine MRI 09/26/2021 FINDINGS:  Alignment: Straightening of the normal cervical lordosis. No listhesis. Vertebrae: No fracture, suspicious marrow lesion, evidence of discitis,  or significant marrow edema in the cervical spine. Right facet joint effusion at T2-3 as noted on the recent thoracic spine MRI. Cord: The dorsal epidural fluid collection in the upper thoracic spine shown on the recent thoracic spine MRI extends throughout the cervical spine, terminating at the C1-2 level. The collection measures up to 5 mm in AP thickness and demonstrates diffuse peripheral enhancement. There is mild mass effect on the cervical spinal cord without cord edema. Posterior Fossa, vertebral arteries, paraspinal tissues: No paraspinal fluid collection in the cervical spine. Partially visualized soft tissue inflammation in the upper thoracic spine. Disc levels: Moderate spinal stenosis at C4-5, C5-6, and C6-7 due to the dorsal epidural collection and mild disc bulging. Mild spinal stenosis at C3-4 due to the epidural collection. Mild multilevel neural foraminal stenosis, most notable on the right at C5-6 due to uncovertebral spurring. 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 No cord signal abnormality. Electronically Signed   By: Logan Bores M.D.   On: 09/27/2021 11:30   ? ?Assessment/Plan: ?Patient seems to be doing well remains asymptomatic from a neurosurgical perspective with no numbness tingling or weakness in her arms or legs.  Certainly significant swelling in her joints and as being managed and followed by orthopedics.  Continue antibiotics per ID.  No new neurosurgical recommendations ? LOS: 3 days  ? ? ? ?Jill Cox ?09/29/2021, 10:26 AM ? ? ? ? ?

## 2021-09-29 NOTE — Progress Notes (Signed)
?PROGRESS NOTE ? ?Jill Cox  [id number] DOB: 04-13-1976 DOA: 09/26/2021 ?PCP: Bubba Camp, [place]  ? ?Brief Narrative: ?Jill Cox is a 46 y.o. female with medical history significant for depression who is admitted to [facility] on 09/26/2021 by way of transfer from [facility] with MRSA bacteremia in the setting of epidural abscess of the thoracic spine, multiple suspected septic joints, after presenting from home to [facility] ED on 09/24/2021 complaining of polyarthralgias.  ?Patient began experiencing polyarthralgias with distribution that included bilateral wrist, bilateral knees, and right ankle.  Her PCP initiated 3-day course of prednisone, without any ensuing improvement in the symptoms.  Given the ongoing nature of these polyarthralgias, the patient then presented to [facility] ED on 09/23/2021 at which time she was started on Keflex and d/ced, presumably for associated cellulitis.  Blood cultures drawn at the time of the ED visit ultimately grew MRSA, and the patient was contacted the subsequent day and instructed to return back to [facility] ED for further evaluation and management, leading to her formal admission on 09/24/2021 for MRSA bacteremia. She was started on IV vancomycin, with repeat blood cultures also reportedly positive for MRSA. She underwent MRI of the bilateral knees demonstrating moderate to large effusions concerning for septic arthritis.  She also underwent right ankle MRI, which demonstrated evidence of effusion, potentially septic in nature.  Additionally, MRIs of the bilateral wrists showed evidence of tenosynovitis without evidence of overt septic arthritis.  Subsequently, MRI of the thoracic spine showed evidence of epidural abscess at T2/T3.  Cardiology was consulted, and ultimately the patient underwent TTE as well as TEE which showed no evidence of bacterial endocarditis. Subsequently, the patient was transferred to [facility] for further evaluation and management of her MRSA bacteremia,  epidural abscess of the thoracic spine, and multiple suspected septic joints requiring extensive washout procedure by orthopedic surgery, as above. ?ID, orthopedic surgery, neurosurgery following. ?   ? ?Assessment & Plan: ? ?Principal Problem: ?  MRSA bacteremia ?Active Problems: ?  Septic joint ([place]) ?  Tenosynovitis ?  Epidural abscess ?  Depression ?  Hypoalbuminemia: Severe ?  Hypokalemia ?  Constipation ?  Anemia ?  Edema due to hypervolemia ? ? ?Assessment and Plan: ?* MRSA bacteremia ? Initial blood cultures positive for MRSA on 09/23/2021, with repeat blood cultures drawn following interval initiation of IV vancomycin also positive during hospital course at [facility].  ?- Multiple potential sources for MRSA bacteremia, including epidural abscess associated with T2/T3, with multiple potential septic joints including the bilateral knees. ?-TEE demonstrated no evidence of bacterial endocarditis ?-Repeat blood cultures x2 (09/27/2021) positive. ?-ID following and due to ongoing positive blood cultures, ongoing fevers IV antibiotics have been changed from IV vancomycin to IV daptomycin and IV teflaro. ?-Repeat blood cultures ordered and pending. ?-Patient seen in consultation by neurosurgery, Dr. 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 ?-MRI of the L-spine ordered which showed hyperintense collection with possible early  infection and ?left lateral recess phlegmonia posterior to the L5 vertebral bodyl.Potential L4/L5  discitis /osteomyelitis. ?-At this time neurosurgery does not feel or recommend surgical evacuation of epidural abscess at this time and recommending continued IV antibiotics. ?-Currently on Teflaro,  Daptomycin ?  ?   ? ?Tenosynovitis ?  ?Tenosynovitis of the  bilateral wrists: Identified on MRI at [facility], these images reportedly showing no evidence of overt septic arthritis.    Ortho is not planning for intervention.  Continue antibiotics ? ?Septic joint ([place]) ?-Moderate to large effusions identified on bilateral knees per MRIs performed at [facility], concerning for septic arthritis in bilateral distribution, in the setting of presenting MRSA bacteremia.   ?-Patient reassessed by orthopedics, Dr. Doreatha Martin at [facility] and at this point in time no surgical intervention from orthopedics is recommended ?-IV vancomycin has been changed to IV daptomycin and IV teflaro per ID recommendations due to ongoing positive blood cultures and fevers. ?-ID following. ? ?Epidural abscess ?  ? Epidural abscess of the thoracic spine. MRI of the thoracic spine performed during [facility] hospital course suggestive of epidural abscess at T2/T3, without associated acute focal neurologic deficits.  Neurosurgery thinks that there is no evidence of cord compression . ?-MRI of the C-spine with dorsal epidural abscess throughout the cervical spine contributing to moderate spinal stenosis.  No cord signal abnormality. ?-Per neurosurgery no surgical intervention at this time and recommending continuation of IV antibiotics. ? ?  ? ?Depression ?-Zoloft. ?  ? ?Edema due to hypervolemia ?- Patient with anasarca ?-2D echo/TEE done during this admission with normal EF,NWMA. ?-Unclear etiology but hypoalbuminemia might have contributed ?-We will continue Lasix IV for now, monitor BMP ?-There is pronounced asymmetric edema on the left upper extremity and right lower extremity.  We will check venous Dopplers to R/O DVT ? ?Anemia ?- Patient with anemia hemoglobin currently at 9.0 from 11.3 (09/25/2021) ?-Patient with no overt bleeding ?-Anemia panel showed low iron of 17,given IV iron ?-Follow H&H. ?-Transfusion threshold hemoglobin < 7.  ? ?Constipation ?-On MiraLAX  daily, Senokot-S twice daily. ? ?Hypokalemia ?- Monitor and supplement as needed ? ?Hypoalbuminemia: Severe ?- Patient noted on lab work to have a albumin level of < 1.5 from 2.7 on 09/23/2021. ?-unclear etiology ?-Patient was started on IV albumin every 6 hours x48 hours on 09/27/2021. ?-Placed on Ensure supplementation. ?-Diet has been liberalized to a regular diet. ? ? ? ? ? ? ? ?  ?  ? ?DVT prophylaxis:SCDs Start: 09/27/21 0030 ? ? ?  Code Status: Full Code ? ?Family Communication: Family at bedside ? ?Patient status: Inpatient ? ?Patient is from : Home ? ?Anticipated discharge to: Not sure at this point ? ?Estimated DC date: Not sure at this point, might end up with prolonged hospitalization ? ? ?Consultants: Orthopedics, neurosurgery, ID ? ?Procedures: None ? ?Antimicrobials:  ?Anti-infectives (From admission, onward)  ? ? Start     Dose/Rate Route Frequency Ordered Stop  ? 09/28/21 1030  DAPTOmycin (CUBICIN) 650 mg in sodium chloride 0.9 % IVPB       ? 8 mg/kg ? 81.7 kg ?126 mL/hr over 30 Minutes Intravenous Daily 09/28/21 0932    ? 09/28/21 1030  ceftaroline (TEFLARO) 600 mg in sodium chloride 0.9 % 100 mL IVPB       ? 600 mg ?100 mL/hr over 60 Minutes Intravenous Every 8 hours 09/28/21 0932    ? 09/27/21 0215  vancomycin (VANCOCIN) IVPB 1000 mg/200 mL premix  Status:  Discontinued       ? 1,000 mg ?200 mL/hr over 60 Minutes Intravenous 2 times daily 09/27/21 0115 09/28/21 0932  ? ?  ? ? ?Subjective: ?Patient seen and examined at the bedside this morning.  Hemodynamically stable.  Family at the bedside.  Had long discussion about her current clinical situation and our  plan.  Complains of pain everywhere.  Has anasarca.  Hemodynamically stable ? ?Objective: ?Vitals:  ? 09/28/21 2000 09/28/21 2304 09/29/21 0358 09/29/21 [id number]  ?BP: (!) 104/53 96/72 105/61 121/62  ?Pulse: 87 87 94 97  ?Resp: (!) 21 15 18    ?Temp:  98.2 ?F (36.8 ?C) 98.9 ?F (37.2 ?C) (!) 101.1 ?F (38.4 ?C)  ?TempSrc:  Oral Oral Oral  ?SpO2: 95% 93%  93% 95%  ?Weight:      ?Height:      ? ? ?Intake/Output Summary (Last 24 hours) at 09/29/2021 1101 ?Last data filed at 09/29/2021 0600 ?Gross per 24 hour  ?Intake 665.47 ml  ?Output 3075 ml  ?Net [phone]

## 2021-09-29 NOTE — Plan of Care (Signed)
  Problem: Activity: Goal: Risk for activity intolerance will decrease Outcome: Progressing   Problem: Pain Managment: Goal: General experience of comfort will improve Outcome: Progressing   Problem: Safety: Goal: Ability to remain free from injury will improve Outcome: Progressing   

## 2021-09-29 NOTE — Plan of Care (Signed)

## 2021-09-30 ENCOUNTER — Inpatient Hospital Stay (HOSPITAL_COMMUNITY): Payer: 59 | Admitting: Anesthesiology

## 2021-09-30 ENCOUNTER — Inpatient Hospital Stay (HOSPITAL_COMMUNITY): Payer: 59

## 2021-09-30 ENCOUNTER — Encounter (HOSPITAL_COMMUNITY): Payer: Self-pay | Admitting: Internal Medicine

## 2021-09-30 ENCOUNTER — Encounter (HOSPITAL_COMMUNITY)
Admission: AD | Disposition: A | Discharge status: Home / Self Care | Payer: Self-pay | Source: Other Acute Inpatient Hospital | Attending: Internal Medicine

## 2021-09-30 DIAGNOSIS — R7881 Bacteremia: Secondary | ICD-10-CM | POA: Diagnosis not present

## 2021-09-30 DIAGNOSIS — L02414 Cutaneous abscess of left upper limb: Secondary | ICD-10-CM

## 2021-09-30 DIAGNOSIS — B9562 Methicillin resistant Staphylococcus aureus infection as the cause of diseases classified elsewhere: Secondary | ICD-10-CM | POA: Diagnosis not present

## 2021-09-30 DIAGNOSIS — L02512 Cutaneous abscess of left hand: Secondary | ICD-10-CM

## 2021-09-30 HISTORY — PX: I & D EXTREMITY: SHX5045

## 2021-09-30 LAB — CBC WITH DIFFERENTIAL/PLATELET
Abs Immature Granulocytes: 1.67 10*3/uL — ABNORMAL HIGH (ref 0.00–0.07)
Basophils Absolute: 0.1 10*3/uL (ref 0.0–0.1)
Basophils Relative: 0 %
Eosinophils Absolute: 0 10*3/uL (ref 0.0–0.5)
Eosinophils Relative: 0 %
HCT: 29.8 % — ABNORMAL LOW (ref 36.0–46.0)
Hemoglobin: 9.8 g/dL — ABNORMAL LOW (ref 12.0–15.0)
Immature Granulocytes: 8 %
Lymphocytes Relative: 4 %
Lymphs Abs: 0.8 10*3/uL (ref 0.7–4.0)
MCH: 30.6 pg (ref 26.0–34.0)
MCHC: 32.9 g/dL (ref 30.0–36.0)
MCV: 93.1 fL (ref 80.0–100.0)
Monocytes Absolute: 0.9 10*3/uL (ref 0.1–1.0)
Monocytes Relative: 5 %
Neutro Abs: 16.8 10*3/uL — ABNORMAL HIGH (ref 1.7–7.7)
Neutrophils Relative %: 83 %
Platelets: 419 10*3/uL — ABNORMAL HIGH (ref 150–400)
RBC: 3.2 MIL/uL — ABNORMAL LOW (ref 3.87–5.11)
RDW: 14.1 % (ref 11.5–15.5)
WBC: 20.4 10*3/uL — ABNORMAL HIGH (ref 4.0–10.5)
nRBC: 0.1 % (ref 0.0–0.2)

## 2021-09-30 LAB — BASIC METABOLIC PANEL
Anion gap: 10 (ref 5–15)
BUN: 11 mg/dL (ref 6–20)
CO2: 27 mmol/L (ref 22–32)
Calcium: 7.6 mg/dL — ABNORMAL LOW (ref 8.9–10.3)
Chloride: 100 mmol/L (ref 98–111)
Creatinine, Ser: 0.82 mg/dL (ref 0.44–1.00)
GFR, Estimated: >60 mL/min (ref >60)
Glucose, Bld: 111 mg/dL — ABNORMAL HIGH (ref 70–99)
Potassium: 3.5 mmol/L (ref 3.5–5.1)
Sodium: 137 mmol/L (ref 135–145)

## 2021-09-30 LAB — URINE CULTURE: Culture: NO GROWTH

## 2021-09-30 IMAGING — MR MR [PERSON_NAME] LOW WO/W CM*R*
10 series · 40 of 40 positions shown · IV contrast (gadavist)
Comparison: Right knee radiographs [DATE], MRI right knee
[DATE], MRI bilateral femurs, CT right femur

CLINICAL DATA: Lower leg soft tissue infection suspected.
Bacteremia in the setting of epidural abscess of the thoracic spine.
Multiple suspected septic joints. Presented from home to the
emergency department through [DATE] complaining of polyarthralgias.
Redness and medial left ankle going up to mid calf.

EXAM:
MRI OF LOWER RIGHT EXTREMITY WITHOUT AND WITH CONTRAST
TECHNIQUE: Multiplanar, multisequence MR imaging of the left tibia and fibula
was performed both before and after administration of intravenous
contrast.
CONTRAST:  10mL GADAVIST GADOBUTROL 1 MMOL/ML IV SOLN

[Series 4: ax t1_comp · axial · right · 5.0mm · 0.78mm/px · z∈[-177,+227]mm · 7 of 68 slices shown]
[im 1/68]
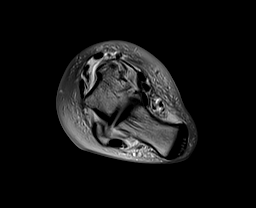
[im 12/68]
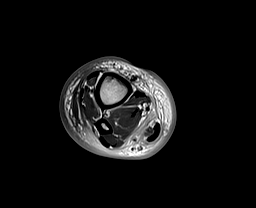
[im 23/68]
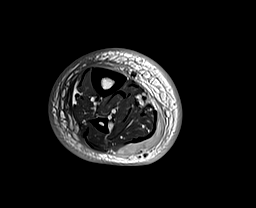
[im 34/68]
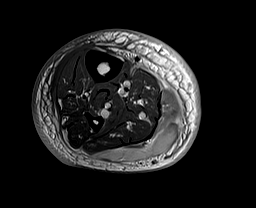
[im 45/68]
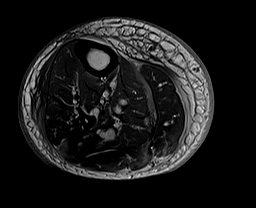
[im 56/68]
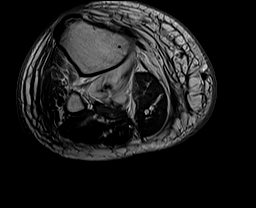
[im 68/68]
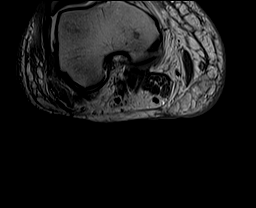

[Series 8: composed cor t1_comp_filt · coronal · right · 6.0mm · 1.25mm/px · 3 of 35 slices shown]
[im 1/35]
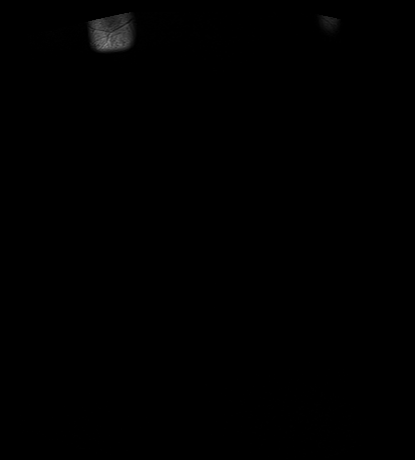
[im 18/35]
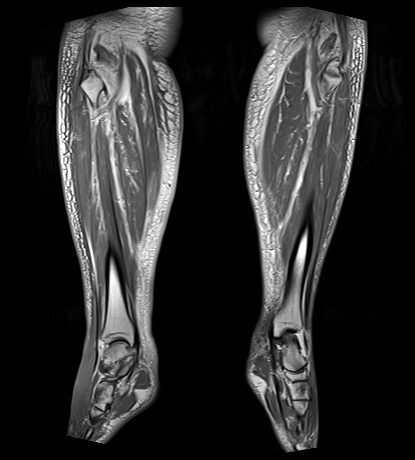
[im 35/35]
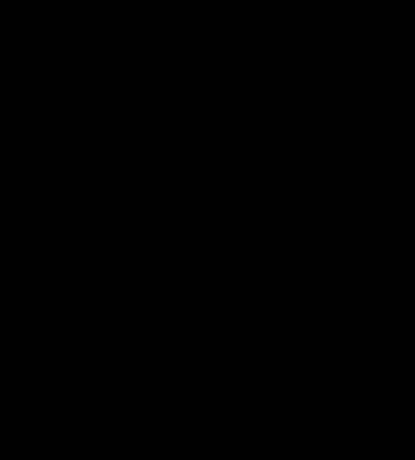

[Series 12: composed cor stir_comp_filt · coronal · right · 6.0mm · 1.25mm/px · 3 of 36 slices shown]
[im 1/36]
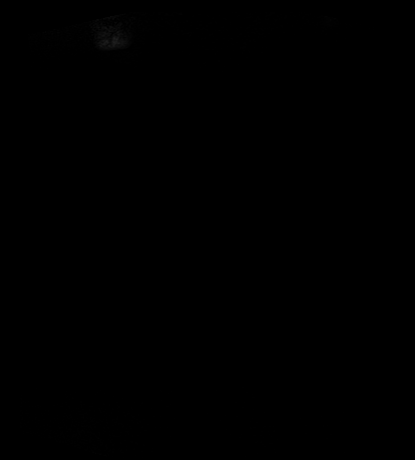
[im 18/36]
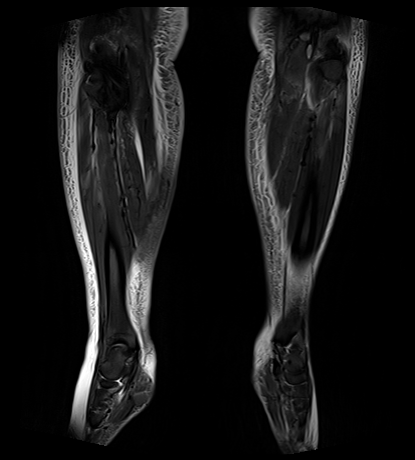
[im 36/36]
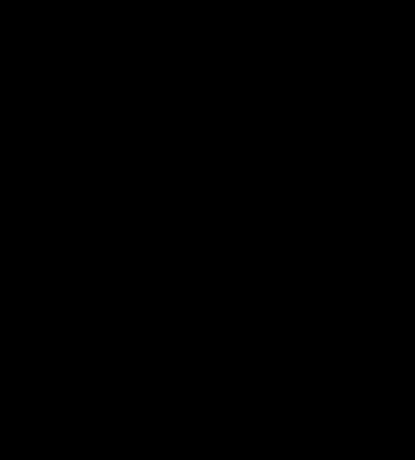

[Series 15: T2 · axial · right · 5.0mm · 0.87mm/px · z∈[-176,+21]mm · 3 of 34 slices shown (1 of 2)]
[im 1/34]
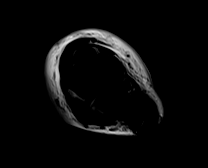
[im 17/34]
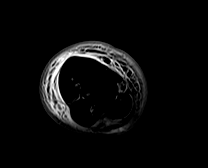
[im 34/34]
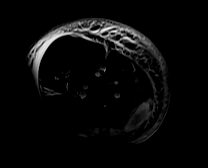

[Series 15: T2 · axial · right · 5.0mm · 0.87mm/px · z∈[+31,+228]mm · 3 of 34 slices shown (2 of 2)]
[im 1/34]
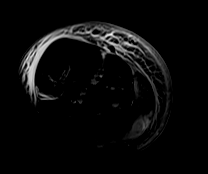
[im 17/34]
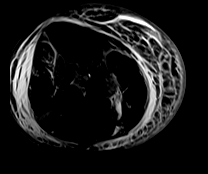
[im 34/34]
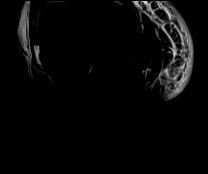

[Series 19: T1 fat-sat · axial · right · 5.0mm · 0.96mm/px · z∈[-177,+227]mm · 6 of 68 slices shown (1 of 3)]
[im 1/68]
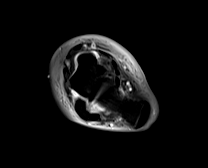
[im 14/68]
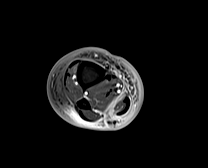
[im 27/68]
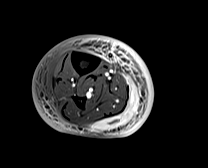
[im 41/68]
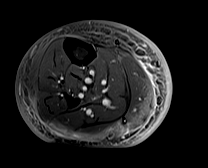
[im 54/68]
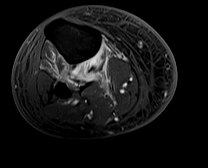
[im 68/68]
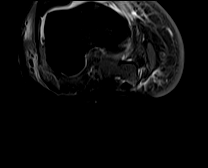

[Series 20: T2 fat-sat · sagittal · right · 4.0mm · 1.15mm/px · 3 of 35 slices shown (1 of 2)]
[im 1/35]
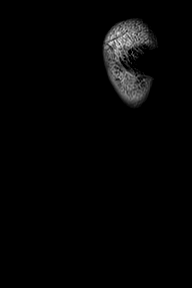
[im 18/35]
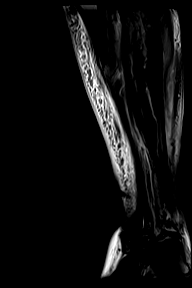
[im 35/35]
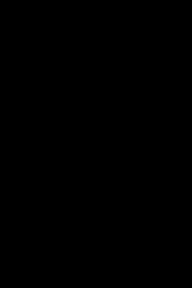

[Series 21: T2 fat-sat · sagittal · right · 4.0mm · 1.15mm/px · 3 of 35 slices shown (2 of 2)]
[im 1/35]
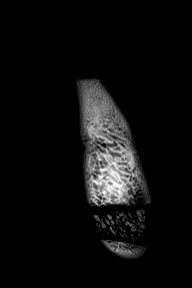
[im 18/35]
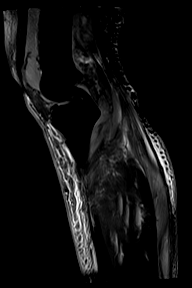
[im 35/35]
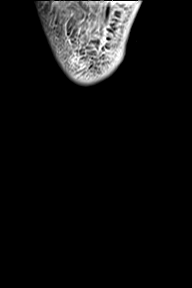

[Series 24: T1 fat-sat · axial · right · 5.0mm · 0.87mm/px · z∈[-176,+228]mm · 6 of 68 slices shown (2 of 3)]
[im 1/68]
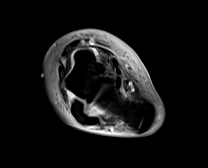
[im 14/68]
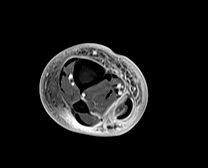
[im 27/68]
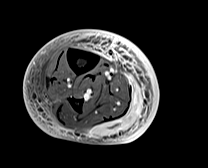
[im 41/68]
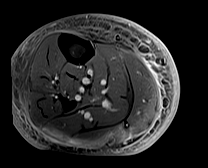
[im 54/68]
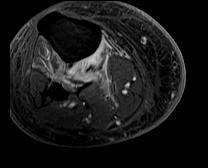
[im 68/68]
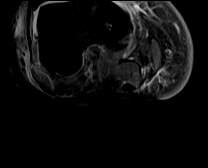

[Series 27: T1 fat-sat · sagittal · right · 5.0mm · 0.86mm/px · 3 of 36 slices shown (3 of 3)]
[im 1/36]
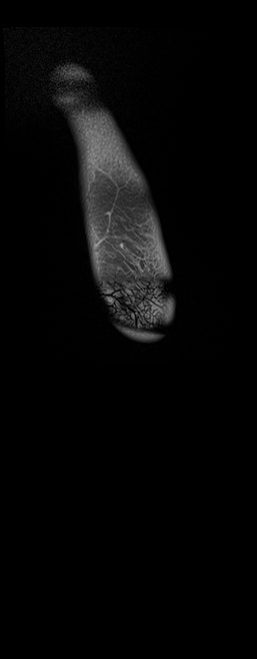
[im 18/36]
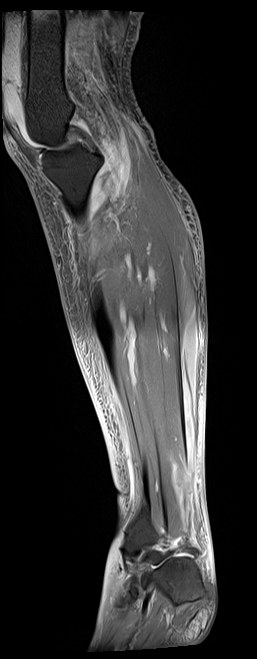
[im 36/36]
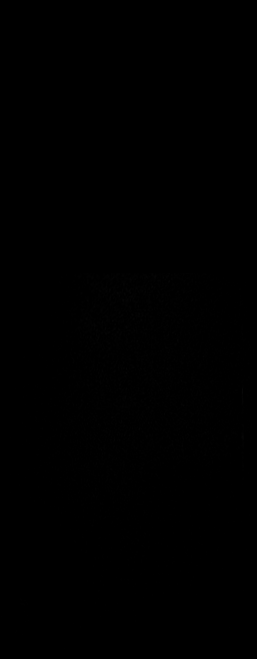

[40 of 40 positions shown; findings below may reference images not displayed]

FINDINGS: Bones/Joint/Cartilage

Partial visualization of the previously seen small chondroid matrix
likely enchondroma within the distal medial femur. No marrow edema
or enhancement is seen within the left tibia or fibula marrow. No
cortical erosion is seen.

On large field coronal images the visualized left tibia and fibula
marrow is unremarkable. There is moderate diffuse subcutaneous fat
edema seen throughout the left calf and visualized foot.

Moderate degenerative changes are seen within the bilateral knees.

Moderate bilateral calcaneal heel spurs.

Ligaments

Grossly unremarkable.

Muscles and Tendons

There is intermediate T1 and intermediate to increased T2 signal
complex fluid throughout the distal aspect of the lateral greater
than medial aspects of the distal gastrocnemius muscle (axial series
especially given multiple abscesses seen throughout the thigh
musculature on prior MRIs. Intramuscular hematomas can have a
similar appearance. There is edema seen throughout the rest of the
distal medial gastrocnemius muscle.

There is edema seen within the proximal aspect of the tibialis
anterior and extensor digitorum longus musculature within the
anterior compartment (axial series 15 images 12 through 19).

Soft tissues

There is high-grade edema and swelling of the visualized calf and
ankle subcutaneous fat. There is moderate fluid of intermediate to
increased T1 and increased T2 signal intensity tracking in between
the soleus and medial head of the gastrocnemius muscles measuring up
to 10 mm in transverse thickness (as measured on axial series 19,
image 23). The signal intensity suggests mild complexity of the
fluid, possibly including proteinaceous or blood products.

There is again a large knee joint effusion.
IMPRESSION: :
IMPRESSION: 1. Diffuse subcutaneous fat soft tissue swelling and edema
throughout the right calf.
2. There is a complex fluid collection within the distal lateral
aspect of the right gastrocnemius musculature with signal
characteristics suspicious for abscesses. Note is made of additional
intramuscular abscesses within the bilateral quadriceps musculature
seen on prior MRI. There is also mildly complex fluid tracking
between the soleus and medial head of the gastrocnemius muscle on
the current MRI.
3. Large right knee joint effusion again suspicious for septic
arthritis given patient history.
4. No cortical erosion is seen to indicate acute osteomyelitis.

## 2021-09-30 SURGERY — IRRIGATION AND DEBRIDEMENT EXTREMITY
Anesthesia: General | Site: Arm Lower | Laterality: Left

## 2021-09-30 MED ORDER — LIDOCAINE 2% (20 MG/ML) 5 ML SYRINGE
INTRAMUSCULAR | Status: AC
  Filled 2021-09-30: qty 5

## 2021-09-30 MED ORDER — CHLORHEXIDINE GLUCONATE 0.12 % MT SOLN
OROMUCOSAL | Status: AC
  Administered 2021-09-30: 15 mL via OROMUCOSAL
  Filled 2021-09-30: qty 15

## 2021-09-30 MED ORDER — TOBRAMYCIN SULFATE 80 MG/2ML IJ SOLN
40.0000 mg | Freq: Once | INTRAMUSCULAR | Status: DC
  Filled 2021-09-30: qty 1

## 2021-09-30 MED ORDER — POVIDONE-IODINE 10 % EX SWAB
2.0000 "application " | Freq: Once | CUTANEOUS | Status: AC
  Administered 2021-09-30: 2 via TOPICAL

## 2021-09-30 MED ORDER — FENTANYL CITRATE (PF) 250 MCG/5ML IJ SOLN
INTRAMUSCULAR | Status: AC
  Filled 2021-09-30: qty 5

## 2021-09-30 MED ORDER — DEXAMETHASONE SODIUM PHOSPHATE 10 MG/ML IJ SOLN
INTRAMUSCULAR | Status: DC | PRN
  Administered 2021-09-30: 5 mg via INTRAVENOUS

## 2021-09-30 MED ORDER — BUPIVACAINE HCL (PF) 0.25 % IJ SOLN
INTRAMUSCULAR | Status: AC
  Filled 2021-09-30: qty 30

## 2021-09-30 MED ORDER — LIDOCAINE 2% (20 MG/ML) 5 ML SYRINGE
INTRAMUSCULAR | Status: DC | PRN
  Administered 2021-09-30: 80 mg via INTRAVENOUS

## 2021-09-30 MED ORDER — 0.9 % SODIUM CHLORIDE (POUR BTL) OPTIME
TOPICAL | Status: DC | PRN
  Administered 2021-09-30: 1000 mL

## 2021-09-30 MED ORDER — LIDOCAINE HCL (PF) 1 % IJ SOLN
30.0000 mL | INTRAMUSCULAR | Status: DC
  Filled 2021-09-30: qty 30

## 2021-09-30 MED ORDER — CHLORHEXIDINE GLUCONATE 0.12 % MT SOLN: 15.0000 mL | Freq: Once | OROMUCOSAL | Status: AC

## 2021-09-30 MED ORDER — ONDANSETRON HCL 4 MG/2ML IJ SOLN
INTRAMUSCULAR | Status: DC | PRN
  Administered 2021-09-30: 4 mg via INTRAVENOUS

## 2021-09-30 MED ORDER — ACETAMINOPHEN 500 MG PO TABS
1000.0000 mg | ORAL_TABLET | Freq: Once | ORAL | Status: AC
  Administered 2021-09-30: 1000 mg via ORAL
  Filled 2021-09-30: qty 2

## 2021-09-30 MED ORDER — ONDANSETRON HCL 4 MG/2ML IJ SOLN: 4.0000 mg | Freq: Once | INTRAMUSCULAR | Status: DC | PRN

## 2021-09-30 MED ORDER — FENTANYL CITRATE (PF) 100 MCG/2ML IJ SOLN: 25.0000 ug | INTRAMUSCULAR | Status: DC | PRN

## 2021-09-30 MED ORDER — PROPOFOL 10 MG/ML IV BOLUS
INTRAVENOUS | Status: AC
  Filled 2021-09-30: qty 20

## 2021-09-30 MED ORDER — PROPOFOL 10 MG/ML IV BOLUS
INTRAVENOUS | Status: DC | PRN
  Administered 2021-09-30: 130 mg via INTRAVENOUS

## 2021-09-30 MED ORDER — MIDAZOLAM HCL 2 MG/2ML IJ SOLN
INTRAMUSCULAR | Status: AC
  Filled 2021-09-30: qty 2

## 2021-09-30 MED ORDER — LACTATED RINGERS IV SOLN: INTRAVENOUS | Status: DC

## 2021-09-30 MED ORDER — ONDANSETRON HCL 4 MG/2ML IJ SOLN
INTRAMUSCULAR | Status: AC
  Filled 2021-09-30: qty 2

## 2021-09-30 MED ORDER — CHLORHEXIDINE GLUCONATE 4 % EX LIQD
60.0000 mL | Freq: Once | CUTANEOUS | Status: DC
  Filled 2021-09-30: qty 60

## 2021-09-30 MED ORDER — DEXAMETHASONE SODIUM PHOSPHATE 10 MG/ML IJ SOLN
INTRAMUSCULAR | Status: AC
  Filled 2021-09-30: qty 1

## 2021-09-30 MED ORDER — FENTANYL CITRATE (PF) 250 MCG/5ML IJ SOLN
INTRAMUSCULAR | Status: DC | PRN
  Administered 2021-09-30: 50 ug via INTRAVENOUS
  Administered 2021-09-30 (×2): 25 ug via INTRAVENOUS
  Administered 2021-09-30: 50 ug via INTRAVENOUS

## 2021-09-30 MED ORDER — MIDAZOLAM HCL 2 MG/2ML IJ SOLN
INTRAMUSCULAR | Status: DC | PRN
  Administered 2021-09-30: 2 mg via INTRAVENOUS

## 2021-09-30 MED ORDER — SODIUM CHLORIDE 0.9 % IR SOLN
Status: DC | PRN
  Administered 2021-09-30: 3000 mL

## 2021-09-30 SURGICAL SUPPLY — 55 items
BAG COUNTER SPONGE SURGICOUNT (BAG) ×2 IMPLANT
BNDG COHESIVE 1X5 TAN STRL LF (GAUZE/BANDAGES/DRESSINGS) IMPLANT
BNDG CONFORM 2 STRL LF (GAUZE/BANDAGES/DRESSINGS) IMPLANT
BNDG ELASTIC 3X5.8 VLCR STR LF (GAUZE/BANDAGES/DRESSINGS) ×1 IMPLANT
BNDG ELASTIC 4X5.8 VLCR STR LF (GAUZE/BANDAGES/DRESSINGS) ×2 IMPLANT
BNDG ESMARK 4X9 LF (GAUZE/BANDAGES/DRESSINGS) ×2 IMPLANT
BNDG GAUZE ELAST 4 BULKY (GAUZE/BANDAGES/DRESSINGS) ×2 IMPLANT
CORD BIPOLAR FORCEPS 12FT (ELECTRODE) ×2 IMPLANT
COVER SURGICAL LIGHT HANDLE (MISCELLANEOUS) ×2 IMPLANT
CUFF TOURN SGL QUICK 18X4 (TOURNIQUET CUFF) ×2 IMPLANT
CUFF TOURN SGL QUICK 24 (TOURNIQUET CUFF)
CUFF TRNQT CYL 24X4X16.5-23 (TOURNIQUET CUFF) IMPLANT
DRAIN PENROSE 1/4X12 LTX STRL (WOUND CARE) IMPLANT
DRAPE SURG 17X23 STRL (DRAPES) ×2 IMPLANT
DRSG ADAPTIC 3X8 NADH LF (GAUZE/BANDAGES/DRESSINGS) ×2 IMPLANT
ELECT REM PT RETURN 9FT ADLT (ELECTROSURGICAL)
ELECTRODE REM PT RTRN 9FT ADLT (ELECTROSURGICAL) IMPLANT
GAUZE SPONGE 4X4 12PLY STRL (GAUZE/BANDAGES/DRESSINGS) ×2 IMPLANT
GAUZE XEROFORM 1X8 LF (GAUZE/BANDAGES/DRESSINGS) ×1 IMPLANT
GAUZE XEROFORM 5X9 LF (GAUZE/BANDAGES/DRESSINGS) IMPLANT
GLOVE SURG ORTHO LTX SZ8 (GLOVE) ×2 IMPLANT
GLOVE SURG UNDER POLY LF SZ8.5 (GLOVE) ×2 IMPLANT
GOWN STRL REUS W/ TWL LRG LVL3 (GOWN DISPOSABLE) ×3 IMPLANT
GOWN STRL REUS W/ TWL XL LVL3 (GOWN DISPOSABLE) ×1 IMPLANT
GOWN STRL REUS W/TWL LRG LVL3 (GOWN DISPOSABLE) ×1
GOWN STRL REUS W/TWL XL LVL3 (GOWN DISPOSABLE) ×1
HANDPIECE INTERPULSE COAX TIP (DISPOSABLE)
KIT BASIN OR (CUSTOM PROCEDURE TRAY) ×2 IMPLANT
KIT TURNOVER KIT B (KITS) ×2 IMPLANT
MANIFOLD NEPTUNE II (INSTRUMENTS) ×2 IMPLANT
NDL HYPO 25GX1X1/2 BEV (NEEDLE) IMPLANT
NEEDLE HYPO 25GX1X1/2 BEV (NEEDLE) IMPLANT
NS IRRIG 1000ML POUR BTL (IV SOLUTION) ×2 IMPLANT
PACK ORTHO EXTREMITY (CUSTOM PROCEDURE TRAY) ×2 IMPLANT
PAD ARMBOARD 7.5X6 YLW CONV (MISCELLANEOUS) ×4 IMPLANT
PAD CAST 4YDX4 CTTN HI CHSV (CAST SUPPLIES) ×1 IMPLANT
PADDING CAST COTTON 4X4 STRL (CAST SUPPLIES) ×1
SET CYSTO W/LG BORE CLAMP LF (SET/KITS/TRAYS/PACK) ×1 IMPLANT
SET HNDPC FAN SPRY TIP SCT (DISPOSABLE) IMPLANT
SOAP 2 % CHG 4 OZ (WOUND CARE) ×2 IMPLANT
SPLINT FIBERGLASS 3X12 (CAST SUPPLIES) ×1 IMPLANT
SPONGE T-LAP 18X18 ~~LOC~~+RFID (SPONGE) ×1 IMPLANT
SPONGE T-LAP 4X18 ~~LOC~~+RFID (SPONGE) ×1 IMPLANT
SUT ETHILON 4 0 PS 2 18 (SUTURE) IMPLANT
SUT ETHILON 5 0 P 3 18 (SUTURE)
SUT NYLON ETHILON 5-0 P-3 1X18 (SUTURE) IMPLANT
SWAB COLLECTION DEVICE MRSA (MISCELLANEOUS) ×2 IMPLANT
SWAB CULTURE ESWAB REG 1ML (MISCELLANEOUS) ×1 IMPLANT
SYR CONTROL 10ML LL (SYRINGE) IMPLANT
TOWEL GREEN STERILE (TOWEL DISPOSABLE) ×2 IMPLANT
TOWEL GREEN STERILE FF (TOWEL DISPOSABLE) ×2 IMPLANT
TUBE CONNECTING 12X1/4 (SUCTIONS) ×2 IMPLANT
UNDERPAD 30X36 HEAVY ABSORB (UNDERPADS AND DIAPERS) ×2 IMPLANT
WATER STERILE IRR 1000ML POUR (IV SOLUTION) ×2 IMPLANT
YANKAUER SUCT BULB TIP NO VENT (SUCTIONS) ×2 IMPLANT

## 2021-09-30 NOTE — Transfer of Care (Signed)
Immediate Anesthesia Transfer of Care Note ? ?Patient: Jill Cox ? ?Procedure(s) Performed: IRRIGATION AND DEBRIDEMENT LEFT FOREARM ABSCESS (Left: Arm Lower) ? ?Patient Location: PACU ? ?Anesthesia Type:General ? ?Level of Consciousness: drowsy ? ?Airway & Oxygen Therapy: Patient Spontanous Breathing ? ?Post-op Assessment: Report given to RN and Post -op Vital signs reviewed and stable ? ?Post vital signs: Reviewed and stable ? ?Last Vitals:  ?Vitals Value Taken Time  ?BP 105/50 09/30/21 1719  ?Temp    ?Pulse 78 09/30/21 1723  ?Resp 17 09/30/21 1723  ?SpO2 96 % 09/30/21 1723  ?Vitals shown include unvalidated device data. ? ?Last Pain:  ?Vitals:  ? 09/30/21 1459  ?TempSrc: Oral  ?PainSc:   ?   ? ?Patients Stated Pain Goal: 1 (09/29/21 2155) ? ?Complications: No notable events documented. ?

## 2021-09-30 NOTE — Anesthesia Procedure Notes (Signed)
Procedure Name: LMA Insertion ?Date/Time: 09/30/2021 2:37 PM ?Performed by: Carolan Clines, CRNA ?Pre-anesthesia Checklist: Patient identified, Emergency Drugs available, Suction available and Patient being monitored ?Patient Re-evaluated:Patient Re-evaluated prior to induction ?Oxygen Delivery Method: Circle System Utilized ?Preoxygenation: Pre-oxygenation with 100% oxygen ?Induction Type: IV induction ?LMA: LMA inserted ?LMA Size: 4.0 ?Number of attempts: 1 ?Placement Confirmation: positive ETCO2 ?Tube secured with: Tape ?Dental Injury: Teeth and Oropharynx as per pre-operative assessment  ? ? ? ? ?

## 2021-09-30 NOTE — Progress Notes (Signed)
Mobility Specialist Progress Note  ? ? 09/30/21 1122  ?Mobility  ?Activity Contraindicated/medical hold  ? ?Pt in pain and waiting for MRI. Will f/u as schedule permits.  ? ?Jill Cox ?Mobility Specialist  ?  ?

## 2021-09-30 NOTE — Progress Notes (Signed)
Id brief note ? ? ?Reviewed ortho/nsg evaluation today ?Reviewed ct left forearm and rle ct finding ? ?Blood cx from 3/16 and 3/17 ngtd on ceftaroline and dapto ? ? ?-?I&D of abscess in LUE today ?-appreciate ortho/nsg monitoring ?-continue dapto/ceftaroline ?-would repeat blood cx again tomorrow ? ?

## 2021-09-30 NOTE — Progress Notes (Addendum)
I have seen and examined the patient, I agree with the findings above by Montez Morita, PA-C, and I have directed the plan for treatment as noted. ? ?Furthermore I directly observed in the operating room Dr. Glenna Durand incisional debridement which did not demonstrate significant fluid collections or findings that would warrant a more aggressive surgical approach at this time on her other involved areas, rather continued IV Abx and targeted intervention where MRI identifies infection. This has been ordered postoperatively. ? ?Budd Palmer, MD ? ?    ? ? ? ? ? ? ? ?                                       ? ?                              [facility] Progress Note ? ?Patient ID: ?Jill Cox ?MRN: [id number] ?DOB/AGE: [birth year] 46 y.o. ? ?Subjective: ? ?Pt seen in follow up of her CTs from yesterday ? Today she states that her R knee does not feel bad but has more pain in R ankle  ? ?She is having difficulty ambulating  ? ?Doppler yesterday neg for DVT B LEx ? ?Hand surgery plans to perform I&D dorsum L wrist  ? ?Pt denies history of rheumatologic conditions. Denies any other medical history  ? ?Antibiotics currently daptomycin and ceftaroline started on 3/16 ? ?ROS ?As above  ? ?Objective:  ? ?VITALS:   ?Vitals:  ? 09/29/21 1502 09/29/21 1927 09/30/21 0500 09/30/21 0734  ?BP: (!) 110/54 (!) 106/44 (!) 104/46 134/60  ?Pulse: 99 94 90 100  ?Resp:  17 18 19   ?Temp: 99.2 ?F (37.3 ?C) 99.2 ?F (37.3 ?C) 99.2 ?F (37.3 ?C) (!) 100.9 ?F (38.3 ?C)  ?TempSrc: Oral Oral Oral Oral  ?SpO2: 95% 95% 96% 99%  ?Weight:   82 kg   ?Height:      ? ? ?Estimated body mass index is 29.18 kg/m? as calculated from the following: ?  Height as of this encounter: 5\' 6"  (1.676 m). ?  Weight as of this encounter: 82 kg. ? ? ?Intake/Output   ?   03/17 0701 ?03/18 0700 03/18 0701 ?03/19 0700  ? IV Piggyback    ? Total Intake(mL/kg)    ? Urine (mL/kg/hr)    ? Total Output    ? Net    ?     ? Urine Occurrence 3 x   ?  ? ?LABS ? ?No results  found for this or any previous visit (from the past 24 hour(s)). ? ? ?PHYSICAL EXAM:  ? ?Gen: sitting up in bed, pleasant, appears uncomfortable  ?Lungs: unlabored  ?Skin: diffuse rash noted, most prominent on L thigh  ?Ext:  ?     Right Lower Extremity  ?            Healed old laceration lateral R knee  ? Extensive pitting edema throughout entire leg  ? Exquisite erythema posterior R lower leg with severe tenderness most notable to mid posterior calf ?Tolerated gentle passive ankle ROM   ?Tolerates gentle passive knee ROM from near full extension to 80 degrees of flexion.  Pain at end range noted posteriorly along the popliteal space  ? I do not appreciate much of an effusion to the R knee  ?Ext warm  ?+ DP  pulse ?DPN, SPN, TN sensation intact and symmetric  ?EHL, FHL, lesser toe motor intact.  Can perform gentle ankle flexion, extension, inversion and eversion  ? ?     Left Lower Extremity  ? Edema noted but not as severe as R leg  ? No significant erythema noted to L leg ? + rash medial thigh and lower leg ? Moves knee and ankle without difficulty  ? Motor and sensory functions intact  ? ? ? ?Assessment/Plan: ?   ? ?Principal Problem: ?  MRSA bacteremia ?Active Problems: ?  Depression ?  Septic joint (HCC) ?  Epidural abscess ?  Tenosynovitis ?  Hypoalbuminemia: Severe ?  Hypokalemia ?  Constipation ?  Anemia ?  Edema due to hypervolemia ? ? ?Anti-infectives (From admission, onward)  ? ? Start     Dose/Rate Route Frequency Ordered Stop  ? 09/30/21 1030  tobramycin (NEBCIN) injection 40 mg       ? 40 mg Intramuscular  Once 09/30/21 0945    ? 09/28/21 1030  DAPTOmycin (CUBICIN) 650 mg in sodium chloride 0.9 % IVPB       ? 8 mg/kg ? 81.7 kg ?126 mL/hr over 30 Minutes Intravenous Daily 09/28/21 0932    ? 09/28/21 1030  ceftaroline (TEFLARO) 600 mg in sodium chloride 0.9 % 100 mL IVPB       ? 600 mg ?100 mL/hr over 60 Minutes Intravenous Every 8 hours 09/28/21 0932    ? 09/27/21 0215  vancomycin (VANCOCIN) IVPB 1000  mg/200 mL premix  Status:  Discontinued       ? 1,000 mg ?200 mL/hr over 60 Minutes Intravenous 2 times daily 09/27/21 0115 09/28/21 0932  ? ?  ?. ? ?POD/HD#:  ? ?46 y/o MRSA bacteremia, diffuse myofascitis/cellulitis  ? ?- R leg myofascitis, R knee pain, R ankle pain  ? Repeating femur imaging does not show any abscesses but rather fluid consistent with myofasciitis.  ? Mild knee effusion on imaging not really appreciated on exam and exam is not consistent with septic knee joint  ? ? Significant erythema and pain noted to R lower leg  ?  MRI tibia fibula to further evaluate  ? ?  I did not perform knee aspiration today as I think her lower leg need further workup first. Nor does she appear to have a significant knee effusion  ? ? OR today with hand surgery for L wrist abscess  ? ? Doppler yesterday negative for DVT B leg  ? ? ?- Dispo: ? Follow up on MRI R lower leg  ? ? ?Mearl Latin, PA-C ?[phone] (C) ?09/30/2021, 10:16 AM ? ?[facility] ?[address] ?[place] [address] ?[phone] [place]) ?[phone] (F) ? ? ? ?After 5pm and on the weekends please log on to Amion, go to orthopaedics and the look under the [facility] Call for the provider(s) on call. You can also call our office at [phone] and then follow the prompts to be connected to the call team.  ? Patient ID: Jill Duffell, female   DOB: Apr 21, 1976, 46 y.o.   MRN: [id number] ? ?

## 2021-09-30 NOTE — Anesthesia Preprocedure Evaluation (Addendum)
Anesthesia Evaluation  ?Patient identified by MRN, date of birth, ID band ?Patient awake ? ? ? ?Reviewed: ?Allergy & Precautions, NPO status , Patient's Chart, lab work & pertinent test results ? ?History of Anesthesia Complications ?Negative for: history of anesthetic complications ? ?Airway ?Mallampati: II ? ?TM Distance: >3 FB ?Neck ROM: Full ? ? ? Dental ? ?(+) Dental Advisory Given, Partial Upper, Partial Lower ?  ?Pulmonary ?Current Smoker and Patient abstained from smoking.,  ?  ?Pulmonary exam normal ?breath sounds clear to auscultation ? ? ? ? ? ? Cardiovascular ?negative cardio ROS ?Normal cardiovascular exam ?Rhythm:Regular Rate:Normal ? ? ?  ?Neuro/Psych ?PSYCHIATRIC DISORDERS Depression negative neurological ROS ?   ? GI/Hepatic ?negative GI ROS, Neg liver ROS,   ?Endo/Other  ?negative endocrine ROS ? Renal/GU ?negative Renal ROS  ? ?  ?Musculoskeletal ? ?(+) Arthritis , ABSCESS  ? Abdominal ?  ?Peds ? Hematology ? ?(+) Blood dyscrasia, anemia ,   ?Anesthesia Other Findings ?Day of surgery medications reviewed with the patient. ? Reproductive/Obstetrics ? ?  ? ? ? ? ? ? ? ? ? ? ? ? ? ?  ?  ? ? ? ? ? ? ? ?Anesthesia Physical ?Anesthesia Plan ? ?ASA: 2 ? ?Anesthesia Plan: General  ? ?Post-op Pain Management: Tylenol PO (pre-op)*  ? ?Induction: Intravenous ? ?PONV Risk Score and Plan: 3 and Midazolam, Dexamethasone and Ondansetron ? ?Airway Management Planned: LMA ? ?Additional Equipment:  ? ?Intra-op Plan:  ? ?Post-operative Plan: Extubation in OR ? ?Informed Consent: I have reviewed the patients History and Physical, chart, labs and discussed the procedure including the risks, benefits and alternatives for the proposed anesthesia with the patient or authorized representative who has indicated his/her understanding and acceptance.  ? ? ? ?Dental advisory given ? ?Plan Discussed with: CRNA ? ?Anesthesia Plan Comments:   ? ? ? ? ? ?Anesthesia Quick Evaluation ? ?

## 2021-09-30 NOTE — Anesthesia Postprocedure Evaluation (Signed)
Anesthesia Post Note ? ?Patient: Jill Cox ? ?Procedure(s) Performed: IRRIGATION AND DEBRIDEMENT LEFT FOREARM ABSCESS (Left: Arm Lower) ? ?  ? ?Patient location during evaluation: PACU ?Anesthesia Type: General ?Level of consciousness: awake and alert ?Pain management: pain level controlled ?Vital Signs Assessment: post-procedure vital signs reviewed and stable ?Respiratory status: spontaneous breathing, nonlabored ventilation, respiratory function stable and patient connected to nasal cannula oxygen ?Cardiovascular status: blood pressure returned to baseline and stable ?Postop Assessment: no apparent nausea or vomiting ?Anesthetic complications: no ? ? ?No notable events documented. ? ?Last Vitals:  ?Vitals:  ? 09/30/21 1818 09/30/21 1928  ?BP: 117/61 (!) 112/58  ?Pulse: 78 66  ?Resp: 15 15  ?Temp:  36.8 ?C  ?SpO2: 92% 94%  ?  ?Last Pain:  ?Vitals:  ? 09/30/21 2104  ?TempSrc:   ?PainSc: 7   ? ? ?  ?  ?  ?  ?  ?  ? ?Santa Lighter ? ? ? ? ?

## 2021-09-30 NOTE — Consult Note (Signed)
Reason for Consult:Left hand and arm infection ?Referring Physician: Dr. Thereasa Solo ? ?Jill Cox is an 46 y.o. female.  ?HPI: Patient is a right-hand-dominant female who presented to the emergency department at [facility].  Patient was admitted for MRSA bacteremia.  Patient was transferred to [facility] for further care.  I was consulted today for management of the left upper extremity.  Patient had an MRI and CT scan which was concerning for an abscess over the fourth dorsal compartment region. ? ?Past Medical History:  ?Diagnosis Date  ? Depression   ? Engages in [place]   ? ? ?Past Surgical History:  ?Procedure Laterality Date  ? CHOLECYSTECTOMY    ? TEE WITHOUT CARDIOVERSION N/A 09/26/2021  ? Procedure: TRANSESOPHAGEAL ECHOCARDIOGRAM (TEE);  Surgeon: Minna Merritts, MD;  Location: [facility] ORS;  Service: Cardiovascular;  Laterality: N/A;  ? ? ?Family History  ?Problem Relation Age of Onset  ? Heart disease Mother   ? Heart disease Father   ? ? ?Social History:  reports that she has never smoked. She has never used smokeless tobacco. She reports that she does not currently use alcohol. She reports that she does not use drugs. ? ?Allergies: No Known Allergies ? ?Medications: I have reviewed the patient's current medications. ? ?Results for orders placed or performed during the hospital encounter of 09/26/21 (from the past 48 hour(s))  ?Urine Culture     Status: None  ? Collection Time: 09/28/21  5:27 PM  ? Specimen: Urine, Clean Catch  ?Result Value Ref Range  ? Specimen Description URINE, CLEAN CATCH   ? Special Requests NONE   ? Culture    ?  NO GROWTH ?Performed at [facility], [place] [address], [place], [place] [address] ?  ? Report Status 09/30/2021 FINAL   ?Urinalysis, Routine w reflex microscopic Urine, Clean Catch     Status: Abnormal  ? Collection Time: 09/29/21  6:05 AM  ?Result Value Ref Range  ? Color, Urine AMBER (A) YELLOW  ?  Comment: BIOCHEMICALS MAY BE AFFECTED BY COLOR  ? APPearance HAZY (A) CLEAR  ?  Specific Gravity, Urine 1.018 1.005 - 1.030  ? pH 5.0 5.0 - 8.0  ? Glucose, UA NEGATIVE NEGATIVE mg/dL  ? Hgb urine dipstick NEGATIVE NEGATIVE  ? Bilirubin Urine NEGATIVE NEGATIVE  ? Ketones, ur NEGATIVE NEGATIVE mg/dL  ? Protein, ur 30 (A) NEGATIVE mg/dL  ? Nitrite NEGATIVE NEGATIVE  ? Leukocytes,Ua NEGATIVE NEGATIVE  ? RBC / HPF 0-5 0 - 5 RBC/hpf  ? WBC, UA 6-10 0 - 5 WBC/hpf  ? Bacteria, UA RARE (A) NONE SEEN  ? Squamous Epithelial / LPF 0-5 0 - 5  ? Hyaline Casts, UA PRESENT   ? Non Squamous Epithelial 0-5 (A) NONE SEEN  ?  Comment: Performed at [facility], [place] [address] [place], [place] [address]  ?Culture, blood (routine x 2)     Status: None (Preliminary result)  ? Collection Time: 09/29/21  6:25 AM  ? Specimen: BLOOD RIGHT HAND  ?Result Value Ref Range  ? Specimen Description BLOOD RIGHT HAND   ? Special Requests AEROBIC BOTTLE ONLY Blood Culture adequate volume   ? Culture    ?  NO GROWTH 1 DAY ?Performed at [facility], [place] [address], [place], [place] [address] ?  ? Report Status PENDING   ?Culture, blood (routine x 2)     Status: None (Preliminary result)  ? Collection Time: 09/29/21  6:25 AM  ? Specimen: BLOOD  RIGHT HAND  ?Result Value Ref Range  ? Specimen Description BLOOD RIGHT HAND   ? Special Requests    ?  AEROBIC BOTTLE ONLY Blood Culture results may not be optimal due to an excessive volume of blood received in culture bottles  ? Culture    ?  NO GROWTH 1 DAY ?Performed at [facility], [place] [address] [place], [place] [address] ?  ? Report Status PENDING   ?TSH     Status: None  ? Collection Time: 09/29/21  6:25 AM  ?Result Value Ref Range  ? TSH 0.984 0.350 - 4.500 uIU/mL  ?  Comment: Performed by a 3rd Generation assay with a functional sensitivity of <=0.01 uIU/mL. ?Performed at [facility], [place] [address], [place], [place] [address] ?  ?CBC with Differential/Platelet     Status: Abnormal  ? Collection Time: 09/29/21  6:26 AM  ?Result Value Ref Range  ? WBC 14.5  (H) 4.0 - 10.5 K/uL  ? RBC 2.65 (L) 3.87 - 5.11 MIL/uL  ? Hemoglobin 8.2 (L) 12.0 - 15.0 g/dL  ? HCT 24.5 (L) 36.0 - 46.0 %  ? MCV 92.5 80.0 - 100.0 fL  ? MCH 30.9 26.0 - 34.0 pg  ? MCHC 33.5 30.0 - 36.0 g/dL  ? RDW 14.1 11.5 - 15.5 %  ? Platelets 218 150 - 400 K/uL  ? nRBC 0.0 0.0 - 0.2 %  ? Neutrophils Relative % 83 %  ? Neutro Abs 11.9 (H) 1.7 - 7.7 K/uL  ? Lymphocytes Relative 4 %  ? Lymphs Abs 0.6 (L) 0.7 - 4.0 K/uL  ? Monocytes Relative 4 %  ? Monocytes Absolute 0.5 0.1 - 1.0 K/uL  ? Eosinophils Relative 0 %  ? Eosinophils Absolute 0.0 0.0 - 0.5 K/uL  ? Basophils Relative 0 %  ? Basophils Absolute 0.0 0.0 - 0.1 K/uL  ? Immature Granulocytes 9 %  ? Abs Immature Granulocytes 1.35 (H) 0.00 - 0.07 K/uL  ?  Comment: Performed at [facility], [place] [address], [place], [place] [address]  ?Comprehensive metabolic panel     Status: Abnormal  ? Collection Time: 09/29/21  6:26 AM  ?Result Value Ref Range  ? Sodium 136 135 - 145 mmol/L  ? Potassium 3.3 (L) 3.5 - 5.1 mmol/L  ? Chloride 101 98 - 111 mmol/L  ? CO2 26 22 - 32 mmol/L  ? Glucose, Bld 100 (H) 70 - 99 mg/dL  ?  Comment: Glucose reference range applies only to samples taken after fasting for at least 8 hours.  ? BUN 9 6 - 20 mg/dL  ? Creatinine, Ser 0.75 0.44 - 1.00 mg/dL  ? Calcium 7.4 (L) 8.9 - 10.3 mg/dL  ? Total Protein 5.3 (L) 6.5 - 8.1 g/dL  ? Albumin 3.0 (L) 3.5 - 5.0 g/dL  ? AST 34 15 - 41 U/L  ? ALT 28 0 - 44 U/L  ? Alkaline Phosphatase 63 38 - 126 U/L  ? Total Bilirubin 1.6 (H) 0.3 - 1.2 mg/dL  ? GFR, Estimated >60 >60 mL/min  ?  Comment: (NOTE) ?Calculated using the CKD-EPI Creatinine Equation (2021) ?  ? Anion gap 9 5 - 15  ?  Comment: Performed at [facility], [place] [address], [place], [place] [address]  ?Magnesium     Status: None  ? Collection Time: 09/29/21  6:26 AM  ?Result Value Ref Range  ? Magnesium 2.0 1.7 - 2.4 mg/dL  ?  Comment:  Performed at [facility], [place] [address] [place], [place] [address]  ? ? ?CT FOREARM LEFT W  CONTRAST ? ?Result Date: 09/29/2021 ?CLINICAL DATA:  Left forearm infection.  MRSA bacteremia. EXAM: CT OF THE UPPER LEFT EXTREMITY WITH CONTRAST TECHNIQUE: Multidetector CT imaging of the left forearm was performed according to the standard protocol following intravenous contrast administration. RADIATION DOSE REDUCTION: This exam was performed according to the departmental dose-optimization program which includes automated exposure control, adjustment of the mA and/or kV according to patient size and/or use of iterative reconstruction technique. CONTRAST:  122mL OMNIPAQUE IOHEXOL 350 MG/ML SOLN COMPARISON:  MRI left wrist dated September 26, 2021. FINDINGS: Bones/Joint/Cartilage No fracture or dislocation. Joint spaces are preserved. No joint effusion. Ligaments Ligaments are suboptimally evaluated by CT. Muscles and Tendons Prominent fluid within the second, third, and fourth extensor compartment tendon sheaths is similar to recent MRI. There is interfascial fluid within the extensor muscle compartment of the dorsal forearm. No focal intramuscular fluid collection. Soft tissue Scattered soft tissue swelling and skin thickening. 2.6 x 0.8 x 4.2 cm fluid collection overlying the extensor digitorum tendons in the distal forearm, likely not significantly changed allowing for differences in technique. IMPRESSION: 1. Unchanged severe tenosynovitis of the second, third, and fourth extensor compartment tendon sheaths. 2. Interfascial fluid within the extensor muscle compartment of the dorsal forearm concerning for infectious fasciitis. No focal intramuscular fluid collection. 3. 2.6 x 0.8 x 4.2 cm abscess overlying the extensor digitorum tendons in the distal forearm, likely not significantly changed allowing for differences in technique. Electronically Signed   By: Titus Dubin M.D.   On: 09/29/2021 20:25  ? ?CT FEMUR RIGHT W CONTRAST ? ?Result Date: 09/29/2021 ?CLINICAL DATA:  Right thigh infection. EXAM: CT OF THE LOWER  RIGHT EXTREMITY WITH CONTRAST TECHNIQUE: Multidetector CT imaging of the lower right extremity was performed according to the standard protocol following intravenous contrast administration. RADIATION DO

## 2021-09-30 NOTE — Progress Notes (Signed)
? Jill Cox  OHY:[id number] DOB: 1976-05-19 DOA: 09/26/2021 ?PCP: Cleon Dew, FNP   ? ?Brief Narrative:  ?46 year old with a history of depression who was admitted to [facility] 09/26/2021 on transfer from [facility] with known MRSA bacteremia in the setting of a thoracic epidural abscess with concern for possible multiple septic joints.  She was originally admitted to [facility] 09/24/2021 complaining of polyarthralgia.  She had been seen for polyarthralgia in the [facility] ED 09/23/2021 at which time she was given Keflex for "cellulitis" and sent home.  Blood cultures ultimately grew MRSA and the patient was contacted to return to the ED.  Upon her return she was admitted and placed on IV vancomycin.  Repeat blood cultures were again positive for MRSA.  MRI of bilateral knees demonstrated moderate to large effusions worrisome for septic arthritis.  Right ankle MRI demonstrated effusion possibly septic in nature.  MRIs of bilateral wrists suggested tenosynovitis without overt septic arthritis.  Subsequent MRI of the thoracic spine noted an epidural abscess at T2/T3.  TEE was accomplished with no evidence of bacterial endocarditis. ? ?Consultants:  ?Neurosurgery ?Orthopedics ?ID ? ?Code Status: FULL CODE ? ?DVT prophylaxis: ?Lovenox ? ?Interim Hx: ?Ortho suggested a Hand Surgery consultation base on CT of the L arm/wrist 3/17 suggesting abscess of forearm, and exam which is failing to improve. Pt reports ongoing diffuse arthralgia, worse in R knee and L wrist. She denies cp, n/v, or abdom pain.  ? ?Assessment & Plan: ? ?MRSA bacteremia -cervical and T2/T3 epidural abscess ?Initial blood cultures + 09/23/2021 - no evidence of endocarditis on TEE - repeat blood cultures 09/27/2021 positive -ID directing antibiotic therapy -changed from IV vancomycin to IV daptomycin and teflaro due to persisting fevers -neurosurgery consulted -further imaging raises concern for possible phlegmon at the L5 vertebral body and L4/L5 discitis -no  surgical intervention currently suggested by neurosurgery ? ?Bilateral wrist tenosynovitis ?Initially noted on MRI at [facility] but not felt to represent frank septic arthritis initially - Hand Surgery consulted today as f/u CT 3/17 suggest abcess of L wrist/forearm - to OR today for I&D ? ?Possible bilateral septic knee arthritis ?Initial concern raised by MRI accomplished to [facility] -reassessment by Dr. Jena Gauss at [facility] with surgical intervention not felt to be required presently ? ?Depression ?Continue Zoloft ? ?Anasarca -hypoalbuminemia ?Normal EF on TEE -may simply be due to low albumin -venous duplex to rule out DVT negative B LE  ? ?Anemia ?No evidence of blood loss -likely due to bacteremia/smoldering infection ? ?Hypokalemia ? ?Family Communication: Spoke with husband at bedside ?Disposition: From home -hopeful for eventual return home but not yet medically stable ? ?Objective: ?Blood pressure 134/60, pulse 100, temperature (!) 100.9 ?F (38.3 ?C), temperature source Oral, resp. rate 19, height 5\' 6"  (1.676 m), weight 82 kg, SpO2 99 %. ?No intake or output data in the 24 hours ending 09/30/21 0957 ?Filed Weights  ? 09/26/21 2336 09/30/21 0500  ?Weight: 81.7 kg 82 kg  ? ? ?Examination: ?General: No acute respiratory distress ?Lungs: Clear to auscultation bilaterally without wheezes or crackles ?Cardiovascular: Regular rate and rhythm without murmur gallop or rub normal S1 and S2 ?Abdomen: Nontender, nondistended, soft, bowel sounds positive, no rebound, no ascites, no appreciable mass ?Extremities: Significant erythema of the left wrist and forearm -significant erythema of right knee and posterior aspect of right lower extremity with no draining wound -2+ edema bilateral lower extremities and 1+ edema bilateral upper extremities ? ?CBC: ?Recent Labs  ?Lab 09/27/21 ?09/29/21 09/28/21 ?0018  09/29/21 ?9242  ?WBC 14.2* 14.9* 14.5*  ?NEUTROABS 13.1* 11.4* 11.9*  ?HGB 10.3* 9.0* 8.2*  ?HCT 31.2* 27.4* 24.5*  ?MCV 92.9 93.5  92.5  ?PLT 205 199 218  ? ?Basic Metabolic Panel: ?Recent Labs  ?Lab 09/27/21 ?6834 09/28/21 ?0018 09/29/21 ?1962  ?NA 136 133* 136  ?K 3.8 3.4* 3.3*  ?CL 102 99 101  ?CO2 24 25 26   ?GLUCOSE 107* 152* 100*  ?BUN 12 10 9   ?CREATININE 0.67 0.66 0.75  ?CALCIUM 7.3* 7.3* 7.4*  ?MG 2.0 2.1 2.0  ? ?GFR: ?Estimated Creatinine Clearance: 93.9 mL/min (by C-G formula based on SCr of 0.75 mg/dL). ? ?Liver Function Tests: ?Recent Labs  ?Lab 09/23/21 ?1207 09/24/21 ?1036 09/27/21 ?11/24/21 09/29/21 ?2297  ?AST 54* 51* 28 34  ?ALT 59* 50* 32 28  ?ALKPHOS 121 119 84 63  ?BILITOT 1.2 1.1 1.0 1.6*  ?PROT 6.5 6.1* 4.6* 5.3*  ?ALBUMIN 2.7* 2.4* <1.5* 3.0*  ? ? ?Coagulation Profile: ?Recent Labs  ?Lab 09/24/21 ?1036  ?INR 1.1  ? ? ?Cardiac Enzymes: ?Recent Labs  ?Lab 09/23/21 ?1207 09/24/21 ?1036 09/26/21 ?0446  ?CKTOTAL 267* 149 91  ? ? ?Scheduled Meds: ? enoxaparin (LOVENOX) injection  40 mg Subcutaneous Q24H  ? feeding supplement  237 mL Oral BID BM  ? ferrous sulfate  325 mg Oral Q breakfast  ? furosemide  40 mg Intravenous Q12H  ? lidocaine (PF)  30 mL Intradermal STAT  ? polyethylene glycol  17 g Oral Daily  ? senna-docusate  1 tablet Oral BID  ? sertraline  25 mg Oral q morning  ? tobramycin  40 mg Intramuscular Once  ? ?Continuous Infusions: ? ceFTAROline (TEFLARO) IV 600 mg (09/30/21 09/28/21)  ? DAPTOmycin (CUBICIN)  IV 650 mg (09/29/21 2043)  ? ? ? LOS: 4 days  ? ?10/01/21, MD ?[facility] ?Office  [phone] ?Pager - Text Page per Lonia Blood ? ?If 7PM-7AM, please contact night-coverage per Amion ?09/30/2021, 9:57 AM ? ? ? ? ? ?

## 2021-09-30 NOTE — Progress Notes (Signed)
Patient ID: Jill Cox, female   DOB: January 29, 1976, 46 y.o.   MRN: [id number] ?Awake alert ?Notes that most of pain and lower extremity joints today ?Back is not bothering her much ?Motor function appears to be remaining intact in her lower extremities ?Continues IV antibiotics. ?

## 2021-09-30 NOTE — Op Note (Signed)
PREOPERATIVE DIAGNOSIS: Left hand and forearm dorsal abscess ? ?POSTOPERATIVE DIAGNOSIS: Same ? ?ATTENDING SURGEON: Dr. Bradly Bienenstock who scrubbed and present for the entire procedure ? ?ASSISTANT SURGEON: None ? ?ANESTHESIA: General via LMA ? ?OPERATIVE PROCEDURE: ?Left forearm incision and drainage abscess ?Left forearm fourth dorsal compartment extensor tenosynovectomy ? ?IMPLANTS: None ? ?EBL: Minimal ? ?RADIOGRAPHIC INTERPRETATION: None ? ?SURGICAL INDICATIONS: Patient is a right-hand-dominant female who was admitted for MRSA bacteremia.  Patient had the persistent swelling and pain over the dorsal aspect of the left hand and forearm.  Was recommended to undergo surgical intervention for the dorsal abscess collection.  Risk benefits and alternatives discussed in detail with the patient and signed informed consent obtained the day of surgery. ? ?SURGICAL TECHNIQUE: The patient was prepped identified in the preoperative holding area marked apart a marker made in the left forearm indicate correct operative site.  Patient brought back operating placed supine on anesthesia table where the general anesthetic was administered.  Patient tolerates well.  A well-padded tourniquet placed on the left brachium and stay with the appropriate drape.  Left upper extremities then prepped and draped in normal sterile fashion.  A timeout was called the correct site was identified and the procedure then begun.  Attention was then turned to the left forearm.  The limb was then elevated and tourniquet insufflated.  A longitudinal incision made directly over the dorsal aspect of the fourth compartment.  Dissection carried down through the skin and subcutaneous tissue where there is a small amount of fluid over the dorsal aspect of the hand and distal forearm.  It did not appear to be grossly purulent.  The fourth dorsal compartment was then opened up.  The patient did have a moderate amount of fluid within the fourth dorsal  compartment.  Drainage of the fourth dorsal compartment was then carried out of the deep space.  This fluid was sent for culture.  Extensor tenosynovectomy was then carried out of the fourth dorsal compartment and tissue was also sent for culture.  Aggressive tenosynovectomy was then done of the fourth dorsal compartment.  It was not grossly purulent.  Following extensive tenosynovectomy and drainage thorough wound irrigation was then done of the surgical region.  Following this the wound was then loosely reapproximated and closed with simple Prolene suture.  Adaptic dressing a sterile compressive bandage then applied.  The patient was placed in a short arm volar splint extubated taken recovery in good condition. ? ?POSTOPERATIVE PLAN: Patient be admitted back to the internal medicine service.  Await the cultures.  There were tissue culture sent as well as fluid cultures.  The patient has been on IV antibiotics.  We will continue to follow the patient as an inpatient.  Once again it did not look grossly infected did not look like a typical MRSA infection.  The patient has been on IV antibiotics but still did not look like a horrible soft tissue infection. ?

## 2021-10-01 ENCOUNTER — Inpatient Hospital Stay (HOSPITAL_COMMUNITY): Payer: 59

## 2021-10-01 ENCOUNTER — Encounter (HOSPITAL_COMMUNITY): Payer: Self-pay | Admitting: Orthopedic Surgery

## 2021-10-01 DIAGNOSIS — R7881 Bacteremia: Secondary | ICD-10-CM | POA: Diagnosis not present

## 2021-10-01 DIAGNOSIS — B9562 Methicillin resistant Staphylococcus aureus infection as the cause of diseases classified elsewhere: Secondary | ICD-10-CM | POA: Diagnosis not present

## 2021-10-01 LAB — COMPREHENSIVE METABOLIC PANEL
ALT: 26 U/L (ref 0–44)
AST: 25 U/L (ref 15–41)
Albumin: 2.3 g/dL — ABNORMAL LOW (ref 3.5–5.0)
Alkaline Phosphatase: 68 U/L (ref 38–126)
Anion gap: 10 (ref 5–15)
BUN: 12 mg/dL (ref 6–20)
CO2: 27 mmol/L (ref 22–32)
Calcium: 7.8 mg/dL — ABNORMAL LOW (ref 8.9–10.3)
Chloride: 100 mmol/L (ref 98–111)
Creatinine, Ser: 0.62 mg/dL (ref 0.44–1.00)
GFR, Estimated: >60 mL/min (ref >60)
Glucose, Bld: 194 mg/dL — ABNORMAL HIGH (ref 70–99)
Potassium: 3.9 mmol/L (ref 3.5–5.1)
Sodium: 137 mmol/L (ref 135–145)
Total Bilirubin: 0.8 mg/dL (ref 0.3–1.2)
Total Protein: 5.6 g/dL — ABNORMAL LOW (ref 6.5–8.1)

## 2021-10-01 LAB — MAGNESIUM: Magnesium: 2.3 mg/dL (ref 1.7–2.4)

## 2021-10-01 LAB — SEDIMENTATION RATE: Sed Rate: 71 mm/hr — ABNORMAL HIGH (ref 0–22)

## 2021-10-01 LAB — C-REACTIVE PROTEIN: CRP: 19.3 mg/dL — ABNORMAL HIGH (ref <1.0)

## 2021-10-01 LAB — PHOSPHORUS: Phosphorus: 3 mg/dL (ref 2.5–4.6)

## 2021-10-01 IMAGING — MR MR [PERSON_NAME] LOW WO/W CM*L*
4 of 8 series · 11 of 40 positions shown · IV contrast (gadavist)
Comparison: None.

CLINICAL DATA: Left lower leg infection.  MRSA bacteremia.

EXAM:
MRI OF LOWER LEFT EXTREMITY WITHOUT AND WITH CONTRAST
TECHNIQUE: Multiplanar, multisequence MR imaging of the left lower leg was
performed both before and after administration of intravenous
contrast.
CONTRAST:  8.2mL GADAVIST GADOBUTROL 1 MMOL/ML IV SOLN

[Series 13: T1 · coronal · 4.0mm · 0.41mm/px · 3 of 30 slices shown (1 of 2)]
[im 1/30]
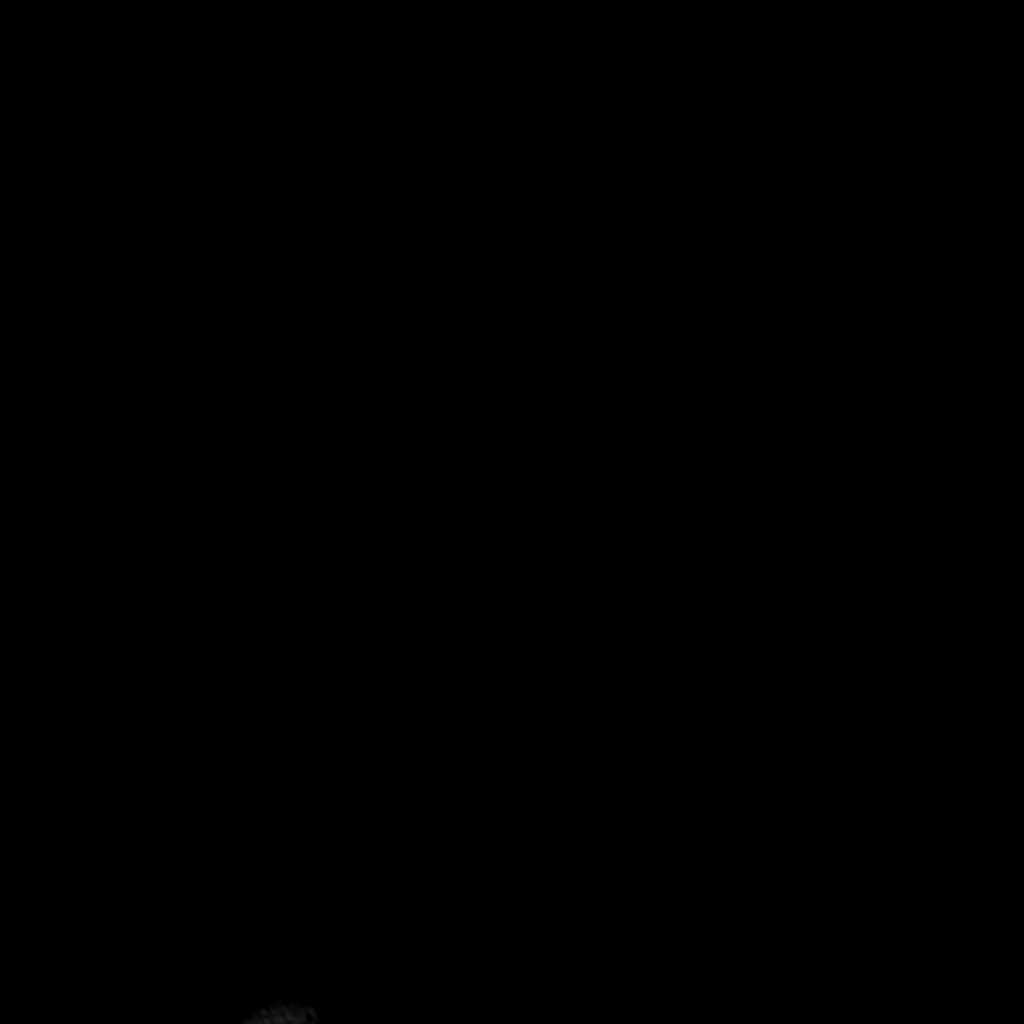
[im 15/30]
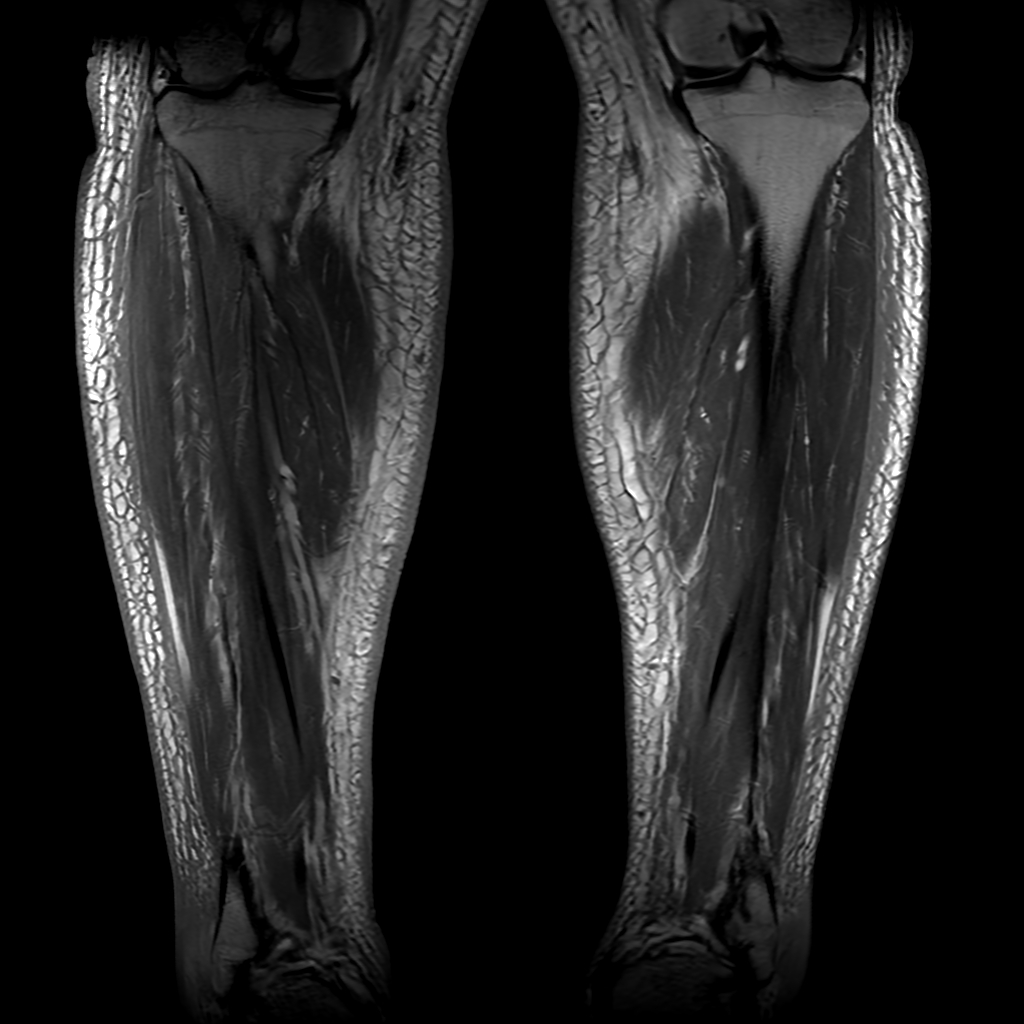
[im 30/30]
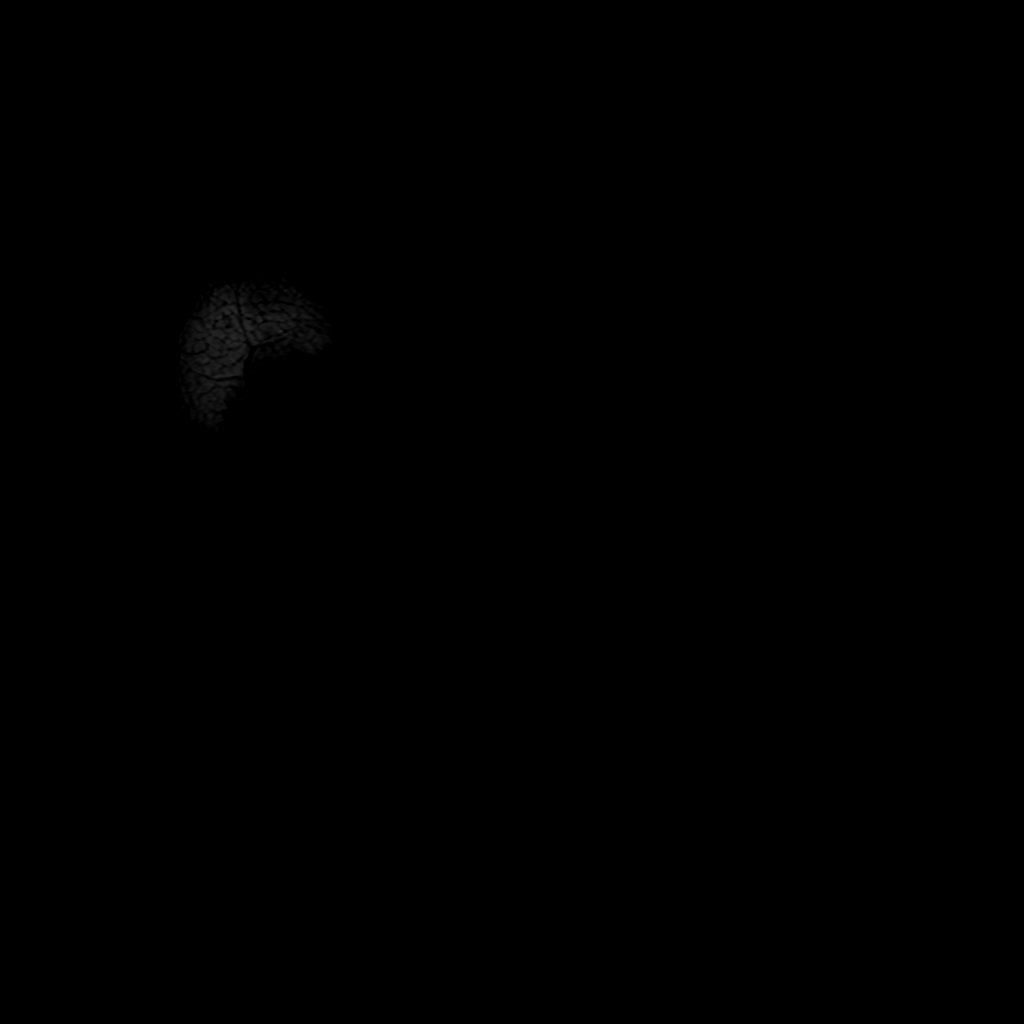

[Series 15: T1 · axial · 4.0mm · 0.51mm/px · z∈[-496,-232]mm · 3 of 82 slices shown (2 of 2)]
[im 14/82]
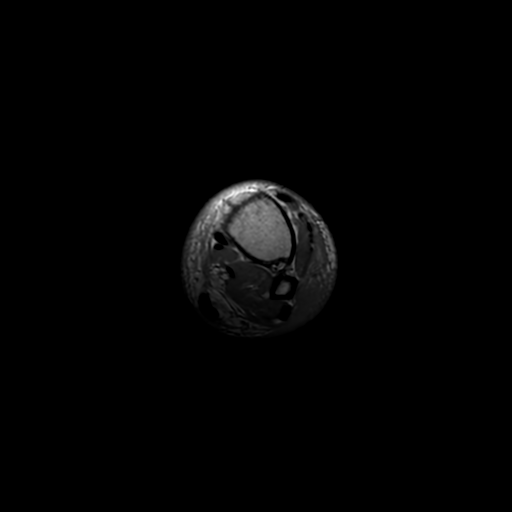
[im 41/82]
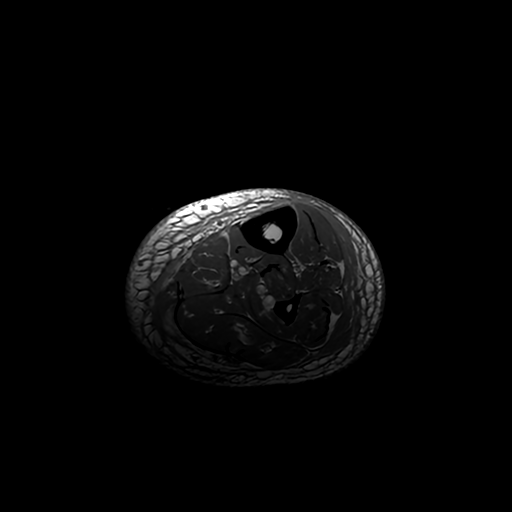
[im 68/82]
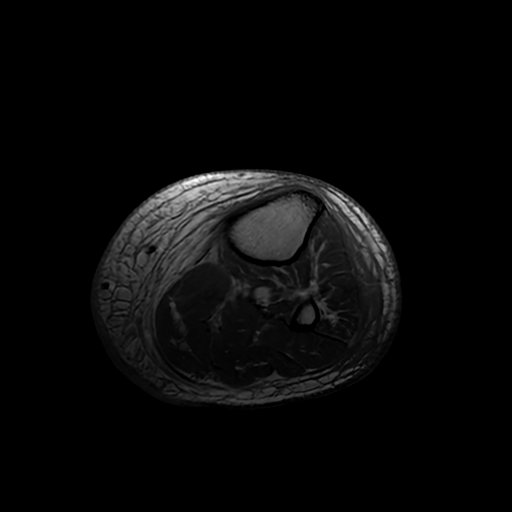

[Series 16: T2 fat-sat · axial · 4.0mm · 0.51mm/px · z∈[-496,-232]mm · 3 of 82 slices shown]
[im 14/82]
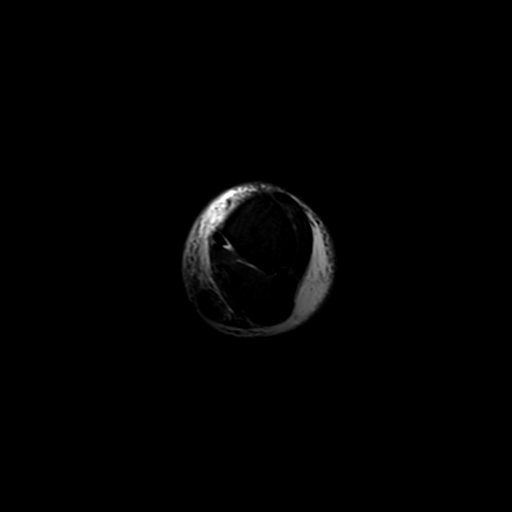
[im 41/82]
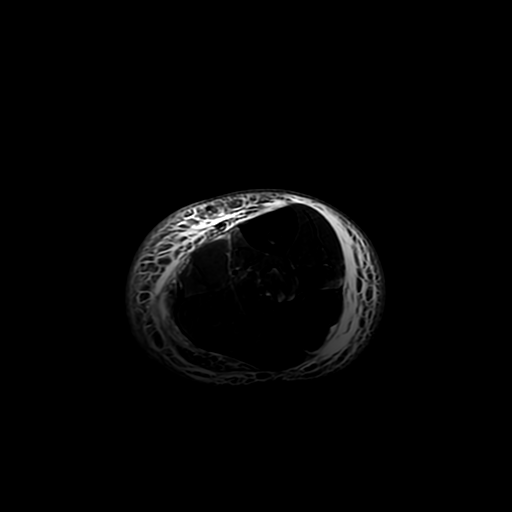
[im 68/82]
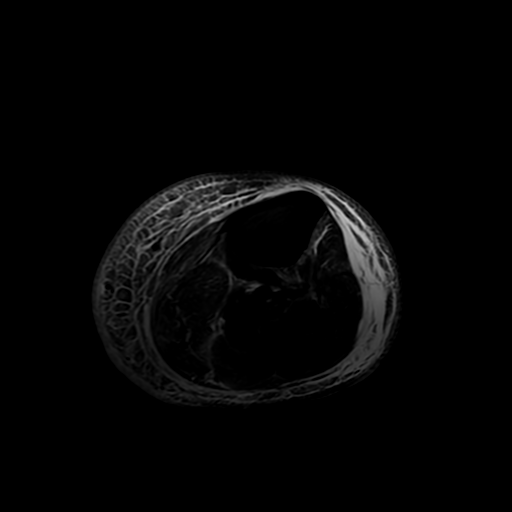

[Series 18: T1 fat-sat · axial · 4.0mm · 0.47mm/px · z∈[-495,-363]mm · 2 of 82 slices shown]
[im 14/82]
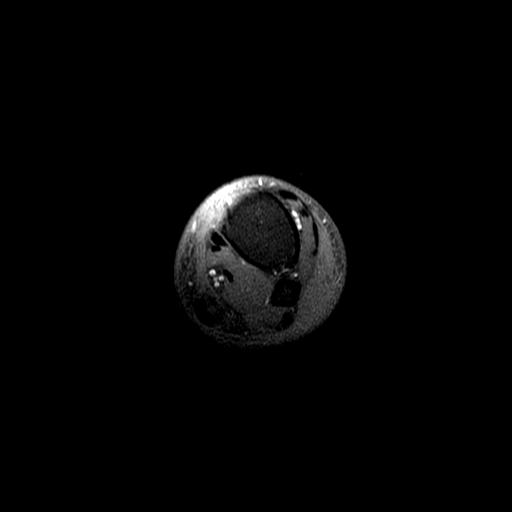
[im 41/82]
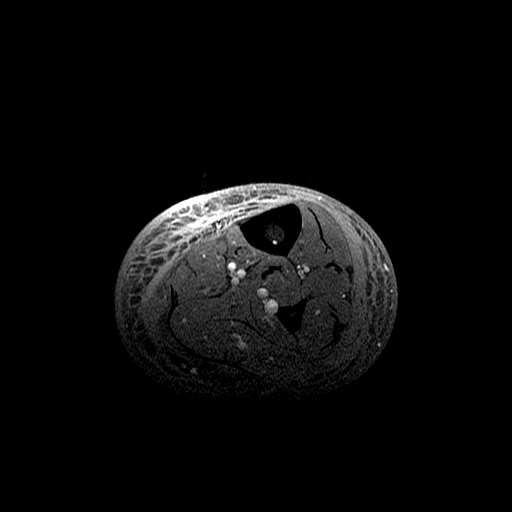

[11 of 40 positions shown; findings below may reference images not displayed]

FINDINGS: Bones/Joint/Cartilage

No marrow signal abnormality. No fracture or dislocation.

Muscles and Tendons
Minimal edema within the medial gastrocnemius muscle with trace
interfascial fluid between the gastrocnemius and soleus muscles.
Mild edema in the proximal anterior and posterior tibialis muscles.
No intramuscular fluid collection.

Soft tissue
Severe circumferential soft tissue swelling. No fluid collection. No
soft tissue mass.
IMPRESSION: 1. Very mild edema within the medial gastrocnemius muscle and
proximal anterior and posterior tibialis muscles could reflect
myositis, but there is no evidence of significant infectious
myofasciitis compared to the right lower leg and both thighs. No
deep soft tissue abscess.
2. Severe circumferential soft tissue swelling of the left lower
leg, sugestive of cellulitis.

## 2021-10-01 IMAGING — MR MR FEMUR*L* WO/W CM
4 of 8 series · 11 of 40 positions shown · IV contrast (YES GAD)
Comparison: MRI left femur dated [DATE].

CLINICAL DATA: Left thigh infection follow-up.  MRSA bacteremia.

EXAM:
MR OF THE LEFT LOWER EXTREMITY WITHOUT AND WITH CONTRAST
TECHNIQUE: Multiplanar, multisequence MR imaging of the left thigh was
performed both before and after administration of intravenous
contrast.
CONTRAST:  8.2mL GADAVIST GADOBUTROL 1 MMOL/ML IV SOLN

[Series 5: T1 · coronal · 4.0mm · 0.43mm/px · 3 of 45 slices shown (1 of 2)]
[im 1/45]
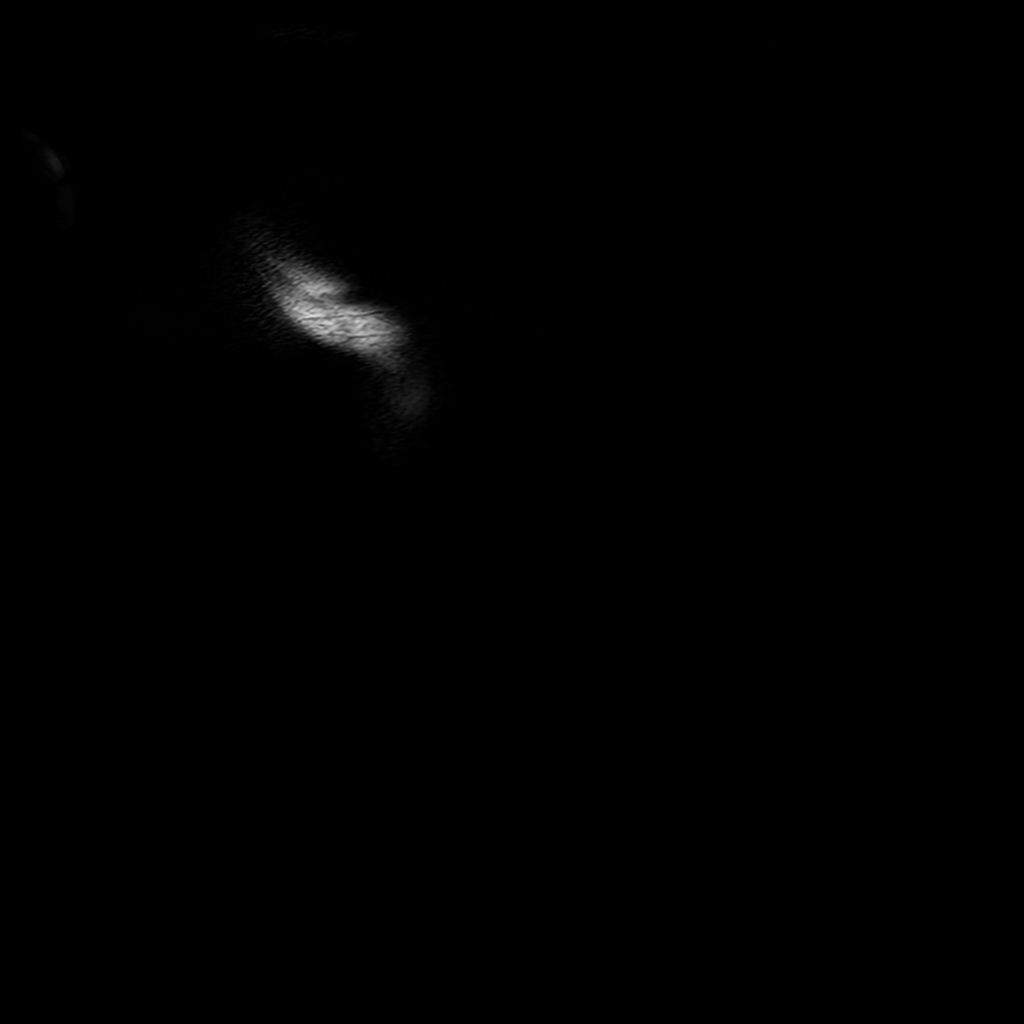
[im 23/45]
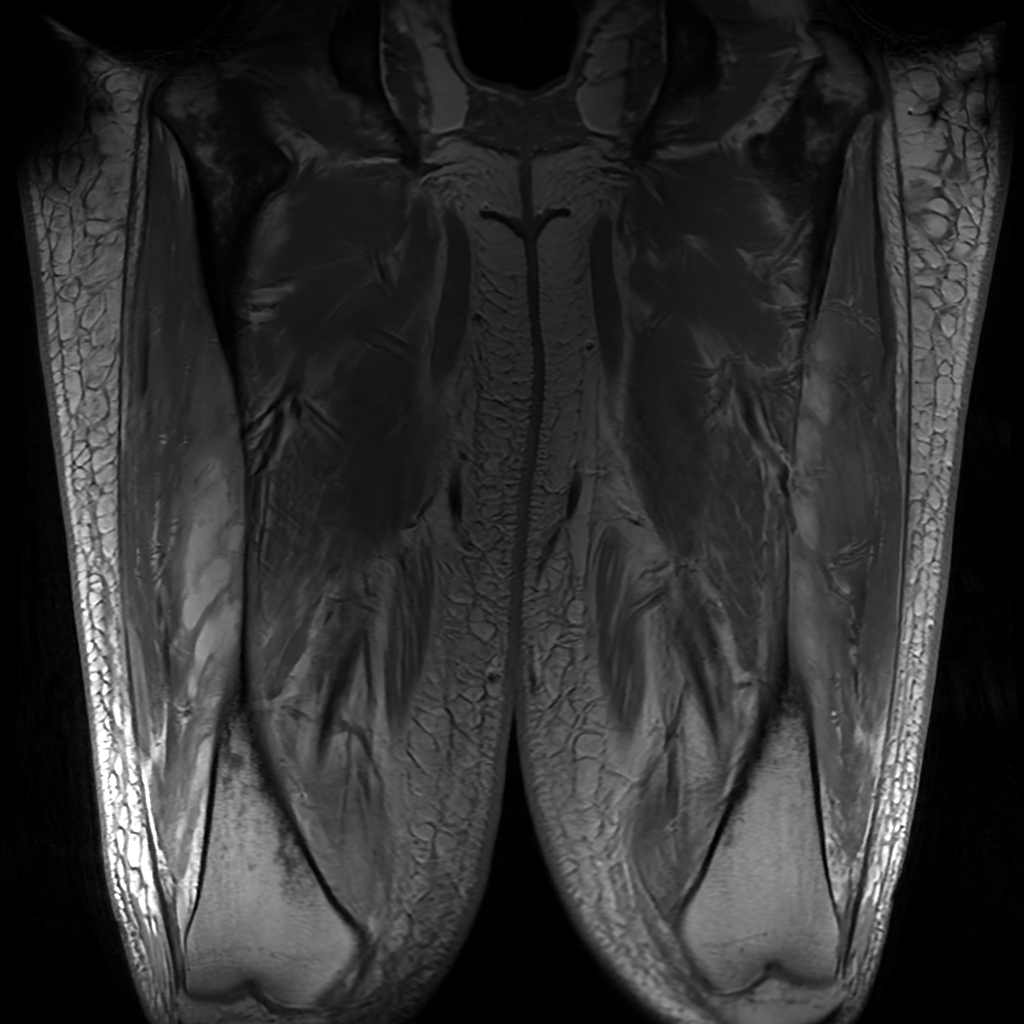
[im 45/45]
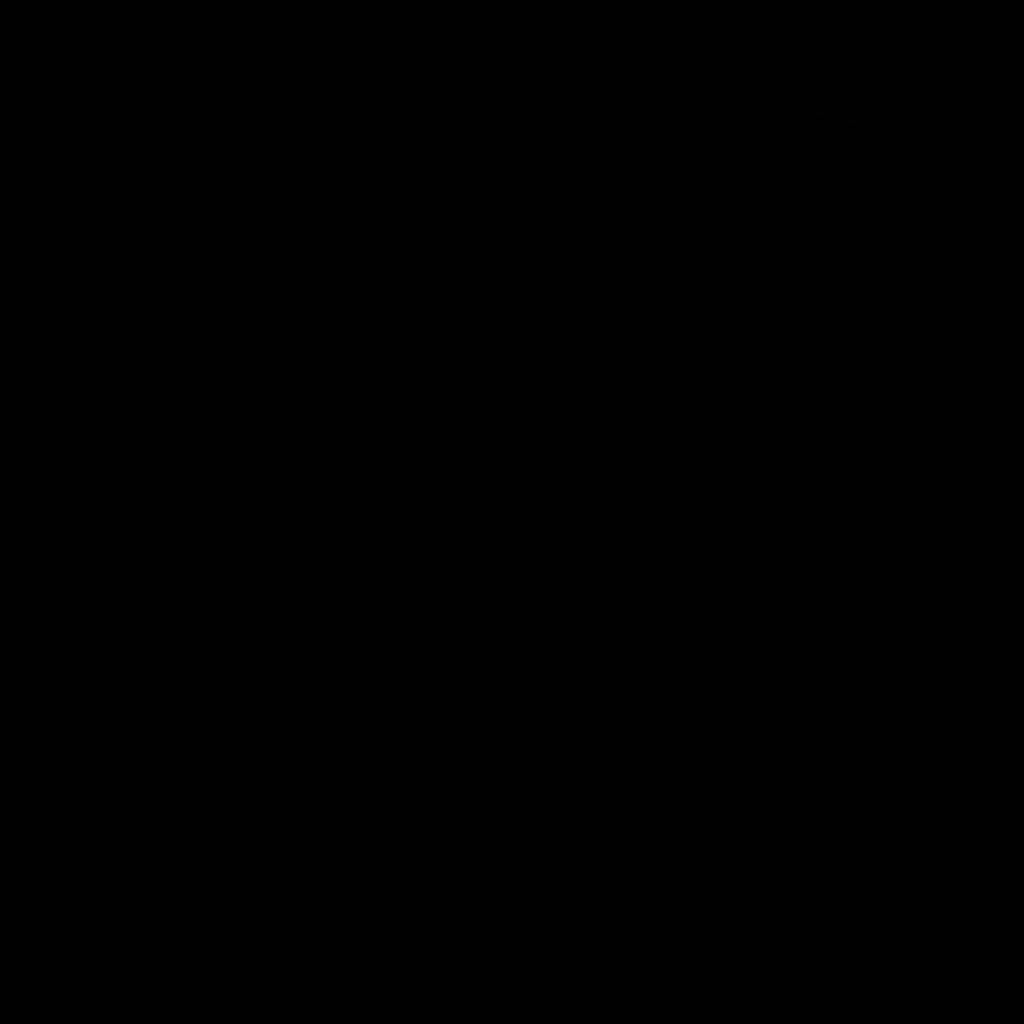

[Series 6: T1 · axial · 4.0mm · 0.51mm/px · z∈[-83,+233]mm · 3 of 98 slices shown (2 of 2)]
[im 17/98]
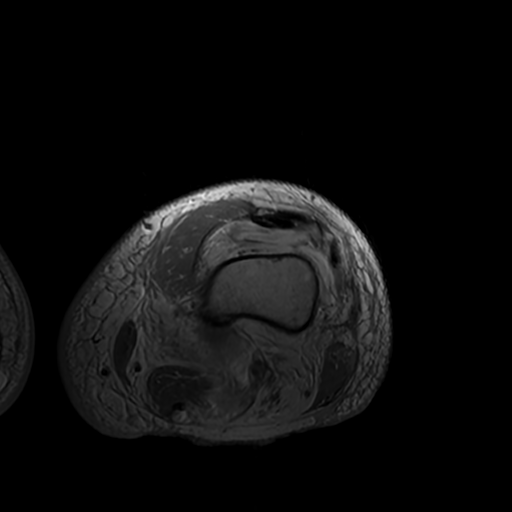
[im 49/98]
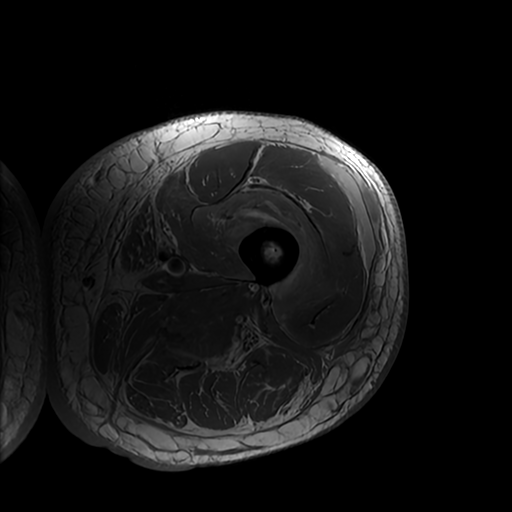
[im 81/98]
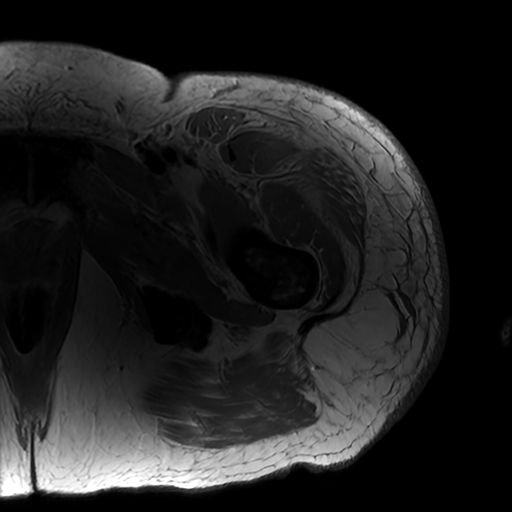

[Series 7: T2 fat-sat · axial · 4.0mm · 0.51mm/px · z∈[-83,+233]mm · 3 of 98 slices shown]
[im 17/98]
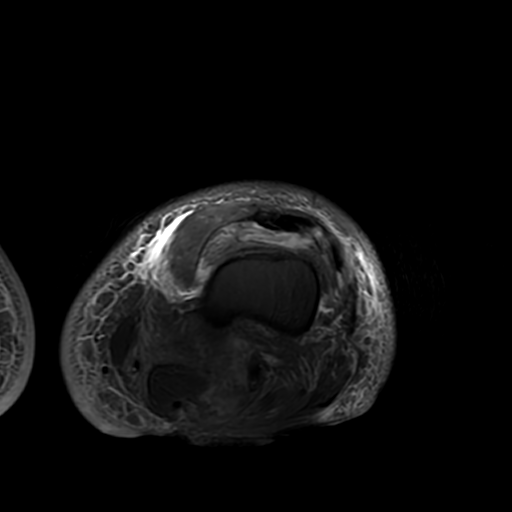
[im 49/98]
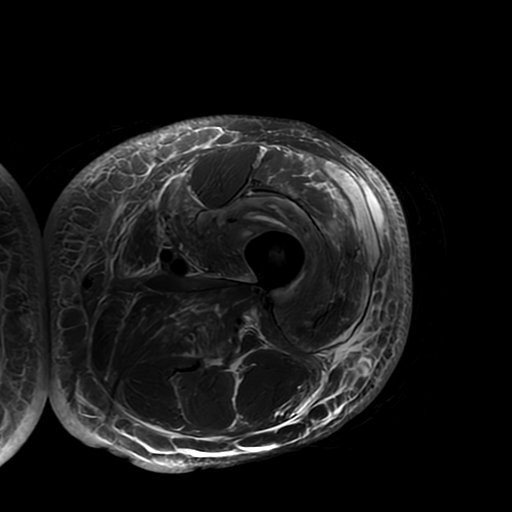
[im 81/98]
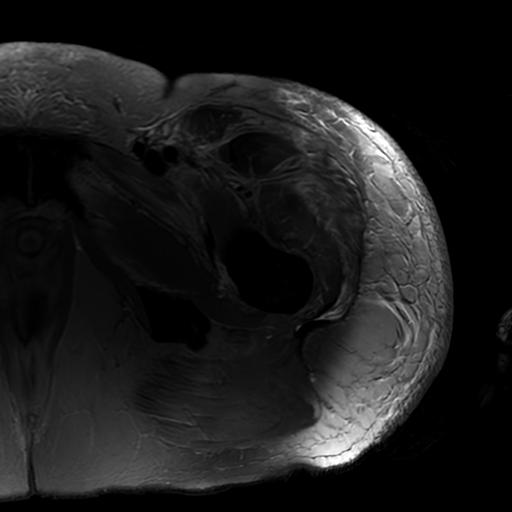

[Series 9: T1 fat-sat · axial · 4.0mm · 0.51mm/px · z∈[-83,+75]mm · 2 of 98 slices shown]
[im 17/98]
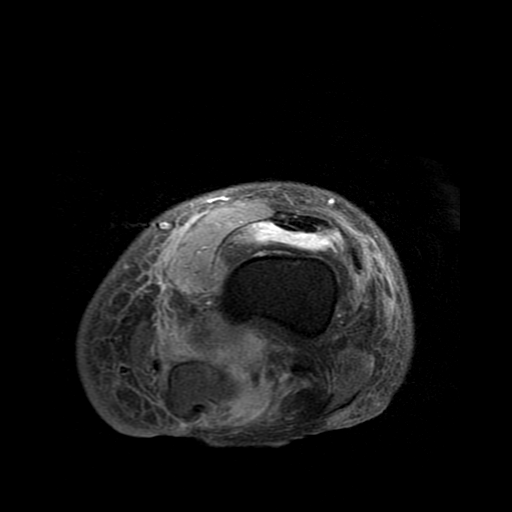
[im 49/98]
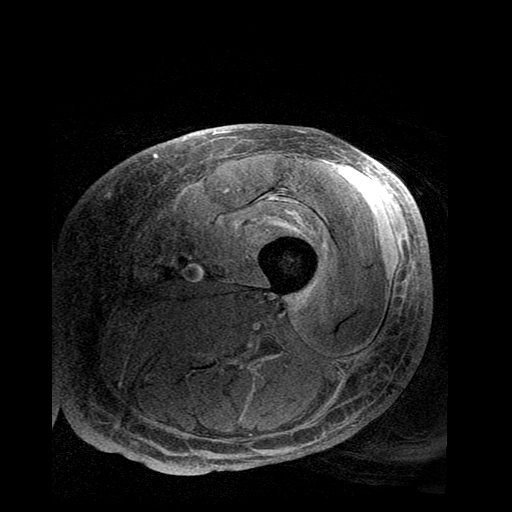

[11 of 40 positions shown; findings below may reference images not displayed]

FINDINGS: Bones/Joint/Cartilage

No marrow signal abnormality. No fracture or dislocation. Joint
spaces are preserved. Decreased now moderate left knee joint
effusion.

Muscles and Tendons
Vastus intermedius muscle edema and multiple small intramuscular
fluid collections are stable to minimally improved compared to prior
study. Improved edema in the rectus femoris muscle. Similar edema in
the sartorius, vastus medialis, and vastus lateralis muscles. New
5.8 x 0.7 x 16.2 crescentic intrinsically T1 hyperintense fluid
collection between the superficial fascia and vastus lateralis
muscle (series 7, image 51). Unchanged small amount of interfascial
fluid in the medial and posterior muscle compartments with mild
edema in the adductor magnus muscle.

Grossly unchanged extensive infectious myofasciitis of the right
thigh with multiple small intramuscular abscesses.

Soft tissue
Similar severe soft tissue swelling both thighs. No soft tissue
mass.
IMPRESSION: 1. New 5.8 x 0.7 x 16.2 left thigh abscess between the superficial
fascia and vastus lateralis muscle.
2. Otherwise stable to minimally improved extensive infectious
myofasciitis of the left thigh.
3. Grossly unchanged extensive infectious myofasciitis of the right
thigh.
4. Decreased now moderate left knee joint effusion, still concerning
for septic arthritis.

## 2021-10-01 MED ORDER — GADOBUTROL 1 MMOL/ML IV SOLN
10.0000 mL | Freq: Once | INTRAVENOUS | Status: AC | PRN
  Administered 2021-10-01: 10 mL via INTRAVENOUS

## 2021-10-01 MED ORDER — GADOBUTROL 1 MMOL/ML IV SOLN
8.2000 mL | Freq: Once | INTRAVENOUS | Status: AC | PRN
  Administered 2021-10-01: 8.2 mL via INTRAVENOUS

## 2021-10-01 NOTE — Progress Notes (Signed)
PT Cancellation Note ? ?Patient Details ?Name: Jill Cox ?MRN: [id number] ?DOB: 11-17-1975 ? ? ?Cancelled Treatment:    Reason Eval/Treat Not Completed: Patient at procedure or test/unavailable. Pt with OT, then transport arrived to take pt to MRI. PT to re-attempted as time allows. Noted plan for OR tomorrow. ? ? ?Lorriane Shire ?10/01/2021, 2:53 PM ? ?Lorrin Goodell, PT  ?Office # [phone] ?Pager [phone] ? ?

## 2021-10-01 NOTE — Progress Notes (Signed)
? Jill Cox  [id number] DOB: Dec 18, 1975 DOA: 09/26/2021 ?PCP: Bubba Camp, [place]   ? ?Brief Narrative:  ?46 year old with a history of depression who was admitted to [facility] 09/26/2021 on transfer from [facility] with known MRSA bacteremia in the setting of a thoracic epidural abscess with concern for possible multiple septic joints.  She was originally admitted to [facility] 09/24/2021 complaining of polyarthralgia.  She had been seen for polyarthralgia in the [facility] ED 09/23/2021 at which time she was given Keflex for "cellulitis" and sent home.  Blood cultures ultimately grew MRSA and the patient was contacted to return to the ED.  Upon her return she was admitted and placed on IV vancomycin.  Repeat blood cultures were again positive for MRSA.  MRI of bilateral knees demonstrated moderate to large effusions worrisome for septic arthritis.  Right ankle MRI demonstrated effusion possibly septic in nature.  MRIs of bilateral wrists suggested tenosynovitis without overt septic arthritis.  Subsequent MRI of the thoracic spine noted an epidural abscess at T2/T3.  TEE was accomplished with no evidence of bacterial endocarditis. ? ?Significant Events: ?3/18 left forearm I&D with fourth dorsal compartment extensor tenosynovectomy ? ?Consultants:  ?Neurosurgery ?Orthopedics ?ID ? ?Code Status: FULL CODE ? ?DVT prophylaxis: ?Lovenox ? ?Interim Hx: ?Was taken to the OR for left wrist/forearm I&D yesterday afternoon.  No acute complications.  Afebrile.  Vital signs stable.  No acute events reported overnight.  MRI of the right leg was accomplished this morning and raises questions of possible abscesses. No new complaints today. Reports decreased pain in L wrist post I&D. Ongoing pain in R knee, R calf, and L thigh.  ? ?Assessment & Plan: ? ?MRSA bacteremia -cervical and T2/T3 epidural abscess ?Initial blood cultures + 09/23/2021 - no evidence of endocarditis on TEE - repeat blood cultures 09/27/2021 positive -ID directing  antibiotic therapy -changed from IV vancomycin to IV daptomycin and teflaro due to persisting fevers - Neurosurgery consulted - further imaging raises concern for possible phlegmon at the L5 vertebral body and L4/L5 discitis - no surgical intervention currently suggested by Neurosurgery ? ?Bilateral wrist tenosynovitis ?Initially noted on MRI at [facility] but not felt to represent frank septic arthritis initially - Hand Surgery consulted 3/18 as CT 3/17 suggested abcess of L wrist/forearm - I&D of L wrist/forearm in OR 3/18 w/o frank pus encountered - cultures pending - less pain in B wrists on exam today  ? ?Possible bilateral septic knee arthritis - possible R gastroc and quad abscesses ?Initial concern raised by MRI accomplished at [place] following - MRI R LE 3/19 suggests possible abscesses and probable septic arthritis of R knee - to OR 3/20 for I&D and exploration  ? ?Depression ?Continue Zoloft  -discuss increasing dose as pt facing situational acute worsening of her sx  ? ?Anasarca - hypoalbuminemia ?Normal EF on TEE - venous duplex to rule out DVT negative B LE  ? ?Anemia ?No evidence of blood loss -likely due to bacteremia/smoldering infection ? ?Hypokalemia ?Corrected with supplementation ? ?Family Communication: Spoke with family at bedside ?Disposition: From home -hopeful for eventual return home but not yet medically stable ? ?Objective: ?Blood pressure 124/73, pulse 72, temperature (!) 97.5 ?F (36.4 ?C), temperature source Oral, resp. rate 15, height 5' 5.98" (1.676 m), weight 82 kg, SpO2 96 %. ? ?Intake/Output Summary (Last 24 hours) at 10/01/2021 0935 ?Last data filed at 10/01/2021 [id number] ?Gross per 24 hour  ?Intake 1302.96 ml  ?Output 510 ml  ?Net 792.96 ml  ? ?  Filed Weights  ? 09/26/21 2336 09/30/21 0500 09/30/21 1548  ?Weight: 81.7 kg 82 kg 82 kg  ? ? ?Examination: ?General: No acute respiratory distress ?Lungs: Clear to auscultation bilaterally  ?Cardiovascular: Regular rate and rhythm without  murmur  ?Abdomen: Nontender, nondistended, soft, bowel sounds positive ?Extremities: Left wrist and forearm dressed -right wrist with decreased erythema compared to exam yesterday -bilateral lower extremity swollen with tenderness of right calf with associated overlying erythema and erythema of posterior right thigh as well as medial and posterior left thigh ? ?CBC: ?Recent Labs  ?Lab 09/28/21 ?0018 09/29/21 ?[id number] 09/30/21 ?1449  ?WBC 14.9* 14.5* 20.4*  ?NEUTROABS 11.4* 11.9* 16.8*  ?HGB 9.0* 8.2* 9.8*  ?HCT 27.4* 24.5* 29.8*  ?MCV 93.5 92.5 93.1  ?PLT 199 218 419*  ? ? ?Basic Metabolic Panel: ?Recent Labs  ?Lab 09/28/21 ?0018 09/29/21 ?[id number] 09/30/21 ?1449 10/01/21 ?[id number]  ?NA 133* 136 137 137  ?K 3.4* 3.3* 3.5 3.9  ?CL 99 101 100 100  ?CO2 25 26 27 27   ?GLUCOSE 152* 100* 111* 194*  ?BUN 10 9 11 12   ?CREATININE 0.66 0.75 0.82 0.62  ?CALCIUM 7.3* 7.4* 7.6* 7.8*  ?MG 2.1 2.0  --  2.3  ?PHOS  --   --   --  3.0  ? ? ?GFR: ?Estimated Creatinine Clearance: 93.9 mL/min (by C-G formula based on SCr of 0.62 mg/dL). ? ?Liver Function Tests: ?Recent Labs  ?Lab 09/24/21 ?1036 09/27/21 ?[id number] 09/29/21 ?[id number] 10/01/21 ?[id number]  ?AST 51* 28 34 25  ?ALT 50* 32 28 26  ?ALKPHOS 119 84 63 68  ?BILITOT 1.1 1.0 1.6* 0.8  ?PROT 6.1* 4.6* 5.3* 5.6*  ?ALBUMIN 2.4* <1.5* 3.0* 2.3*  ? ? ? ?Coagulation Profile: ?Recent Labs  ?Lab 09/24/21 ?1036  ?INR 1.1  ? ? ? ?Cardiac Enzymes: ?Recent Labs  ?Lab 09/24/21 ?1036 09/26/21 ?0446  ?CKTOTAL 149 91  ? ? ? ?Scheduled Meds: ? enoxaparin (LOVENOX) injection  40 mg Subcutaneous Q24H  ? feeding supplement  237 mL Oral BID BM  ? ferrous sulfate  325 mg Oral Q breakfast  ? furosemide  40 mg Intravenous Q12H  ? lidocaine (PF)  30 mL Intradermal STAT  ? polyethylene glycol  17 g Oral Daily  ? senna-docusate  1 tablet Oral BID  ? sertraline  25 mg Oral q morning  ? tobramycin  40 mg Intramuscular Once  ? ?Continuous Infusions: ? ceFTAROline (TEFLARO) IV Stopped (10/01/21 0518)  ? DAPTOmycin (CUBICIN)  IV Stopped  (09/30/21 2111)  ? ? ? LOS: 5 days  ? ?Cherene Altes, MD ?[facility] ?Office  [phone] ?Pager - Text Page per Shea Evans ? ?If 7PM-7AM, please contact night-coverage per Amion ?10/01/2021, 9:35 AM ? ? ? ? ? ?

## 2021-10-01 NOTE — Plan of Care (Signed)

## 2021-10-01 NOTE — Progress Notes (Signed)
Patient ID: Jill Cox, female   DOB: [birth year], 46 y.o.   MRN: [id number] ?Patient is undergoing surgical debridement various joint infections.  For now her neurologic status remained stable we will continue to observe for thoracic spine involvement. ?

## 2021-10-01 NOTE — Evaluation (Signed)
Occupational Therapy Evaluation Patient Details Name: Jill Cox MRN: [id number] DOB: 07/04/1976 Today's Date: 10/01/2021   History of Present Illness Jill Cox is a 46 y.o. female who is admitted to [facility] on 09/26/2021 by way of transfer from [facility] with MRSA bacteremia in the setting of epidural abscess of the thoracic spine, multiple suspected septic joints, after presenting from home to [facility] ED on 09/24/2021 complaining of polyarthralgias. Underwent LT forearm incision and drainage abscess and Left forearm fourth dorsal compartment extensor tenosynovectomy on 09/30/21.  Medical history significant for depression   Clinical Impression   Patient is currently requiring assistance with ADLs including up to moderate assist with Lower body ADLs, setup assist with Upper body ADLs,  and supervision with functional transfers to toilet with use of RW and poor tolerance to RLE dependent position.  Current level of function is significantly below patient's typical baseline.  During this evaluation, patient was limited by generalized weakness, impaired activity tolerance, and edema and pain to BLEs, especially RLE as well as RT wrist and LT forearm and thoracic spine, all of which has the potential to impact patient's safety and independence during functional mobility, as well as performance for ADLs.  Patient lives with her spouse, who is able to provide PRN supervision and assistance but also works and travels frequently. Pt unsure how much time her spouse will be available to assist her before having to return to work/travel.  Patient demonstrates good rehab potential, and should benefit from continued skilled occupational therapy services while in acute care to maximize safety, independence and quality of life at home.   ?     Recommendations for follow up therapy are one component of a multi-disciplinary discharge planning process, led by the attending physician.  Recommendations may be updated  based on patient status, additional functional criteria and insurance authorization.   Follow Up Recommendations  No OT follow up    Assistance Recommended at Discharge PRN  Patient can return home with the following A little help with bathing/dressing/bathroom;Assistance with cooking/housework    Functional Status Assessment  Patient has had a recent decline in their functional status and demonstrates the ability to make significant improvements in function in a reasonable and predictable amount of time.  Equipment Recommendations  BSC/3in1;Other (comment) ("Hip kit" AE pack)    Recommendations for Other Services       Precautions / Restrictions Precautions Precautions: Fall Restrictions Weight Bearing Restrictions: No      Mobility Bed Mobility Overal bed mobility: Modified Independent             General bed mobility comments: HOB partially elevated.    Transfers Overall transfer level: Needs assistance Equipment used: Rolling walker (2 wheels) Transfers: Sit to/from Stand Sit to Stand: Supervision                  Balance Overall balance assessment: Mild deficits observed, not formally tested                                         ADL either performed or assessed with clinical judgement   ADL Overall ADL's : Needs assistance/impaired Eating/Feeding: Set up Eating/Feeding Details (indicate cue type and reason): Due to LT hand partially immobilized. Grooming: Sitting;Set up;Bed level Grooming Details (indicate cue type and reason): Defered standing at sink due to pain. Upper Body Bathing: Set up;Sitting   Lower Body  Bathing: Minimal assistance;Sitting/lateral leans   Upper Body Dressing : Set up;Sitting   Lower Body Dressing: Moderate assistance;Sitting/lateral leans;Sit to/from stand Lower Body Dressing Details (indicate cue type and reason): Due to edema and pain.  Educated on adaptive equipment options but not practiced  yet. Toilet Transfer: Ambulation;BSC/3in1;Supervision/safety;Rolling walker (2 wheels)   Toileting- Clothing Manipulation and Hygiene: Min guard       Functional mobility during ADLs: Min guard;Supervision/safety;Rolling walker (2 wheels)       Vision Baseline Vision/History: 1 Wears glasses Ability to See in Adequate Light: 0 Adequate Patient Visual Report: No change from baseline Vision Assessment?: No apparent visual deficits     Perception Perception Perception: Within Functional Limits   Praxis Praxis Praxis: Intact    Pertinent Vitals/Pain Pain Assessment Pain Assessment: 0-10 Pain Score: 10-Worst pain ever Pain Location: 10/10 pain RT ankle, 7/10 pain to LT forearm and LLE Pain Descriptors / Indicators: Tightness Pain Intervention(s): Limited activity within patient's tolerance, Premedicated before session, Repositioned, Monitored during session     Hand Dominance Left   Extremity/Trunk Assessment Upper Extremity Assessment Upper Extremity Assessment: Overall WFL for tasks assessed;LUE deficits/detail LUE Deficits / Details: Wrist immobilized with digits immobilized at MPs LUE: Unable to fully assess due to immobilization LUE Coordination: WNL (within limits of cast)   Lower Extremity Assessment Lower Extremity Assessment: RLE deficits/detail;Defer to PT evaluation RLE Deficits / Details: Edema throughout. Unable to test pitting due to pain. RT ankle pain. RLE: Unable to fully assess due to pain   Cervical / Trunk Assessment Cervical / Trunk Assessment: Normal   Communication Communication Communication: No difficulties   Cognition Arousal/Alertness: Awake/alert Behavior During Therapy: WFL for tasks assessed/performed Overall Cognitive Status: Within Functional Limits for tasks assessed                                       General Comments  Only allowing RT forefoot to floor due to RT ankle pain and edema. Decreased toelrance to  dependent position/standing.    Exercises Other Exercises Other Exercises: Pt educated in LUE edema management exercises and demonstrated back resting LT forearm on top of head and perform fist pumps as able. Recommedned 10-20 reps of this every hour while awake. Edcuated on importance of pillow elevation to LUE and LEs.   Shoulder Instructions      Home Living Family/patient expects to be discharged to:: Private residence Living Arrangements: Spouse/significant other Available Help at Discharge: Family Type of Home: House Home Access: Stairs to enter [employer] of Steps: 6 Entrance Stairs-Rails: Can reach both;Left;Right Home Layout: Full bath on main level;Able to live on main level with bedroom/bathroom;Two level Alternate Level Stairs-Number of Steps: Loft upstiars-does not need to access   Bathroom Shower/Tub: Producer, television/film/video: Standard     Home Equipment: Shower seat - built in   Additional Comments: Wooden shower bench that folds up and down.      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        OT Problem List: Decreased strength;Pain;Decreased range of motion;Increased edema;Impaired balance (sitting and/or standing);Decreased knowledge of use of DME or AE      OT Treatment/Interventions: Self-care/ADL training;Therapeutic exercise;Therapeutic activities;DME and/or AE  instruction;Patient/family education;Balance training;Other (comment);Manual therapy;Neuromuscular education (Psychsocial supports as needed.)    OT Goals(Current goals can be found in the care plan section) Acute Rehab OT Goals Patient Stated Goal: Resume prior level of function including working and running marathons. OT Goal Formulation: With patient Time For  Goal Achievement: 10/15/21 Potential to Achieve Goals: Good ADL Goals Pt Will Perform Grooming: with modified independence;standing (With pain report of <5/10) Pt Will Perform Lower Body Dressing: with modified independence;with adaptive equipment Pt Will Transfer to Toilet: with modified independence;ambulating;bedside commode Pt Will Perform Toileting - Clothing Manipulation and hygiene: with modified independence;sit to/from stand;sitting/lateral leans Additional ADL Goal #1: Pt will identify at least 2 non-pharmalogical pain and mood management strategies to help cope with losses of function, pain and recovery time in order to support quality of life at discharge.  OT Frequency: Min 2X/week    Co-evaluation              AM-PAC OT "6 Clicks" Daily Activity     Outcome Measure Help from another person eating meals?: A Little Help from another person taking care of personal grooming?: A Little Help from another person toileting, which includes using toliet, bedpan, or urinal?: A Little Help from another person bathing (including washing, rinsing, drying)?: A Little Help from another person to put on and taking off regular upper body clothing?: A Little Help from another person to put on and taking off regular lower body clothing?: A Lot 6 Click Score: 17   End of Session Equipment Utilized During Treatment: Rolling walker (2 wheels)  Activity Tolerance: Patient limited by pain Patient left: in bed;with nursing/sitter in room  OT Visit Diagnosis: Pain Pain - Right/Left: Left Pain - part of body: Arm (RT LE)                Time: 1415-1450 OT Time Calculation (min): 35 min Charges:  OT General Charges $OT Visit: 1 Visit OT Evaluation $OT Eval Low Complexity: 1 Low OT Treatments $Therapeutic Activity: 8-22 mins  Victorino Dike, OT Acute Rehab Services Office: [phone] 10/01/2021  Theodoro Clock 10/01/2021, 3:03 PM

## 2021-10-01 NOTE — Progress Notes (Addendum)
? ?                              [facility] Progress Note ? ? ?I have seen and examined the patient, I agree with the findings above by Ainsley Spinner, PA-C, and I have directed the plan for treatment as noted. ? ?Rozanna Box, MD ?10/02/2021 ?1:08 PM ? ?Patient ID: ?Jill Cox ?MRN: [id number] ?DOB/AGE: 1976/03/28 46 y.o. ? ?Subjective: ? ?Doing fair  ?L wrist feels better ? Gram stain shows gram + cocci in pairs ?Legs feel about the same ? ?MRI R lower leg shows gastroc abscess more pronounced laterally as well as fluid between soleus and gastroc medially ? ?Afebrile ? ? ?ROS ?As above ? ?Objective:  ? ?VITALS:   ?Vitals:  ? 09/30/21 1928 10/01/21 0648 10/01/21 0709 10/01/21 0823  ?BP: (!) 112/58 105/62 126/69 124/73  ?Pulse: 66 67 62 72  ?Resp: 15 12 13 15   ?Temp: 98.2 ?F (36.8 ?C) 97.9 ?F (36.6 ?C) (!) 97.5 ?F (36.4 ?C)   ?TempSrc: Oral Axillary Oral   ?SpO2: 94% 95% 96%   ?Weight:      ?Height:      ? ? ?Estimated body mass index is 29.19 kg/m? as calculated from the following: ?  Height as of this encounter: 5' 5.98" (1.676 m). ?  Weight as of this encounter: 82 kg. ? ? ?Intake/Output   ?   03/18 0701 ?03/19 0700 03/19 0701 ?03/20 0700  ? P.O.  240  ? I.V. (mL/kg) 400 (4.9)   ? IV Piggyback  663  ? Total Intake(mL/kg) 400 (4.9) 903 (11)  ? Urine (mL/kg/hr) 500 (0.3)   ? Blood 10   ? Total Output 510   ? Net -110 +903  ?     ? Urine Occurrence 1 x 1 x  ?  ? ?LABS ? ?Results for orders placed or performed during the hospital encounter of 09/26/21 (from the past 24 hour(s))  ?Basic metabolic panel     Status: Abnormal  ? Collection Time: 09/30/21  2:49 PM  ?Result Value Ref Range  ? Sodium 137 135 - 145 mmol/L  ? Potassium 3.5 3.5 - 5.1 mmol/L  ? Chloride 100 98 - 111 mmol/L  ? CO2 27 22 - 32 mmol/L  ? Glucose, Bld 111 (H) 70 - 99 mg/dL  ? BUN 11 6 - 20 mg/dL  ? Creatinine, Ser 0.82 0.44 - 1.00 mg/dL  ? Calcium 7.6 (L) 8.9 - 10.3 mg/dL  ? GFR, Estimated >60  >60 mL/min  ? Anion gap 10 5 - 15  ?CBC with Differential/Platelet     Status: Abnormal  ? Collection Time: 09/30/21  2:49 PM  ?Result Value Ref Range  ? WBC 20.4 (H) 4.0 - 10.5 K/uL  ? RBC 3.20 (L) 3.87 - 5.11 MIL/uL  ? Hemoglobin 9.8 (L) 12.0 - 15.0 g/dL  ? HCT 29.8 (L) 36.0 - 46.0 %  ? MCV 93.1 80.0 - 100.0 fL  ? MCH 30.6 26.0 - 34.0 pg  ? MCHC 32.9 30.0 - 36.0 g/dL  ? RDW 14.1 11.5 - 15.5 %  ? Platelets 419 (H) 150 - 400 K/uL  ? nRBC 0.1 0.0 - 0.2 %  ? Neutrophils Relative % 83 %  ? Neutro Abs 16.8 (H) 1.7 - 7.7 K/uL  ? Lymphocytes Relative 4 %  ? Lymphs Abs 0.8 0.7 - 4.0 K/uL  ? Monocytes Relative 5 %  ?  Monocytes Absolute 0.9 0.1 - 1.0 K/uL  ? Eosinophils Relative 0 %  ? Eosinophils Absolute 0.0 0.0 - 0.5 K/uL  ? Basophils Relative 0 %  ? Basophils Absolute 0.1 0.0 - 0.1 K/uL  ? WBC Morphology MORPHOLOGY UNREMARKABLE   ? RBC Morphology MORPHOLOGY UNREMARKABLE   ? Smear Review MORPHOLOGY UNREMARKABLE   ? Immature Granulocytes 8 %  ? Abs Immature Granulocytes 1.67 (H) 0.00 - 0.07 K/uL  ?Aerobic/Anaerobic Culture w Gram Stain (surgical/deep wound)     Status: None (Preliminary result)  ? Collection Time: 09/30/21  5:17 PM  ? Specimen: Other Source; Body Fluid  ?Result Value Ref Range  ? Specimen Description TISSUE   ? Special Requests NONE   ? Gram Stain    ?  RARE WBC PRESENT, PREDOMINANTLY MONONUCLEAR ?RARE GRAM POSITIVE COCCI IN PAIRS ?Performed at [facility], [place] [address] [place], [place] [address] ?  ? Culture PENDING   ? Report Status PENDING   ?Aerobic/Anaerobic Culture w Gram Stain (surgical/deep wound)     Status: None (Preliminary result)  ? Collection Time: 09/30/21  5:27 PM  ? Specimen: Soft Tissue, Other  ?Result Value Ref Range  ? Specimen Description FLUID   ? Special Requests NONE   ? Gram Stain    ?  NO WBC SEEN ?RARE GRAM POSITIVE COCCI IN PAIRS ?Performed at [facility], [place] [address] [place], [place] [address] ?  ? Culture PENDING   ? Report Status PENDING   ?Comprehensive  metabolic panel     Status: Abnormal  ? Collection Time: 10/01/21  5:08 AM  ?Result Value Ref Range  ? Sodium 137 135 - 145 mmol/L  ? Potassium 3.9 3.5 - 5.1 mmol/L  ? Chloride 100 98 - 111 mmol/L  ? CO2 27 22 - 32 mmol/L  ? Glucose, Bld 194 (H) 70 - 99 mg/dL  ? BUN 12 6 - 20 mg/dL  ? Creatinine, Ser 0.62 0.44 - 1.00 mg/dL  ? Calcium 7.8 (L) 8.9 - 10.3 mg/dL  ? Total Protein 5.6 (L) 6.5 - 8.1 g/dL  ? Albumin 2.3 (L) 3.5 - 5.0 g/dL  ? AST 25 15 - 41 U/L  ? ALT 26 0 - 44 U/L  ? Alkaline Phosphatase 68 38 - 126 U/L  ? Total Bilirubin 0.8 0.3 - 1.2 mg/dL  ? GFR, Estimated >60 >60 mL/min  ? Anion gap 10 5 - 15  ?Magnesium     Status: None  ? Collection Time: 10/01/21  5:08 AM  ?Result Value Ref Range  ? Magnesium 2.3 1.7 - 2.4 mg/dL  ?Phosphorus     Status: None  ? Collection Time: 10/01/21  5:08 AM  ?Result Value Ref Range  ? Phosphorus 3.0 2.5 - 4.6 mg/dL  ?Sedimentation rate     Status: Abnormal  ? Collection Time: 10/01/21  5:08 AM  ?Result Value Ref Range  ? Sed Rate 71 (H) 0 - 22 mm/hr  ?C-reactive protein     Status: Abnormal  ? Collection Time: 10/01/21  5:08 AM  ?Result Value Ref Range  ? CRP 19.3 (H) <1.0 mg/dL  ?Culture, blood (routine x 2)     Status: None (Preliminary result)  ? Collection Time: 10/01/21  5:09 AM  ? Specimen: BLOOD  ?Result Value Ref Range  ? Specimen Description BLOOD RIGHT HAND   ? Special Requests    ?  BOTTLES DRAWN AEROBIC AND ANAEROBIC Blood Culture adequate volume  ? Culture    ?  NO GROWTH < 12 HOURS ?Performed at [facility], [place] [address], [place], [place] [address] ?  ? Report Status PENDING   ?Culture, blood (routine x 2)     Status: None (Preliminary result)  ? Collection Time: 10/01/21  5:10 AM  ? Specimen: BLOOD  ?Result Value Ref Range  ? Specimen Description BLOOD RIGHT ARM   ? Special Requests    ?  BOTTLES DRAWN AEROBIC AND ANAEROBIC Blood Culture adequate volume  ? Culture    ?  NO GROWTH < 12 HOURS ?Performed at [facility], [place] [address]  [place],  [address] ?  ? Report Status PENDING   ? ? ? ?PHYSICAL EXAM:  ? ?Gen: sitting up chair, pleasant  ?Lungs: unlabored  ?Ext:  ?     Right Lower Extremity  ?            Healed old laceration lateral R knee  ?            Extensive pitting edema throughout entire leg  ?            erythema posterior R lower leg with severe tenderness most notable to mid posterior calf, same intensity as yesteray ?Tolerated gentle passive ankle ROM   ?Tolerates gentle passive knee ROM.   ?Ext warm  ?+ DP pulse ?DPN, SPN, TN sensation intact and symmetric  ?EHL, FHL, lesser toe motor intact.  Can perform gentle ankle flexion, extension, inversion and eversion  ?  ?     Left Lower Extremity  ?            Edema noted primarily posterior thigh, pitting edema ?            mild erythema noted to L leg mostly posterior thigh ?            Moves knee and ankle without difficulty  ?            Motor and sensory functions intact  ?  ?Assessment/Plan: ?1 Day Post-Op  ? ?Principal Problem: ?  MRSA bacteremia ?Active Problems: ?  Depression ?  Septic joint ([place]) ?  Epidural abscess ?  Tenosynovitis ?  Hypoalbuminemia: Severe ?  Hypokalemia ?  Constipation ?  Anemia ?  Edema due to hypervolemia ? ? ?Anti-infectives (From admission, onward)  ? ? Start     Dose/Rate Route Frequency Ordered Stop  ? 09/30/21 1030  tobramycin (NEBCIN) injection 40 mg       ? 40 mg Intramuscular  Once 09/30/21 0945    ? 09/28/21 1030  DAPTOmycin (CUBICIN) 650 mg in sodium chloride 0.9 % IVPB       ? 8 mg/kg ? 81.7 kg ?126 mL/hr over 30 Minutes Intravenous Daily 09/28/21 0932    ? 09/28/21 1030  ceftaroline (TEFLARO) 600 mg in sodium chloride 0.9 % 100 mL IVPB       ? 600 mg ?100 mL/hr over 60 Minutes Intravenous Every 8 hours 09/28/21 0932    ? 09/27/21 0215  vancomycin (VANCOCIN) IVPB 1000 mg/200 mL premix  Status:  Discontinued       ? 1,000 mg ?200 mL/hr over 60 Minutes Intravenous 2 times daily 09/27/21 0115 09/28/21 0932  ? ?  ?. ? ?POD/HD#: 1 ? ?46 y/o MRSA  bacteremia, diffuse myofascitis/cellulitis  ?  ?- R leg myofascitis, R knee pain, R ankle pain----> MRI shows fluid collection and abscess in gastroc and along soleus  ?  OR tomorrow for I&D ? Plan for

## 2021-10-02 ENCOUNTER — Encounter (HOSPITAL_COMMUNITY): Payer: Self-pay | Admitting: Internal Medicine

## 2021-10-02 ENCOUNTER — Inpatient Hospital Stay (HOSPITAL_COMMUNITY): Payer: 59 | Admitting: Anesthesiology

## 2021-10-02 ENCOUNTER — Encounter (HOSPITAL_COMMUNITY)
Admission: AD | Disposition: A | Discharge status: Home / Self Care | Payer: Self-pay | Source: Other Acute Inpatient Hospital | Attending: Internal Medicine

## 2021-10-02 DIAGNOSIS — R7881 Bacteremia: Secondary | ICD-10-CM | POA: Diagnosis not present

## 2021-10-02 DIAGNOSIS — B9562 Methicillin resistant Staphylococcus aureus infection as the cause of diseases classified elsewhere: Secondary | ICD-10-CM | POA: Diagnosis not present

## 2021-10-02 DIAGNOSIS — L02415 Cutaneous abscess of right lower limb: Secondary | ICD-10-CM

## 2021-10-02 HISTORY — PX: I & D EXTREMITY: SHX5045

## 2021-10-02 LAB — BASIC METABOLIC PANEL
Anion gap: 10 (ref 5–15)
BUN: 16 mg/dL (ref 6–20)
CO2: 28 mmol/L (ref 22–32)
Calcium: 7.7 mg/dL — ABNORMAL LOW (ref 8.9–10.3)
Chloride: 100 mmol/L (ref 98–111)
Creatinine, Ser: 0.67 mg/dL (ref 0.44–1.00)
GFR, Estimated: >60 mL/min (ref >60)
Glucose, Bld: 117 mg/dL — ABNORMAL HIGH (ref 70–99)
Potassium: 3.2 mmol/L — ABNORMAL LOW (ref 3.5–5.1)
Sodium: 138 mmol/L (ref 135–145)

## 2021-10-02 LAB — SYNOVIAL CELL COUNT + DIFF, W/ CRYSTALS
Crystals, Fluid: NONE SEEN
Eosinophils-Synovial: 0 % (ref 0–1)
Lymphocytes-Synovial Fld: 6 % (ref 0–20)
Monocyte-Macrophage-Synovial Fluid: 4 % — ABNORMAL LOW (ref 50–90)
Neutrophil, Synovial: 90 % — ABNORMAL HIGH (ref 0–25)
WBC, Synovial: 9200 /mm3 — ABNORMAL HIGH (ref 0–200)

## 2021-10-02 LAB — CBC
HCT: 28 % — ABNORMAL LOW (ref 36.0–46.0)
Hemoglobin: 9.1 g/dL — ABNORMAL LOW (ref 12.0–15.0)
MCH: 30.7 pg (ref 26.0–34.0)
MCHC: 32.5 g/dL (ref 30.0–36.0)
MCV: 94.6 fL (ref 80.0–100.0)
Platelets: 534 10*3/uL — ABNORMAL HIGH (ref 150–400)
RBC: 2.96 MIL/uL — ABNORMAL LOW (ref 3.87–5.11)
RDW: 13.9 % (ref 11.5–15.5)
WBC: 15 10*3/uL — ABNORMAL HIGH (ref 4.0–10.5)
nRBC: 0 % (ref 0.0–0.2)

## 2021-10-02 LAB — MAGNESIUM: Magnesium: 2.4 mg/dL (ref 1.7–2.4)

## 2021-10-02 LAB — CYCLIC CITRUL PEPTIDE ANTIBODY, IGG/IGA: CCP Antibodies IgG/IgA: 8 units (ref 0–19)

## 2021-10-02 LAB — CULTURE, BLOOD (ROUTINE X 2)
Culture: NO GROWTH
Special Requests: ADEQUATE

## 2021-10-02 LAB — RHEUMATOID FACTOR: Rheumatoid fact SerPl-aCnc: 15.8 IU/mL — ABNORMAL HIGH (ref <14.0)

## 2021-10-02 LAB — PHOSPHORUS: Phosphorus: 3.8 mg/dL (ref 2.5–4.6)

## 2021-10-02 LAB — ANA W/REFLEX IF POSITIVE: Anti Nuclear Antibody (ANA): NEGATIVE

## 2021-10-02 SURGERY — IRRIGATION AND DEBRIDEMENT EXTREMITY
Anesthesia: General | Laterality: Bilateral

## 2021-10-02 MED ORDER — PHENYLEPHRINE 40 MCG/ML (10ML) SYRINGE FOR IV PUSH (FOR BLOOD PRESSURE SUPPORT)
PREFILLED_SYRINGE | INTRAVENOUS | Status: AC
  Filled 2021-10-02: qty 10

## 2021-10-02 MED ORDER — ONDANSETRON HCL 4 MG/2ML IJ SOLN
INTRAMUSCULAR | Status: DC | PRN
  Administered 2021-10-02: 4 mg via INTRAVENOUS

## 2021-10-02 MED ORDER — HYDROMORPHONE HCL 1 MG/ML IJ SOLN
INTRAMUSCULAR | Status: DC | PRN
  Administered 2021-10-02 (×2): .5 mg via INTRAVENOUS

## 2021-10-02 MED ORDER — HYDROMORPHONE HCL 1 MG/ML IJ SOLN
INTRAMUSCULAR | Status: AC
  Filled 2021-10-02: qty 0.5

## 2021-10-02 MED ORDER — 0.9 % SODIUM CHLORIDE (POUR BTL) OPTIME
TOPICAL | Status: DC | PRN
  Administered 2021-10-02: 1000 mL

## 2021-10-02 MED ORDER — CHLORHEXIDINE GLUCONATE 0.12 % MT SOLN
OROMUCOSAL | Status: AC
  Administered 2021-10-02: 15 mL via OROMUCOSAL
  Filled 2021-10-02: qty 15

## 2021-10-02 MED ORDER — DEXAMETHASONE SODIUM PHOSPHATE 10 MG/ML IJ SOLN
INTRAMUSCULAR | Status: DC | PRN
  Administered 2021-10-02: 10 mg via INTRAVENOUS

## 2021-10-02 MED ORDER — ROCURONIUM BROMIDE 10 MG/ML (PF) SYRINGE
PREFILLED_SYRINGE | INTRAVENOUS | Status: AC
  Filled 2021-10-02: qty 10

## 2021-10-02 MED ORDER — CHLORHEXIDINE GLUCONATE 0.12 % MT SOLN: 15.0000 mL | Freq: Once | OROMUCOSAL | Status: AC

## 2021-10-02 MED ORDER — FENTANYL CITRATE (PF) 250 MCG/5ML IJ SOLN
INTRAMUSCULAR | Status: DC | PRN
  Administered 2021-10-02 (×5): 50 ug via INTRAVENOUS

## 2021-10-02 MED ORDER — LIDOCAINE 2% (20 MG/ML) 5 ML SYRINGE
INTRAMUSCULAR | Status: DC | PRN
  Administered 2021-10-02: 80 mg via INTRAVENOUS

## 2021-10-02 MED ORDER — ACETAMINOPHEN 500 MG PO TABS
1000.0000 mg | ORAL_TABLET | Freq: Once | ORAL | Status: AC
  Administered 2021-10-02: 1000 mg via ORAL
  Filled 2021-10-02: qty 2

## 2021-10-02 MED ORDER — EPHEDRINE 5 MG/ML INJ
INTRAVENOUS | Status: AC
  Filled 2021-10-02: qty 5

## 2021-10-02 MED ORDER — FENTANYL CITRATE (PF) 250 MCG/5ML IJ SOLN
INTRAMUSCULAR | Status: AC
Start: 2021-10-02 — End: ?
  Filled 2021-10-02: qty 5

## 2021-10-02 MED ORDER — ORAL CARE MOUTH RINSE: 15.0000 mL | Freq: Once | OROMUCOSAL | Status: AC

## 2021-10-02 MED ORDER — PHENYLEPHRINE 40 MCG/ML (10ML) SYRINGE FOR IV PUSH (FOR BLOOD PRESSURE SUPPORT)
PREFILLED_SYRINGE | INTRAVENOUS | Status: DC | PRN
  Administered 2021-10-02: 120 ug via INTRAVENOUS
  Administered 2021-10-02: 80 ug via INTRAVENOUS
  Administered 2021-10-02: 120 ug via INTRAVENOUS

## 2021-10-02 MED ORDER — MIDAZOLAM HCL 2 MG/2ML IJ SOLN
INTRAMUSCULAR | Status: AC
  Filled 2021-10-02: qty 2

## 2021-10-02 MED ORDER — SODIUM CHLORIDE 0.9 % IR SOLN
Status: DC | PRN
  Administered 2021-10-02 (×4): 3000 mL

## 2021-10-02 MED ORDER — FENTANYL CITRATE (PF) 100 MCG/2ML IJ SOLN
INTRAMUSCULAR | Status: AC
  Filled 2021-10-02: qty 2

## 2021-10-02 MED ORDER — VANCOMYCIN HCL 1000 MG IV SOLR
INTRAVENOUS | Status: AC
  Filled 2021-10-02: qty 40

## 2021-10-02 MED ORDER — LIDOCAINE 2% (20 MG/ML) 5 ML SYRINGE
INTRAMUSCULAR | Status: AC
  Filled 2021-10-02: qty 5

## 2021-10-02 MED ORDER — ONDANSETRON HCL 4 MG/2ML IJ SOLN: 4.0000 mg | Freq: Once | INTRAMUSCULAR | Status: DC | PRN

## 2021-10-02 MED ORDER — VANCOMYCIN HCL 1000 MG IV SOLR
INTRAVENOUS | Status: DC | PRN
  Administered 2021-10-02: 1000 mg via TOPICAL

## 2021-10-02 MED ORDER — FENTANYL CITRATE (PF) 100 MCG/2ML IJ SOLN
25.0000 ug | INTRAMUSCULAR | Status: DC | PRN
  Administered 2021-10-02 (×2): 50 ug via INTRAVENOUS

## 2021-10-02 MED ORDER — LACTATED RINGERS IV SOLN: INTRAVENOUS | Status: DC

## 2021-10-02 MED ORDER — PROPOFOL 10 MG/ML IV BOLUS
INTRAVENOUS | Status: DC | PRN
  Administered 2021-10-02: 60 mg via INTRAVENOUS
  Administered 2021-10-02: 140 mg via INTRAVENOUS

## 2021-10-02 MED ORDER — PROPOFOL 10 MG/ML IV BOLUS
INTRAVENOUS | Status: AC
  Filled 2021-10-02: qty 20

## 2021-10-02 MED ORDER — MIDAZOLAM HCL 2 MG/2ML IJ SOLN
INTRAMUSCULAR | Status: DC | PRN
  Administered 2021-10-02: 2 mg via INTRAVENOUS

## 2021-10-02 MED ORDER — POTASSIUM CHLORIDE 10 MEQ/100ML IV SOLN
10.0000 meq | INTRAVENOUS | Status: AC
  Administered 2021-10-02 (×3): 10 meq via INTRAVENOUS
  Filled 2021-10-02: qty 100

## 2021-10-02 SURGICAL SUPPLY — 45 items
BAG COUNTER SPONGE SURGICOUNT (BAG) IMPLANT
BNDG COHESIVE 4X5 TAN STRL (GAUZE/BANDAGES/DRESSINGS) ×2 IMPLANT
BNDG ELASTIC 6X5.8 VLCR STR LF (GAUZE/BANDAGES/DRESSINGS) ×2 IMPLANT
BNDG GAUZE ELAST 4 BULKY (GAUZE/BANDAGES/DRESSINGS) ×3 IMPLANT
BRUSH SCRUB EZ PLAIN DRY (MISCELLANEOUS) ×4 IMPLANT
COVER SURGICAL LIGHT HANDLE (MISCELLANEOUS) ×4 IMPLANT
DRAPE U-SHAPE 47X51 STRL (DRAPES) ×2 IMPLANT
DRSG ADAPTIC 3X8 NADH LF (GAUZE/BANDAGES/DRESSINGS) ×2 IMPLANT
DRSG MEPITEL 4X7.2 (GAUZE/BANDAGES/DRESSINGS) ×2 IMPLANT
ELECT REM PT RETURN 9FT ADLT (ELECTROSURGICAL)
ELECTRODE REM PT RTRN 9FT ADLT (ELECTROSURGICAL) IMPLANT
GAUZE SPONGE 4X4 12PLY STRL (GAUZE/BANDAGES/DRESSINGS) ×2 IMPLANT
GAUZE SPONGE 4X4 12PLY STRL LF (GAUZE/BANDAGES/DRESSINGS) ×2 IMPLANT
GLOVE SRG 8 PF TXTR STRL LF DI (GLOVE) ×1 IMPLANT
GLOVE SURG ENC MOIS LTX SZ8 (GLOVE) ×2 IMPLANT
GLOVE SURG ORTHO LTX SZ7.5 (GLOVE) ×4 IMPLANT
GLOVE SURG UNDER POLY LF SZ7.5 (GLOVE) ×2 IMPLANT
GLOVE SURG UNDER POLY LF SZ8 (GLOVE) ×1
GLOVE SURG UNDER POLY LF SZ9 (GLOVE) ×2 IMPLANT
GOWN STRL REUS W/ TWL LRG LVL3 (GOWN DISPOSABLE) ×2 IMPLANT
GOWN STRL REUS W/ TWL XL LVL3 (GOWN DISPOSABLE) ×1 IMPLANT
GOWN STRL REUS W/TWL LRG LVL3 (GOWN DISPOSABLE) ×2
GOWN STRL REUS W/TWL XL LVL3 (GOWN DISPOSABLE) ×1
HANDPIECE INTERPULSE COAX TIP (DISPOSABLE)
KIT BASIN OR (CUSTOM PROCEDURE TRAY) ×2 IMPLANT
KIT TURNOVER KIT B (KITS) ×2 IMPLANT
MANIFOLD NEPTUNE II (INSTRUMENTS) ×2 IMPLANT
NS IRRIG 1000ML POUR BTL (IV SOLUTION) ×2 IMPLANT
PACK ORTHO EXTREMITY (CUSTOM PROCEDURE TRAY) ×2 IMPLANT
PAD ABD 8X10 STRL (GAUZE/BANDAGES/DRESSINGS) ×2 IMPLANT
PAD ARMBOARD 7.5X6 YLW CONV (MISCELLANEOUS) ×4 IMPLANT
PADDING CAST COTTON 6X4 STRL (CAST SUPPLIES) ×3 IMPLANT
SET HNDPC FAN SPRY TIP SCT (DISPOSABLE) IMPLANT
SOL PREP POV-IOD 4OZ 10% (MISCELLANEOUS) ×2 IMPLANT
SOL SCRUB PVP POV-IOD 4OZ 7.5% (MISCELLANEOUS) ×2
SOLUTION SCRB POV-IOD 4OZ 7.5% (MISCELLANEOUS) ×1 IMPLANT
SPONGE T-LAP 18X18 ~~LOC~~+RFID (SPONGE) ×2 IMPLANT
STOCKINETTE IMPERVIOUS 9X36 MD (GAUZE/BANDAGES/DRESSINGS) IMPLANT
SUT PDS AB 2-0 CT1 27 (SUTURE) IMPLANT
TOWEL GREEN STERILE (TOWEL DISPOSABLE) ×4 IMPLANT
TOWEL GREEN STERILE FF (TOWEL DISPOSABLE) ×2 IMPLANT
TUBE CONNECTING 12X1/4 (SUCTIONS) ×2 IMPLANT
UNDERPAD 30X36 HEAVY ABSORB (UNDERPADS AND DIAPERS) ×2 IMPLANT
WATER STERILE IRR 1000ML POUR (IV SOLUTION) ×2 IMPLANT
YANKAUER SUCT BULB TIP NO VENT (SUCTIONS) ×2 IMPLANT

## 2021-10-02 NOTE — Transfer of Care (Signed)
Immediate Anesthesia Transfer of Care Note ? ?Patient: Jill Cox ? ?Procedure(s) Performed: IRRIGATION AND DEBRIDEMENT EXTREMITY (Bilateral) ? ?Patient Location: PACU ? ?Anesthesia Type:General ? ?Level of Consciousness: drowsy and patient cooperative ? ?Airway & Oxygen Therapy: Patient Spontanous Breathing ? ?Post-op Assessment: Report given to RN and Post -op Vital signs reviewed and stable ? ?Post vital signs: Reviewed and stable ? ?Last Vitals:  ?Vitals Value Taken Time  ?BP 124/98 10/02/21 1513  ?Temp    ?Pulse 92 10/02/21 1514  ?Resp 15 10/02/21 1514  ?SpO2 79 % 10/02/21 1514  ?Vitals shown include unvalidated device data. ? ?Last Pain:  ?Vitals:  ? 10/02/21 0957  ?TempSrc:   ?PainSc: 0-No pain  ?   ? ?Patients Stated Pain Goal: 1 (09/29/21 2155) ? ?Complications: No notable events documented. ?

## 2021-10-02 NOTE — Brief Op Note (Signed)
10/02/2021 ? ?3:15 PM ? ?PATIENT:  Jill Cox  46 y.o. female ? ?PRE-OPERATIVE DIAGNOSIS:  right lower leg abscess (gastrocnemius abscess), left thigh abscess, bilateral knee pain, right ankle pain ? ?[id number] ?

## 2021-10-02 NOTE — Progress Notes (Signed)
I discussed with the patient the risks and benefits of surgery for debridement of abscesses of the left thigh and right leg with aspirations of the right ankle and both knees, which include the possibility of persistent or new infection, nerve injury, vessel injury, wound breakdown, arthritis, DVT/ PE, loss of motion, and need for further surgery among others.  We also specifically discussed the elevated risk of soft tissue breakdown that could lead to more surgery. She acknowledged these risks and wished to proceed. ? ?Altamese Clio, MD ?[facility], [place] ?[phone] ? ?

## 2021-10-02 NOTE — TOC Progression Note (Signed)
Transition of Care (TOC) - Progression Note  ? ? ?Patient Details  ?Name: Jill Cox ?MRN: [id number] ?Date of Birth: 27-Sep-1975 ? ?Transition of Care (TOC) CM/SW Contact  ?Bartholomew Crews, RN ?Phone Number: [phone] ?10/02/2021, 2:32 PM ? ?Clinical Narrative:    ? ?Notified by nursing that patient had questions about completing disability paperwork. Advised that this paperwork is completed by outpatient MD and may involve a fee. Attempted to discuss with patient at bedside, however, patient was in surgery. TOC following for transition needs.  ? ?Expected Discharge Plan: Home/Self Care ?Barriers to Discharge: Continued Medical Work up ? ?Expected Discharge Plan and Services ?Expected Discharge Plan: Home/Self Care ?  ?  ?  ?  ?                ?  ?  ?  ?  ?  ?  ?  ?  ?  ?  ? ? ?Social Determinants of Health (SDOH) Interventions ?  ? ?Readmission Risk Interventions ?No flowsheet data found. ? ?

## 2021-10-02 NOTE — Anesthesia Preprocedure Evaluation (Signed)
Anesthesia Evaluation  ?Patient identified by MRN, date of birth, ID band ?Patient awake ? ? ? ?Reviewed: ?Allergy & Precautions, NPO status , Patient's Chart, lab work & pertinent test results ? ?History of Anesthesia Complications ?Negative for: history of anesthetic complications ? ?Airway ?Mallampati: II ? ?TM Distance: >3 FB ?Neck ROM: Full ? ? ? Dental ? ?(+) Dental Advisory Given, Partial Upper, Partial Lower ?  ?Pulmonary ?Current Smoker and Patient abstained from smoking.,  ?  ?Pulmonary exam normal ?breath sounds clear to auscultation ? ? ? ? ? ? Cardiovascular ?negative cardio ROS ?Normal cardiovascular exam ?Rhythm:Regular Rate:Normal ? ? ?  ?Neuro/Psych ?PSYCHIATRIC DISORDERS Depression negative neurological ROS ?   ? GI/Hepatic ?negative GI ROS, Neg liver ROS,   ?Endo/Other  ?negative endocrine ROS ? Renal/GU ?negative Renal ROS  ? ?  ?Musculoskeletal ? ?(+) Arthritis , right lower leg abscess (gastrocnemius abscess)  ? Abdominal ?  ?Peds ? Hematology ? ?(+) Blood dyscrasia, anemia ,   ?Anesthesia Other Findings ?Day of surgery medications reviewed with the patient. ? Reproductive/Obstetrics ? ?  ? ? ? ? ? ? ? ? ? ? ? ? ? ?  ?  ? ? ? ? ? ? ? ? ?Anesthesia Physical ? ?Anesthesia Plan ? ?ASA: 2 ? ?Anesthesia Plan: General  ? ?Post-op Pain Management: Tylenol PO (pre-op)*  ? ?Induction: Intravenous ? ?PONV Risk Score and Plan: 3 and Midazolam, Dexamethasone and Ondansetron ? ?Airway Management Planned: LMA ? ?Additional Equipment:  ? ?Intra-op Plan:  ? ?Post-operative Plan: Extubation in OR ? ?Informed Consent: I have reviewed the patients History and Physical, chart, labs and discussed the procedure including the risks, benefits and alternatives for the proposed anesthesia with the patient or authorized representative who has indicated his/her understanding and acceptance.  ? ? ? ?Dental advisory given ? ?Plan Discussed with: CRNA ? ?Anesthesia Plan Comments:    ? ? ? ? ? ? ?Anesthesia Quick Evaluation ? ?

## 2021-10-02 NOTE — Progress Notes (Signed)
Pt would like to speak to social Investment banker, operational today r/t ensuring that this visit is processed under her [employer] first, then her insurance. Also expresses urgency related to [facility] paperwork as her paycheck is processed today. ?

## 2021-10-02 NOTE — Progress Notes (Signed)
? Jill Cox  [id number] DOB: [birth year] DOA: 09/26/2021 ?PCP: Bubba Camp, [place]   ? ?Brief Narrative:  ?45 year old with a history of depression who was admitted to [facility] 09/26/2021 on transfer from [facility] with known MRSA bacteremia in the setting of a thoracic epidural abscess with concern for possible multiple septic joints.  She was originally admitted to [facility] 09/24/2021 complaining of polyarthralgia.  She had been seen for polyarthralgia in the [facility] ED 09/23/2021 at which time she was given Keflex for "cellulitis" and sent home.  Blood cultures ultimately grew MRSA and the patient was contacted to return to the ED.  Upon her return she was admitted and placed on IV vancomycin.  Repeat blood cultures were again positive for MRSA.  MRI of bilateral knees demonstrated moderate to large effusions worrisome for septic arthritis.  Right ankle MRI demonstrated effusion possibly septic in nature.  MRIs of bilateral wrists suggested tenosynovitis without overt septic arthritis.  Subsequent MRI of the thoracic spine noted an epidural abscess at T2/T3.  TEE was accomplished with no evidence of bacterial endocarditis. ? ?Significant Events: ?3/18 left forearm I&D with fourth dorsal compartment extensor tenosynovectomy ? ?Consultants:  ?Neurosurgery ?Orthopedics ?ID ? ?Code Status: FULL CODE ? ?DVT prophylaxis: ?Lovenox ? ?Interim Hx: ?No acute events reported overnight.  Potassium modestly low at 3.2.  WBC trending downward.  MRI of the left thigh 3/19 noted a new 5.8 x 0.7 x 16.2 left thigh abscess. To OR today for B LE I&D. I completed FMLA paperwork for the patient today and handed it to her husband in front of her in her room.  ? ?Assessment & Plan: ? ?MRSA bacteremia -cervical and T2/T3 epidural abscess ?Initial blood cultures + 09/23/2021 - no evidence of endocarditis on TEE - repeat blood cultures 09/27/2021 positive - ID directing antibiotic therapy -changed from IV vancomycin to IV daptomycin and teflaro  due to persisting fevers - Neurosurgery consulted - further imaging raises concern for possible phlegmon at the L5 vertebral body and L4/L5 discitis - no surgical intervention currently suggested by Neurosurgery ? ?Bilateral wrist tenosynovitis ?Initially noted on MRI at [facility] but not felt to represent frank septic arthritis initially - Hand Surgery consulted 3/18 as CT 3/17 suggested abcess of L wrist/forearm - I&D of L wrist/forearm in OR 3/18 w/o frank pus encountered - cultures noting rare Staph aureus ? ?Possible bilateral septic knee arthritis - possible R gastroc and quad abscesses - L thigh abscess ?Initial concern raised by MRI accomplished at [place] following - MRI R LE 3/19 suggests possible abscesses and probable septic arthritis of R knee - MRI L LE 3/19 confirmed new abscess of L thigh - to OR 3/20 for I&D and exploration  ? ?Depression ?Continue Zoloft - discuss increasing dose as pt facing situational acute worsening of her sx  ? ?Anasarca - hypoalbuminemia ?Normal EF on TEE - venous duplex to rule out DVT negative B LE  ? ?Anemia ?No evidence of blood loss - likely due to bacteremia/smoldering infection -monitoring trend ? ?Hypokalemia ?Recurring -supplement and follow -magnesium normal ? ?Family Communication: Spoke with husband at bedside ?Disposition: From home -hopeful for eventual return home but not yet medically stable ? ?Objective: ?Blood pressure 124/61, pulse 70, temperature 98 ?F (36.7 ?C), temperature source Oral, resp. rate 16, height 5' 5.98" (1.676 m), weight 82 kg, SpO2 100 %. ? ?Intake/Output Summary (Last 24 hours) at 10/02/2021 1010 ?Last data filed at 10/01/2021 1558 ?Gross per 24 hour  ?Intake 100 ml  ?  Output --  ?Net 100 ml  ? ? ?Filed Weights  ? 09/26/21 2336 09/30/21 0500 09/30/21 1548  ?Weight: 81.7 kg 82 kg 82 kg  ? ? ?Examination: ?General: No acute respiratory distress ?Lungs: Clear to auscultation bilaterally -no wheezing ?Cardiovascular: Regular rate and rhythm -no  murmur or rub ?Abdomen: Nontender, nondistended, soft, bowel sounds positive ?Extremities: Left wrist and forearm dressed -right wrist with decreased erythema compared to exam yesterday -bilateral lower extremity swollen with tenderness of right calf with associated overlying erythema and erythema of posterior right thigh as well as medial and posterior left thigh without significant change ? ?CBC: ?Recent Labs  ?Lab 09/28/21 ?0018 09/29/21 ?[id number] 09/30/21 ?1449 10/02/21 ?0208  ?WBC 14.9* 14.5* 20.4* 15.0*  ?NEUTROABS 11.4* 11.9* 16.8*  --   ?HGB 9.0* 8.2* 9.8* 9.1*  ?HCT 27.4* 24.5* 29.8* 28.0*  ?MCV 93.5 92.5 93.1 94.6  ?PLT 199 218 419* 534*  ? ? ?Basic Metabolic Panel: ?Recent Labs  ?Lab 09/29/21 ?0626 09/30/21 ?1449 10/01/21 ?[id number] 10/02/21 ?0208  ?NA 136 137 137 138  ?K 3.3* 3.5 3.9 3.2*  ?CL 101 100 100 100  ?CO2 26 27 27 28   ?GLUCOSE 100* 111* 194* 117*  ?BUN 9 11 12 16   ?CREATININE 0.75 0.82 0.62 0.67  ?CALCIUM 7.4* 7.6* 7.8* 7.7*  ?MG 2.0  --  2.3 2.4  ?PHOS  --   --  3.0 3.8  ? ? ?GFR: ?Estimated Creatinine Clearance: 93.9 mL/min (by C-G formula based on SCr of 0.67 mg/dL). ? ?Liver Function Tests: ?Recent Labs  ?Lab 09/27/21 ?[id number] 09/29/21 ?[id number] 10/01/21 ?[id number]  ?AST 28 34 25  ?ALT 32 28 26  ?ALKPHOS 84 63 68  ?BILITOT 1.0 1.6* 0.8  ?PROT 4.6* 5.3* 5.6*  ?ALBUMIN <1.5* 3.0* 2.3*  ? ? ? ? ?Cardiac Enzymes: ?Recent Labs  ?Lab 09/26/21 ?0446  ?CKTOTAL 91  ? ? ? ?Scheduled Meds: ? enoxaparin (LOVENOX) injection  40 mg Subcutaneous Q24H  ? feeding supplement  237 mL Oral BID BM  ? ferrous sulfate  325 mg Oral Q breakfast  ? furosemide  40 mg Intravenous Q12H  ? polyethylene glycol  17 g Oral Daily  ? senna-docusate  1 tablet Oral BID  ? sertraline  25 mg Oral q morning  ? tobramycin  40 mg Intramuscular Once  ? ?Continuous Infusions: ? ceFTAROline (TEFLARO) IV 600 mg (10/02/21 0404)  ? DAPTOmycin (CUBICIN)  IV 650 mg (10/01/21 1943)  ? ? ? LOS: 6 days  ? ?Cherene Altes, MD ?[facility] ?Office   [phone] ?Pager - Text Page per Shea Evans ? ?If 7PM-7AM, please contact night-coverage per Amion ?10/02/2021, 10:10 AM ? ? ? ? ? ?

## 2021-10-02 NOTE — Anesthesia Procedure Notes (Signed)
Procedure Name: LMA Insertion ?Date/Time: 10/02/2021 1:37 PM ?Performed by: Rosiland Oz, CRNA ?Pre-anesthesia Checklist: Patient identified, Emergency Drugs available, Suction available, Patient being monitored and Timeout performed ?Patient Re-evaluated:Patient Re-evaluated prior to induction ?Oxygen Delivery Method: Circle system utilized ?Preoxygenation: Pre-oxygenation with 100% oxygen ?Induction Type: IV induction ?LMA: LMA inserted ?LMA Size: 4.0 ?Placement Confirmation: breath sounds checked- equal and bilateral and positive ETCO2 ?Tube secured with: Tape ?Dental Injury: Teeth and Oropharynx as per pre-operative assessment  ? ? ? ? ?

## 2021-10-02 NOTE — Op Note (Signed)
NAME: Jill Cox, Jill Cox ?MEDICAL RECORD NO: [id number] ?ACCOUNT NO: [id number] ?DATE OF BIRTH: May 23, 1976 ?FACILITY: [facility] ?LOCATION: [facility] ?PHYSICIAN: Astrid Divine. Marcelino Scot, MD ? ?Operative Report  ? ?DATE OF PROCEDURE: 10/02/2021 ? ?PREOPERATIVE DIAGNOSES:   ?1.  Multiple infections lower extremities following methicillin-resistant Staphylococcus aureus bacteremia. ?2.  Bilateral knee pain. ?3. Right ankle pain. ?4.  Left thigh abscess. ?5.  Right calf abscess. ? ?POSTOPERATIVE DIAGNOSES:   ?1.  Effusion right knee without evidence of purulence. ?2.  Left thigh abscess, superficial and deep to the tensor fascia. ?3.  Deep right calf abscess and myositis. ? ?PROCEDURES:   ?1.  Incision and drainage of deep abscess, left thigh. ?2.  Incision and drainage of deep abscess, right calf. ?3.  Aspiration of right knee joint. ?4.  Aspiration of left knee joint. ?5.  Aspiration of right ankle joint. ? ?SURGEON:  Astrid Divine. Marcelino Scot, MD ? ?ASSISTANT:  Ainsley Spinner, PA-C ? ?SECOND ASSIST: PA student. ? ?ANESTHESIA:  General. ? ?COMPLICATIONS:  None. ? ?DRAINS:  Penrose x2 right calf, one proximal and one distal limb. ? ?SPECIMENS:  Multiple including left knee swab, right ankle swab, right knee synovial fluid, left thigh deep abscess, right calf abscess. ? ?PATIENT DISPOSITION:  To PACU. ? ?CONDITION:  Stable. ? ?BRIEF SUMMARY AND INDICATION FOR PROCEDURE: The patient is a pleasant 46 year old who was in reasonable and improving state of health until she began to experience polyarthralgia and was initially seen in an outside facility where she received steroids.  ? Subsequent cellulitis and bacteremia as well as infection abscess formation in the left wrist developed as well as cellulitis in the legs bilaterally.  Ongoing workup including TEE did not demonstrate evidence of valvular infection or other source.  MRI ? scan suggested abscess in the left thigh and right calf.  A CT scan was less compelling particularly after directly observing Dr.  Angus Palms debridement of the left wrist intraoperatively.  However, because of the persistence of symptoms and now the MRI  ?scans, we have recommended proceeding with incision and drainage.  I discussed with her the risks and benefits of surgery including the possibility of persistent infection, recurrent infection, arthritis, need for further surgery and multiple others and  ?she did wish to proceed. ? ?BRIEF SUMMARY OF PROCEDURE:  The patient was taken to the operating room where general anesthesia was induced.  She remains on IV treatment for her multifocal infection including MRSA coverage.  We began with aspiration of the right ankle, obtaining only ? a mL, which was suitable for swab culture analysis, but not cell or synovial count.  Similarly, the left knee despite being completely within the joint in between the articular surfaces from the medial approach was unable to withdrawal more than a mL of ? fluid.  No discernible effusion was present on that side. ? ?I then turned attention to the right knee where there was a palpable effusion.  We withdrew 11 mL of serosanguineous fluid, which was not turbid or suspicious for infection.  This was sent for synovial count and fluid analysis, in addition to culture. ? ?We then continued with incision and drainage of the left thigh abscess using a 6 cm incision as that was ultimately expanded to that length after beginning around 3 cm.  We did encounter some purulence over top of the tensor and extending up anteriorly  ?onto the anterior aspect of the thigh.  I then incised deep where there was a deep collection on MRI.  This was  much more amorphous and less clearly demarcated.  There was no purulence there.  Less clearly discernible fluid collection, but was washed out ? thoroughly beneath and deep to the fascia, both proximally and distally.  Following this, chlorhexidine wash followed by 3 liters of saline were taken to the area.  PDS and loose nylon closure were  performed. ? ?The right calf was approached through a 5 cm incision centered in the area of maximum tenderness at the musculotendinous junction.  I continued dissection down to the fascia, did not encounter any fluid superficial to that of any significance. After  ?incising it, however, we immediately got some purulent muscle tissue and there was softening and infection coursing around the gastroc as well as between the gastroc and the soleus.  Scissors were used to debride some of this muscle and chlorhexidine  ?wash followed by 6 liters of saline taken through their, then two 1/2 inch Penrose drains placed, one proximal limb and one distal limb and a loose closure with 2-0 PDS and 2-0 nylon.  Sterile gently compressive dressing was applied on both sides from  ?foot to thigh.  The patient was awakened from anesthesia and transported to PACU in stable condition.  Ainsley Spinner, PA-C, was present and assisted me throughout.  It should be noted that prior to closure, we did place some vancomycin powder in both wounds ? as well. ? ?PROGNOSIS:  The patient should improve following these incisional debridements with continued antibiotic treatment.  She can mobilize, weightbearing as tolerated bilaterally with no motion restrictions.  Continue to follow cultures and adjust antibiotic  ?treatment accordingly. ? ? ?VAI ?D: 10/02/2021 3:16:12 pm T: 10/02/2021 10:47:00 pm  ?JOB: [id number]  ?

## 2021-10-02 NOTE — Progress Notes (Signed)
?   ? ? ? ? ?[facility] ? ?Date of Admission:  09/26/2021    ? ? ?Lines: ?Peripheral iv's ? ?Abx: ?3/16-c daptomycin/ceftaroline ? ?3/12-3/15 vancomycin ? ? ?ASSESSMENT: ?46 yo female admitted in transfer from [facility] on 3/14 for severe metastatic mrsa sepsis ? ?No obvious risk factor for mrsa bsi ? ?She developed sx of polyarthralgia/bodyache and malaise 1-2 weeks prior to admission ? ?Initial evaluation at [facility] positive for bcx 3/11 (persistent daily until 3/13), all mri (thoracic/cervical spine), knees, wrists, femurs, right ankle are positive for soft tissue pyogenic focus and the spine mri series suggest posterior epidural abscess from cervical to upper thoracic area. L-spine mri shows potential C2 involvement as well  ? ?3/20 assessment ?3/16 and 3/19 bcx ngtd, since dapto/ceftaroline started ?3/18 s/p left wrist I&D --> patient said pain much better there; cx staph aureus ?Repeat imaging bilateral LE done worsening disease -- ortho planned at least I&D RLE today ? ?Tolerating abx well ? ?PLAN: ?Continue dapto/ceftaroline; plan 2 weeks then switch back to either vancomycin or daptomycin alone ?F/u repeat blood cx ?Pending I&D rle today ?If a picc is to be placed, I suspect best to do it after all surgery is done, as often this induce bacteremia that might seed the central line  ?Discussed with primary team ? ? ? ? ? ?Principal Problem: ?  MRSA bacteremia ?Active Problems: ?  Depression ?  Septic joint (HCC) ?  Epidural abscess ?  Tenosynovitis ?  Hypoalbuminemia: Severe ?  Hypokalemia ?  Constipation ?  Anemia ?  Edema due to hypervolemia ? ? ?No Known Allergies ? ?Scheduled Meds: ? enoxaparin (LOVENOX) injection  40 mg Subcutaneous Q24H  ? feeding supplement  237 mL Oral BID BM  ? ferrous sulfate  325 mg Oral Q breakfast  ? furosemide  40 mg Intravenous Q12H  ? polyethylene glycol  17 g Oral Daily  ? senna-docusate  1 tablet Oral BID  ? sertraline  25 mg Oral q morning  ? tobramycin   40 mg Intramuscular Once  ? ?Continuous Infusions: ? ceFTAROline (TEFLARO) IV 600 mg (10/02/21 0404)  ? DAPTOmycin (CUBICIN)  IV 650 mg (10/01/21 1943)  ? potassium chloride    ? ?PRN Meds:.acetaminophen **OR** [DISCONTINUED] acetaminophen, cyclobenzaprine, HYDROcodone-acetaminophen, HYDROmorphone (DILAUDID) injection, naLOXone (NARCAN)  injection, ondansetron (ZOFRAN) IV ? ? ?SUBJECTIVE: ?Gettign to bathroom ok ?Stable swelling LE  ?Perhaps worsening redness in right calf ?Stable redness left wrist ?Ongoing fever ?Stable wbc ? ?No n/v/diarrhea ?No LE deficit ?No urinary incontinence ? ?Review of Systems: ?ROS ?All other ROS was negative, except mentioned above ? ? ? ? ?OBJECTIVE: ?Vitals:  ? 10/01/21 1739 10/01/21 2116 10/02/21 0600 10/02/21 0754  ?BP: 109/61 (!) 108/54 121/69 124/61  ?Pulse: 65 66 68 70  ?Resp: 16 16 16 16   ?Temp: 98.1 ?F (36.7 ?C) 97.9 ?F (36.6 ?C) 97.8 ?F (36.6 ?C) 98 ?F (36.7 ?C)  ?TempSrc: Oral Oral Oral Oral  ?SpO2: 97% 96% 97% 100%  ?Weight:      ?Height:      ? ?Body mass index is 29.19 kg/m?. ? ?Physical Exam ?General/constitutional: no distress, pleasant ?HEENT: Normocephalic, PER, Conj Clear, EOMI, Oropharynx clear ?Neck supple ?CV: rrr no mrg ?Lungs: clear to auscultation, normal respiratory effort ?Abd: Soft, Nontender ?Ext: no edema ?Skin/msk: LUE dressing c/d/I with good rom in wrist/fingers; bilateral LE edematous and tender in calf/thigh, but intact rom in knees/ankles; there is erythema in RLE calf and left thigh ?Neuro: nonfocal ? ? ? ? ? ?  Lab Results ?Lab Results  ?Component Value Date  ? WBC 15.0 (H) 10/02/2021  ? HGB 9.1 (L) 10/02/2021  ? HCT 28.0 (L) 10/02/2021  ? MCV 94.6 10/02/2021  ? PLT 534 (H) 10/02/2021  ?  ?Lab Results  ?Component Value Date  ? CREATININE 0.67 10/02/2021  ? BUN 16 10/02/2021  ? NA 138 10/02/2021  ? K 3.2 (L) 10/02/2021  ? CL 100 10/02/2021  ? CO2 28 10/02/2021  ?  ?Lab Results  ?Component Value Date  ? ALT 26 10/01/2021  ? AST 25 10/01/2021  ? ALKPHOS  68 10/01/2021  ? BILITOT 0.8 10/01/2021  ?  ? ? ?Microbiology: ?Recent Results (from the past 240 hour(s))  ?Blood culture (single)     Status: Abnormal  ? Collection Time: 09/23/21  1:53 PM  ? Specimen: BLOOD  ?Result Value Ref Range Status  ? Specimen Description   Final  ?  BLOOD RIGHT ANTECUBITAL ?Performed at [facility], [address] [place], [place] [address] ?  ? Special Requests   Final  ?  BOTTLES DRAWN AEROBIC AND ANAEROBIC Blood Culture adequate volume ?Performed at [facility] [address] [place], [place] [address] ?  ? Culture  Setup Time   Final  ?  IN BOTH AEROBIC AND ANAEROBIC BOTTLES ?GRAM POSITIVE COCCI ?CRITICAL RESULT CALLED TO, READ BACK BY AND VERIFIED WITH: ?Clovia Cuff [facility] AT 0347 09/24/2021 GAA ?Performed at [facility], [address] [place], [place] [address] ?  ? Culture METHICILLIN RESISTANT STAPHYLOCOCCUS AUREUS (A)  Final  ? Report Status 09/26/2021 FINAL  Final  ? Organism ID, Bacteria METHICILLIN RESISTANT STAPHYLOCOCCUS AUREUS  Final  ?    Susceptibility  ? Methicillin resistant staphylococcus aureus - MIC*  ?  CIPROFLOXACIN <=0.5 SENSITIVE Sensitive   ?  ERYTHROMYCIN <=0.25 SENSITIVE Sensitive   ?  GENTAMICIN <=0.5 SENSITIVE Sensitive   ?  OXACILLIN >=4 RESISTANT Resistant   ?  TETRACYCLINE <=1 SENSITIVE Sensitive   ?  VANCOMYCIN 1 SENSITIVE Sensitive   ?  TRIMETH/SULFA <=10 SENSITIVE Sensitive   ?  CLINDAMYCIN <=0.25 SENSITIVE Sensitive   ?  RIFAMPIN <=0.5 SENSITIVE Sensitive   ?  Inducible Clindamycin NEGATIVE Sensitive   ?  * METHICILLIN RESISTANT STAPHYLOCOCCUS AUREUS  ?Blood Culture ID Panel (Reflexed)     Status: Abnormal  ? Collection Time: 09/23/21  1:53 PM  ?Result Value Ref Range Status  ? Enterococcus faecalis NOT DETECTED NOT DETECTED Final  ? Enterococcus Faecium NOT DETECTED NOT DETECTED Final  ? Listeria monocytogenes NOT DETECTED NOT DETECTED Final  ? Staphylococcus species DETECTED (A) NOT DETECTED Final  ?  Comment:  CRITICAL RESULT CALLED TO, READ BACK BY AND VERIFIED WITH: ?Clovia Cuff [facility] AT 0522 09/24/2021 GAA ?  ? Staphylococcus aureus (BCID) DETECTED (A) NOT DETECTED Final  ?  Comment: Methicillin (oxacillin)-resistant Staphylococcus aureus (MRSA). MRSA is predictably resistant to beta-lactam antibiotics (except ceftaroline). Preferred therapy is vancomycin unless clinically contraindicated. Patient requires contact precautions if  ?hospitalized. ?CRITICAL RESULT CALLED TO, READ BACK BY AND VERIFIED WITH: ?Clovia Cuff PHARMD AT 6387 09/24/2021 GAA ?  ? Staphylococcus epidermidis NOT DETECTED NOT DETECTED Final  ? Staphylococcus lugdunensis NOT DETECTED NOT DETECTED Final  ? Streptococcus species NOT DETECTED NOT DETECTED Final  ? Streptococcus agalactiae NOT DETECTED NOT DETECTED Final  ? Streptococcus pneumoniae NOT DETECTED NOT DETECTED Final  ? Streptococcus pyogenes NOT DETECTED NOT DETECTED Final  ? A.calcoaceticus-baumannii NOT DETECTED NOT DETECTED Final  ? Bacteroides fragilis  NOT DETECTED NOT DETECTED Final  ? Enterobacterales NOT DETECTED NOT DETECTED Final  ? Enterobacter cloacae complex NOT DETECTED NOT DETECTED Final  ? Escherichia coli NOT DETECTED NOT DETECTED Final  ? Klebsiella aerogenes NOT DETECTED NOT DETECTED Final  ? Klebsiella oxytoca NOT DETECTED NOT DETECTED Final  ? Klebsiella pneumoniae NOT DETECTED NOT DETECTED Final  ? Proteus species NOT DETECTED NOT DETECTED Final  ? Salmonella species NOT DETECTED NOT DETECTED Final  ? Serratia marcescens NOT DETECTED NOT DETECTED Final  ? Haemophilus influenzae NOT DETECTED NOT DETECTED Final  ? Neisseria meningitidis NOT DETECTED NOT DETECTED Final  ? Pseudomonas aeruginosa NOT DETECTED NOT DETECTED Final  ? Stenotrophomonas maltophilia NOT DETECTED NOT DETECTED Final  ? Candida albicans NOT DETECTED NOT DETECTED Final  ? Candida auris NOT DETECTED NOT DETECTED Final  ? Candida glabrata NOT DETECTED NOT DETECTED Final  ? Candida krusei NOT DETECTED NOT  DETECTED Final  ? Candida parapsilosis NOT DETECTED NOT DETECTED Final  ? Candida tropicalis NOT DETECTED NOT DETECTED Final  ? Cryptococcus neoformans/gattii NOT DETECTED NOT DETECTED Final  ? Meth resistant

## 2021-10-03 ENCOUNTER — Encounter (HOSPITAL_COMMUNITY): Payer: Self-pay | Admitting: Orthopedic Surgery

## 2021-10-03 DIAGNOSIS — B9562 Methicillin resistant Staphylococcus aureus infection as the cause of diseases classified elsewhere: Secondary | ICD-10-CM | POA: Diagnosis not present

## 2021-10-03 DIAGNOSIS — R7881 Bacteremia: Secondary | ICD-10-CM | POA: Diagnosis not present

## 2021-10-03 LAB — BASIC METABOLIC PANEL
Anion gap: 9 (ref 5–15)
BUN: 16 mg/dL (ref 6–20)
CO2: 28 mmol/L (ref 22–32)
Calcium: 8 mg/dL — ABNORMAL LOW (ref 8.9–10.3)
Chloride: 101 mmol/L (ref 98–111)
Creatinine, Ser: 0.72 mg/dL (ref 0.44–1.00)
GFR, Estimated: >60 mL/min (ref >60)
Glucose, Bld: 112 mg/dL — ABNORMAL HIGH (ref 70–99)
Potassium: 3.7 mmol/L (ref 3.5–5.1)
Sodium: 138 mmol/L (ref 135–145)

## 2021-10-03 LAB — CULTURE, BLOOD (ROUTINE X 2)
Culture: NO GROWTH
Culture: NO GROWTH
Special Requests: ADEQUATE
Special Requests: ADEQUATE

## 2021-10-03 LAB — CBC
HCT: 27.8 % — ABNORMAL LOW (ref 36.0–46.0)
Hemoglobin: 9.1 g/dL — ABNORMAL LOW (ref 12.0–15.0)
MCH: 31 pg (ref 26.0–34.0)
MCHC: 32.7 g/dL (ref 30.0–36.0)
MCV: 94.6 fL (ref 80.0–100.0)
Platelets: 688 10*3/uL — ABNORMAL HIGH (ref 150–400)
RBC: 2.94 MIL/uL — ABNORMAL LOW (ref 3.87–5.11)
RDW: 14.1 % (ref 11.5–15.5)
WBC: 13.3 10*3/uL — ABNORMAL HIGH (ref 4.0–10.5)
nRBC: 0 % (ref 0.0–0.2)

## 2021-10-03 LAB — MAGNESIUM: Magnesium: 2.2 mg/dL (ref 1.7–2.4)

## 2021-10-03 LAB — CK: Total CK: 64 U/L (ref 38–234)

## 2021-10-03 NOTE — Anesthesia Postprocedure Evaluation (Signed)
Anesthesia Post Note ? ?Patient: Jill Cox ? ?Procedure(s) Performed: IRRIGATION AND DEBRIDEMENT EXTREMITY (Bilateral) ? ?  ? ?Patient location during evaluation: PACU ?Anesthesia Type: General ?Level of consciousness: awake and alert, oriented and patient cooperative ?Pain management: pain level controlled ?Vital Signs Assessment: post-procedure vital signs reviewed and stable ?Respiratory status: spontaneous breathing, nonlabored ventilation and respiratory function stable ?Cardiovascular status: blood pressure returned to baseline and stable ?Postop Assessment: no apparent nausea or vomiting ?Anesthetic complications: no ? ? ?No notable events documented. ? ?Last Vitals:  ?Vitals:  ? 10/02/21 2025 10/03/21 0516  ?BP: 107/64 (!) 114/56  ?Pulse: 74 76  ?Resp: 15 16  ?Temp: 36.8 ?C 36.6 ?C  ?SpO2: 96% 96%  ?  ?Last Pain:  ?Vitals:  ? 10/03/21 0554  ?TempSrc:   ?PainSc: 6   ? ? ?  ?  ?  ?  ?  ?  ? ?Jill Cox ? ? ? ? ?

## 2021-10-03 NOTE — Evaluation (Signed)
Physical Therapy Evaluation ?Patient Details ?Name: Jill SchleinJean Candela ?MRN: [id number] ?DOB: Nov 15, 1975 ?Today's Date: 10/03/2021 ? ?History of Present Illness ? Pt is a 46 y.o. female admitted to [facility] 09/24/21 with c/o polyarthralgias, [facility]/21 with MRSA bacteremia in the setting of epidural abscess of the thoracic spine, multiple suspected septic joints. S/p L forearm I&D and fourth dorsal compartment extensor tenosynovectomy on 3/18. Pt with BLE pain and multiple BLE abscesses; s/p bilateral knee and R ankle aspirations, L thigh I&D, R calf I&D on 3/20. PMH includes depression, vaping. ?  ?Clinical Impression ? Pt presents with an overall decrease in functional mobility secondary to above. PTA, pt independent, works, lives with supportive husband, is a marathon runner. Initiated educ re: precautions, positioning, therex, and importance of mobility. Today, pt able to mobilize throughout room without DME, supervision for safety; pt currently adamant against DME use. Pt very focused on her mental health during recovery, bringing this up multiple times despite conversation focused on different topics; reached out to CM/SW regarding counseling services per pt request. Pt would benefit from continued acute PT services to maximize functional mobility and independence prior to d/c with OP ortho PT for return to sport.     ? ?Recommendations for follow up therapy are one component of a multi-disciplinary discharge planning process, led by the attending physician.  Recommendations may be updated based on patient status, additional functional criteria and insurance authorization. ? ?Follow Up Recommendations Outpatient PT ? ?  ?Assistance Recommended at Discharge PRN  ?Patient can return home with the following ? A little help with bathing/dressing/bathroom;Assistance with cooking/housework;Assist for transportation;Help with stairs or ramp for entrance ? ?  ?Equipment Recommendations  (TBD)  ?Recommendations for Other  Services ?    ?  ?Functional Status Assessment Patient has had a recent decline in their functional status and demonstrates the ability to make significant improvements in function in a reasonable and predictable amount of time.  ? ?  ?Precautions / Restrictions Precautions ?Precautions: Fall ?Restrictions ?Weight Bearing Restrictions: Yes ?RLE Weight Bearing: Weight bearing as tolerated ?LLE Weight Bearing: Weight bearing as tolerated  ? ?  ? ?Mobility ? Bed Mobility ?Overal bed mobility: Modified Independent ?  ?  ?  ?  ?  ?  ?General bed mobility comments: HOB partially elevated. ?  ? ?Transfers ?Overall transfer level: Modified independent ?Equipment used: None ?Transfers: Sit to/from Stand ?  ?  ?  ?  ?  ?  ?General transfer comment: increased time and effort; declines DME use; standing from EOB and low toilet height mod indep ?  ? ?Ambulation/Gait ?Ambulation/Gait assistance: Supervision ?Gait Distance (Feet): 20 Feet ?Assistive device: None ?Gait Pattern/deviations: Step-through pattern, Decreased stride length, Decreased dorsiflexion - right, Decreased weight shift to right, Trunk flexed, Antalgic, Knee flexed in stance - right ?Gait velocity: Decreased ?  ?  ?General Gait Details: Slow, antalgic gait with intermittent UE support on furniture/door/walls; pt declined DME use despite encouragement; supervision for balance ? ?Stairs ?  ?  ?  ?  ?  ? ?Wheelchair Mobility ?  ? ?Modified Rankin (Stroke Patients Only) ?  ? ?  ? ?Balance Overall balance assessment: Needs assistance ?  ?Sitting balance-Leahy Scale: Good ?  ?  ?Standing balance support: No upper extremity supported, During functional activity ?Standing balance-Leahy Scale: Fair ?  ?  ?  ?  ?  ?  ?  ?  ?  ?  ?  ?  ?   ? ? ? ?Pertinent  Vitals/Pain Pain Assessment ?Pain Assessment: Faces ?Faces Pain Scale: Hurts even more ?Pain Location: BLEs (especially R ankle) > L forearm ?Pain Descriptors / Indicators: Tightness, Guarding ?Pain Intervention(s):  Monitored during session, Limited activity within patient's tolerance  ? ? ?Home Living Family/patient expects to be discharged to:: Private residence ?Living Arrangements: Spouse/significant other ?Available Help at Discharge: Family;Available 24 hours/day ?Type of Home: House ?Home Access: Stairs to enter ?Entrance Stairs-Rails: Right;Left;Can reach both ?Entrance Stairs-Number of Steps: 6 ?Alternate Level Stairs-Number of Steps: Loft upstairs-does not need to access ?Home Layout: Full bath on main level;Able to live on main level with bedroom/bathroom;Two level ?Home Equipment: Shower seat - built in ?Additional Comments: Wooden shower bench that folds up and down.  ?  ?Prior Function Prior Level of Function : Independent/Modified Independent;Driving;Working/employed ?  ?  ?  ?  ?  ?  ?Mobility Comments: Independent, working as Furniture conservator/restorer at [employer], marathon runner (had signed up for races in 03/2022 & 04/2022) ?ADLs Comments: Indep ?  ? ? ?Hand Dominance  ? Dominant Hand: Left ? ?  ?Extremity/Trunk Assessment  ? Upper Extremity Assessment ?Upper Extremity Assessment: Overall WFL for tasks assessed;LUE deficits/detail ?LUE Deficits / Details: L wrist immobilized; elbow and fingers WFL ?  ? ?Lower Extremity Assessment ?Lower Extremity Assessment: RLE deficits/detail;LLE deficits/detail ?RLE Deficits / Details: R calf and ankle wrapped s/p I&D, lacking significant R ankle DF; hip and knee functionally >3/5 throughotu ?RLE: Unable to fully assess due to immobilization ?LLE Deficits / Details: L thigh wrapped s/p I&D; LLE functionally >3/5 throughout ?  ? ?Cervical / Trunk Assessment ?Cervical / Trunk Assessment: Normal  ?Communication  ? Communication: No difficulties  ?Cognition Arousal/Alertness: Awake/alert ?Behavior During Therapy: [facility] for tasks assessed/performed ?Overall Cognitive Status: Impaired/Different from baseline ?Area of Impairment: Attention ?  ?  ?  ?  ?  ?  ?  ?  ?  ?  ?  ?  ?  ?  ?   ?General Comments: pt reports moments of feeling disoriented ("I forget I'm at the hospital"), suspect related to prolonged admission/post-anesthesia (?). noted decreased attention during conversation - pt very focused on topics unrelated to current conversation; attempting to explain therex, pt focused on wanting to show pics of BLEs prior to sx. pt reports very worried about her mental health "coming down" from all of this ?  ?  ? ?  ?General Comments General comments (skin integrity, edema, etc.): Pt's husband present and supportive. Educ re: activity recommendations, initiating therex/ROM (especially related to R ankle), edema control, follow-up OP PT for return to sport. Pt reports significant concern regarding her mental health dealing with all of this, "I'm a runner... that's my identity... I know I'm going to crash after all of this..." Spoke with ortho PA Montez Morita) if their office has any resources to offer pt related to sports psych ? ?  ?Exercises Other Exercises ?Other Exercises: LUE elbow and finger wrist/flex AROM. Standing R calf stretch, bilateral LAQ; educ on supine SLR, did not perform. -- pt with decreased attention on therex educ, will provide HEP handout next session  ? ?Assessment/Plan  ?  ?PT Assessment Patient needs continued PT services  ?PT Problem List Decreased strength;Decreased range of motion;Decreased activity tolerance;Decreased balance;Decreased mobility;Decreased knowledge of precautions;Pain;Decreased knowledge of use of DME ? ?   ?  ?PT Treatment Interventions DME instruction;Stair training;Gait training;Functional mobility training;Therapeutic activities;Therapeutic exercise;Balance training;Patient/family education   ? ?PT Goals (Current goals can be found in the Care Plan  section)  ?Acute Rehab PT Goals ?Patient Stated Goal: be able to run a marathon again ?PT Goal Formulation: With patient ?Time For Goal Achievement: 10/17/21 ?Potential to Achieve Goals: Good ? ?   ?Frequency Min 5X/week ?  ? ? ?Co-evaluation   ?  ?  ?  ?  ? ? ?  ?AM-PAC PT "6 Clicks" Mobility  ?Outcome Measure Help needed turning from your back to your side while in a flat bed without using bedrails?: Non

## 2021-10-03 NOTE — Progress Notes (Signed)
CSW requested to speak with pt regarding mental health counseling resources.  Pt reports her injury is going to change some plans she had related to running several marathons this year, she would like to pursue extra support/counseling due to this.  CSW discussed with pt getting specific in network providers by [employer].  CSW also provided list of providers in the area. ?Lurline Idol, MSW, LCSW ?3/21/20231:57 PM  ?

## 2021-10-03 NOTE — Progress Notes (Signed)
? Jill Cox  [id number] DOB: 11-12-1975 DOA: 09/26/2021 ?PCP: Bubba Camp, [place]   ? ?Brief Narrative:  ?46 year old with a history of depression who was admitted to [facility] 09/26/2021 on transfer from [facility] with known MRSA bacteremia in the setting of a thoracic epidural abscess with concern for possible multiple septic joints.  She was originally admitted to [facility] 09/24/2021 complaining of polyarthralgia.  She had been seen for polyarthralgia in the [facility] ED 09/23/2021 at which time she was given Keflex for "cellulitis" and sent home.  Blood cultures ultimately grew MRSA and the patient was contacted to return to the ED.  Upon her return she was admitted and placed on IV vancomycin.  Repeat blood cultures were again positive for MRSA.  MRI of bilateral knees demonstrated moderate to large effusions worrisome for septic arthritis.  Right ankle MRI demonstrated effusion possibly septic in nature.  MRIs of bilateral wrists suggested tenosynovitis without overt septic arthritis.  Subsequent MRI of the thoracic spine noted an epidural abscess at T2/T3.  TEE was accomplished with no evidence of bacterial endocarditis. ? ?Significant Events: ?3/18 left forearm I&D with fourth dorsal compartment extensor tenosynovectomy ?3/20 I&D right gastrocnemius abscess, left thigh abscess - aspiration bilateral knee and right ankle joints ? ?Consultants:  ?Neurosurgery ?Orthopedics ?ID ? ?Code Status: FULL CODE ? ?DVT prophylaxis: ?Lovenox ? ?Interim Hx: ?Afebrile.  Vitals are stable.  No new findings on laboratory eval. reports some expected increased pain in her lower extremities postoperatively.  Awake alert and oriented.  No chest pain shortness of breath nausea or vomiting. ? ?Assessment & Plan: ? ?MRSA bacteremia -cervical and T2/T3 epidural abscess ?Initial blood cultures + 09/23/2021 - no evidence of endocarditis on TEE - repeat blood cultures 09/27/2021 positive - ID directing antibiotic therapy -changed from IV  vancomycin to IV daptomycin and teflaro due to persisting fevers - Neurosurgery consulted - further imaging raises concern for possible phlegmon at the L5 vertebral body and L4/L5 discitis - no surgical intervention currently suggested by Neurosurgery ? ?Bilateral wrist tenosynovitis ?Initially noted on MRI at [facility] but not felt to represent frank septic arthritis initially - Hand Surgery consulted 3/18 as CT 3/17 suggested abcess of L wrist/forearm - I&D of L wrist/forearm in OR 3/18 w/o frank pus encountered - cultures noting rare Staph aureus ? ?Possible bilateral septic knee arthritis - possible R gastroc and quad abscesses - L thigh abscess ?Initial concern raised by MRI accomplished at [facility] following - MRI R LE 3/19 suggests possible abscesses and probable septic arthritis of R knee - MRI L LE 3/19 confirmed new abscess of L thigh - to OR 3/20 for I&D and exploration  ? ?Depression ?Continue Zoloft  ? ?Anasarca - hypoalbuminemia ?Normal EF on TEE - venous duplex to rule out DVT negative B LE  ? ?Anemia ?No evidence of blood loss - likely due to bacteremia/smoldering infection -monitoring trend ? ?Hypokalemia ?Recurring -supplement and follow -magnesium normal ? ?Family Communication: No family present at time of exam ?Disposition: From home -hopeful for eventual return home but not yet medically stable ? ?Objective: ?Blood pressure (!) 101/45, pulse 69, temperature 97.6 ?F (36.4 ?C), temperature source Oral, resp. rate 16, height 5' 5.98" (1.676 m), weight 82 kg, SpO2 100 %. ? ?Intake/Output Summary (Last 24 hours) at 10/03/2021 0909 ?Last data filed at 10/02/2021 1508 ?Gross per 24 hour  ?Intake 1400 ml  ?Output 20 ml  ?Net 1380 ml  ? ? ?Filed Weights  ? 09/26/21 2336 09/30/21 0500  09/30/21 1548  ?Weight: 81.7 kg 82 kg 82 kg  ? ? ?Examination: ?General: No acute respiratory distress ?Lungs: Clear to auscultation bilaterally -no wheezing ?Cardiovascular: Regular rate and rhythm -no murmur  ?Abdomen:  Nontender, nondistended, soft, bowel sounds positive ?Extremities: All wounds dressed and dry at the present time -ongoing 3+ bilateral lower extremity pitting edema ? ?CBC: ?Recent Labs  ?Lab 09/28/21 ?0018 09/29/21 ?[id number] 09/30/21 ?1449 10/02/21 ?[id number] 10/03/21 ?[id number]  ?WBC 14.9* 14.5* 20.4* 15.0* 13.3*  ?NEUTROABS 11.4* 11.9* 16.8*  --   --   ?HGB 9.0* 8.2* 9.8* 9.1* 9.1*  ?HCT 27.4* 24.5* 29.8* 28.0* 27.8*  ?MCV 93.5 92.5 93.1 94.6 94.6  ?PLT 199 218 419* 534* 688*  ? ? ?Basic Metabolic Panel: ?Recent Labs  ?Lab 10/01/21 ?[id number] 10/02/21 ?[id number] 10/03/21 ?[id number]  ?NA 137 138 138  ?K 3.9 3.2* 3.7  ?CL 100 100 101  ?CO2 27 28 28   ?GLUCOSE 194* 117* 112*  ?BUN 12 16 16   ?CREATININE 0.62 0.67 0.72  ?CALCIUM 7.8* 7.7* 8.0*  ?MG 2.3 2.4 2.2  ?PHOS 3.0 3.8  --   ? ? ?GFR: ?Estimated Creatinine Clearance: 93.9 mL/min (by C-G formula based on SCr of 0.72 mg/dL). ? ?Liver Function Tests: ?Recent Labs  ?Lab 09/27/21 ?[id number] 09/29/21 ?[id number] 10/01/21 ?[id number]  ?AST 28 34 25  ?ALT 32 28 26  ?ALKPHOS 84 63 68  ?BILITOT 1.0 1.6* 0.8  ?PROT 4.6* 5.3* 5.6*  ?ALBUMIN <1.5* 3.0* 2.3*  ? ? ? ?Scheduled Meds: ? enoxaparin (LOVENOX) injection  40 mg Subcutaneous Q24H  ? feeding supplement  237 mL Oral BID BM  ? ferrous sulfate  325 mg Oral Q breakfast  ? furosemide  40 mg Intravenous Q12H  ? polyethylene glycol  17 g Oral Daily  ? senna-docusate  1 tablet Oral BID  ? sertraline  25 mg Oral q morning  ? tobramycin  40 mg Intramuscular Once  ? ?Continuous Infusions: ? ceFTAROline (TEFLARO) IV 600 mg (10/03/21 0513)  ? DAPTOmycin (CUBICIN)  IV 650 mg (10/02/21 1925)  ? ? ? LOS: 7 days  ? ?Cherene Altes, MD ?[facility] ?Office  [phone] ?Pager - Text Page per Shea Evans ? ?If 7PM-7AM, please contact night-coverage per Amion ?10/03/2021, 9:09 AM ? ? ? ? ? ?

## 2021-10-03 NOTE — Progress Notes (Signed)
?   ? ? ? ? ?[place] for Infectious Disease ? ?Date of Admission:  09/26/2021    ? ? ?Lines: ?Peripheral iv's ? ?Abx: ?3/16-c daptomycin/ceftaroline ? ?3/12-3/15 vancomycin ? ? ?ASSESSMENT: ?46 yo female admitted in transfer from [place] on 3/14 for severe metastatic mrsa sepsis ? ?No obvious risk factor for mrsa bsi ? ?She developed sx of polyarthralgia/bodyache and malaise 1-2 weeks prior to admission ? ?Initial evaluation at [facility] positive for bcx 3/11 (persistent daily until 3/13), all mri (thoracic/cervical spine), knees, wrists, femurs, right ankle are positive for soft tissue pyogenic focus and the spine mri series suggest posterior epidural abscess from cervical to upper thoracic area. L-spine mri shows potential C2 involvement as well  ? ?3/21 assessment ?3/16 and 3/19 bcx ngtd, since dapto/ceftaroline started ?3/18 s/p left wrist I&D --> patient said pain much better there; cx staph aureus ?3/20 s/p I&D right calf and left thigh; cx gpc left thigh ?3/20 left knee and right ankle aspirate as well- negative gram stain ? ?Wbc continues to improve ? ?Tolerating abx well ? ?PLAN: ?Continue dapto/ceftaroline until 3/30 then switch back to monotherapy either with vanc or dapto  ?Weekly cpk, cbc, cmp, crp ?If a picc is to be placed, I suspect best to do it after all surgery is done, as often this induce bacteremia that might seed the central line  ?Discussed with primary team ? ? ? ? ? ?Principal Problem: ?  MRSA bacteremia ?Active Problems: ?  Depression ?  Septic joint ([place]) ?  Epidural abscess ?  Tenosynovitis ?  Hypoalbuminemia: Severe ?  Hypokalemia ?  Constipation ?  Anemia ?  Edema due to hypervolemia ? ? ?No Known Allergies ? ?Scheduled Meds: ? enoxaparin (LOVENOX) injection  40 mg Subcutaneous Q24H  ? feeding supplement  237 mL Oral BID BM  ? ferrous sulfate  325 mg Oral Q breakfast  ? furosemide  40 mg Intravenous Q12H  ? polyethylene glycol  17 g Oral Daily  ? senna-docusate  1 tablet Oral BID  ?  sertraline  25 mg Oral q morning  ? tobramycin  40 mg Intramuscular Once  ? ?Continuous Infusions: ? ceFTAROline (TEFLARO) IV 600 mg (10/03/21 0513)  ? DAPTOmycin (CUBICIN)  IV 650 mg (10/02/21 1925)  ? ?PRN Meds:.acetaminophen **OR** [DISCONTINUED] acetaminophen, cyclobenzaprine, HYDROcodone-acetaminophen, HYDROmorphone (DILAUDID) injection, naLOXone (NARCAN)  injection, ondansetron (ZOFRAN) IV ? ? ?SUBJECTIVE: ?S/p I&D left thigh and right calf ?Left knee and right ankle aspirated as well ?Patient complains moderate-severe pain left thigh  ? ?No fever ?No n/v/diarrhea ? ?afebrile ? ? ?Review of Systems: ?ROS ?All other ROS was negative, except mentioned above ? ? ? ? ?OBJECTIVE: ?Vitals:  ? 10/02/21 1606 10/02/21 2025 10/03/21 0516 10/03/21 0755  ?BP: 124/72 107/64 (!) 114/56 (!) 101/45  ?Pulse: 67 74 76 69  ?Resp:  15 16 16   ?Temp: (!) 97.5 ?F (36.4 ?C) 98.2 ?F (36.8 ?C) 97.9 ?F (36.6 ?C) 97.6 ?F (36.4 ?C)  ?TempSrc: Oral Oral Oral Oral  ?SpO2: 100% 96% 96% 100%  ?Weight:      ?Height:      ? ?Body mass index is 29.19 kg/m?. ? ?Physical Exam ?General/constitutional: no distress, pleasant ?HEENT: Normocephalic, PER, Conj Clear, EOMI, Oropharynx clear ?Neck supple ?CV: rrr no mrg ?Lungs: clear to auscultation, normal respiratory effort ?Abd: Soft, Nontender ? ?Ext/Skin/msk: LUE dressing c/d/I with good rom in wrist/fingers; bilateral LE edematous and tender in calf/thigh, but intact rom in knees/ankles; left thigh and right calf dressing  c/d/I. Back nontender ? ?Neuro: nonfocal ? ? ? ? ? ?Lab Results ?Lab Results  ?Component Value Date  ? WBC 13.3 (H) 10/03/2021  ? HGB 9.1 (L) 10/03/2021  ? HCT 27.8 (L) 10/03/2021  ? MCV 94.6 10/03/2021  ? PLT 688 (H) 10/03/2021  ?  ?Lab Results  ?Component Value Date  ? CREATININE 0.72 10/03/2021  ? BUN 16 10/03/2021  ? NA 138 10/03/2021  ? K 3.7 10/03/2021  ? CL 101 10/03/2021  ? CO2 28 10/03/2021  ?  ?Lab Results  ?Component Value Date  ? ALT 26 10/01/2021  ? AST 25 10/01/2021   ? ALKPHOS 68 10/01/2021  ? BILITOT 0.8 10/01/2021  ?  ? ? ?Microbiology: ?Recent Results (from the past 240 hour(s))  ?Blood culture (single)     Status: Abnormal  ? Collection Time: 09/23/21  1:53 PM  ? Specimen: BLOOD  ?Result Value Ref Range Status  ? Specimen Description   Final  ?  BLOOD RIGHT ANTECUBITAL ?Performed at [facility], [address], [place], [place] [address] ?  ? Special Requests   Final  ?  BOTTLES DRAWN AEROBIC AND ANAEROBIC Blood Culture adequate volume ?Performed at [facility], [address] [place], [place] [address] ?  ? Culture  Setup Time   Final  ?  IN BOTH AEROBIC AND ANAEROBIC BOTTLES ?GRAM POSITIVE COCCI ?CRITICAL RESULT CALLED TO, READ BACK BY AND VERIFIED WITH: ?Paulina Fusi [facility] AT [id number] 09/24/2021 GAA ?Performed at [facility], [place] [address] [place], [place] [address] ?  ? Culture METHICILLIN RESISTANT STAPHYLOCOCCUS AUREUS (A)  Final  ? Report Status 09/26/2021 FINAL  Final  ? Organism ID, Bacteria METHICILLIN RESISTANT STAPHYLOCOCCUS AUREUS  Final  ?    Susceptibility  ? Methicillin resistant staphylococcus aureus - MIC*  ?  CIPROFLOXACIN <=0.5 SENSITIVE Sensitive   ?  ERYTHROMYCIN <=0.25 SENSITIVE Sensitive   ?  GENTAMICIN <=0.5 SENSITIVE Sensitive   ?  OXACILLIN >=4 RESISTANT Resistant   ?  TETRACYCLINE <=1 SENSITIVE Sensitive   ?  VANCOMYCIN 1 SENSITIVE Sensitive   ?  TRIMETH/SULFA <=10 SENSITIVE Sensitive   ?  CLINDAMYCIN <=0.25 SENSITIVE Sensitive   ?  RIFAMPIN <=0.5 SENSITIVE Sensitive   ?  Inducible Clindamycin NEGATIVE Sensitive   ?  * METHICILLIN RESISTANT STAPHYLOCOCCUS AUREUS  ?Blood Culture ID Panel (Reflexed)     Status: Abnormal  ? Collection Time: 09/23/21  1:53 PM  ?Result Value Ref Range Status  ? Enterococcus faecalis NOT DETECTED NOT DETECTED Final  ? Enterococcus Faecium NOT DETECTED NOT DETECTED Final  ? Listeria monocytogenes NOT DETECTED NOT DETECTED Final  ? Staphylococcus species DETECTED (A) NOT DETECTED Final  ?   Comment: CRITICAL RESULT CALLED TO, READ BACK BY AND VERIFIED WITH: ?Paulina Fusi PHARMD AT 0522 09/24/2021 GAA ?  ? Staphylococcus aureus (BCID) DETECTED (A) NOT DETECTED Final  ?  Comment: Methicillin (oxacillin)-resistant Staphylococcus aureus (MRSA). MRSA is predictably resistant to beta-lactam antibiotics (except ceftaroline). Preferred therapy is vancomycin unless clinically contraindicated. Patient requires contact precautions if  ?hospitalized. ?CRITICAL RESULT CALLED TO, READ BACK BY AND VERIFIED WITH: ?Paulina Fusi PHARMD AT [id number] 09/24/2021 GAA ?  ? Staphylococcus epidermidis NOT DETECTED NOT DETECTED Final  ? Staphylococcus lugdunensis NOT DETECTED NOT DETECTED Final  ? Streptococcus species NOT DETECTED NOT DETECTED Final  ? Streptococcus agalactiae NOT DETECTED NOT DETECTED Final  ? Streptococcus pneumoniae NOT DETECTED NOT DETECTED Final  ? Streptococcus pyogenes NOT DETECTED NOT DETECTED Final  ?  A.calcoaceticus-baumannii NOT DETECTED NOT DETECTED Final  ? Bacteroides fragilis NOT DETECTED NOT DETECTED Final  ? Enterobacterales NOT DETECTED NOT DETECTED Final  ? Enterobacter cloacae complex NOT DETECTED NOT DETECTED Final  ? Escherichia coli NOT DETECTED NOT DETECTED Final  ? Klebsiella aerogenes NOT DETECTED NOT DETECTED Final  ? Klebsiella oxytoca NOT DETECTED NOT DETECTED Final  ? Klebsiella pneumoniae NOT DETECTED NOT DETECTED Final  ? Proteus species NOT DETECTED NOT DETECTED Final  ? Salmonella species NOT DETECTED NOT DETECTED Final  ? Serratia marcescens NOT DETECTED NOT DETECTED Final  ? Haemophilus influenzae NOT DETECTED NOT DETECTED Final  ? Neisseria meningitidis NOT DETECTED NOT DETECTED Final  ? Pseudomonas aeruginosa NOT DETECTED NOT DETECTED Final  ? Stenotrophomonas maltophilia NOT DETECTED NOT DETECTED Final  ? Candida albicans NOT DETECTED NOT DETECTED Final  ? Candida auris NOT DETECTED NOT DETECTED Final  ? Candida glabrata NOT DETECTED NOT DETECTED Final  ? Candida krusei NOT  DETECTED NOT DETECTED Final  ? Candida parapsilosis NOT DETECTED NOT DETECTED Final  ? Candida tropicalis NOT DETECTED NOT DETECTED Final  ? Cryptococcus neoformans/gattii NOT DETECTED NOT DETECTED Final  ? Meth r

## 2021-10-03 NOTE — Plan of Care (Signed)
?  Problem: Education: Goal: Knowledge of General Education information will improve Description: Including pain rating scale, medication(s)/side effects and non-pharmacologic comfort measures Outcome: Progressing   Problem: Clinical Measurements: Goal: Will remain free from infection Outcome: Progressing   Problem: Activity: Goal: Risk for activity intolerance will decrease Outcome: Progressing   Problem: Nutrition: Goal: Adequate nutrition will be maintained Outcome: Progressing   Problem: Pain Managment: Goal: General experience of comfort will improve Outcome: Progressing   

## 2021-10-03 NOTE — Progress Notes (Signed)
[facility]  ? ?Pt was off the floor while I was up there  ?RN stated she went outside with husband ?I went down to the fountain area in the front but did not see her  ? ?Will see tomorrow  ? ?Gram stain from L thigh shows gram + cocci ?Gram stain from all other sites with no organisms noted  ?Noted that right knee did not have gram stain. I personally spoke with lab, they have the specimen and will re-plate and read ? ?Cell count on R knee synovial fluid with 9,200 WBC which customarily is not consistent with septic joint ?Not enough fluid from R ankle or L knee for cell count analysis  ? ?WBAT B LEx ?No motion restrictions ? ?No plans to return to OR  ?Dressing changes to R calf and L thigh tomorrow. Reinforce as needed ? ?Continue per ID  ? ?Jari Pigg, PA-C ?[phone] (C) ?10/03/2021, 1:04 PM ? ?[facility] ?[place] [address] ?[phone] Jenetta Downer) ?[phone] (F) ? ?  ? ? ?Patient ID: Jill Cox, female   DOB: [birth year], 46 y.o.   MRN: [id number] ? ?

## 2021-10-04 ENCOUNTER — Inpatient Hospital Stay: Payer: Self-pay

## 2021-10-04 DIAGNOSIS — R7881 Bacteremia: Secondary | ICD-10-CM | POA: Diagnosis not present

## 2021-10-04 DIAGNOSIS — B9562 Methicillin resistant Staphylococcus aureus infection as the cause of diseases classified elsewhere: Secondary | ICD-10-CM | POA: Diagnosis not present

## 2021-10-04 DIAGNOSIS — G062 Extradural and subdural abscess, unspecified: Secondary | ICD-10-CM | POA: Diagnosis not present

## 2021-10-04 LAB — BASIC METABOLIC PANEL
Anion gap: 10 (ref 5–15)
BUN: 15 mg/dL (ref 6–20)
CO2: 28 mmol/L (ref 22–32)
Calcium: 8.6 mg/dL — ABNORMAL LOW (ref 8.9–10.3)
Chloride: 100 mmol/L (ref 98–111)
Creatinine, Ser: 0.82 mg/dL (ref 0.44–1.00)
GFR, Estimated: >60 mL/min (ref >60)
Glucose, Bld: 89 mg/dL (ref 70–99)
Potassium: 3.5 mmol/L (ref 3.5–5.1)
Sodium: 138 mmol/L (ref 135–145)

## 2021-10-04 LAB — CBC
HCT: 28.3 % — ABNORMAL LOW (ref 36.0–46.0)
Hemoglobin: 8.9 g/dL — ABNORMAL LOW (ref 12.0–15.0)
MCH: 30.6 pg (ref 26.0–34.0)
MCHC: 31.4 g/dL (ref 30.0–36.0)
MCV: 97.3 fL (ref 80.0–100.0)
Platelets: 795 10*3/uL — ABNORMAL HIGH (ref 150–400)
RBC: 2.91 MIL/uL — ABNORMAL LOW (ref 3.87–5.11)
RDW: 14.5 % (ref 11.5–15.5)
WBC: 13.3 10*3/uL — ABNORMAL HIGH (ref 4.0–10.5)
nRBC: 0.2 % (ref 0.0–0.2)

## 2021-10-04 LAB — CULTURE, BLOOD (ROUTINE X 2)
Culture: NO GROWTH
Culture: NO GROWTH
Special Requests: ADEQUATE

## 2021-10-04 MED ORDER — CHLORHEXIDINE GLUCONATE CLOTH 2 % EX PADS
6.0000 | MEDICATED_PAD | Freq: Every day | CUTANEOUS | Status: DC
  Administered 2021-10-05 – 2021-10-06 (×2): 6 via TOPICAL

## 2021-10-04 MED ORDER — SODIUM CHLORIDE 0.9% FLUSH
10.0000 mL | INTRAVENOUS | Status: DC | PRN
  Administered 2021-10-06: 10 mL

## 2021-10-04 MED ORDER — SODIUM CHLORIDE 0.9% FLUSH
10.0000 mL | Freq: Two times a day (BID) | INTRAVENOUS | Status: DC
  Administered 2021-10-05 (×2): 10 mL

## 2021-10-04 NOTE — Progress Notes (Signed)
?PROGRESS NOTE ? ?Jill Cox  ZOX:[id number] DOB: May 18, 1976 DOA: 09/26/2021 ?PCP: Cleon DewMulholland, Andrea, FNP  ? ?Brief Narrative: ?Jill Cox is a 46 y.o. female with medical history significant for depression who is [facility] to [facility] on 09/26/2021 by way of transfer from [facility] with MRSA bacteremia in the setting of epidural abscess of the thoracic spine, multiple suspected septic joints, after [facility] ED [facility] 09/24/[facility]21 complaining of polyarthralgias.  ?Patient began experiencing polyarthralgias with distribution that included bilateral wrist, bilateral knees, and right ankle.  Her PCP initiated 3-day course of prednisone, without any ensuing improvement in the symptoms.  Given the ongoing nature of these polyarthralgias, the patient then presented [facility] on 09/23/2021 at which time she was started on Keflex and d/ced, presumably for associated cellulitis.  Blood cultures drawn at the time of the ED visit ultimately grew MRSA, and the patient was contacted the subsequent day and instructed to return back [facility] ED for further evaluation and management, leading to her formal admission on 09/24/2021 for MRSA bacteremia. She was started on IV vancomycin, with repeat blood cultures also reportedly positive for MRSA. She underwent MRI of the bilateral knees demonstrating moderate to large effusions concerning for septic arthritis.  She also underwent right ankle MRI, which demonstrated evidence of effusion, potentially septic in nature.  Additionally, MRIs of the bilateral wrists showed evidence of tenosynovitis without evidence of overt septic arthritis.  Subsequently, MRI of the thoracic spine showed evidence of epidural abscess at T2/T3.  Cardiology was consulted, and ultimately the patient underwent TTE as well as TEE which showed no evidence of bacterial endocarditis. Subsequently, the patient was transferred to [facility] for further evaluation and management of her MRSA bacteremia,  epidural abscess of the thoracic spine, and multiple suspected septic joints requiring extensive washout procedure by orthopedic surgery, as above. ?ID, orthopedic surgery, neurosurgery following. ?   ?Significant events: ?3/18 left forearm I&D with fourth dorsal compartment extensor tenosynovectomy ?3/20 I&D right gastrocnemius abscess, left thigh abscess - aspiration bilateral knee and right ankle joints ? ? ?Assessment & Plan: ? ?Principal Problem: ?  MRSA bacteremia ?Active Problems: ?  Septic joint (HCC) ?  Epidural abscess ?  Depression ?  Hypoalbuminemia: Severe ?  Hypokalemia ?  Constipation ?  Anemia ?  Edema due to hypervolemia ? ? ?Assessment and Plan: ?* MRSA bacteremia ? - Multiple potential sources for MRSA bacteremia, including epidural abscess associated with T2/T3, with multiple potential septic joints including the bilateral knees. ?-TEE demonstrated no evidence of bacterial endocarditis ?-ID following and due to ongoing positive blood cultures, ongoing fevers IV antibiotics have been changed from IV vancomycin to IV daptomycin and IV teflaro.  Plan to continue on Dapto/ceftaroline until 3/30 then suspected daptomycin.Plan for PICC line placement  ? ? ?Septic joint (HCC) ?-Moderate to large effusions identified on bilateral knees per MRIs performed at [facility], concerning for septic arthritis in bilateral distribution, in the setting of presenting MRSA bacteremia. ?-CT 3/17 suggested abcess of L wrist/forearm .MRI R LE 3/19 suggests possible abscesses and probable septic arthritis of R knee - MRI L LE 3/19 confirmed new abscess of L thigh . ?-S/P  left forearm I&D with fourth dorsal compartment extensor tenosynovectomy,I&D right gastrocnemius abscess, left thigh abscess - aspiration bilateral knee and right ankle joints ?No further plan for operative intervention as per orthopedics ? ?Epidural abscess ?MRI of the thoracic spine performed during [facility] suggestive of epidural abscess at  T2/T3, without  associated acute focal neurologic deficits.  Neurosurgery thinks that there is no evidence of cord compression . ?-MRI of the C-spine with dorsal epidural abscess throughout the cervical spine contributing to moderate spinal stenosis.  No cord signal abnormality. ?-Per neurosurgery no surgical intervention at this time and recommending continuation of IV antibiotics. ? ?  ? ?Depression ?-Zoloft. ?  ? ?Edema due to hypervolemia ?- Patient with anasarca ?-2D echo/TEE done during this admission with normal EF,NWMA. ?-Unclear etiology but hypoalbuminemia might have contributed ?-On IV lasix,will continue for now ? ?Anemia ?-Patient with no overt bleeding ?-Anemia panel showed low iron of 17,given IV iron ?-Follow H&H. ?-Hb stable ? ?Constipation ?-On MiraLAX daily, Senokot-S twice daily. ? ?Hypokalemia ?- Monitor and supplement as needed ? ?Hypoalbuminemia: Severe ?- Patient noted on lab work to have a albumin level of < 1.5 from 2.7 on 09/23/2021. ?-unclear etiology ?-Patient was given IV albumin ?-Placed on Ensure supplementation. ?-Diet has been liberalized to a regular diet. ? ? ? ? ? ? ? ?  ?  ? ?DVT prophylaxis:SCD's Start: 09/30/21 1833 ?enoxaparin (LOVENOX) injection 40 mg Start: 09/29/21 1300 ? ? ?  Code Status: Full Code ? ?Family Communication:  ? ?Patient status: Inpatient ? ?Patient is from : Home ? ?Anticipated discharge to: Not sure at this point ? ?Estimated DC date: Not sure at this point, might end up with prolonged hospitalization ? ? ?Consultants: Orthopedics, neurosurgery, ID ? ?Procedures: None ? ?Antimicrobials:  ?Anti-infectives (From admission, onward)  ? ? Start     Dose/Rate Route Frequency Ordered Stop  ? 10/02/21 1310  vancomycin (VANCOCIN) powder  Status:  Discontinued       ?   As needed 10/02/21 1310 10/02/21 1508  ? 09/30/21 1030  tobramycin (NEBCIN) injection 40 mg       ? 40 mg Intramuscular  Once 09/30/21 0945    ? 09/28/21 1030  DAPTOmycin (CUBICIN) 650 mg in sodium  chloride 0.9 % IVPB       ? 8 mg/kg ? 81.7 kg ?126 mL/hr over 30 Minutes Intravenous Daily 09/28/21 0932    ? 09/28/21 1030  ceftaroline (TEFLARO) 600 mg in sodium chloride 0.9 % 100 mL IVPB       ? 600 mg ?100 mL/hr over 60 Minutes Intravenous Every 8 hours 09/28/21 0932    ? 09/27/21 0215  vancomycin (VANCOCIN) IVPB 1000 mg/200 mL premix  Status:  Discontinued       ? 1,000 mg ?200 mL/hr over 60 Minutes Intravenous 2 times daily 09/27/21 0115 09/28/21 0932  ? ?  ? ? ?Subjective: ?Patient seen and examined at the bedside this afternoon.  Hemodynamically stable.  Looks much better than few days ago.  Pain is well controlled.  Denies new complaints ? ?Objective: ?Vitals:  ? 10/03/21 1424 10/03/21 1934 10/04/21 0544 10/04/21 0837  ?BP: (!) 106/45 (!) 115/53 131/61 (!) 95/39  ?Pulse: 75 72 90 80  ?Resp: 17 16 15 17   ?Temp: 98.2 ?F (36.8 ?C) 98.3 ?F (36.8 ?C) 98.3 ?F (36.8 ?C) 98.6 ?F (37 ?C)  ?TempSrc: Oral Oral Oral Oral  ?SpO2: 97% 100% 96% 100%  ?Weight:      ?Height:      ? ? ?Intake/Output Summary (Last 24 hours) at 10/04/2021 1313 ?Last data filed at 10/04/2021 0535 ?Gross per 24 hour  ?Intake 189 ml  ?Output --  ?Net 189 ml  ? ?Filed Weights  ? 09/26/21 2336 09/30/21 0500 09/30/21 1548  ?Weight: 81.7 kg 82 kg 82  kg  ? ? ?Examination: ? ? ?General exam: Overall comfortable, not in distress ?HEENT: PERRL ?Respiratory system:  no wheezes or crackles  ?Cardiovascular system: S1 & S2 heard, RRR.  ?Gastrointestinal system: Abdomen is nondistended, soft and nontender. ?Central nervous system: Alert and oriented ?Extremities: Anasarca, left upper extremity wrapped with dressing, right lower extremity wrapped with dressing  ?skin: No rashes, no ulcers,no icterus   ? ? ?Data Reviewed: I have personally reviewed following labs and imaging studies ? ?CBC: ?Recent Labs  ?Lab 09/28/21 ?0018 09/29/21 ?[id number] 09/30/21 ?1449 10/02/21 ?[id number] 10/03/21 ?[id number] 10/04/21 ?0701  ?WBC 14.9* 14.5* 20.4* 15.0* 13.3* 13.3*  ?NEUTROABS 11.4*  11.9* 16.8*  --   --   --   ?HGB 9.0* 8.2* 9.8* 9.1* 9.1* 8.9*  ?HCT 27.4* 24.5* 29.8* 28.0* 27.8* 28.3*  ?MCV 93.5 92.5 93.1 94.6 94.6 97.3  ?PLT 199 218 419* 534* 688* 795*  ? ?Basic Metabolic Panel: ?Recent

## 2021-10-04 NOTE — Progress Notes (Signed)
? ?                              Orthopaedic Trauma Service Progress Note ? ?Patient ID: ?Jill Cox ?MRN: [id number] ?DOB/AGE: 1975-11-30 46 y.o. ? ?Subjective: ? ?Doing fair  ?Most painful area is R calf  ? ?Wants to know why this infection developed  ?Was feeling well until the beginning of march ?She and her husband had just returned home from [facility] where they are working on their retirement property.  Denies and injuries, bites, etc ?Had severe back pain upon getting out of car.  Presented to [facility] where she received an injection, this was a Sunday (3/5).  Was started on steroids that Thursday.  She had gradually worsened since that time  ? ? ?She does vape x 5 years, smoker prior to that  ?No diabetes ?No other autoimmune diseases that she knows about  ? ?No changes of note from intra-op cultures  ?Joint aspirations have not demonstrated any bacteria  ? ?ROS ?As above ? ?Objective:  ? ?VITALS:   ?Vitals:  ? 10/03/21 1424 10/03/21 1934 10/04/21 0544 10/04/21 0837  ?BP: (!) 106/45 (!) 115/53 131/61 (!) 95/39  ?Pulse: 75 72 90 80  ?Resp: 17 16 15 17   ?Temp: 98.2 ?F (36.8 ?C) 98.3 ?F (36.8 ?C) 98.3 ?F (36.8 ?C) 98.6 ?F (37 ?C)  ?TempSrc: Oral Oral Oral Oral  ?SpO2: 97% 100% 96% 100%  ?Weight:      ?Height:      ? ? ?Estimated body mass index is 29.19 kg/m? as calculated from the following: ?  Height as of this encounter: 5' 5.98" (1.676 m). ?  Weight as of this encounter: 82 kg. ? ? ?Intake/Output   ?   03/21 0701 ?03/22 0700 03/22 0701 ?03/23 0700  ? I.V. (mL/kg)    ? IV Piggyback 189   ? Total Intake(mL/kg) 189 (2.3)   ? Blood    ? Total Output    ? Net +189   ?     ?  ? ?LABS ? ?Results for orders placed or performed during the hospital encounter of 09/26/21 (from the past 24 hour(s))  ?CBC     Status: Abnormal  ? Collection Time: 10/04/21  7:01 AM  ?Result Value Ref Range  ? WBC 13.3 (H) 4.0 - 10.5 K/uL  ? RBC 2.91 (L) 3.87 - 5.11 MIL/uL  ? Hemoglobin 8.9 (L) 12.0 - 15.0  g/dL  ? HCT 28.3 (L) 36.0 - 46.0 %  ? MCV 97.3 80.0 - 100.0 fL  ? MCH 30.6 26.0 - 34.0 pg  ? MCHC 31.4 30.0 - 36.0 g/dL  ? RDW 14.5 11.5 - 15.5 %  ? Platelets 795 (H) 150 - 400 K/uL  ? nRBC 0.2 0.0 - 0.2 %  ?Basic metabolic panel     Status: Abnormal  ? Collection Time: 10/04/21  7:01 AM  ?Result Value Ref Range  ? Sodium 138 135 - 145 mmol/L  ? Potassium 3.5 3.5 - 5.1 mmol/L  ? Chloride 100 98 - 111 mmol/L  ? CO2 28 22 - 32 mmol/L  ? Glucose, Bld 89 70 - 99 mg/dL  ? BUN 15 6 - 20 mg/dL  ? Creatinine, Ser 0.82 0.44 - 1.00 mg/dL  ? Calcium 8.6 (L) 8.9 - 10.3 mg/dL  ? GFR, Estimated >60 >60 mL/min  ? Anion gap 10 5 - 15  ? ? ? ?PHYSICAL EXAM:  ? ?  Gen: in bed, NAD, husband at bedside  ?Lungs: unlabored ?Cardiac: reg  ?Ext:  ?     Left thigh  ? Dressing removed ? Incision stable  ? No drainage ? Decreased erythema ? Motor and sensory functions intact ? ?     Right Lower Extremity  ? Swelling stable ? Decreased erythema ? + warmth  ? Blood drainage form medial calf incision  ? Penrose drains are stable ? Ext warm  ? Swelling improved ? ? ?Assessment/Plan: ?2 Days Post-Op  ? ? ? ? ?Anti-infectives (From admission, onward)  ? ? Start     Dose/Rate Route Frequency Ordered Stop  ? 10/02/21 1310  vancomycin (VANCOCIN) powder  Status:  Discontinued       ?   As needed 10/02/21 1310 10/02/21 1508  ? 09/30/21 1030  tobramycin (NEBCIN) injection 40 mg       ? 40 mg Intramuscular  Once 09/30/21 0945    ? 09/28/21 1030  DAPTOmycin (CUBICIN) 650 mg in sodium chloride 0.9 % IVPB       ? 8 mg/kg ? 81.7 kg ?126 mL/hr over 30 Minutes Intravenous Daily 09/28/21 0932    ? 09/28/21 1030  ceftaroline (TEFLARO) 600 mg in sodium chloride 0.9 % 100 mL IVPB       ? 600 mg ?100 mL/hr over 60 Minutes Intravenous Every 8 hours 09/28/21 0932    ? 09/27/21 0215  vancomycin (VANCOCIN) IVPB 1000 mg/200 mL premix  Status:  Discontinued       ? 1,000 mg ?200 mL/hr over 60 Minutes Intravenous 2 times daily 09/27/21 0115 09/28/21 0932  ? ?  ?. ? ?POD/HD#:  2 ? ?46 y/o MRSA bacteremia, diffuse myofascitis/cellulitis  ?  ?- R calf abscess s/p I&D ? Dressing changed today  ? Change again on Friday ? Compression socks ? Ice/elevate ? ? WBAT ? ?- L thigh abscess/myositis/fascitis s/p I&D ? Dressing changed today  ? Changes as needed ? WBAT L leg ? ?- R knee, L knee, R ankle effusions ? Joint aspirations have not demonstrated any bacteria and they do not appear septic  ?             ?- ID ? Abx per ID service  ?  ?  ?- Dispo: ?            Ortho issues stable ? Discussed with pt that her recovery will take a while  ? ? No further plans for OR  ? ?Jari Pigg, PA-C ?[phone] (C) ?10/04/2021, 10:32 AM ? ?[facility] ?[place] [phone] ?[phone] Jenetta Downer) ?[phone] (F) ? ? ? ?After 5pm and on the weekends please log on to Amion, go to orthopaedics and the look under the [facility] Call for the provider(s) on call. You can also call our office at [phone] and then follow the prompts to be connected to the call team.  ? Patient ID: Jill Cox, female   DOB: Aug 03, 1975, 46 y.o.   MRN: [id number] ? ?

## 2021-10-04 NOTE — Progress Notes (Signed)
?   ? ? ? ? ?[facility] for Infectious Disease ? ?Date of Admission:  09/26/2021    ? ? ?Lines: ?Peripheral iv's ? ?Abx: ?3/16-c daptomycin/ceftaroline ? ?3/12-3/15 vancomycin ? ? ?ASSESSMENT: ?46 yo female admitted in transfer from [place] on 3/14 for severe metastatic mrsa sepsis ? ?No obvious risk factor for mrsa bsi ? ?She developed sx of polyarthralgia/bodyache and malaise 1-2 weeks prior to admission ? ?[facility] for bcx 3/11 (persistent daily until 3/13), all mri (thoracic/cervical spine), knees, wrists, femurs, right ankle are positive for soft tissue pyogenic focus and the spine mri series suggest posterior epidural abscess from cervical to upper thoracic area. L-spine mri shows potential C2 involvement as well  ? ?------ ?3/22 assessment ?3/16 and 3/19 bcx ngtd, since dapto/ceftaroline started ?3/18 s/p left wrist I&D --> cx staph aureus ?3/20 s/p I&D right calf and left thigh; cx staph aureus ?3/20 left knee and right ankle/knee aspirate as well- negative gram stain and culture -- clinically doesn't appear involved ? ?Surgery team relate no further plan for surgery as of 3/22. Platelet reactive rise noted  ? ? ?PLAN: ?Continue dapto/ceftaroline until 3/30 then switch back to daptomycin (both sensitive) ?Continue weekly cpk, cbc, cmp, crp ?Discussed with surgery team. Ok to place PICC today ?Discussed with primary team ? ? ? ? ?Principal Problem: ?  MRSA bacteremia ?Active Problems: ?  Depression ?  Septic joint ([place]) ?  Epidural abscess ?  Tenosynovitis ?  Hypoalbuminemia: Severe ?  Hypokalemia ?  Constipation ?  Anemia ?  Edema due to hypervolemia ? ? ?No Known Allergies ? ?Scheduled Meds: ? enoxaparin (LOVENOX) injection  40 mg Subcutaneous Q24H  ? feeding supplement  237 mL Oral BID BM  ? ferrous sulfate  325 mg Oral Q breakfast  ? furosemide  40 mg Intravenous Q12H  ? polyethylene glycol  17 g Oral Daily  ? senna-docusate  1 tablet Oral BID  ? sertraline  25 mg Oral q  morning  ? tobramycin  40 mg Intramuscular Once  ? ?Continuous Infusions: ? ceFTAROline (TEFLARO) IV 600 mg (10/04/21 0334)  ? DAPTOmycin (CUBICIN)  IV Stopped (10/03/21 2101)  ? ?PRN Meds:.acetaminophen **OR** [DISCONTINUED] acetaminophen, cyclobenzaprine, HYDROcodone-acetaminophen, HYDROmorphone (DILAUDID) injection, naLOXone (NARCAN)  injection, ondansetron (ZOFRAN) IV ? ? ?SUBJECTIVE: ?Felt well. Pain mostly right calf now but better ?No n/v/diarrhea ?All wound cx growing staph ?Bilateral knees and right ankle joint cx negative ? ? ?Review of Systems: ?ROS ?All other ROS was negative, except mentioned above ? ? ? ? ?OBJECTIVE: ?Vitals:  ? 10/03/21 1424 10/03/21 1934 10/04/21 0544 10/04/21 0837  ?BP: (!) 106/45 (!) 115/53 131/61 (!) 95/39  ?Pulse: 75 72 90 80  ?Resp: 17 16 15 17   ?Temp: 98.2 ?F (36.8 ?C) 98.3 ?F (36.8 ?C) 98.3 ?F (36.8 ?C) 98.6 ?F (37 ?C)  ?TempSrc: Oral Oral Oral Oral  ?SpO2: 97% 100% 96% 100%  ?Weight:      ?Height:      ? ?Body mass index is 29.19 kg/m?. ? ?Physical Exam ?General/constitutional: no distress, pleasant ?HEENT: Normocephalic, PER, Conj Clear, EOMI, Oropharynx clear ?Neck supple ?CV: rrr no mrg ?Lungs: clear to auscultation, normal respiratory effort ?Abd: Soft, Nontender ? ?Ext/Skin/msk: LUE non-occlusive dressing clean/dry; no surrounding cellulitis; some swelling still but improved; overall bilateral LE swelling had improved; right calf dressing c/d and moderately tender still. Intact ankle/knees active rom ? ?Neuro: nonfocal ? ? ? ? ? ?Lab Results ?Lab Results  ?Component Value Date  ?  WBC 13.3 (H) 10/04/2021  ? HGB 8.9 (L) 10/04/2021  ? HCT 28.3 (L) 10/04/2021  ? MCV 97.3 10/04/2021  ? PLT 795 (H) 10/04/2021  ?  ?Lab Results  ?Component Value Date  ? CREATININE 0.82 10/04/2021  ? BUN 15 10/04/2021  ? NA 138 10/04/2021  ? K 3.5 10/04/2021  ? CL 100 10/04/2021  ? CO2 28 10/04/2021  ?  ?Lab Results  ?Component Value Date  ? ALT 26 10/01/2021  ? AST 25 10/01/2021  ? ALKPHOS 68  10/01/2021  ? BILITOT 0.8 10/01/2021  ?  ? ? ?Microbiology: ?Recent Results (from the past 240 hour(s))  ?Urine Culture     Status: Abnormal  ? Collection Time: 09/24/21  5:00 PM  ? Specimen: In/Out Cath Urine  ?Result Value Ref Range Status  ? Specimen Description   Final  ?  IN/OUT CATH URINE ?Performed at [facility], [address] [place], [place] [address] ?  ? Special Requests   Final  ?  NONE ?Performed at [facility], [address], [place], [place] [address] ?  ? Culture (A)  Final  ?  50,000 COLONIES/mL METHICILLIN RESISTANT STAPHYLOCOCCUS AUREUS  ? Report Status 09/27/2021 FINAL  Final  ? Organism ID, Bacteria METHICILLIN RESISTANT STAPHYLOCOCCUS AUREUS (A)  Final  ?    Susceptibility  ? Methicillin resistant staphylococcus aureus - MIC*  ?  CIPROFLOXACIN <=0.5 SENSITIVE Sensitive   ?  GENTAMICIN <=0.5 SENSITIVE Sensitive   ?  NITROFURANTOIN 32 SENSITIVE Sensitive   ?  OXACILLIN >=4 RESISTANT Resistant   ?  TETRACYCLINE <=1 SENSITIVE Sensitive   ?  VANCOMYCIN 1 SENSITIVE Sensitive   ?  TRIMETH/SULFA <=10 SENSITIVE Sensitive   ?  CLINDAMYCIN <=0.25 SENSITIVE Sensitive   ?  RIFAMPIN <=0.5 SENSITIVE Sensitive   ?  Inducible Clindamycin NEGATIVE Sensitive   ?  * 50,000 COLONIES/mL METHICILLIN RESISTANT STAPHYLOCOCCUS AUREUS  ?CULTURE, BLOOD (ROUTINE X 2) w Reflex to ID Panel     Status: Abnormal  ? Collection Time: 09/25/21  2:54 PM  ? Specimen: Right Antecubital; Blood  ?Result Value Ref Range Status  ? Specimen Description   Final  ?  RIGHT ANTECUBITAL ?Performed at [facility], [address] [place], [place] [address] ?  ? Special Requests   Final  ?  BOTTLES DRAWN AEROBIC AND ANAEROBIC Blood Culture adequate volume ?Performed at [facility], [address] [place], [place] [address] ?  ? Culture  Setup Time   Final  ?  GRAM POSITIVE COCCI ?ANAEROBIC BOTTLE ONLY ?CRITICAL VALUE NOTED.  VALUE IS CONSISTENT WITH PREVIOUSLY REPORTED AND CALLED VALUE. ?  ?  Culture (A)  Final  ?  STAPHYLOCOCCUS AUREUS ?SUSCEPTIBILITIES PERFORMED ON PREVIOUS CULTURE WITHIN THE LAST 5 DAYS. ?Performed at [facility], [place] [address], [place], [place] [address] ?  ? Report Status 09/29/2021 FINAL  Final  ?CULTURE, BLOOD (ROUTINE X 2) w Reflex to ID Panel     Status: Abnormal  ? Collection Time: 09/25/21  2:55 PM  ? Specimen: BLOOD RIGHT HAND  ?Result Value Ref Range Status  ? Specimen Description   Final  ?  BLOOD RIGHT HAND ?Performed at [facility], [address] [place], [place] [address] ?  ? Special Requests   Final  ?  Blood Culture results may not be optimal due to an inadequate volume of blood received in culture bottles ?Performed at [facility], [address] [place], [place] [address] ?  ?  Culture  Setup Time   Final  ?  GRAM POSITIVE COCCI ?IN BOTH AEROBIC AND ANAEROBIC BOTTLES ?CRITICAL RESULT CALLED TO, READ BACK BY AND VERIFIED WITH: JASON [email or web address]  09/26/21 RH ?Performed at [facility], [address], [place], [place] [address] ?  ? Culture (A)  Final  ?  STAPHYLOCOCCUS AUREUS ?SUSCEPTIBILITIES PERFORMED ON PREVIOUS CULTURE WITHIN THE LAST 5 DAYS. ?Performed at [facility], [place] [address], [place], [place] [address] ?  ? Report Status 09/28/2021 FINAL  Final  ?Culture, blood (Routine X 2) w Reflex to ID Panel     Status: None  ? Collection Time: 09/27/21  5:43 AM  ? Specimen: BLOOD RIGHT HAND  ?Result Value Ref Range Status  ? Specimen Description BLOOD RIGHT HAND  Final  ? Special Requests   Final  ?  BOTTLES DRAWN AEROBIC AND ANAEROBIC Blood Culture adequate volume  ? Culture   Final  ?  NO GROWTH 5 DAYS ?Performed at [facility], [place] [address] [place], [place] [address] ?  ? Report Status 10/02/2021 FINAL  Final  ?Culture, blood (Routine X 2) w Reflex to ID Panel     Status: Abnormal (Preliminary result)  ? Collection Time: 09/27/21  5:43 AM  ? Specimen: BLOOD RIGHT FOREARM  ?Result Value Ref Range Status  ?  Specimen Description BLOOD RIGHT FOREARM  Final  ? Special Requests   Final  ?  BOTTLES DRAWN AEROBIC AND ANAEROBIC Blood Culture adequate volume  ? Culture  Setup Time   Final  ?  GRAM POSITIVE COCCI IN CLUSTER

## 2021-10-04 NOTE — Progress Notes (Signed)
Occupational Therapy Treatment ?Patient Details ?Name: Jill Cox ?MRN: [id number] ?DOB: 09/24/1975 ?Today's Date: 10/04/2021 ? ? ?History of present illness Pt is a 46 y.o. female admitted to [facility] 09/24/21 with c/o polyarthralgias, transfer to [facility] 09/26/21 with MRSA bacteremia in the setting of epidural abscess of the thoracic spine, multiple suspected septic joints. S/p L forearm I&D and fourth dorsal compartment extensor tenosynovectomy on 3/18. Pt with BLE pain and multiple BLE abscesses; s/p bilateral knee and R ankle aspirations, L thigh I&D, R calf I&D on 3/20. PMH includes depression, vaping. ?  ?OT comments ? Pt seen for OT session, focus on LB AE, demonstrated use of sock aid and reacher to pt for donning/doffing socks and LB clothing. Pt able to verbalize and demonstrate understanding, pt stating she is planning to order these online. Pt declining OOB mobility at this time, wanting to wait for spouse to arrive to help her wash up in bathroom. RN aware. Pt presenting with impairments listed below, will follow acutely. Continue to recommend d/c home with family assistance.  ? ?Recommendations for follow up therapy are one component of a multi-disciplinary discharge planning process, led by the attending physician.  Recommendations may be updated based on patient status, additional functional criteria and insurance authorization. ?   ?Follow Up Recommendations ? No OT follow up  ?  ?Assistance Recommended at Discharge PRN  ?Patient can return home with the following ? A little help with bathing/dressing/bathroom;Assistance with cooking/housework ?  ?Equipment Recommendations ? BSC/3in1;Other (comment) (AE)  ?  ?Recommendations for Other Services   ? ?  ?Precautions / Restrictions Precautions ?Precautions: Fall ?Restrictions ?Weight Bearing Restrictions: No ?RLE Weight Bearing: Weight bearing as tolerated ?LLE Weight Bearing: Weight bearing as tolerated  ? ? ?  ? ?Mobility Bed Mobility ?Overal bed mobility:  Modified Independent ?  ?  ?  ?  ?  ?  ?General bed mobility comments: HOB partially elevated. ?  ? ?Transfers ?  ?  ?  ?  ?  ?  ?  ?  ?  ?General transfer comment: pt declining OOB mobility at this time, wanting to wait for spouse to arrive to assist with wash up in bathroom ?  ?  ?Balance Overall balance assessment: Needs assistance ?Sitting-balance support: Single extremity supported ?Sitting balance-Leahy Scale: Good ?Sitting balance - Comments: sits EOB for ~10 min without LOB, reaches minimally outside BOS ?  ?Standing balance support: No upper extremity supported, During functional activity ?Standing balance-Leahy Scale: Fair ?  ?  ?  ?  ?  ?  ?  ?  ?  ?  ?  ?  ?   ? ?ADL either performed or assessed with clinical judgement  ? ?ADL Overall ADL's : Needs assistance/impaired ?  ?  ?  ?  ?  ?  ?  ?  ?  ?  ?Lower Body Dressing: With adaptive equipment;Supervision/safety;Sitting/lateral leans ?Lower Body Dressing Details (indicate cue type and reason): don/doff socks using sock aid and reacher with demonstration ?  ?  ?  ?  ?  ?  ?  ?  ?  ? ?Extremity/Trunk Assessment Upper Extremity Assessment ?Upper Extremity Assessment: Overall WFL for tasks assessed ?  ?Lower Extremity Assessment ?Lower Extremity Assessment: Defer to PT evaluation ?  ?  ?  ? ?Vision   ?Vision Assessment?: No apparent visual deficits ?  ?Perception Perception ?Perception: Within Functional Limits ?  ?Praxis Praxis ?Praxis: Not tested ?  ? ?Cognition Arousal/Alertness: Awake/alert ?Behavior During Therapy: [facility] for  tasks assessed/performed ?Overall Cognitive Status: Impaired/Different from baseline ?Area of Impairment: Attention ?  ?  ?  ?  ?  ?  ?  ?  ?  ?  ?  ?  ?  ?  ?  ?General Comments: reporting difficulty with time, oreinted pt to time of day ?  ?  ?   ?Exercises   ? ?  ?Shoulder Instructions   ? ? ?  ?General Comments VSS on RA  ? ? ?Pertinent Vitals/ Pain       Pain Assessment ?Pain Assessment: Faces ?Pain Score: 5  ?Faces Pain Scale:  Hurts even more ?Pain Location: BLE's ?Pain Descriptors / Indicators: Tightness, Guarding ?Pain Intervention(s): Limited activity within patient's tolerance, Monitored during session, Repositioned ? ?Home Living   ?  ?  ?  ?  ?  ?  ?  ?  ?  ?  ?  ?  ?  ?  ?  ?  ?  ?  ? ?  ?Prior Functioning/Environment    ?  ?  ?  ?   ? ?Frequency ? Min 2X/week  ? ? ? ? ?  ?Progress Toward Goals ? ?OT Goals(current goals can now be found in the care plan section) ? Progress towards OT goals: Progressing toward goals ? ?Acute Rehab OT Goals ?Patient Stated Goal: return to PLOF ?OT Goal Formulation: With patient ?Time For Goal Achievement: 10/15/21 ?Potential to Achieve Goals: Good ?ADL Goals ?Pt Will Perform Grooming: with modified independence;standing ?Pt Will Perform Lower Body Dressing: with modified independence;with adaptive equipment ?Pt Will Transfer to Toilet: with modified independence;ambulating;bedside commode ?Pt Will Perform Toileting - Clothing Manipulation and hygiene: with modified independence;sit to/from stand;sitting/lateral leans ?Additional ADL Goal #1: Pt will identify at least 2 non-pharmalogical pain and mood management strategies to help cope with losses of function, pain and recovery time in order to support quality of life at discharge.  ?Plan Discharge plan remains appropriate;Frequency remains appropriate   ? ?Co-evaluation ? ? ?   ?  ?  ?  ?  ? ?  ?AM-PAC OT "6 Clicks" Daily Activity     ?Outcome Measure ? ? Help from another person eating meals?: None ?Help from another person taking care of personal grooming?: A Little ?Help from another person toileting, which includes using toliet, bedpan, or urinal?: A Little ?Help from another person bathing (including washing, rinsing, drying)?: A Little ?Help from another person to put on and taking off regular upper body clothing?: A Little ?Help from another person to put on and taking off regular lower body clothing?: A Little ?6 Click Score: 19 ? ?  ?End of  Session   ? ?OT Visit Diagnosis: Muscle weakness (generalized) (M62.81) ?  ?Activity Tolerance Patient tolerated treatment well ?  ?Patient Left in bed;with call bell/phone within reach ?  ?Nurse Communication Mobility status;Other (comment) (reported pt awaiting for spouse to assist with bathing) ?  ? ?   ? ?Time: 1341-1406 ?OT Time Calculation (min): 25 min ? ?Charges: OT General Charges ?$OT Visit: 1 Visit ?OT Treatments ?$Self Care/Home Management : 23-37 mins ? ?Lynnda Child, OTD, OTR/L ?Acute Rehab ?[phone] ? ? ?Kaylyn Lim ?10/04/2021, 4:06 PM ?

## 2021-10-04 NOTE — Progress Notes (Signed)
Peripherally Inserted Central Catheter Placement ? ?The IV Nurse has discussed with the patient and/or persons authorized to consent for the patient, the purpose of this procedure and the potential benefits and risks involved with this procedure.  The benefits include less needle sticks, lab draws from the catheter, and the patient may be discharged home with the catheter. Risks include, but not limited to, infection, bleeding, blood clot (thrombus formation), and puncture of an artery; nerve damage and irregular heartbeat and possibility to perform a PICC exchange if needed/ordered by physician.  Alternatives to this procedure were also discussed.  Bard Power PICC patient education guide, fact sheet on infection prevention and patient information card has been provided to patient /or left at bedside.   ? ?PICC Placement Documentation  ?PICC Single Lumen [id number] Right Basilic 41 cm 1 cm (Active)  ?Indication for Insertion or Continuance of Line Home intravenous therapies (PICC only) 10/04/21 2120  ?Exposed Catheter (cm) 1 cm 10/04/21 2120  ?Site Assessment Clean, Dry, Intact 10/04/21 2120  ?Line Status Flushed;Saline locked;Blood return noted 10/04/21 2120  ?Dressing Type Securing device;Transparent 10/04/21 2120  ?Dressing Status Antimicrobial disc in place;Clean, Dry, Intact 10/04/21 2120  ?Dressing Intervention New dressing 10/04/21 2120  ?Dressing Change Due 10/11/21 10/04/21 2120  ? ? ? ? ? ?Shon Hale ?10/04/2021, 9:41 PM ? ?

## 2021-10-04 NOTE — Progress Notes (Signed)
NEUROSURGERY PROGRESS NOTE ? ? ?Doing ok. Still having a lot of pain in her lower extremities with edema. MAE well. Continue IV abx per ID. No new nsgy recom right now.   ? ?Temp:  [97.6 ?F (36.4 ?C)-98.3 ?F (36.8 ?C)] 98.3 ?F (36.8 ?C) (03/22 0544) ?Pulse Rate:  [69-90] 90 (03/22 0544) ?Resp:  [15-17] 15 (03/22 0544) ?BP: (101-131)/(45-61) 131/61 (03/22 0544) ?SpO2:  [96 %-100 %] 96 % (03/22 0544) ? ? ?Jill Chiquito, NP ?10/04/2021 ?7:54 AM ?  ?

## 2021-10-04 NOTE — Progress Notes (Signed)
Orthopedic Tech Progress Note ?Patient Details:  ?Thandiwe Samaras ?1975/10/27 ?[id number] ? ?Called in order to [employer] for BUE VIVE COMPRESSION SOCKS  ? ?Patient ID: Latoi Draffen, female   DOB: 03-Feb-1976, 46 y.o.   MRN: [id number] ? ?Janit Pagan ?10/04/2021, 10:41 AM ? ?

## 2021-10-04 NOTE — Progress Notes (Signed)
Physical Therapy Treatment ?Patient Details ?Name: Jill Cox ?MRN: [id number] ?DOB: [birth year] ?Today's Date: 10/04/2021 ? ? ?History of Present Illness Pt is a 46 y.o. female admitted to [facility] 09/24/21 with c/o polyarthralgias, transfer to [facility] 09/26/21 with MRSA bacteremia in the setting of epidural abscess of the thoracic spine, multiple suspected septic joints. S/p L forearm I&D and fourth dorsal compartment extensor tenosynovectomy on 3/18. Pt with BLE pain and multiple BLE abscesses; s/p bilateral knee and R ankle aspirations, L thigh I&D, R calf I&D on 3/20. PMH includes depression, vaping. ?  ?PT Comments  ? ? Pt declining OOB mobility today, endorses increased pain, fatigue, and "not having a good day today." Agreeable to therex educ, HEP provided; pt lacking significant bilateral knee flexion and R ankle DF with supine AROM/stretches. Pt reports, "I'll be ready to do whatever we need to do tomorrow." Will continue to follow acutely to address established goals. ?   ?Recommendations for follow up therapy are one component of a multi-disciplinary discharge planning process, led by the attending physician.  Recommendations may be updated based on patient status, additional functional criteria and insurance authorization. ? ?Follow Up Recommendations ? Outpatient PT ?  ?  ?Assistance Recommended at Discharge PRN  ?Patient can return home with the following A little help with bathing/dressing/bathroom;Assistance with cooking/housework;Assist for transportation;Help with stairs or ramp for entrance ?  ?Equipment Recommendations ?  (TBD)   ?  ?Recommendations for Other Services   ? ? ?  ?Precautions / Restrictions Precautions ?Precautions: Fall ?Restrictions ?Weight Bearing Restrictions: No ?RLE Weight Bearing: Weight bearing as tolerated ?LLE Weight Bearing: Weight bearing as tolerated  ?  ? ?Mobility ? Bed Mobility ?Overal bed mobility: Modified Independent ?  ?  ?  ?  ?  ?  ?General bed mobility comments: HOB  partially elevated. ?  ? ?Transfers ?  ?  ?  ?  ?  ?  ?  ?  ?  ?General transfer comment: pt declines OOB ?  ? ?Ambulation/Gait ?  ?  ?  ?  ?  ?  ?  ?  ? ? ?Stairs ?  ?  ?  ?  ?  ? ? ?Wheelchair Mobility ?  ? ?Modified Rankin (Stroke Patients Only) ?  ? ? ?  ?Balance   ?  ?  ?  ?  ?  ?  ?  ?  ?  ?  ?  ?  ?  ?  ?  ?  ?  ?  ?  ? ?  ?Cognition Arousal/Alertness: Awake/alert ?Behavior During Therapy: Flat affect ?Overall Cognitive Status: Impaired/Different from baseline ?Area of Impairment: Attention, Problem solving ?  ?  ?  ?  ?  ?  ?  ?  ?  ?Current Attention Level: Selective ?  ?  ?  ?  ?Problem Solving: Slow processing ?General Comments: pt with increased flat affect, reports "not having a good day" but not forthcoming with further info; slow to respond in conversation at times. states, "I'll be ready to do whatever we need to do tomorrow" ?  ?  ? ?  ?Exercises Other Exercises ?Other Exercises: [employer] HEP handout (Access Code [id number]) provided - SLR, seated adductor squeeze, standing calf stretch, partial squats, standing hip abd - pt agreeable to educ and demonstration, but declines to perform HEP; educ pt majority of exercises can also be done bed-level, still declines ?Other Exercises: gait belt strap provided - pt able to perform supine R calf stretch with  strap, partial bilateral heel slides with strap ? ?  ?General Comments General comments (skin integrity, edema, etc.): VSS on RA ?  ?  ? ?Pertinent Vitals/Pain Pain Assessment ?Pain Assessment: Faces ?Faces Pain Scale: Hurts even more ?Pain Location: BLEs, especially R ankle ?Pain Descriptors / Indicators: Tightness, Guarding, Sore ?Pain Intervention(s): Monitored during session, Limited activity within patient's tolerance  ? ? ?Home Living   ?  ?  ?  ?  ?  ?  ?  ?  ?  ?   ?  ?Prior Function    ?  ?  ?   ? ?PT Goals (current goals can now be found in the care plan section) Progress towards PT goals: Progressing toward goals (slowly) ? ?  ?Frequency ? ? ?  Min 5X/week ? ? ? ?  ?PT Plan Current plan remains appropriate  ? ? ?Co-evaluation   ?  ?  ?  ?  ? ?  ?AM-PAC PT "6 Clicks" Mobility   ?Outcome Measure ? Help needed turning from your back to your side while in a flat bed without using bedrails?: None ?Help needed moving from lying on your back to sitting on the side of a flat bed without using bedrails?: None ?Help needed moving to and from a bed to a chair (including a wheelchair)?: None ?Help needed standing up from a chair using your arms (e.g., wheelchair or bedside chair)?: None ?Help needed to walk in hospital room?: A Little ?Help needed climbing 3-5 steps with a railing? : A Little ?6 Click Score: 22 ? ?  ?End of Session   ?Activity Tolerance: Patient limited by fatigue;Patient limited by pain ?Patient left: in bed;with call bell/phone within reach ?Nurse Communication: Mobility status ?PT Visit Diagnosis: Other abnormalities of gait and mobility (R26.89);Muscle weakness (generalized) (M62.81);Pain ?  ? ? ?Time: [id number] ?PT Time Calculation (min) (ACUTE ONLY): 15 min ? ?Charges:  $Therapeutic Exercise: 8-22 mins          ?          ? ?Mabeline Caras, PT, DPT ?Acute Rehabilitation Services  ?Pager [phone] ?Office [phone] ? ?Derry Lory ?10/04/2021, 5:20 PM ? ?

## 2021-10-05 DIAGNOSIS — B9562 Methicillin resistant Staphylococcus aureus infection as the cause of diseases classified elsewhere: Secondary | ICD-10-CM | POA: Diagnosis not present

## 2021-10-05 DIAGNOSIS — R7881 Bacteremia: Secondary | ICD-10-CM | POA: Diagnosis not present

## 2021-10-05 DIAGNOSIS — R5381 Other malaise: Secondary | ICD-10-CM

## 2021-10-05 LAB — AEROBIC/ANAEROBIC CULTURE W GRAM STAIN (SURGICAL/DEEP WOUND): Gram Stain: NONE SEEN

## 2021-10-05 MED ORDER — FLEET ENEMA 7-19 GM/118ML RE ENEM
1.0000 | ENEMA | Freq: Once | RECTAL | Status: AC
  Administered 2021-10-05: 1 via RECTAL
  Filled 2021-10-05: qty 1

## 2021-10-05 NOTE — Progress Notes (Signed)
?PROGRESS NOTE ? ?Jill Cox  [id number] DOB: 09-17-1975 DOA: 09/26/2021 ?PCP: Cleon Dew, FNP  ? ?Brief Narrative: ?Jill Cox is a 46 y.o. female with medical history significant for depression who is admitted to [facility] on 09/26/2021 by way of transfer from [facility] with MRSA bacteremia in the setting of epidural abscess of the thoracic spine, multiple suspected septic joints, after presenting from home to [facility] ED on 09/24/2021 complaining of polyarthralgias.  ?Patient began experiencing polyarthralgias with distribution that included bilateral wrist, bilateral knees, and right ankle.  Her PCP initiated 3-day course of prednisone, without any ensuing improvement in the symptoms.  Given the ongoing nature of these polyarthralgias, the patient then presented to [facility] ED on 09/23/2021 at which time she was started on Keflex and d/ced, presumably for associated cellulitis.  Blood cultures drawn at the time of the ED visit ultimately grew MRSA, and the patient was contacted the subsequent day and instructed to return back to [facility] ED for further evaluation and management, leading to her formal admission on 09/24/2021 for MRSA bacteremia. She was started on IV vancomycin, with repeat blood cultures also reportedly positive for MRSA. She underwent MRI of the bilateral knees demonstrating moderate to large effusions concerning for septic arthritis.  She also underwent right ankle MRI, which demonstrated evidence of effusion, potentially septic in nature.  Additionally, MRIs of the bilateral wrists showed evidence of tenosynovitis without evidence of overt septic arthritis.  Subsequently, MRI of the thoracic spine showed evidence of epidural abscess at T2/T3.  Cardiology was consulted, and ultimately the patient underwent TTE as well as TEE which showed no evidence of bacterial endocarditis. Subsequently, the patient was transferred to [facility] for further evaluation and management of her MRSA bacteremia,  epidural abscess of the thoracic spine, and multiple suspected septic joints requiring extensive washout procedure by orthopedic surgery, as above. ?ID, orthopedic surgery, neurosurgery following. ?   ?Significant events: ?3/18 left forearm I&D with fourth dorsal compartment extensor tenosynovectomy ?3/20 I&D right gastrocnemius abscess, left thigh abscess - aspiration bilateral knee and right ankle joints ? ? ?Assessment & Plan: ? ?Principal Problem: ?  MRSA bacteremia ?Active Problems: ?  Septic joint (HCC) ?  Epidural abscess ?  Depression ?  Hypoalbuminemia: Severe ?  Hypokalemia ?  Constipation ?  Anemia ?  Edema due to hypervolemia ?  Debility ? ? ?Assessment and Plan: ?* MRSA bacteremia ? - Multiple potential sources for MRSA bacteremia, including epidural abscess associated with T2/T3, with multiple potential septic joints including the bilateral knees. ?-TEE demonstrated no evidence of bacterial endocarditis ?-ID following and due to ongoing positive blood cultures, ongoing fevers IV antibiotics have been changed from IV vancomycin to IV daptomycin and IV teflaro.  Plan to continue on Dapto/ceftaroline until 3/30 then suspected daptomycin.S/P PICC line placement  ? ? ?Septic joint (HCC) ?-Moderate to large effusions identified on bilateral knees per MRIs performed at [facility], concerning for septic arthritis in bilateral distribution, in the setting of presenting MRSA bacteremia. ?-CT 3/17 suggested abcess of L wrist/forearm .MRI R LE 3/19 suggests possible abscesses and probable septic arthritis of R knee - MRI L LE 3/19 confirmed new abscess of L thigh . ?-S/P  left forearm I&D with fourth dorsal compartment extensor tenosynovectomy,I&D right gastrocnemius abscess, left thigh abscess - aspiration bilateral knee and right ankle joints ?No further plan for operative intervention as per orthopedics ? ?Epidural abscess ?MRI of the thoracic spine performed during [facility] hospital course suggestive of epidural abscess  at  T2/T3, without associated acute focal neurologic deficits.  Neurosurgery thinks that there is no evidence of cord compression . ?-MRI of the C-spine with dorsal epidural abscess throughout the cervical spine contributing to moderate spinal stenosis.  No cord signal abnormality. ?-Per neurosurgery no surgical intervention at this time and recommending continuation of IV antibiotics. ? ?  ? ?Depression ?-Zoloft. ?  ? ?Debility ?PT recommending outpatient follow-up ? ?Edema due to hypervolemia ?- Patient with anasarca ?-2D echo/TEE done during this admission with normal EF,NWMA. ?-Unclear etiology but hypoalbuminemia might have contributed ?-On IV lasix,will continue for now ? ?Anemia ?-Patient with no overt bleeding ?-Anemia panel showed low iron of 17,given IV iron ?-Follow H&H. ?-Hb stable ? ?Constipation ?-On MiraLAX daily, Senokot-S twice daily. ? ?Hypokalemia ?- Monitor and supplement as needed ? ?Hypoalbuminemia: Severe ?- Patient noted on lab work to have a albumin level of < 1.5 from 2.7 on 09/23/2021. ?-unclear etiology ?-Patient was given IV albumin ?-Placed on Ensure supplementation. ?-Diet has been liberalized to a regular diet. ? ? ? ? ? ? ? ?  ?  ? ?DVT prophylaxis:SCD's Start: 09/30/21 1833 ?enoxaparin (LOVENOX) injection 40 mg Start: 09/29/21 1300 ? ? ?  Code Status: Full Code ? ?Family Communication:  ? ?Patient status: Inpatient ? ?Patient is from : Home ? ?Anticipated discharge to: Home ? ?Estimated DC date: In next 2 to 3 days, needs ID and orthopedics clearance for discharge ? ?Consultants: Orthopedics, neurosurgery, ID ? ?Procedures: None ? ?Antimicrobials:  ?Anti-infectives (From admission, onward)  ? ? Start     Dose/Rate Route Frequency Ordered Stop  ? 10/02/21 1310  vancomycin (VANCOCIN) powder  Status:  Discontinued       ?   As needed 10/02/21 1310 10/02/21 1508  ? 09/30/21 1030  tobramycin (NEBCIN) injection 40 mg       ? 40 mg Intramuscular  Once 09/30/21 0945    ? 09/28/21 1030   DAPTOmycin (CUBICIN) 650 mg in sodium chloride 0.9 % IVPB       ? 8 mg/kg ? 81.7 kg ?126 mL/hr over 30 Minutes Intravenous Daily 09/28/21 0932    ? 09/28/21 1030  ceftaroline (TEFLARO) 600 mg in sodium chloride 0.9 % 100 mL IVPB       ? 600 mg ?100 mL/hr over 60 Minutes Intravenous Every 8 hours 09/28/21 0932 10/12/21 2359  ? 09/27/21 0215  vancomycin (VANCOCIN) IVPB 1000 mg/200 mL premix  Status:  Discontinued       ? 1,000 mg ?200 mL/hr over 60 Minutes Intravenous 2 times daily 09/27/21 0115 09/28/21 0932  ? ?  ? ? ?Subjective: ?Patient seen and examined at the bedside this morning.  Hemodynamically stable.  Struggling with constipation.  Denies any new complaints ? ?Objective: ?Vitals:  ? 10/04/21 1332 10/04/21 2017 10/05/21 0005 10/05/21 0756  ?BP: 106/60 115/61 (!) 109/48 (!) 120/57  ?Pulse: 80 85 77 88  ?Resp: 17 20 18 17   ?Temp: 99.2 ?F (37.3 ?C) 99 ?F (37.2 ?C) 98.2 ?F (36.8 ?C) 98.4 ?F (36.9 ?C)  ?TempSrc: Oral Oral Oral Oral  ?SpO2: 98% 96% 96% 97%  ?Weight:      ?Height:      ? ?No intake or output data in the 24 hours ending 10/05/21 1033 ? ?Filed Weights  ? 09/26/21 2336 09/30/21 0500 09/30/21 1548  ?Weight: 81.7 kg 82 kg 82 kg  ? ? ?Examination: ? ? ?General exam: Overall comfortable, not in distress ?HEENT: PERRL ?Respiratory system:  no wheezes or crackles  ?  Cardiovascular system: S1 & S2 heard, RRR.  ?Gastrointestinal system: Abdomen is nondistended, soft and nontender. ?Central nervous system: Alert and oriented ?Extremities: Anasarca, surgical wounds on left upper, bilateral lower extremities skin: No rashes, no ulcers,no icterus   ? ? ?Data Reviewed: I have personally reviewed following labs and imaging studies ? ?CBC: ?Recent Labs  ?Lab 09/29/21 ?0626 09/30/21 ?1449 10/02/21 ?[id number] 10/03/21 ?[id number] 10/04/21 ?0701  ?WBC 14.5* 20.4* 15.0* 13.3* 13.3*  ?NEUTROABS 11.9* 16.8*  --   --   --   ?HGB 8.2* 9.8* 9.1* 9.1* 8.9*  ?HCT 24.5* 29.8* 28.0* 27.8* 28.3*  ?MCV 92.5 93.1 94.6 94.6 97.3  ?PLT 218 419*  534* 688* 795*  ? ?Basic Metabolic Panel: ?Recent Labs  ?Lab 09/29/21 ?0626 09/30/21 ?1449 10/01/21 ?[id number] 10/02/21 ?[id number] 10/03/21 ?[id number] 10/04/21 ?0701  ?NA 136 137 137 138 138 138  ?K 3.3* 3.5 3.9 3.2* 3.7

## 2021-10-05 NOTE — Progress Notes (Addendum)
PHARMACY CONSULT NOTE FOR: ? ?OUTPATIENT  PARENTERAL ANTIBIOTIC THERAPY (OPAT) ? ?Indication: Severe MRSA sepsis ?Regimen: daptomycin 650 mg IV q24hr (8 mg/kg * 81.7 kg) ?End date: 11/13/21, 6 weeks from surgery ? ?IV antibiotic discharge orders are pended. ?To discharging provider:  please sign these orders via discharge navigator,  ?Select New Orders & click on the button choice - Manage This Unsigned Work.  ?  ? ?Thank you for allowing pharmacy to be a part of this patient's care. ? ?Jerilynn Birkenhead ?10/05/2021, 10:34 AM ? ?

## 2021-10-05 NOTE — TOC Progression Note (Addendum)
Transition of Care (TOC) - Progression Note  ? ? ?Patient Details  ?Name: Jill Cox ?MRN: [id number] ?Date of Birth: 1975/10/25 ? ?Transition of Care (TOC) CM/SW Contact  ?Sharin Mons, RN ?Phone Number: ?10/05/2021, 11:58 AM ? ?Clinical Narrative:    ?Pt with MRSA bacteremia, will probably need LT IV ABX therapy. NCM spoke with pt concerning potential need for home infusion therapy. Pt agreeable to home health services. Pt without preference with [place] agency. Referral made with [facility] for IV ABX and accepted.  ?NCM aware of HHRN need, process in place, will updated once secured. ? ?PICC LINE placed 3/23.  ? ?OPAT in place. ? ?TOC team following and will assist with needs. ? ? ?Expected Discharge Plan: [place] ?Barriers to Discharge: Continued Medical Work up ? ?Expected Discharge Plan and Services ?Expected Discharge Plan: [place] ?  ?  ?  ?  ?                ?DME Arranged: Other see comment (IV ABX THERAPY) ?  ?Date DME Agency Contacted: 10/05/21 ?Time DME Agency Contacted: [phone] ?Representative spoke with at DME Agency: [place]: RN ?  ?  ?  ?  ? ? ?Social Determinants of Health (SDOH) Interventions ?  ? ?Readmission Risk Interventions ?   ? View : No data to display.  ?  ?  ?  ? ? ?

## 2021-10-05 NOTE — Plan of Care (Signed)

## 2021-10-05 NOTE — Progress Notes (Signed)
Physical Therapy Treatment ?Patient Details ?Name: Jill Cox ?MRN: [id number] ?DOB: Nov 27, 1975 ?Today's Date: 10/05/2021 ? ? ?History of Present Illness Pt is a 46 y.o. female admitted to [facility] 09/24/21 with c/o polyarthralgias, transfer to [facility] 09/26/21 with MRSA bacteremia in the setting of epidural abscess of the thoracic spine, multiple suspected septic joints. S/p L forearm I&D and fourth dorsal compartment extensor tenosynovectomy on 3/18. Pt with BLE pain and multiple BLE abscesses; s/p bilateral knee and R ankle aspirations, L thigh I&D, R calf I&D on 3/20. PMH includes depression, vaping. ?  ?PT Comments  ? ? Pt slowly progressing with mobility, noted improvements in R ankle mobility. Pt c/o dizziness with ambulation today (see BP values below; RN notified). Pt hopeful for d/c home soon; reviewed educ with pt and husband who report no further questions or concerns. If pt to remain admitted, will continue to follow acutely. ? ?Post-ambulation seated BP 96/59 ?Standing BP 82/47 ?Standing ~1-min then sitting down BP 89/69 ? ?   ?Recommendations for follow up therapy are one component of a multi-disciplinary discharge planning process, led by the attending physician.  Recommendations may be updated based on patient status, additional functional criteria and insurance authorization. ? ?Follow Up Recommendations ? Outpatient PT ?  ?  ?Assistance Recommended at Discharge PRN  ?Patient can return home with the following A little help with bathing/dressing/bathroom;Assistance with cooking/housework;Assist for transportation;Help with stairs or ramp for entrance ?  ?Equipment Recommendations ? None recommended by PT (pt declined)  ?  ?Recommendations for Other Services   ? ? ?  ?Precautions / Restrictions Precautions ?Precautions: Fall;Other (comment) ?Precaution Comments: symptomatic hypotension ?Restrictions ?Weight Bearing Restrictions: Yes ?RLE Weight Bearing: Weight bearing as tolerated ?LLE Weight Bearing: Weight  bearing as tolerated  ?  ? ?Mobility ? Bed Mobility ?  ?  ?  ?  ?  ?  ?  ?General bed mobility comments: remained seated EOB ?  ? ?Transfers ?Overall transfer level: Independent ?Equipment used: None ?  ?  ?  ?  ?  ?  ?  ?General transfer comment: standing from bed and low toilet height without DME, increased time ?  ? ?Ambulation/Gait ?Ambulation/Gait assistance: Supervision ?Gait Distance (Feet): 20 Feet ?Assistive device: None ?Gait Pattern/deviations: Step-through pattern, Decreased stride length, Decreased dorsiflexion - right, Decreased weight shift to right, Trunk flexed, Antalgic, Knee flexed in stance - right ?Gait velocity: Decreased ?  ?  ?General Gait Details: Slow, antalgic gait without UE support; supervision for balance and c/o dizziness; noted R heel reaching floor, still with antalgic gait; pt declines additional distance ? ? ?Stairs ?  ?  ?  ?  ?  ? ? ?Wheelchair Mobility ?  ? ?Modified Rankin (Stroke Patients Only) ?  ? ? ?  ?Balance Overall balance assessment: Needs assistance ?Sitting-balance support: No upper extremity supported ?Sitting balance-Leahy Scale: Good ?  ?  ?Standing balance support: No upper extremity supported, During functional activity ?Standing balance-Leahy Scale: Fair ?  ?  ?  ?  ?  ?  ?  ?  ?  ?  ?  ?  ?  ? ?  ?Cognition Arousal/Alertness: Awake/alert ?Behavior During Therapy: Flat affect ?Overall Cognitive Status: Impaired/Different from baseline ?Area of Impairment: Attention, Problem solving ?  ?  ?  ?  ?  ?  ?  ?  ?  ?Current Attention Level: Selective ?  ?  ?  ?  ?Problem Solving: Slow processing ?  ?  ?  ? ?  ?  Exercises   ? ?  ?General Comments General comments (skin integrity, edema, etc.): pt's husband present and supportive. reviewed educ re: precautions, positioning, activity recommendations, therex (pt declined going through HEP), fall risk reduction. noted hypotension (down to 82/47 standing), RN notified ?  ?  ? ?Pertinent Vitals/Pain Pain Assessment ?Pain  Assessment: Faces ?Faces Pain Scale: Hurts little more ?Pain Location: BLEs, LUE ?Pain Descriptors / Indicators: Tightness, Guarding, Sore ?Pain Intervention(s): Monitored during session, Limited activity within patient's tolerance  ? ? ?Home Living   ?  ?  ?  ?  ?  ?  ?  ?  ?  ?   ?  ?Prior Function    ?  ?  ?   ? ?PT Goals (current goals can now be found in the care plan section) Progress towards PT goals: Progressing toward goals (slowly; self-limiting) ? ?  ?Frequency ? ? ? Min 5X/week ? ? ? ?  ?PT Plan Current plan remains appropriate  ? ? ?Co-evaluation   ?  ?  ?  ?  ? ?  ?AM-PAC PT "6 Clicks" Mobility   ?Outcome Measure ? Help needed turning from your back to your side while in a flat bed without using bedrails?: None ?Help needed moving from lying on your back to sitting on the side of a flat bed without using bedrails?: None ?Help needed moving to and from a bed to a chair (including a wheelchair)?: None ?Help needed standing up from a chair using your arms (e.g., wheelchair or bedside chair)?: None ?Help needed to walk in hospital room?: A Little ?Help needed climbing 3-5 steps with a railing? : A Little ?6 Click Score: 22 ? ?  ?End of Session   ?Activity Tolerance: Patient limited by fatigue;Patient limited by pain ?Patient left: in bed;with call bell/phone within reach;with family/visitor present ?Nurse Communication: Mobility status ?PT Visit Diagnosis: Other abnormalities of gait and mobility (R26.89);Muscle weakness (generalized) (M62.81);Pain ?  ? ? ?Time: [id number] ?PT Time Calculation (min) (ACUTE ONLY): 16 min ? ?Charges:  $Self Care/Home Management: 8-22          ?          ? ?Mabeline Caras, PT, DPT ?Acute Rehabilitation Services  ?Pager [phone] ?Office [phone] ? ?Derry Lory ?10/05/2021, 5:30 PM ? ?

## 2021-10-05 NOTE — Assessment & Plan Note (Signed)
PT recommending outpatient follow-up ?

## 2021-10-05 NOTE — Progress Notes (Signed)
?   ? ? ? ? ?[facility] ? ?Date of Admission:  09/26/2021    ? ? ?Lines: ?Peripheral iv's ? ?Abx: ?3/16-c daptomycin/ceftaroline ? ?3/12-3/15 vancomycin ? ? ?ASSESSMENT: ?46 yo female admitted in transfer from [place] on 3/14 for severe metastatic mrsa sepsis ? ?No obvious risk factor for mrsa bsi ? ?She developed sx of polyarthralgia/bodyache and malaise 1-2 weeks prior to admission ? ?Initial evaluation at [facility] positive for bcx 3/11 (persistent daily until 3/13), all mri (thoracic/cervical spine), knees, wrists, femurs, right ankle are positive for soft tissue pyogenic focus and the spine mri series suggest posterior epidural abscess from cervical to upper thoracic area. L-spine mri shows potential C2 involvement as well  ? ?------ ?3/23 assessment ?3/16 and 3/19 bcx ngtd, since dapto/ceftaroline started ?3/18 s/p left wrist I&D --> cx staph aureus ?3/20 s/p I&D right calf and left thigh; cx staph aureus ?3/20 left knee and right ankle/knee aspirate as well- negative gram stain and culture -- clinically doesn't appear involved ? ?Overall much improved. Appear all sources controlled at this time ?Suspect we can deescalate to monotherapy earlier ? ? ?PLAN: ?Continue ceftaroline/dapto for now; on 3/27 can transition to daptomycin monotherapy until 5/01, then plan to finish total 8 weeks abx with doxycycline ?Discussed with primary team ? ? ? ? ?Principal Problem: ?  MRSA bacteremia ?Active Problems: ?  Depression ?  Septic joint ([place]) ?  Epidural abscess ?  Hypoalbuminemia: Severe ?  Hypokalemia ?  Constipation ?  Anemia ?  Edema due to hypervolemia ?  Debility ? ? ?No Known Allergies ? ?Scheduled Meds: ? Chlorhexidine Gluconate Cloth  6 each Topical Daily  ? enoxaparin (LOVENOX) injection  40 mg Subcutaneous Q24H  ? feeding supplement  237 mL Oral BID BM  ? ferrous sulfate  325 mg Oral Q breakfast  ? furosemide  40 mg Intravenous Q12H  ? polyethylene glycol  17 g Oral Daily  ? senna-docusate   1 tablet Oral BID  ? sertraline  25 mg Oral q morning  ? sodium chloride flush  10-40 mL Intracatheter Q12H  ? tobramycin  40 mg Intramuscular Once  ? ?Continuous Infusions: ? ceFTAROline (TEFLARO) IV 600 mg (10/05/21 1112)  ? DAPTOmycin (CUBICIN)  IV 650 mg (10/04/21 2002)  ? ?PRN Meds:.acetaminophen **OR** [DISCONTINUED] acetaminophen, cyclobenzaprine, HYDROcodone-acetaminophen, HYDROmorphone (DILAUDID) injection, naLOXone (NARCAN)  injection, ondansetron (ZOFRAN) IV, sodium chloride flush ? ? ?SUBJECTIVE: ?Doing well ?No n/v/diarrhea ?Some abd pain ?No fever/chill ?Stable mild leukocytosis ? ? ?Review of Systems: ?ROS ?All other ROS was negative, except mentioned above ? ? ? ? ?OBJECTIVE: ?Vitals:  ? 10/04/21 2017 10/05/21 0005 10/05/21 0756 10/05/21 1359  ?BP: 115/61 (!) 109/48 (!) 120/57 123/61  ?Pulse: 85 77 88 73  ?Resp: 20 18 17 17   ?Temp: 99 ?F (37.2 ?C) 98.2 ?F (36.8 ?C) 98.4 ?F (36.9 ?C) 98.5 ?F (36.9 ?C)  ?TempSrc: Oral Oral Oral Oral  ?SpO2: 96% 96% 97% 95%  ?Weight:      ?Height:      ? ?Body mass index is 29.19 kg/m?. ? ?Physical Exam ?General/constitutional: no distress, pleasant ?HEENT: Normocephalic, PER, Conj Clear, EOMI, Oropharynx clear ?Neck supple ?CV: rrr no mrg ?Lungs: clear to auscultation, normal respiratory effort ?Abd: Soft, Nontender ?Neuro: nonfocal ? ? ?SKIN/EXT/MSK: left wrist incision suture intact no fluctuance/erythema/swelling; no cellulitic change left thigh posteriorly near incision or right calf near incision; much improved edema bilateral LE ? ? ?Central line presence: rue picc site clean no  erythema ? ? ? ? ?Lab Results ?Lab Results  ?Component Value Date  ? WBC 13.3 (H) 10/04/2021  ? HGB 8.9 (L) 10/04/2021  ? HCT 28.3 (L) 10/04/2021  ? MCV 97.3 10/04/2021  ? PLT 795 (H) 10/04/2021  ?  ?Lab Results  ?Component Value Date  ? CREATININE 0.82 10/04/2021  ? BUN 15 10/04/2021  ? NA 138 10/04/2021  ? K 3.5 10/04/2021  ? CL 100 10/04/2021  ? CO2 28 10/04/2021  ?  ?Lab Results   ?Component Value Date  ? ALT 26 10/01/2021  ? AST 25 10/01/2021  ? ALKPHOS 68 10/01/2021  ? BILITOT 0.8 10/01/2021  ?  ? ? ?Microbiology: ?Recent Results (from the past 240 hour(s))  ?Culture, blood (Routine X 2) w Reflex to ID Panel     Status: None  ? Collection Time: 09/27/21  5:43 AM  ? Specimen: BLOOD RIGHT HAND  ?Result Value Ref Range Status  ? Specimen Description BLOOD RIGHT HAND  Final  ? Special Requests   Final  ?  BOTTLES DRAWN AEROBIC AND ANAEROBIC Blood Culture adequate volume  ? Culture   Final  ?  NO GROWTH 5 DAYS ?Performed at [facility], [place] [address], [place], [place] [address] ?  ? Report Status 10/02/2021 FINAL  Final  ?Culture, blood (Routine X 2) w Reflex to ID Panel     Status: Abnormal (Preliminary result)  ? Collection Time: 09/27/21  5:43 AM  ? Specimen: BLOOD RIGHT FOREARM  ?Result Value Ref Range Status  ? Specimen Description BLOOD RIGHT FOREARM  Final  ? Special Requests   Final  ?  BOTTLES DRAWN AEROBIC AND ANAEROBIC Blood Culture adequate volume  ? Culture  Setup Time   Final  ?  GRAM POSITIVE COCCI IN CLUSTERS ?ANAEROBIC BOTTLE ONLY ?CRITICAL RESULT CALLED TO, READ BACK BY AND VERIFIED WITH: PHARMD VPamalee Leyden [id number] @2248  FH  ?  ? Culture (A)  Final  ?  METHICILLIN RESISTANT STAPHYLOCOCCUS AUREUS ?Sent to [facility] for further susceptibility testing. ?Performed at [facility], [place] [address], [place], [place] [address] ?  ? Report Status PENDING  Incomplete  ? Organism ID, Bacteria METHICILLIN RESISTANT STAPHYLOCOCCUS AUREUS  Final  ?    Susceptibility  ? Methicillin resistant staphylococcus aureus - MIC*  ?  CIPROFLOXACIN <=0.5 SENSITIVE Sensitive   ?  ERYTHROMYCIN <=0.25 SENSITIVE Sensitive   ?  GENTAMICIN <=0.5 SENSITIVE Sensitive   ?  OXACILLIN >=4 RESISTANT Resistant   ?  TETRACYCLINE <=1 SENSITIVE Sensitive   ?  VANCOMYCIN <=0.5 SENSITIVE Sensitive   ?  TRIMETH/SULFA <=10 SENSITIVE Sensitive   ?  CLINDAMYCIN <=0.25 SENSITIVE Sensitive   ?  RIFAMPIN <=0.5 SENSITIVE  Sensitive   ?  Inducible Clindamycin NEGATIVE Sensitive   ?  * METHICILLIN RESISTANT STAPHYLOCOCCUS AUREUS  ?Blood Culture ID Panel (Reflexed)     Status: Abnormal  ? Collection Time: 09/27/21  5:43 AM  ?Result Value Ref Range Status  ? Enterococcus faecalis NOT DETECTED NOT DETECTED Final  ? Enterococcus Faecium NOT DETECTED NOT DETECTED Final  ? Listeria monocytogenes NOT DETECTED NOT DETECTED Final  ? Staphylococcus species DETECTED (A) NOT DETECTED Final  ?  Comment: CRITICAL RESULT CALLED TO, READ BACK BY AND VERIFIED WITH: ?PHARMD VPamalee Leyden [id number] @2248  FH   ?  ? Staphylococcus aureus (BCID) DETECTED (A) NOT DETECTED Final  ?  Comment: Methicillin (oxacillin)-resistant Staphylococcus aureus (MRSA). MRSA is predictably resistant to beta-lactam antibiotics (except ceftaroline). Preferred therapy is  vancomycin unless clinically contraindicated. Patient requires contact precautions if  ?hospitalized. ?CRITICAL RESULT CALLED TO, READ BACK BY AND VERIFIED WITH: ?PHARMD VPamalee Leyden [id number] @2248  FH   ?  ? Staphylococcus epidermidis NOT DETECTED NOT DETECTED Final  ? Staphylococcus lugdunensis NOT DETECTED NOT DETECTED Final  ? Streptococcus species NOT DETECTED NOT DETECTED Final  ? Streptococcus agalactiae NOT DETECTED NOT DETECTED Final  ? Streptococcus pneumoniae NOT DETECTED NOT DETECTED Final  ? Streptococcus pyogenes NOT DETECTED NOT DETECTED Final  ? A.calcoaceticus-baumannii NOT DETECTED NOT DETECTED Final  ? Bacteroides fragilis NOT DETECTED NOT DETECTED Final  ? Enterobacterales NOT DETECTED NOT DETECTED Final  ? Enterobacter cloacae complex NOT DETECTED NOT DETECTED Final  ? Escherichia coli NOT DETECTED NOT DETECTED Final  ? Klebsiella aerogenes NOT DETECTED NOT DETECTED Final  ? Klebsiella oxytoca NOT DETECTED NOT DETECTED Final  ? Klebsiella pneumoniae NOT DETECTED NOT DETECTED Final  ? Proteus species NOT DETECTED NOT DETECTED Final  ? Salmonella species NOT DETECTED NOT DETECTED Final  ? Serratia marcescens  NOT DETECTED NOT DETECTED Final  ? Haemophilus influenzae NOT DETECTED NOT DETECTED Final  ? Neisseria meningitidis NOT DETECTED NOT DETECTED Final  ? Pseudomonas aeruginosa NOT DETECTED NOT DETECTED Fi

## 2021-10-06 LAB — MIN INHIBITORY CONC (2 DRUGS)

## 2021-10-06 LAB — CBC WITH DIFFERENTIAL/PLATELET
Abs Immature Granulocytes: 1.1 10*3/uL — ABNORMAL HIGH (ref 0.00–0.07)
Basophils Absolute: 0.1 10*3/uL (ref 0.0–0.1)
Basophils Relative: 1 %
Eosinophils Absolute: 0.1 10*3/uL (ref 0.0–0.5)
Eosinophils Relative: 0 %
HCT: 25.1 % — ABNORMAL LOW (ref 36.0–46.0)
Hemoglobin: 8.1 g/dL — ABNORMAL LOW (ref 12.0–15.0)
Immature Granulocytes: 8 %
Lymphocytes Relative: 8 %
Lymphs Abs: 1 10*3/uL (ref 0.7–4.0)
MCH: 31.2 pg (ref 26.0–34.0)
MCHC: 32.3 g/dL (ref 30.0–36.0)
MCV: 96.5 fL (ref 80.0–100.0)
Monocytes Absolute: 0.8 10*3/uL (ref 0.1–1.0)
Monocytes Relative: 6 %
Neutro Abs: 10.6 10*3/uL — ABNORMAL HIGH (ref 1.7–7.7)
Neutrophils Relative %: 77 %
Platelets: 596 10*3/uL — ABNORMAL HIGH (ref 150–400)
RBC: 2.6 MIL/uL — ABNORMAL LOW (ref 3.87–5.11)
RDW: 14.1 % (ref 11.5–15.5)
Smear Review: INCREASED
WBC: 13.6 10*3/uL — ABNORMAL HIGH (ref 4.0–10.5)
nRBC: 0 % (ref 0.0–0.2)

## 2021-10-06 LAB — CULTURE, BLOOD (ROUTINE X 2)
Culture: NO GROWTH
Culture: NO GROWTH
Special Requests: ADEQUATE
Special Requests: ADEQUATE

## 2021-10-06 LAB — BASIC METABOLIC PANEL
Anion gap: 8 (ref 5–15)
BUN: 12 mg/dL (ref 6–20)
CO2: 29 mmol/L (ref 22–32)
Calcium: 8.2 mg/dL — ABNORMAL LOW (ref 8.9–10.3)
Chloride: 100 mmol/L (ref 98–111)
Creatinine, Ser: 0.77 mg/dL (ref 0.44–1.00)
GFR, Estimated: >60 mL/min (ref >60)
Glucose, Bld: 113 mg/dL — ABNORMAL HIGH (ref 70–99)
Potassium: 3.4 mmol/L — ABNORMAL LOW (ref 3.5–5.1)
Sodium: 137 mmol/L (ref 135–145)

## 2021-10-06 LAB — MIC RESULTS (2 DRUGS)

## 2021-10-06 MED ORDER — POTASSIUM CHLORIDE CRYS ER 20 MEQ PO TBCR
40.0000 meq | EXTENDED_RELEASE_TABLET | Freq: Every day | ORAL | 0 refills | Status: DC
Start: 2021-10-07 — End: 2021-11-12

## 2021-10-06 MED ORDER — POTASSIUM CHLORIDE CRYS ER 20 MEQ PO TBCR
40.0000 meq | EXTENDED_RELEASE_TABLET | Freq: Once | ORAL | Status: AC
  Administered 2021-10-06: 40 meq via ORAL
  Filled 2021-10-06: qty 2

## 2021-10-06 MED ORDER — HEPARIN SOD (PORK) LOCK FLUSH 100 UNIT/ML IV SOLN
250.0000 [IU] | INTRAVENOUS | Status: AC | PRN
  Administered 2021-10-06: 250 [IU]
  Filled 2021-10-06: qty 2.5

## 2021-10-06 MED ORDER — ENSURE ENLIVE PO LIQD
237.0000 mL | Freq: Two times a day (BID) | ORAL | 12 refills | Status: AC
Start: 2021-10-06 — End: ?

## 2021-10-06 MED ORDER — FERROUS SULFATE 325 (65 FE) MG PO TABS: 325.0000 mg | ORAL_TABLET | Freq: Every day | ORAL | 1 refills | Status: AC

## 2021-10-06 MED ORDER — SODIUM CHLORIDE 0.9 % IV SOLN
8.0000 mg/kg | Freq: Every day | INTRAVENOUS | Status: DC
  Administered 2021-10-06: 650 mg via INTRAVENOUS
  Filled 2021-10-06: qty 13

## 2021-10-06 MED ORDER — OXYCODONE-ACETAMINOPHEN 5-325 MG PO TABS: 1.0000 | ORAL_TABLET | ORAL | 0 refills | Status: DC | PRN

## 2021-10-06 MED ORDER — DAPTOMYCIN IV (FOR PTA / DISCHARGE USE ONLY): 650.0000 mg | INTRAVENOUS | 0 refills | Status: DC

## 2021-10-06 MED ORDER — TETANUS-DIPHTH-ACELL PERTUSSIS 5-2.5-18.5 LF-MCG/0.5 IM SUSY
0.5000 mL | PREFILLED_SYRINGE | Freq: Once | INTRAMUSCULAR | Status: AC
  Administered 2021-10-06: 0.5 mL via INTRAMUSCULAR
  Filled 2021-10-06: qty 0.5

## 2021-10-06 NOTE — Progress Notes (Signed)
? ?                              Orthopaedic Trauma Service Progress Note ? ?Patient ID: ?Jill Cox ?MRN: [id number] ?DOB/AGE: Dec 25, 1975 46 y.o. ? ?Subjective: ? ?Doing ok from ortho perspective ?Primary complaint is diarrhea  ? ?Rheum screening labs really unremarkable. RF ever so slightly elevated. Anti-CCP is negative as is ANA.  Low suspicion for autoimmune disease  ? ? ?ROS ?As above ? ?Objective:  ? ?VITALS:   ?Vitals:  ? 10/05/21 0756 10/05/21 1359 10/05/21 2100 10/06/21 [id number]  ?BP: (!) 120/57 123/61 (!) 99/41 (!) 113/51  ?Pulse: 88 73 100 77  ?Resp: 17 17 17 17   ?Temp: 98.4 ?F (36.9 ?C) 98.5 ?F (36.9 ?C) 98.3 ?F (36.8 ?C) 98.5 ?F (36.9 ?C)  ?TempSrc: Oral Oral Oral Oral  ?SpO2: 97% 95% 98% 97%  ?Weight:      ?Height:      ? ? ?Estimated body mass index is 29.19 kg/m? as calculated from the following: ?  Height as of this encounter: 5' 5.98" (1.676 m). ?  Weight as of this encounter: 82 kg. ? ? ?Intake/Output   ?   03/23 0701 ?03/24 0700 03/24 0701 ?03/25 0700  ? P.O. 720   ? Total Intake(mL/kg) 720 (8.8)   ? Net +720   ?     ? Urine Occurrence 4 x 1 x  ? Stool Occurrence  1 x  ?  ? ?LABS ? ?Results for orders placed or performed during the hospital encounter of 09/26/21 (from the past 24 hour(s))  ?CBC with Differential/Platelet     Status: Abnormal  ? Collection Time: 10/06/21  4:35 AM  ?Result Value Ref Range  ? WBC 13.6 (H) 4.0 - 10.5 K/uL  ? RBC 2.60 (L) 3.87 - 5.11 MIL/uL  ? Hemoglobin 8.1 (L) 12.0 - 15.0 g/dL  ? HCT 25.1 (L) 36.0 - 46.0 %  ? MCV 96.5 80.0 - 100.0 fL  ? MCH 31.2 26.0 - 34.0 pg  ? MCHC 32.3 30.0 - 36.0 g/dL  ? RDW 14.1 11.5 - 15.5 %  ? Platelets 596 (H) 150 - 400 K/uL  ? nRBC 0.0 0.0 - 0.2 %  ? Neutrophils Relative % 77 %  ? Neutro Abs 10.6 (H) 1.7 - 7.7 K/uL  ? Lymphocytes Relative 8 %  ? Lymphs Abs 1.0 0.7 - 4.0 K/uL  ? Monocytes Relative 6 %  ? Monocytes Absolute 0.8 0.1 - 1.0 K/uL  ? Eosinophils Relative 0 %  ? Eosinophils Absolute 0.1 0.0 -  0.5 K/uL  ? Basophils Relative 1 %  ? Basophils Absolute 0.1 0.0 - 0.1 K/uL  ? WBC Morphology MILD LEFT SHIFT (1-5% METAS, OCC MYELO, OCC BANDS)   ? Smear Review PLATELETS APPEAR INCREASED   ? Immature Granulocytes 8 %  ? Abs Immature Granulocytes 1.10 (H) 0.00 - 0.07 K/uL  ? Polychromasia PRESENT   ?Basic metabolic panel     Status: Abnormal  ? Collection Time: 10/06/21  4:35 AM  ?Result Value Ref Range  ? Sodium 137 135 - 145 mmol/L  ? Potassium 3.4 (L) 3.5 - 5.1 mmol/L  ? Chloride 100 98 - 111 mmol/L  ? CO2 29 22 - 32 mmol/L  ? Glucose, Bld 113 (H) 70 - 99 mg/dL  ? BUN 12 6 - 20 mg/dL  ? Creatinine, Ser 0.77 0.44 - 1.00 mg/dL  ? Calcium 8.2 (L) 8.9 -  10.3 mg/dL  ? GFR, Estimated >60 >60 mL/min  ? Anion gap 8 5 - 15  ? ? ? ?PHYSICAL EXAM:  ?Gen: in bed, NAD, husband and sister at bedside  ?Lungs: unlabored ?Cardiac: reg  ?Ext:  ?     Left thigh  ?            Dressing removed ?            Incision stable  ?            No drainage ?            Decreased erythema ?            Motor and sensory functions intact ?  ?     Right Lower Extremity  ?            Swelling stable and improved ?            much improved erythema  ?            + warmth  ?            Blood drainage form medial calf incision  ?            Penrose drains are stable ?  Penrose drains removed  ?  Incision looks clean  ?            Ext warm  ?            tolerates PROM of ankle  ?  ? ?Assessment/Plan: ?4 Days Post-Op  ? ?Principal Problem: ?  MRSA bacteremia ?Active Problems: ?  Depression ?  Septic joint ([place]) ?  Epidural abscess ?  Hypoalbuminemia: Severe ?  Hypokalemia ?  Constipation ?  Anemia ?  Edema due to hypervolemia ?  Debility ? ? ?Anti-infectives (From admission, onward)  ? ? Start     Dose/Rate Route Frequency Ordered Stop  ? 10/02/21 1310  vancomycin (VANCOCIN) powder  Status:  Discontinued       ?   As needed 10/02/21 1310 10/02/21 1508  ? 09/30/21 1030  tobramycin (NEBCIN) injection 40 mg       ? 40 mg Intramuscular  Once 09/30/21 0945     ? 09/28/21 1030  DAPTOmycin (CUBICIN) 650 mg in sodium chloride 0.9 % IVPB       ? 8 mg/kg ? 81.7 kg ?126 mL/hr over 30 Minutes Intravenous Daily 09/28/21 0932    ? 09/28/21 1030  ceftaroline (TEFLARO) 600 mg in sodium chloride 0.9 % 100 mL IVPB       ? 600 mg ?100 mL/hr over 60 Minutes Intravenous Every 8 hours 09/28/21 0932 10/12/21 2359  ? 09/27/21 0215  vancomycin (VANCOCIN) IVPB 1000 mg/200 mL premix  Status:  Discontinued       ? 1,000 mg ?200 mL/hr over 60 Minutes Intravenous 2 times daily 09/27/21 0115 09/28/21 0932  ? ?  ?. ? ?POD/HD#: 4 ? ?46 y/o MRSA bacteremia, diffuse myofascitis/cellulitis  ?  ?- R calf abscess s/p I&D ?            Dressing changed today. Change dressings every other day  ?             Drains removed ?  Adaptic, 4x4s, abd and tape ?  Leave open to air once drainage ceases  ?            Compression socks once drainage decreases ? Ok to shower and clean wounds  with soap and water only  ?            Ice/elevate ?  ? Heel cord stretching and ankle therex  ?            WBAT ?  ?- L thigh abscess/myositis/fascitis s/p I&D ?            Dressing changed today  ?            Changes as needed ?  Can leave open to air  ?  Shower and clean with soap and water only  ?            WBAT L leg ?  ?- R knee, L knee, R ankle effusions ?            Joint aspirations have not demonstrated any bacteria and they do not appear septic  ?             ?- ID ?            Abx per ID service  ?  ?  ?- Dispo: ?            Ortho issues stable ?            ok to dc from ortho standpoint ? Follow up in 10-14 days  ?  ? ? ?Jari Pigg, PA-C ?[phone] (C) ?10/06/2021, 10:38 AM ? ?[facility] ?[place] [address] ?[phone] Jenetta Downer) ?[phone] (F) ? ? ? ?After 5pm and on the weekends please log on to Amion, go to orthopaedics and the look under the [facility] Call for the provider(s) on call. You can also call our office at [phone] and then follow the prompts  to be connected to the call team.  ? Patient ID: Jill Cox, female   DOB: [birth year], 46 y.o.   MRN: [id number] ? ?

## 2021-10-06 NOTE — Progress Notes (Signed)
?   ? ? ? ? ?[facility] ? ?Date of Admission:  09/26/2021    ? ? ?Lines: ?Peripheral iv's ? ?Abx: ?3/16-c daptomycin/ceftaroline ? ?3/12-3/15 vancomycin ? ? ?ASSESSMENT: ?46 yo female admitted in transfer from [place] on 3/14 for severe metastatic mrsa sepsis ? ?No obvious risk factor for mrsa bsi ? ?She developed sx of polyarthralgia/bodyache and malaise 1-2 weeks prior to admission ? ?Initial evaluation at [facility] positive for bcx 3/11 (persistent daily until 3/13), all mri (thoracic/cervical spine), knees, wrists, femurs, right ankle are positive for soft tissue pyogenic focus and the spine mri series suggest posterior epidural abscess from cervical to upper thoracic area. L-spine mri shows potential C2 involvement as well  ? ?------ ?3/24 assessment ?3/16 and 3/19 bcx ngtd, since dapto/ceftaroline started ?3/18 s/p left wrist I&D --> cx staph aureus ?3/20 s/p I&D right calf and left thigh; cx staph aureus ?3/20 left knee and right ankle/knee aspirate as well- negative gram stain and culture -- clinically doesn't appear involved ? ?Patient going home today. Reasonable to start monotherapy today with dapto ?Clinically doing very well ? ?Will need at least 8 weeks abx. Dapto is planned for 5 more weeks then will transition to oral abx and need repeat spine mri in clinic ? ? ?PLAN: ?Start daptomycin monotherapy on discharge ?2.   Appointment/opat orders below ? ? ?OPAT Orders ?Discharge antibiotics to be given via PICC line ? ?Duration: ?5 weeks  ?End Date: ?11/13/21 ? ? ? ?[facility] Per Protocol: ? ?Home health RN for IV administration and teaching; PICC line care and labs.   ? ?Labs weekly while on IV antibiotics: ?_x_ CBC with differential ?__ BMP ?_x_ CMP ?_x_ CRP ?__ ESR ?__ Vancomycin trough ?__ CK ? ?_x_ Please pull PIC at completion of IV antibiotics ?__ Please leave PIC in place until doctor has seen patient or been notified ? ?Fax weekly labs to [phone] ? ?Clinic Follow Up  Appt: ?4/06 @ 11 am with Dr Gale Journey ? ?@ ? ?[facility] ?[place], [place], [place] [address] ?Phone: [phone] ? ? ? ?Principal Problem: ?  MRSA bacteremia ?Active Problems: ?  Depression ?  Septic joint ([place]) ?  Epidural abscess ?  Hypoalbuminemia: Severe ?  Hypokalemia ?  Constipation ?  Anemia ?  Edema due to hypervolemia ?  Debility ? ? ?No Known Allergies ? ?Scheduled Meds: ? Chlorhexidine Gluconate Cloth  6 each Topical Daily  ? enoxaparin (LOVENOX) injection  40 mg Subcutaneous Q24H  ? feeding supplement  237 mL Oral BID BM  ? ferrous sulfate  325 mg Oral Q breakfast  ? furosemide  40 mg Intravenous Q12H  ? sertraline  25 mg Oral q morning  ? sodium chloride flush  10-40 mL Intracatheter Q12H  ? tobramycin  40 mg Intramuscular Once  ? ?Continuous Infusions: ? ceFTAROline (TEFLARO) IV 600 mg (10/06/21 0506)  ? DAPTOmycin (CUBICIN)  IV 650 mg (10/05/21 2147)  ? ?PRN Meds:.acetaminophen **OR** [DISCONTINUED] acetaminophen, cyclobenzaprine, HYDROcodone-acetaminophen, HYDROmorphone (DILAUDID) injection, naLOXone (NARCAN)  injection, ondansetron (ZOFRAN) IV, sodium chloride flush ? ? ?SUBJECTIVE: ?Wounds look good on morning check ?Minimal if any tenderness in lue, right le and left LE ?No f/c ?Bcx remains negative ?No myalgia ? ? ? ?Review of Systems: ?ROS ?All other ROS was negative, except mentioned above ? ? ? ? ?OBJECTIVE: ?Vitals:  ? 10/05/21 0756 10/05/21 1359 10/05/21 2100 10/06/21 9242  ?BP: (!) 120/57 123/61 (!) 99/41 (!) 113/51  ?  Pulse: 88 73 100 77  ?Resp: _0 ?Temp: 98.4 ?F (36.9 ?C) 98.5 ?F (36.9 ?C) 98.3 ?F (36.8 ?C) 98.5 ?F (36.9 ?C)  ?TempSrc: Oral Oral Oral Oral  ?SpO2: 97% 95% 98% 97%  ?Weight:      ?Height:      ? ?Body mass index is 29.19 kg/m?. ? ?Physical Exam ?General/constitutional: no distress, pleasant ?HEENT: Normocephalic, PER, Conj Clear, EOMI, Oropharynx clear ?Neck supple ?CV: rrr no mrg ?Lungs: clear to auscultation, normal respiratory effort ?Abd: Soft,  Nontender ?Neuro: nonfocal ?Skin/ext/MSK: left wrist incision intact no erythema/tenderness/fluctuance; left thigh/right calf dressing c/d; knees/ankle full rom; bilateral LE edema 1+ ? ? ? ?Central line presence: rue picc site clean no erythema ? ? ? ? ?Lab Results ?Lab Results  ?Component Value Date  ? WBC 13.6 (H) 10/06/2021  ? HGB 8.1 (L) 10/06/2021  ? HCT 25.1 (L) 10/06/2021  ? MCV 96.5 10/06/2021  ? PLT 596 (H) 10/06/2021  ?  ?Lab Results  ?Component Value Date  ? CREATININE 0.77 10/06/2021  ? BUN 12 10/06/2021  ? NA 137 10/06/2021  ? K 3.4 (L) 10/06/2021  ? CL 100 10/06/2021  ? CO2 29 10/06/2021  ?  ?Lab Results  ?Component Value Date  ? ALT 26 10/01/2021  ? AST 25 10/01/2021  ? ALKPHOS 68 10/01/2021  ? BILITOT 0.8 10/01/2021  ?  ? ? ?Microbiology: ?Recent Results (from the past 240 hour(s))  ?Culture, blood (Routine X 2) w Reflex to ID Panel     Status: None  ? Collection Time: 09/27/21  5:43 AM  ? Specimen: BLOOD RIGHT HAND  ?Result Value Ref Range Status  ? Specimen Description BLOOD RIGHT HAND  Final  ? Special Requests   Final  ?  BOTTLES DRAWN AEROBIC AND ANAEROBIC Blood Culture adequate volume  ? Culture   Final  ?  NO GROWTH 5 DAYS ?Performed at [facility], [place] [address], [place], [place] [address] ?  ? Report Status 10/02/2021 FINAL  Final  ?Culture, blood (Routine X 2) w Reflex to ID Panel     Status: Abnormal (Preliminary result)  ? Collection Time: 09/27/21  5:43 AM  ? Specimen: BLOOD RIGHT FOREARM  ?Result Value Ref Range Status  ? Specimen Description BLOOD RIGHT FOREARM  Final  ? Special Requests   Final  ?  BOTTLES DRAWN AEROBIC AND ANAEROBIC Blood Culture adequate volume  ? Culture  Setup Time   Final  ?  GRAM POSITIVE COCCI IN CLUSTERS ?ANAEROBIC BOTTLE ONLY ?CRITICAL RESULT CALLED TO, READ BACK BY AND VERIFIED WITH: PHARMD VPamalee Leyden [id number] _1  FH  ?  ? Culture (A)  Final  ?  METHICILLIN RESISTANT STAPHYLOCOCCUS AUREUS ?Sent to [facility] for further susceptibility testing. ?Performed  at [facility], [place] [address] [place], [place] [address] ?  ? Report Status PENDING  Incomplete  ? Organism ID, Bacteria METHICILLIN RESISTANT STAPHYLOCOCCUS AUREUS  Final  ?    Susceptibility  ? Methicillin resistant staphylococcus aureus - MIC*  ?  CIPROFLOXACIN <=0.5 SENSITIVE Sensitive   ?  ERYTHROMYCIN <=0.25 SENSITIVE Sensitive   ?  GENTAMICIN <=0.5 SENSITIVE Sensitive   ?  OXACILLIN >=4 RESISTANT Resistant   ?  TETRACYCLINE <=1 SENSITIVE Sensitive   ?  VANCOMYCIN <=0.5 SENSITIVE Sensitive   ?  TRIMETH/SULFA <=10 SENSITIVE Sensitive   ?  CLINDAMYCIN <=0.25 SENSITIVE Sensitive   ?  RIFAMPIN <=0.5 SENSITIVE Sensitive   ?  Inducible Clindamycin NEGATIVE Sensitive   ?  *  METHICILLIN RESISTANT STAPHYLOCOCCUS AUREUS  ?Blood Culture ID Panel (Reflexed)     Status: Abnormal  ? Collection Time: 09/27/21  5:43 AM  ?Result Value Ref Range Status  ? Enterococcus faecalis NOT DETECTED NOT DETECTED Final  ? Enterococcus Faecium NOT DETECTED NOT DETECTED Final  ? Listeria monocytogenes NOT DETECTED NOT DETECTED Final  ? Staphylococcus species DETECTED (A) NOT DETECTED Final  ?  Comment: CRITICAL RESULT CALLED TO, READ BACK BY AND VERIFIED WITH: ?PHARMD VPamalee Leyden [id number] _0  FH   ?  ? Staphylococcus aureus (BCID) DETECTED (A) NOT DETECTED Final  ?  Comment: Methicillin (oxacillin)-resistant Staphylococcus aureus (MRSA). MRSA is predictably resistant to beta-lactam antibiotics (except ceftaroline). Preferred therapy is vancomycin unless clinically contraindicated. Patient requires contact precautions if  ?hospitalized. ?CRITICAL RESULT CALLED TO, READ BACK BY AND VERIFIED WITH: ?PHARMD VPamalee Leyden [id number] _1  FH   ?  ? Staphylococcus epidermidis NOT DETECTED NOT DETECTED Final  ? Staphylococcus lugdunensis NOT DETECTED NOT DETECTED Final  ? Streptococcus species NOT DETECTED NOT DETECTED Final  ? Streptococcus agalactiae NOT DETECTED NOT DETECTED Final  ? Streptococcus pneumoniae NOT DETECTED NOT DETECTED Final  ?  Streptococcus pyogenes NOT DETECTED NOT DETECTED Final  ? A.calcoaceticus-baumannii NOT DETECTED NOT DETECTED Final  ? Bacteroides fragilis NOT DETECTED NOT DETECTED Final  ? Enterobacterales NOT DETECTED NOT DETECTED F[place]

## 2021-10-06 NOTE — Progress Notes (Signed)
Occupational Therapy Treatment ?Patient Details ?Name: Jill Cox ?MRN: [id number] ?DOB: 1975/11/17 ?Today's Date: 10/06/2021 ? ? ?History of present illness Pt is a 46 y.o. female admitted to [facility] 09/24/21 with c/o polyarthralgias, transfer to [facility] 09/26/21 with MRSA bacteremia in the setting of epidural abscess of the thoracic spine, multiple suspected septic joints. S/p L forearm I&D and fourth dorsal compartment extensor tenosynovectomy on 3/18. Pt with BLE pain and multiple BLE abscesses; s/p bilateral knee and R ankle aspirations, L thigh I&D, R calf I&D on 3/20. PMH includes depression, vaping. ?  ?OT comments ? Patient continues to make steady progress towards goals in skilled OT session. Patient's session encompassed  education with regard to energy conservation and family education for safe discharge planning. Patient remains flat during session, but participatory and demonstrating good anticipatory awareness for discharge home. Patient with verbal acknowledgement of energy conservation strategies with husband listening and acknowledging understanding. All questions answered at end of session and patient with all needs met. Discharge remains appropriate, OT will continue to follow.   ? ?Recommendations for follow up therapy are one component of a multi-disciplinary discharge planning process, led by the attending physician.  Recommendations may be updated based on patient status, additional functional criteria and insurance authorization. ?   ?Follow Up Recommendations ? No OT follow up  ?  ?Assistance Recommended at Discharge PRN  ?Patient can return home with the following ? A little help with bathing/dressing/bathroom;Assistance with cooking/housework ?  ?Equipment Recommendations ? BSC/3in1;Other (comment) (Adaptive equipment)  ?  ?Recommendations for Other Services   ? ?  ?Precautions / Restrictions Precautions ?Precautions: Fall;Other (comment) ?Precaution Comments: symptomatic  hypotension ?Restrictions ?Weight Bearing Restrictions: Yes ?RLE Weight Bearing: Weight bearing as tolerated ?LLE Weight Bearing: Weight bearing as tolerated  ? ? ?  ? ?Mobility Bed Mobility ?Overal bed mobility: Modified Independent ?  ?  ?  ?  ?  ?  ?General bed mobility comments: transitions to sitting EOB ?  ? ?Transfers ?  ?  ?  ?  ?  ?  ?  ?  ?  ?General transfer comment: politely declining ?  ?  ?Balance   ?  ?  ?  ?  ?  ?  ?  ?  ?  ?  ?  ?  ?  ?  ?  ?  ?  ?  ?   ? ?ADL either performed or assessed with clinical judgement  ? ?ADL Overall ADL's : Needs assistance/impaired ?  ?  ?  ?  ?  ?  ?  ?  ?  ?  ?  ?  ?  ?  ?  ?  ?  ?  ?  ?General ADL Comments: Session focus on education and energy conservation for safe discharge home ?  ? ?Extremity/Trunk Assessment   ?  ?  ?  ?  ?  ? ?Vision   ?  ?  ?Perception   ?  ?Praxis   ?  ? ?Cognition Arousal/Alertness: Awake/alert ?Behavior During Therapy: Flat affect ?Overall Cognitive Status: Within Functional Limits for tasks assessed ?  ?  ?  ?  ?  ?  ?  ?  ?  ?  ?  ?  ?  ?  ?  ?  ?General Comments: demonstrating appropriate anticipatory awareness for discharge home ?  ?  ?   ?Exercises   ? ?  ?Shoulder Instructions   ? ? ?  ?General Comments    ? ? ?  Pertinent Vitals/ Pain       Pain Assessment ?Pain Assessment: Faces ?Faces Pain Scale: Hurts a little bit ?Pain Location: BLEs, LUE ?Pain Descriptors / Indicators: Tightness, Guarding, Sore ?Pain Intervention(s): Limited activity within patient's tolerance, Monitored during session, Repositioned ? ?Home Living   ?  ?  ?  ?  ?  ?  ?  ?  ?  ?  ?  ?  ?  ?  ?  ?  ?  ?  ? ?  ?Prior Functioning/Environment    ?  ?  ?  ?   ? ?Frequency ? Min 2X/week  ? ? ? ? ?  ?Progress Toward Goals ? ?OT Goals(current goals can now be found in the care plan section) ? Progress towards OT goals: Progressing toward goals ? ?Acute Rehab OT Goals ?Patient Stated Goal: go home ?OT Goal Formulation: With patient ?Time For Goal Achievement:  10/15/21 ?Potential to Achieve Goals: Good  ?Plan Discharge plan remains appropriate;Frequency remains appropriate   ? ?Co-evaluation ? ? ?   ?  ?  ?  ?  ? ?  ?AM-PAC OT "6 Clicks" Daily Activity     ?Outcome Measure ? ? Help from another person eating meals?: None ?Help from another person taking care of personal grooming?: None ?Help from another person toileting, which includes using toliet, bedpan, or urinal?: A Little ?Help from another person bathing (including washing, rinsing, drying)?: A Little ?Help from another person to put on and taking off regular upper body clothing?: None ?Help from another person to put on and taking off regular lower body clothing?: A Little ?6 Click Score: 21 ? ?  ?End of Session   ? ?OT Visit Diagnosis: Muscle weakness (generalized) (M62.81) ?Pain - Right/Left: Left ?Pain - part of body: Leg ?  ?Activity Tolerance Patient tolerated treatment well ?  ?Patient Left in bed;with call bell/phone within reach;with family/visitor present ?  ?Nurse Communication Mobility status ?  ? ?   ? ?Time: 1657-9038 ?OT Time Calculation (min): 17 min ? ?Charges: OT General Charges ?$OT Visit: 1 Visit ?OT Treatments ?$Self Care/Home Management : 8-22 mins ? ?Corinne Ports E. Dorn Hartshorne, OTR/L ?Acute Rehabilitation Services ?[phone] ?[phone]  ? ?Corinne Ports Pocahontas Cohenour ?10/06/2021, 2:37 PM ?

## 2021-10-06 NOTE — Plan of Care (Signed)

## 2021-10-06 NOTE — TOC Transition Note (Addendum)
Transition of Care (TOC) - CM/SW Discharge Note ? ? ?Patient Details  ?Name: Jill Cox ?MRN: [id number] ?Date of Birth: [birth year] ? ?Transition of Care [facility]) CM/SW Contact:  ?Sharin Mons, RN ?Phone Number: ?10/06/2021, 11:08 AM ? ? ?Clinical Narrative:    ?Patient will DC to: home ?Anticipated DC date: 10/06/2021 ?Family notified:yes ?Transport by: car ? ?Per MD patient ready for DC today. RN, patient and patient's husband notified of DC.  Home infusion teaching by [facility] with [facility] completed. [place] will provide pt with home health RN services. ?Per PT's recommendation:Outpatient PT. Pt agreeable to services, Referral made with [place] at [facility] and noted on AVS. ? ?Post hospital f/u noted on AVS. Pt without DME needs. ? ?Pt without Rx concerns. ? ?Husband to provide transportation to home.  ? ? ?RNCM will sign off for now as intervention is no longer needed. Please consult [place] again if new needs arise.  ? ? ?Final next level of care: [facility] ?Barriers to Discharge: No Barriers Identified ? ? ?Patient Goals and CMS Choice ?  ?  ?Choice offered to / list presented to : Patient ? ?Discharge Placement ?  ?           ?  ?  ?  ?  ? ?Discharge Plan and Services ?  ?  ?           ?DME Arranged: Other see comment (IV ABX THERAPY) ?  ?Date DME Agency Contacted: 10/05/21 ?Time DME Agency Contacted: [id number] ?Representative spoke with at DME Agency: [facility] ?[place]: RN ?[place] Agency: Other - See comment ([place]) ?Date HH Agency Contacted: 10/06/21 ?Time [place]: [phone] ?Representative spoke with at [place]: [place] ? ?Social Determinants of Health (SDOH) Interventions ?  ? ? ?Readmission Risk Interventions ?   ? View : No data to display.  ?  ?  ?  ? ? ? ? ? ?

## 2021-10-06 NOTE — Discharge Summary (Signed)
Physician Discharge Summary  ?Jill Cox WER:[id number] DOB: September 01, 1975 DOA: 09/26/2021 ? ?PCP: Bubba Camp, [address] ? ?Admit date: 09/26/2021 ?Discharge date: 10/06/2021 ? ?Admitted From: Home ?Disposition:  Home ? ?Discharge Condition:Stable ?CODE STATUS:FULL ?Diet recommendation:regular ? ? ?Brief/Interim Summary: ?Jill Cox is a 46 y.o. female with medical history significant for depression who is admitted to [facility] on 09/26/2021 by way of transfer from [facility] with MRSA bacteremia in the setting of epidural abscess of the thoracic spine, multiple suspected septic joints, after presenting from home to [facility] ED on 09/24/2021 complaining of polyarthralgias.  ?Patient began experiencing polyarthralgias with distribution that included bilateral wrist, bilateral knees, and right ankle.  Her PCP initiated 3-day course of prednisone, without any ensuing improvement in the symptoms.  Given the ongoing nature of these polyarthralgias, the patient then presented to [facility] ED on 09/23/2021 at which time she was started on Keflex and d/ced, presumably for associated cellulitis.  Blood cultures drawn at the time of the ED visit ultimately grew MRSA, and the patient was contacted the subsequent day and instructed to return back to [facility] ED for further evaluation and management, leading to her formal admission on 09/24/2021 for MRSA bacteremia. She was started on IV vancomycin, with repeat blood cultures also reportedly positive for MRSA. She underwent MRI of the bilateral knees demonstrating moderate to large effusions concerning for septic arthritis.  She also underwent right ankle MRI, which demonstrated evidence of effusion, potentially septic in nature.  Additionally, MRIs of the bilateral wrists showed evidence of tenosynovitis without evidence of overt septic arthritis.  Subsequently, MRI of the thoracic spine showed evidence of epidural abscess at T2/T3.  Cardiology was consulted, and ultimately the patient  underwent TTE as well as TEE which showed no evidence of bacterial endocarditis. Subsequently, the patient was transferred to [facility] for further evaluation and management of her MRSA bacteremia, epidural abscess of the thoracic spine, and multiple suspected septic joints requiring extensive washout procedure by orthopedic surgery, as above.ID, orthopedic surgery, neurosurgery were following.  She underwent  left forearm I&D with fourth dorsal compartment extensor tenosynovectomy, I&D right gastrocnemius abscess, left thigh abscess - aspiration bilateral knee and right ankle joints.  Her repeat blood cultures have been negative.  PICC line placed.  ID cleared her for discharge with daptomycin which will be continued through 11/13/2021.  Orthopedics cleared her for discharge and follow-up for as an outpatient in 2 to 3 weeks.  Medically stable for discharge home today. ? ?Following problems were addressed during her hospitalization: ? ?MRSA bacteremia ? - Multiple potential sources for MRSA bacteremia, including epidural abscess associated with T2/T3, with multiple potential septic joints including the bilateral knees. ?-TEE demonstrated no evidence of bacterial endocarditis ?-ID following and due to ongoing positive blood cultures, ongoing fevers IV antibiotics changed from IV vancomycin to IV daptomycin and IV teflaro.   ?-Repeat blood cultures have been negative.  S/P PICC line placement .  She will continue on daptomycin monotherapy till 5/1.  She will follow-up with ID. ?  ?  ?Septic joint ([place]) ?-Moderate to large effusions identified on bilateral knees per MRIs performed at [facility], concerning for septic arthritis in bilateral distribution, in the setting of presenting MRSA bacteremia. ?-CT 3/17 suggested abcess of L wrist/forearm .MRI R LE 3/19 suggests possible abscesses and probable septic arthritis of R knee - MRI L LE 3/19 confirmed new abscess of L thigh . ?-S/P  left forearm I&D with fourth dorsal  compartment extensor tenosynovectomy,I&D right gastrocnemius abscess,  left thigh abscess - aspiration bilateral knee and right ankle joints ?No further plan for operative intervention as per orthopedics ?-She will follow-up with orthopedics as an outpatient. ? ?Epidural abscess ?MRI of the thoracic spine performed during [facility] hospital course suggestive of epidural abscess at T2/T3, without associated acute focal neurologic deficits.  Neurosurgery thinks that there is no evidence of cord compression . ?-MRI of the C-spine with dorsal epidural abscess throughout the cervical spine contributing to moderate spinal stenosis.  No cord signal abnormality. ?-Per neurosurgery no surgical intervention at this time and recommending continuation of IV antibiotics. ?  ?Depression ?-On Zoloft. ?  ?Debility ?PT/OT recommend home health on discharge ? ?Edema due to hypervolemia ?- Patient with anasarca ?-2D echo/TEE done during this admission with normal EF,NWMA. ?-Unclear etiology but hypoalbuminemia might have contributed ?-She was on IV Lasix during this hospitalization.  Lasix discontinued on discharge ?  ?Anemia ?-Patient with no overt bleeding ?-Anemia panel showed low iron of 17,given IV iron ?-Continue oral iron supplementation ? ?Hypokalemia ?-Continue supplementation for next few days.  Check BMP in a week ? ?Hypoalbuminemia: Severe ?- Patient noted on lab work to have a albumin level of < 1.5 from 2.7 on 09/23/2021. ?-unclear etiology ?-Patient was given IV albumin ?-Placed on Ensure supplementation. ?-Diet has been liberalized to a regular diet. ?  ?  ?   ? ? ?Discharge Diagnoses:  ?Principal Problem: ?  MRSA bacteremia ?Active Problems: ?  Septic joint ([place]) ?  Epidural abscess ?  Depression ?  Hypoalbuminemia: Severe ?  Hypokalemia ?  Constipation ?  Anemia ?  Edema due to hypervolemia ?  Debility ? ? ? ?Discharge Instructions ? ?Discharge Instructions   ? ? [facility] pharmacist to adjust dose for  Vancomycin, Aminoglycosides and other anti-infective therapies as requested by physician.   Complete by: As directed ?  ? [facility] to provide Cath Flo 31m   Complete by: As directed ?  ? Administer for PICC line occlusion and as ordered by physician for other access device issues.  ? Anaphylaxis Kit: Provided to treat any anaphylactic reaction to the medication being provided to the patient if First Dose or when requested by physician   Complete by: As directed ?  ? Epinephrine 154mml vial / amp: Administer 0.39m36m0.39ml7mubcutaneously once for moderate to severe anaphylaxis, nurse to call physician and pharmacy when reaction occurs and call 911 if needed for immediate care  ? Diphenhydramine 50mg539mIV vial: Administer 25-50mg 57mM PRN for first dose reaction, rash, itching, mild reaction, nurse to call physician and pharmacy when reaction occurs  ? Sodium Chloride 0.9% NS 500ml I539mdminister if needed for hypovolemic blood pressure drop or as ordered by physician after call to physician with anaphylactic reaction  ? Change dressing on IV access line weekly and PRN   Complete by: As directed ?  ? Diet - low sodium heart healthy   Complete by: As directed ?  ? Diet general   Complete by: As directed ?  ? Discharge instructions   Complete by: As directed ?  ? 1)Please take prescribed medications as instructed ?2)Follow up with your PCP in a week.  Do a CBC, BMP test during the follow-up ?3)Follow up with home health. ?4)Follow up with infectious disease and orthopedics as an outpatient.  ? Flush IV access with Sodium Chloride 0.9% and Heparin 10 units/ml or 100 units/ml   Complete by: As directed ?  ? Home infusion instructions -  [facility]   Complete by: As directed ?  ? Instructions: Flush IV access with Sodium Chloride 0.9% and Heparin 10units/ml or 100units/ml  ? Change dressing on IV access line: Weekly and PRN  ? Instructions Cath Flo 76m: Administer for PICC Line occlusion and as  ordered by physician for other access device  ? [facility] pharmacist to adjust dose for: Vancomycin, Aminoglycosides and other anti-infective therapies as requested by physician  ? Increase activity slowly   Comp

## 2021-10-06 NOTE — Progress Notes (Signed)
Provided discharge education/instructions to Pt, all questions and concerns addressed, PICC capped by IV team. Pt not in acute distress, discharged home with belongings accompanied by husband. ?

## 2021-10-06 NOTE — Discharge Instructions (Addendum)
? ?[facility] Discharge Instructions ? ? ?General Discharge Instructions ? ? ?WEIGHT BEARING STATUS: Weightbearing as tolerated Bilateral lower extremities  ? ?RANGE OF MOTION/ACTIVITY: unrestricted range of motion bilateral knees and ankles.  Heel cord stretching and ankle range of motion exercises several times a day.  ? ?Wound Care: wound care every other day starting on 10/08/2021. Once drainage ceases you can leave open to air. Recommend compression socks to legs as well. You can wear the Left one now and when drainage slows on R leg then you can put the compression sock on the right leg  ? ?Discharge Wound Care Instructions ? ?Do NOT apply any ointments, solutions or lotions to pin sites or surgical wounds.  These prevent needed drainage and even though solutions like hydrogen peroxide kill bacteria, they also damage cells lining the pin sites that help fight infection.  Applying lotions or ointments can keep the wounds moist and can cause them to breakdown and open up as well. This can increase the risk for infection. When in doubt call the office. ? ? ?If any drainage is noted, use one layer of adaptic or Mepitel, then gauze, ABD pads and tape ? ?[email or web address] ?[email or web address] ? ?CheapWipes.[email or web address] ? ?These dressing supplies should be available at local medical supply stores ([facility], [facility], etc). They are not usually carried at places like [facility], [facility], [facility], etc ? ?Once the incision is completely dry and without drainage, it may be left open to air out.  Showering may begin 36-48 hours later.  Cleaning gently with soap and water. ? ? ?Diet: as you were eating previously.  Can use over the counter stool softeners and bowel preparations, such as  Miralax, to help with bowel movements.  Narcotics can be constipating.  Be sure to drink plenty of fluids ? ?PAIN MEDICATION USE AND EXPECTATIONS ? You have likely been given narcotic medications to help control your pain.  After a traumatic event that results in an fracture (broken bone) with or without surgery, it is ok to use narcotic pain medications to help control one's pain.  We understand that everyone responds to pain differently and each individual patient will be evaluated on a regular basis for the continued need for narcotic medications. Ideally, narcotic medication use should last no more than 6-8 weeks (coinciding with fracture healing).  ? As a patient it is your responsibility as well to monitor narcotic medication use and report the amount and frequency you use these medications when you come to your office visit.  ? We would also advise that if you are using narcotic medications, you should take a dose prior to therapy to maximize you participation. ? ?IF YOU ARE ON NARCOTIC MEDICATIONS IT IS NOT PERMISSIBLE TO OPERATE A MOTOR VEHICLE (MOTORCYCLE/CAR/TRUCK/MOPED) OR HEAVY MACHINERY ?DO NOT MIX NARCOTICS WITH OTHER CNS (CENTRAL NERVOUS SYSTEM) DEPRESSANTS SUCH AS ALCOHOL ? ? ?POST-OPERATIVE OPIOID TAPER INSTRUCTIONS: ?It is important to wean off of your opioid medication as soon as possible. If you do not need pain medication after your surgery it is ok to stop day one. ?Opioids include: ?Codeine, Hydrocodone(Norco, Vicodin), Oxycodone(Percocet, oxycontin) and hydromorphone amongst others.  ?Long term and even short term use of opiods can cause: ?Increased pain response ?Dependence ?Constipation ?Depression ?Respiratory depression ?And more.  ?Withdrawal symptoms can include ?Flu like symptoms ?Nausea, vomiting ?And more ?Techniques to manage these symptoms ?Hydrate well ?Eat regular healthy meals ?Stay active ?Use relaxation techniques(deep breathing, meditating, yoga) ?Do Not substitute Alcohol  to help  with tapering ?If you have been on opioids for less than two weeks and do not have pain than it is ok to stop all together.  ?Plan to wean off of opioids ?This plan should start within one week post op of your fracture surgery  ?Maintain the same interval or time between taking each dose and first decrease the dose.  ?Cut the total daily intake of opioids by one tablet each day ?Next start to increase the time between doses. ?The last dose that should be eliminated is the evening dose.  ? ? ?STOP SMOKING OR USING NICOTINE PRODUCTS!!!! ? As discussed nicotine severely impairs your body's ability to heal surgical and traumatic wounds but also impairs bone healing.  Wounds and bone heal by forming microscopic blood vessels (angiogenesis) and nicotine is a vasoconstrictor (essentially, shrinks blood vessels).  Therefore, if vasoconstriction occurs to these microscopic blood vessels they essentially disappear and are unable to deliver necessary nutrients to the healing tissue.  This is one modifiable factor that you can do to dramatically increase your chances of healing your injury.   ? (This means no smoking, no nicotine gum, patches, etc) ? ?DO NOT USE NONSTEROIDAL ANTI-INFLAMMATORY DRUGS (NSAID'S) ? Using products such as Advil (ibuprofen), Aleve (naproxen), Motrin (ibuprofen) for additional pain control during fracture healing can delay and/or prevent the healing response.  If you would like to take over the counter (OTC) medication, Tylenol (acetaminophen) is ok.  However, some narcotic medications that are given for pain control contain acetaminophen as well. Therefore, you should not exceed more than 4000 mg of tylenol in a day if you do not have liver disease.  Also note that there are may OTC medicines, such as cold medicines and allergy medicines that my contain tylenol as well.  If you have any questions about medications and/or interactions please ask your doctor/PA or your pharmacist.  ?   ? ?ICE  AND ELEVATE INJURED/OPERATIVE EXTREMITY ? Using ice and elevating the injured extremity above your heart can help with swelling and pain control.  Icing in a pulsatile fashion, such as 20 minutes on and 20 minutes off, can be followed.   ? Do not place ice directly on skin. Make sure there is a barrier between to skin and the ice pack.   ? Using frozen items such as frozen peas works well as the conform nicely to the are that needs to be iced. ? ?USE AN ACE WRAP OR TED HOSE FOR SWELLING CONTROL ? In addition to icing and elevation, Ace wraps or TED hose are used to help limit and resolve swelling.  It is recommended to use Ace wraps or TED hose until you are informed to stop.   ? When using Ace Wraps start the wrapping distally (farthest away from the body) and wrap proximally (closer to the body) ?  Example: If you had surgery on your leg or thing and you do not have a splint on, start the ace wrap at the toes and work your way up to the thigh ?       If you had surgery on your upper extremity and do not have a splint on, start the ace wrap at your fingers and work your way up to the upper arm ? ?IF YOU ARE IN A SPLINT OR CAST DO NOT REMOVE IT FOR ANY REASON  ? If your splint gets wet for any reason please contact the office immediately. You may shower in your splint or cast as  long as you keep it dry.  This can be done by wrapping in a cast cover or garbage back (or similar) ? Do Not stick any thing down your splint or cast such as pencils, money, or hangers to try and scratch yourself with.  If you feel itchy take benadryl as prescribed on the bottle for itching ? ?IF YOU ARE IN A CAM BOOT (BLACK BOOT) ? You may remove boot periodically. Perform daily dressing changes as noted below.  Wash the liner of the boot regularly and wear a sock when wearing the boot. It is recommended that you sleep in the boot until told otherwise ? ? ? ?Call office for the following: ?Temperature greater than 101F ?Persistent nausea  and vomiting ?Severe uncontrolled pain ?Redness, tenderness, or signs of infection (pain, swelling, redness, odor or green/yellow discharge around the site) ?Difficulty breathing, headache or visual disturba

## 2021-10-07 LAB — AEROBIC/ANAEROBIC CULTURE W GRAM STAIN (SURGICAL/DEEP WOUND)
Culture: NO GROWTH
Culture: NO GROWTH

## 2021-10-09 ENCOUNTER — Telehealth: Payer: Self-pay

## 2021-10-09 NOTE — Telephone Encounter (Signed)
Patient's husband states patient is having abdominal pain and poor appetite. He would like the patient to be evaluated. Patient scheduled for tomorrow.  ?

## 2021-10-09 NOTE — Telephone Encounter (Signed)
If it is more than 6 a day, with fever, abd pain , poor appetitw, malaise, she should come for cdiff evaluation either our clinic or urgent care.  ? ? ?If it is less than 6 a day keep up with spirts drink hydration. ? ?She should be seen this or next week. Thanks

## 2021-10-09 NOTE — Telephone Encounter (Signed)
Patient's husband called stating the patient is experiencing loose stools since starting the IV daptomycin. Patient has been taking OTC Imodium and having no relief. Please advise. ?Kirat Mezquita T Kenita Bines ?  ?

## 2021-10-10 ENCOUNTER — Other Ambulatory Visit: Payer: Self-pay

## 2021-10-10 ENCOUNTER — Encounter: Payer: Self-pay | Admitting: Internal Medicine

## 2021-10-10 ENCOUNTER — Ambulatory Visit (INDEPENDENT_AMBULATORY_CARE_PROVIDER_SITE_OTHER): Payer: 59 | Admitting: Internal Medicine

## 2021-10-10 VITALS — BP 142/79 | HR 93 | Temp 98.0°F | Resp 16 | Wt 191.8 lb

## 2021-10-10 DIAGNOSIS — L0591 Pilonidal cyst without abscess: Secondary | ICD-10-CM

## 2021-10-10 DIAGNOSIS — R7881 Bacteremia: Secondary | ICD-10-CM

## 2021-10-10 DIAGNOSIS — B9562 Methicillin resistant Staphylococcus aureus infection as the cause of diseases classified elsewhere: Secondary | ICD-10-CM | POA: Diagnosis not present

## 2021-10-10 DIAGNOSIS — R197 Diarrhea, unspecified: Secondary | ICD-10-CM | POA: Diagnosis not present

## 2021-10-10 LAB — CULTURE, BLOOD (ROUTINE X 2): Special Requests: ADEQUATE

## 2021-10-10 MED ORDER — LIDOCAINE 5 % EX OINT: 1.0000 "application " | TOPICAL_OINTMENT | Freq: Four times a day (QID) | CUTANEOUS | 1 refills | Status: AC | PRN

## 2021-10-10 NOTE — Patient Instructions (Addendum)
This doesn't appear to be cdiff infection ? ?I suspect it is just raw skin from diarrhea. You can use vaseline to help. Lidocane numbing meds is prescribed as well ? ?Keep eye on your pilonidal cyst area if there is increased discharge, opening, I would advise talking to your surgeon ? ? ?The redness in the wrist please also keep eye on that and let me/your surgeon know if it continues to get larger, more red, more swollen, and more painful ? ? ?You can add fiber to your diet. No need for immodium at this time ? ? ?See you in 3 weeks otherwise ?

## 2021-10-10 NOTE — Progress Notes (Signed)
?  ? ? ? ? ?[facility] ? ?Patient Active Problem List  ? Diagnosis Date Noted  ? Debility 10/05/2021  ? Hypoalbuminemia: Severe 09/28/2021  ? Hypokalemia 09/28/2021  ? Constipation 09/28/2021  ? Anemia 09/28/2021  ? Edema due to hypervolemia 09/28/2021  ? Septic joint (HCC) 09/27/2021  ? 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 MRSA bacteremia 09/24/2021  ? Polyarthritis 09/24/2021  ? Severe sepsis (HCC) 09/24/2021  ? Hypercalcemia 09/24/2021  ? Depression 09/24/2021  ? Engages in vaping 09/24/2021  ? ? ? ? ?Subjective:  ? ? Patient ID: Jill Cox, female    DOB: 06/19/1976, 46 y.o.   MRN: [id number] ? ?Chief Complaint  ?Patient presents with  ? Hospitalization Follow-up  ?  Patient reports having diarrhea x 5 days. Patient is also having pain while using the bathroom.   ? ? ?HPI: ? ?Jill Cox is a 46 y.o. female here for hospital follow up of disseminated mrsa infection but with acute issue of diarrhea ? ?Patient reported receiving an enema the day prior to release. Severe watery diarrhea the day of release continuing into post-discharge day #2.  ? ?Stool is more formed now, but Butt feels raw and painful ? ?She took 1 serving immodium 3 days prior to this visit only ? ?Yesterday 3 bowel movement. Today so far 1 bm ? ?Appetite hasn't "been the same since discharge." Last night ate well.  ? ?Hx pilonidal cyst/removed. "Sensitive when laying on my back a long time." Currently top of that area is numb, and nervous and worry if it'll come back ? ?She reports slight redness in the left wrist incision; the right calf and left thigh incision are without redness. No significant/new pain left wrist. No f/c ? ?Tolerating daptomycin -- no rash/chest pain/cough/dyspnea/myalgia ? ? ?No Known Allergies ? ? ? ?Outpatient Medications Prior to Visit  ?Medication Sig Dispense Refill  ? acetaminophen (TYLENOL) 325 MG tablet Take 2 tablets (650 mg total) by mouth every 6 (six) hours as needed for mild pain or  fever.    ? cyclobenzaprine (FLEXERIL) 10 MG tablet Take 1 tablet (10 mg total) by mouth 3 (three) times daily as needed for muscle spasms. 21 tablet 0  ? daptomycin (CUBICIN) IVPB Inject 650 mg into the vein daily. Indication:  severe MRSA sepsis ?First Dose: Yes ?Last Day of Therapy:  11/13/21 ?Labs - Once weekly:  CBC/D, BMP, and CPK ?Labs - Every other week:  ESR and CRP ?Method of administration: IV Push ?Method of administration may be changed at the discretion of home infusion pharmacist based upon assessment of the patient and/or caregiver's ability to self-administer the medication ordered. 39 Units 0  ? feeding supplement (ENSURE ENLIVE / ENSURE PLUS) LIQD Take 237 mLs by mouth 2 (two) times daily between meals. 237 mL 12  ? ferrous sulfate 325 (65 FE) MG tablet Take 1 tablet (325 mg total) by mouth daily with breakfast. 30 tablet 1  ? oxyCODONE-acetaminophen (PERCOCET/ROXICET) 5-325 MG tablet Take 1 tablet by mouth every 4 (four) hours as needed for moderate pain. 30 tablet 0  ? sertraline (ZOLOFT) 25 MG tablet Take 25 mg by mouth every morning.    ? potassium chloride SA (KLOR-CON M) 20 MEQ tablet Take 2 tablets (40 mEq total) by mouth daily. (Patient not taking: Reported on 10/10/2021) 6 tablet 0  ? ?No facility-administered medications prior to visit.  ? ? ? ?Social History  ? ?Socioeconomic History  ? Marital status: Married  ?    Spouse name: Not on file  ? Number of children: Not on file  ? Years of education: Not on file  ? Highest education level: Not on file  ?Occupational History  ? Not on file  ?Tobacco Use  ? Smoking status: Never  ? Smokeless tobacco: Never  ? Tobacco comments:  ?  Pt is doing vaping  ?Substance and Sexual Activity  ? Alcohol use: Not Currently  ? Drug use: Never  ? Sexual activity: Not on file  ?Other Topics Concern  ? Not on file  ?Social History Narrative  ? Not on file  ? ?Social Determinants of Health  ? ?Financial Resource Strain: Not on file  ?Food Insecurity: Not on file   ?Transportation Needs: Not on file  ?Physical Activity: Not on file  ?Stress: Not on file  ?Social Connections: Not on file  ?Intimate Partner Violence: Not on file  ? ? ? ? ?Review of Systems ?   ? ?Objective:  ?  ?BP (!) 142/79   Pulse 93   Temp 98 ?F (36.7 ?C) (Temporal)   Resp 16   Wt 191 lb 12.8 oz (87 kg)   LMP  (LMP Unknown)   SpO2 100%   BMI 30.97 kg/m?  ?Nursing note and vital signs reviewed. ? ?Physical Exam ? ?   ?General/constitutional: no distress, pleasant ?HEENT: Normocephalic, PER, Conj Clear, EOMI, Oropharynx clear ?Neck supple ?CV: rrr no mrg ?Lungs: clear to auscultation, normal respiratory effort ?Abd: Soft, Nontender ?Ext: no edema ?Skin: No Rash -- see picture below (yesterday and today) -- slight stable redness left wrist nontender on exam ?Neuro: nonfocal ?MSK: no peripheral joint swelling/tenderness/warmth; back spines nontender ? ? ?Central line presence: no erythema/purulence ? ?Skin pic's; ? ? ? ? ? ? ? ? ?Labs: ? ?Micro: ? ?Serology: ? ?Imaging: ? ?Assessment & Plan:  ? ?Problem List Items Addressed This Visit   ? ?  ? Other  ? MRSA bacteremia  ? ?Other Visit Diagnoses   ? ? Pilonidal cyst    -  Primary  ? Diarrhea, unspecified type      ? ?  ? ?46 yo female non-ivdu here for hospital f/u on mrsa septicemia, s/p I&D left wrist/thigh and right calf. Tee negative for vegetation. On daptomycin monotherapy after about 1 week combination ceftaroline/dapto for persistent bacteremia in setting poor source control ? ? ?Diarrhea (enema and abx associated). Improved. Irritation perirectal. No suspicion cdiff at this time.  ?-monitor ?-fiber ?-I do not feel immodium is needed at this time ?-she appears rather clinically well I do not see need to get urgent labs today -- will review opat labs ? ?Mrsa infection/bsi ?S/p left wrist, left thigh, and right calf I&D ?-left wrist slight erythema -- advise her to monitor for increasing redness/pain/purulence/dehiscence -- no indication for abx  change. If this gets worse, will discuss with ortho regarding possible need for imaging and repeat I&D ?-left thigh/right calf incision clinically look great and healing well ?-f/u 3 weeks ?-continue daptomycin -- no myalgia/cough/chest pain ? ? ? ? ?Follow-up: Return in about 3 weeks (around 10/31/2021). ? ? ? ? ? ?Trung T Vu, MD ?[facility] ?[phone]  pager   [phone] cell ?10/10/2021, 9:53 AM ? ?

## 2021-10-16 ENCOUNTER — Telehealth: Payer: Self-pay

## 2021-10-16 NOTE — Telephone Encounter (Signed)
Faith with [facility] called regarding the patient. She stated she is currently in the patient home and states patient does have some redness around the stitches on her leg, area is warm to the touch, discolored and painful. Patient does not have a fever at the this time. Faith also stated she and the patient are concerned the patient may have a DVT. Patient stated she is seeing her pcp today and may go to the ED today vs waiting to be seen tomorrow.  ?

## 2021-10-16 NOTE — Telephone Encounter (Signed)
Patient husband called to report that patient wound looks worse than before - bump in wound site, discolored, and redness around wound. Denies fever at the moment. Patient home health nurse is seeing patient today. Informed Jeneen Rinks that the [facility] nurse could give [place] a call after she sees patient. James scheduled appointment for patient tomorrow 4/4 to see Dr.Vu, ? ? ? ?

## 2021-10-17 ENCOUNTER — Ambulatory Visit: Payer: 59 | Admitting: Internal Medicine

## 2021-10-19 ENCOUNTER — Inpatient Hospital Stay: Payer: 59 | Admitting: Internal Medicine

## 2021-10-24 ENCOUNTER — Ambulatory Visit: Payer: 59 | Attending: Family Medicine | Admitting: Physical Therapy

## 2021-10-24 ENCOUNTER — Encounter: Payer: Self-pay | Admitting: Physical Therapy

## 2021-10-24 DIAGNOSIS — M25662 Stiffness of left knee, not elsewhere classified: Secondary | ICD-10-CM | POA: Diagnosis present

## 2021-10-24 DIAGNOSIS — R262 Difficulty in walking, not elsewhere classified: Secondary | ICD-10-CM | POA: Diagnosis not present

## 2021-10-24 DIAGNOSIS — M25671 Stiffness of right ankle, not elsewhere classified: Secondary | ICD-10-CM | POA: Diagnosis present

## 2021-10-24 DIAGNOSIS — M6281 Muscle weakness (generalized): Secondary | ICD-10-CM | POA: Diagnosis present

## 2021-10-24 DIAGNOSIS — M25661 Stiffness of right knee, not elsewhere classified: Secondary | ICD-10-CM | POA: Diagnosis present

## 2021-10-24 NOTE — Therapy (Signed)
?OUTPATIENT PHYSICAL THERAPY LOWER EXTREMITY EVALUATION ? ? ?Patient Name: Jill Cox ?MRN: [id number] ?DOB:10-Jun-1976, 46 y.o., female ?Today's Date: 10/26/2021 ? ? PT End of Session - 10/25/21 1350   ? ? Visit Number 1   ? Number of Visits 17   ? Date for PT Re-Evaluation 12/21/21   ? Authorization Type [employer] - VL based on medical necessity   ? Progress Note Due on Visit 10   ? PT Start Time [phone]   ? PT Stop Time [id number]   ? PT Time Calculation (min) 50 min   ? Activity Tolerance Patient tolerated treatment well;Patient limited by pain   comorbid back pain limiting bed mobility  ? Behavior During Therapy [facility] for tasks assessed/performed;Agitated   ? ?  ?  ? ?  ? ? ?Past Medical History:  ?Diagnosis Date  ? Depression   ? Engages in [place]   ? PONV (postoperative nausea and vomiting)   ? ?Past Surgical History:  ?Procedure Laterality Date  ? CHOLECYSTECTOMY    ? I & D EXTREMITY Left 09/30/2021  ? Procedure: IRRIGATION AND DEBRIDEMENT LEFT FOREARM ABSCESS;  Surgeon: Iran Planas, MD;  Location: [place];  Service: Orthopedics;  Laterality: Left;  ? I & D EXTREMITY Bilateral 10/02/2021  ? Procedure: IRRIGATION AND DEBRIDEMENT EXTREMITY;  Surgeon: Altamese Inman, MD;  Location: [place];  Service: Orthopedics;  Laterality: Bilateral;  arthrocentsis bilateral knees and right ankle ?  ? TEE WITHOUT CARDIOVERSION N/A 09/26/2021  ? Procedure: TRANSESOPHAGEAL ECHOCARDIOGRAM (TEE);  Surgeon: Minna Merritts, MD;  Location: [facility];  Service: Cardiovascular;  Laterality: N/A;  ? ?Patient Active Problem List  ? Diagnosis Date Noted  ? Debility 10/05/2021  ? Hypoalbuminemia: Severe 09/28/2021  ? Hypokalemia 09/28/2021  ? Constipation 09/28/2021  ? Anemia 09/28/2021  ? Edema due to hypervolemia 09/28/2021  ? Septic joint ([place]) 09/27/2021  ? 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 MRSA bacteremia 09/24/2021  ? Polyarthritis 09/24/2021  ? Severe sepsis ([place]) 09/24/2021  ? Hypercalcemia 09/24/2021  ? Depression 09/24/2021  ? Engages in [place]  09/24/2021  ? ? ?PCP: Bubba Camp, FNP ? ?REFERRING PROVIDER: Bubba Camp, FNP ? ?REFERRING DIAG: M00.9 (ICD-10-CM) - Septic arthritis, due to unspecified organism, septic arthritis of unspecified location [facility]) ? ?THERAPY DIAG:  ?Difficulty in walking, not elsewhere classified ? ?Muscle weakness (generalized) ? ?Stiffness of left knee, not elsewhere classified ? ?Stiffness of right knee, not elsewhere classified ? ?Stiffness of right ankle, not elsewhere classified ? ?ONSET DATE: 09/23/2021 ? ?SUBJECTIVE:                                                                                                                                                                                          ? ?  SUBJECTIVE STATEMENT: ?Patient is a 46 year old female with primary concern for pain and mobility deficits in lower extremities. Pt had prior referral from her 09/21/21 office visit for R paraspinal pain. Current PT referral from 10/06/21 is for septic arthritis of unspecified location. Patient spent 2.5 weeks in hospital for MRSA infection. Pt went to ED 09/23/21 for myalgia following office visit for R periscapular pain on 09/21/21. Pt reports being in hospital due to sepsis until 10/06/21.   ? ? ?PERTINENT HISTORY:  ?Patient is a 46 year old female with primary concern for pain and mobility deficits in lower extremities. Pt had prior referral from her 09/21/21 office visit for R paraspinal pain. Current PT referral from 10/06/21 is for septic arthritis of unspecified location. Patient spent 2.5 weeks in hospital for MRSA infection. Pt went to ED 09/23/21 for myalgia following office visit for R periscapular pain on 09/21/21. Pt reports being in hospital due to sepsis until 10/06/21.  She underwent left forearm I&D with fourth dorsal compartment extensor tenosynovectomy, I&D right gastrocnemius abscess, left thigh abscess - aspiration bilateral knee and right ankle joints.  Her repeat blood cultures have been negative for MRSA.  Pt reports numerous complaints related to hospital stay and Hx of I&D on L wrist, L thigh, and R leg. Pt feels that her R ankle is 75% better presently. Patient reports that her lower back is bothering her notably after her sepsis/hospitalization. Patient had infection starting in L ankle and reports her L Achilles tendon has been "locked." She reports difficulty with flexing her R knee. Patient had infection also affecting her L wrist. Patient reports pain along R periscapular region primarily that hurts with every time she moved at onset. Pt felt that injections helped for one day along R periscapular region. 2 weeks ago, patient reports working hard on her exercises given from hospital and found that she had to visit urgent care due to R ankle incision (site of I&D) swelling and patient having ecchymosis proximal to her R ankle. Pt has PICC line and will continue IV antibiotics until May 20th. Pt reports she is able to walk up to 20-30 minutes. Pain is worse in morning. Patient reports she slept for 6 hours through the night for first time last night. Patient reports increased urinary urgency since this incident. Patient reports feeling more cold recently, low hemoglobin. Patient reports that infection is under control presently. Pt reports difficulty with transferring into pick-up truck. Patient reports no major weight loss ("according to scale" per patient).  ? ?PAIN:  ?Are you having pain? Yes: NPRS scale: 4/10 ?Pain location: Pain presentation is complicated; pt wishes to focus on her lower extremities; pt reports back pain at arrival ?Pain description: lower extremities aching ?Aggravating factors: prolonged walking ?Relieving factors: none stated ? ? ?PRECAUTIONS: hx of sepsis and recent hospitalization, pt has PICC line for IV antibiotics ? ?WEIGHT BEARING RESTRICTIONS No ? ?FALLS:  ?Has patient fallen in last 6 months? No ? ?LIVING ENVIRONMENT: ?Lives with: lives with their spouse ?Lives in:  House/apartment ?Stairs: Yes: External: 6 steps; can reach both handrails ?Has following equipment at home: [employer] Rile - 2 wheeled, pt has weaned from use of walker ? 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 ?PLOF: [place] ? ?PATIENT GOALS: Patient wants more movement in her L hand, more movement/flexibility in her knees.  ? ? ?OBJECTIVE:  ? ?DIAGNOSTIC FINDINGS:  ?Blood cultures positive for MRSA, R ankle MRI demonstrating effusion, MRI of bilateral wrists demonstrating tenosynovitis, MRI of  the thoracic spine showed evidence of epidural abscess at T2/T3 ? ?PATIENT SURVEYS:  ?FOTO 63 ? ?SCREENING FOR RED FLAGS: ?Bowel or bladder incontinence: Yes: pt reports significant urgency to go to the bathroom ?Spinal tumors: No ?Cauda equina syndrome: No ?Compression fracture: No ?Abdominal aneurysm: No ? ?COGNITION: ? Overall cognitive status: Within functional limits for tasks assessed   ?  ?SENSATION: ?Not tested ? ?PALPATION: ?Tenderness to palpation along R medial/lateral gastrocnemius, peri-incisional tenderness to palpation along R operative incision and L wrist incision  ? ? ? ?LE MMT: ? ?MMT Right ?10/24/2021 Left ?10/24/2021  ?Hip flexion 4 4  ?Hip extension    ?Hip abduction    ?Hip adduction    ?Hip internal rotation    ?Hip external rotation    ?Knee flexion 4 5  ?Knee extension 5 5  ?Ankle dorsiflexion 5 5  ?Ankle plantarflexion 4 4  ?Ankle inversion 5 5  ?Ankle eversion 5 5  ? (Blank rows = not tested) ? ?LE AROM: ? ?AROM Right ?10/24/2021 Left ?10/24/2021  ?Hip flexion    ?Hip extension    ?Hip abduction    ?Hip adduction    ?Hip internal rotation    ?Hip external rotation    ?Knee flexion 98 108  ?Knee extension -6 -1  ?Ankle dorsiflexion 6 15  ?Ankle plantarflexion    ?Ankle inversion    ?Ankle eversion    ? (Blank rows = not tested) ? ? ?FUNCTIONAL TASKS:  ?Sit to stand: heavy UE support and hip rise prior to trunk extension with notable pain behaviors ? ?GAIT: ?Comments: with no AD  today, decreased trunk rotation and arm swing, decreased terminal knee extension at terminal swing LLE, decreased heel strike at initial contact LLE.  ? ? ? ?TODAY'S TREATMENT  ? ? ?Therapeutic Exercise - for HEP es

## 2021-10-26 ENCOUNTER — Encounter: Payer: 59 | Admitting: Physical Therapy

## 2021-10-31 ENCOUNTER — Inpatient Hospital Stay
Admission: EM | Admit: 2021-10-31 | Discharge: 2021-11-12 | DRG: 041 | Disposition: A | Discharge status: Home Health | Payer: 59 | Attending: Osteopathic Medicine | Admitting: Osteopathic Medicine

## 2021-10-31 ENCOUNTER — Other Ambulatory Visit: Payer: Self-pay

## 2021-10-31 ENCOUNTER — Ambulatory Visit: Payer: 59 | Admitting: Internal Medicine

## 2021-10-31 ENCOUNTER — Ambulatory Visit: Payer: 59 | Admitting: Physical Therapy

## 2021-10-31 ENCOUNTER — Emergency Department: Payer: 59

## 2021-10-31 ENCOUNTER — Telehealth: Payer: Self-pay

## 2021-10-31 VITALS — BP 133/80 | HR 71 | Temp 97.9°F | Resp 16 | Ht 65.98 in | Wt 181.0 lb

## 2021-10-31 DIAGNOSIS — M549 Dorsalgia, unspecified: Secondary | ICD-10-CM | POA: Diagnosis not present

## 2021-10-31 DIAGNOSIS — Z79899 Other long term (current) drug therapy: Secondary | ICD-10-CM

## 2021-10-31 DIAGNOSIS — M009 Pyogenic arthritis, unspecified: Secondary | ICD-10-CM

## 2021-10-31 DIAGNOSIS — Z85828 Personal history of other malignant neoplasm of skin: Secondary | ICD-10-CM

## 2021-10-31 DIAGNOSIS — M4656 Other infective spondylopathies, lumbar region: Secondary | ICD-10-CM | POA: Diagnosis not present

## 2021-10-31 DIAGNOSIS — D649 Anemia, unspecified: Secondary | ICD-10-CM | POA: Diagnosis present

## 2021-10-31 DIAGNOSIS — F32A Depression, unspecified: Secondary | ICD-10-CM | POA: Diagnosis present

## 2021-10-31 DIAGNOSIS — Z8614 Personal history of Methicillin resistant Staphylococcus aureus infection: Secondary | ICD-10-CM | POA: Diagnosis not present

## 2021-10-31 DIAGNOSIS — M869 Osteomyelitis, unspecified: Secondary | ICD-10-CM

## 2021-10-31 DIAGNOSIS — R7881 Bacteremia: Secondary | ICD-10-CM | POA: Diagnosis not present

## 2021-10-31 DIAGNOSIS — M130 Polyarthritis, unspecified: Secondary | ICD-10-CM | POA: Diagnosis present

## 2021-10-31 DIAGNOSIS — M4627 Osteomyelitis of vertebra, lumbosacral region: Secondary | ICD-10-CM | POA: Diagnosis present

## 2021-10-31 DIAGNOSIS — B9562 Methicillin resistant Staphylococcus aureus infection as the cause of diseases classified elsewhere: Secondary | ICD-10-CM | POA: Diagnosis not present

## 2021-10-31 DIAGNOSIS — I951 Orthostatic hypotension: Secondary | ICD-10-CM | POA: Diagnosis not present

## 2021-10-31 DIAGNOSIS — K5900 Constipation, unspecified: Secondary | ICD-10-CM | POA: Diagnosis not present

## 2021-10-31 DIAGNOSIS — M4620 Osteomyelitis of vertebra, site unspecified: Secondary | ICD-10-CM

## 2021-10-31 DIAGNOSIS — G061 Intraspinal abscess and granuloma: Principal | ICD-10-CM | POA: Diagnosis present

## 2021-10-31 DIAGNOSIS — L02416 Cutaneous abscess of left lower limb: Secondary | ICD-10-CM | POA: Diagnosis not present

## 2021-10-31 DIAGNOSIS — M4640 Discitis, unspecified, site unspecified: Secondary | ICD-10-CM | POA: Diagnosis present

## 2021-10-31 DIAGNOSIS — D509 Iron deficiency anemia, unspecified: Secondary | ICD-10-CM | POA: Diagnosis not present

## 2021-10-31 DIAGNOSIS — Z9049 Acquired absence of other specified parts of digestive tract: Secondary | ICD-10-CM | POA: Diagnosis not present

## 2021-10-31 DIAGNOSIS — L0592 Pilonidal sinus without abscess: Secondary | ICD-10-CM | POA: Diagnosis present

## 2021-10-31 DIAGNOSIS — G062 Extradural and subdural abscess, unspecified: Secondary | ICD-10-CM

## 2021-10-31 DIAGNOSIS — L02415 Cutaneous abscess of right lower limb: Secondary | ICD-10-CM | POA: Diagnosis present

## 2021-10-31 DIAGNOSIS — M4647 Discitis, unspecified, lumbosacral region: Secondary | ICD-10-CM

## 2021-10-31 LAB — CBC WITH DIFFERENTIAL/PLATELET
Abs Immature Granulocytes: 0.05 10*3/uL (ref 0.00–0.07)
Basophils Absolute: 0 10*3/uL (ref 0.0–0.1)
Basophils Relative: 0 %
Eosinophils Absolute: 0.2 10*3/uL (ref 0.0–0.5)
Eosinophils Relative: 2 %
HCT: 33.6 % — ABNORMAL LOW (ref 36.0–46.0)
Hemoglobin: 10.1 g/dL — ABNORMAL LOW (ref 12.0–15.0)
Immature Granulocytes: 1 %
Lymphocytes Relative: 16 %
Lymphs Abs: 1.2 10*3/uL (ref 0.7–4.0)
MCH: 26.4 pg (ref 26.0–34.0)
MCHC: 30.1 g/dL (ref 30.0–36.0)
MCV: 88 fL (ref 80.0–100.0)
Monocytes Absolute: 0.6 10*3/uL (ref 0.1–1.0)
Monocytes Relative: 8 %
Neutro Abs: 5.5 10*3/uL (ref 1.7–7.7)
Neutrophils Relative %: 73 %
Platelets: 377 10*3/uL (ref 150–400)
RBC: 3.82 MIL/uL — ABNORMAL LOW (ref 3.87–5.11)
RDW: 14.4 % (ref 11.5–15.5)
WBC: 7.6 10*3/uL (ref 4.0–10.5)
nRBC: 0 % (ref 0.0–0.2)

## 2021-10-31 LAB — COMPREHENSIVE METABOLIC PANEL
ALT: 13 U/L (ref 0–44)
AST: 19 U/L (ref 15–41)
Albumin: 3.7 g/dL (ref 3.5–5.0)
Alkaline Phosphatase: 70 U/L (ref 38–126)
Anion gap: 11 (ref 5–15)
BUN: 19 mg/dL (ref 6–20)
CO2: 24 mmol/L (ref 22–32)
Calcium: 9.3 mg/dL (ref 8.9–10.3)
Chloride: 103 mmol/L (ref 98–111)
Creatinine, Ser: 0.72 mg/dL (ref 0.44–1.00)
GFR, Estimated: >60 mL/min (ref >60)
Glucose, Bld: 90 mg/dL (ref 70–99)
Potassium: 4.1 mmol/L (ref 3.5–5.1)
Sodium: 138 mmol/L (ref 135–145)
Total Bilirubin: 0.5 mg/dL (ref 0.3–1.2)
Total Protein: 8.7 g/dL — ABNORMAL HIGH (ref 6.5–8.1)

## 2021-10-31 LAB — URINALYSIS, ROUTINE W REFLEX MICROSCOPIC
Bacteria, UA: NONE SEEN
Bilirubin Urine: NEGATIVE
Glucose, UA: NEGATIVE mg/dL
Ketones, ur: NEGATIVE mg/dL
Leukocytes,Ua: NEGATIVE
Nitrite: NEGATIVE
Protein, ur: NEGATIVE mg/dL
Specific Gravity, Urine: 1.008 (ref 1.005–1.030)
pH: 5 (ref 5.0–8.0)

## 2021-10-31 IMAGING — MR MR THORACIC SPINE WO/W CM
7 of 9 series · 29 of 48 positions shown · IV contrast (gadavist)
Comparison: MRI thoracic spine [DATE], MRI lumbar spine
[DATE]

CLINICAL DATA: Worsening back pain, recent epidural abscess; low
back pain, recent thoracic epidural abscess, worsening low back pain

EXAM:
MRI THORACIC AND LUMBAR SPINE WITHOUT AND WITH CONTRAST
TECHNIQUE: Multiplanar and multiecho pulse sequences of the thoracic and lumbar
spine were obtained without and with intravenous contrast.
CONTRAST:  8mL GADAVIST GADOBUTROL 1 MMOL/ML IV SOLN

[Series 16: T1 · sagittal · 5.0mm · 1.88mm/px · 1 of 9 slices shown (1 of 3)]
[im 1/9]
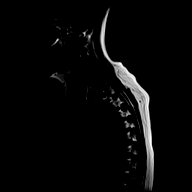

[Series 17: T2 · sagittal · 3.0mm · 1.06mm/px · 4 of 19 slices shown (1 of 2)]
[im 1/19]
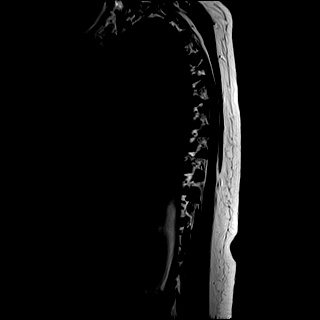
[im 7/19]
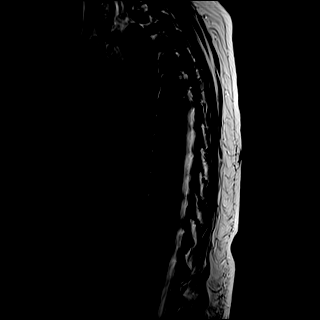
[im 13/19]
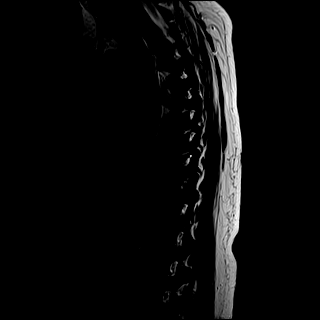
[im 19/19]
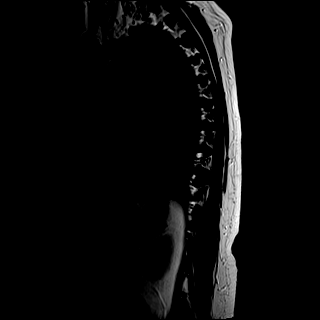

[Series 18: T1 · sagittal · 3.0mm · 1.06mm/px · 4 of 19 slices shown (2 of 3)]
[im 1/19]
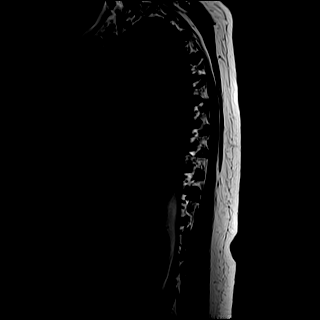
[im 7/19]
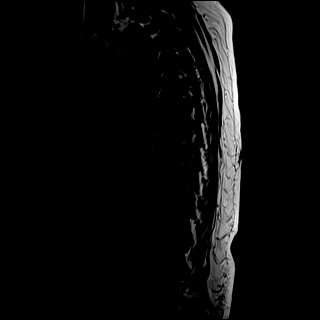
[im 13/19]
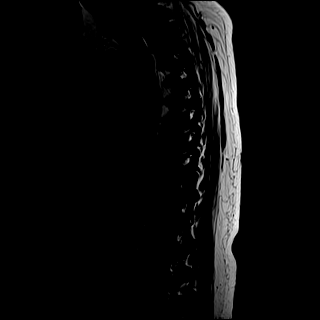
[im 19/19]
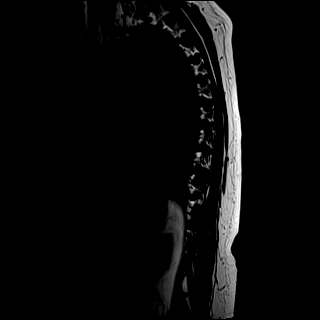

[Series 19: STIR · sagittal · 3.0mm · 0.53mm/px · 1 of 19 slices shown]
[im 1/19]
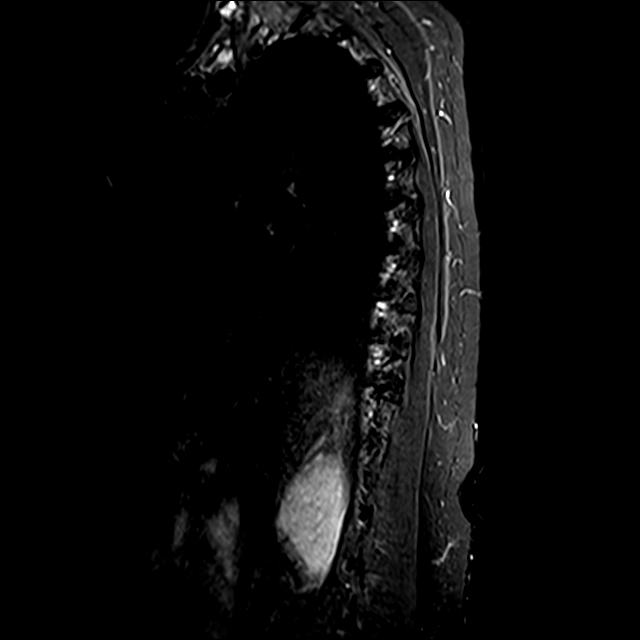

[Series 20: T2 · axial · 4.0mm · 0.59mm/px · z∈[-329,-102]mm · 8 of 39 slices shown (2 of 2)]
[im 1/39]
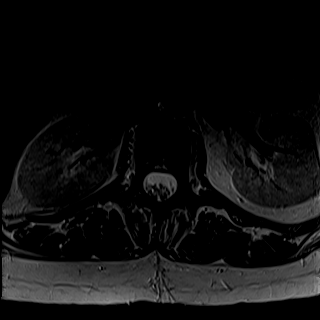
[im 6/39]
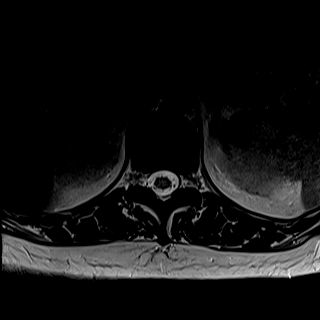
[im 11/39]
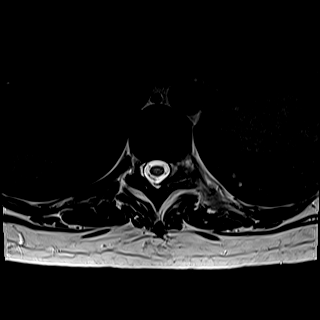
[im 17/39]
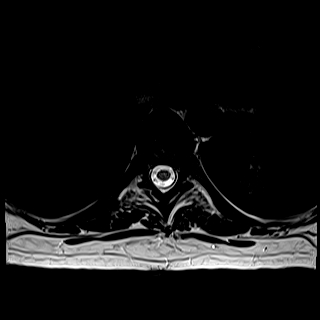
[im 22/39]
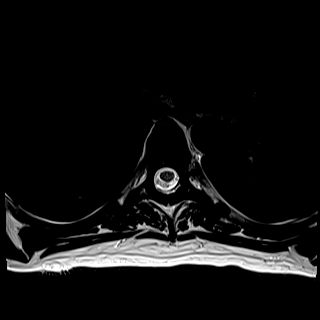
[im 28/39]
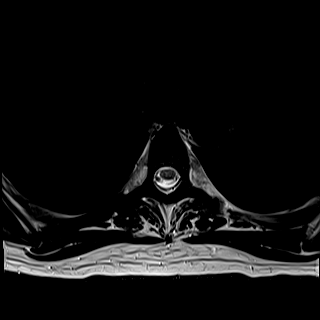
[im 33/39]
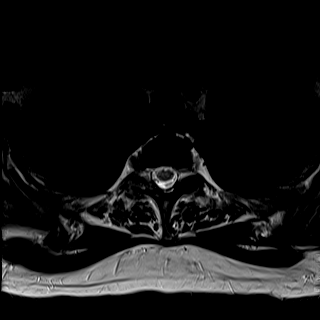
[im 39/39]
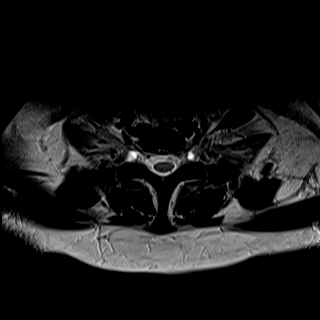

[Series 22: T1 · axial · non-contrast · 4.0mm · 0.37mm/px · z∈[-329,-102]mm · 8 of 39 slices shown (3 of 3)]
[im 1/39]
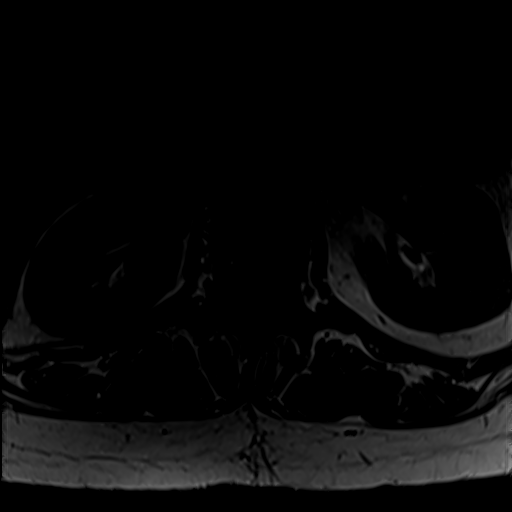
[im 6/39]
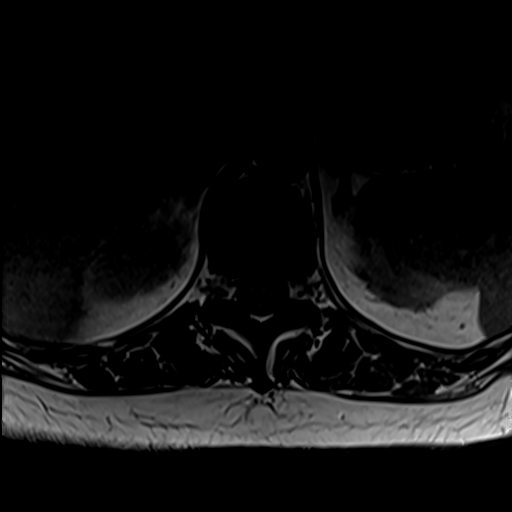
[im 11/39]
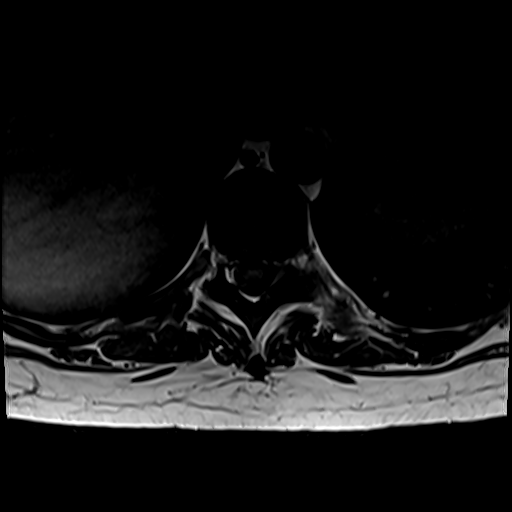
[im 17/39]
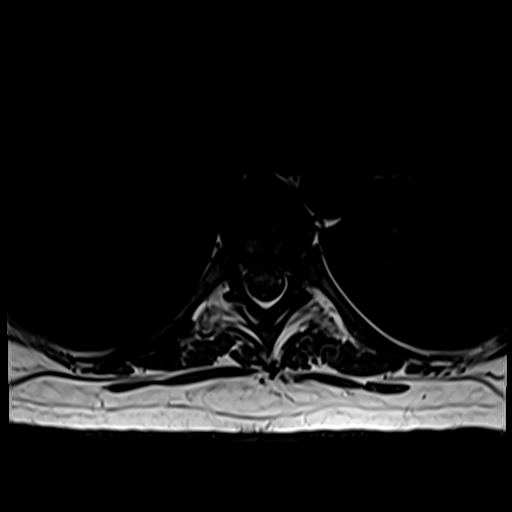
[im 22/39]
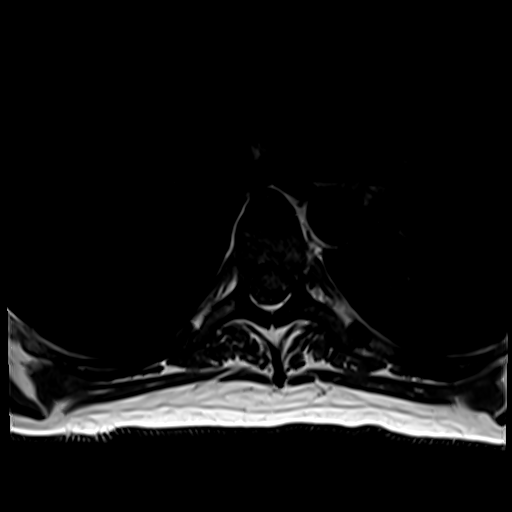
[im 28/39]
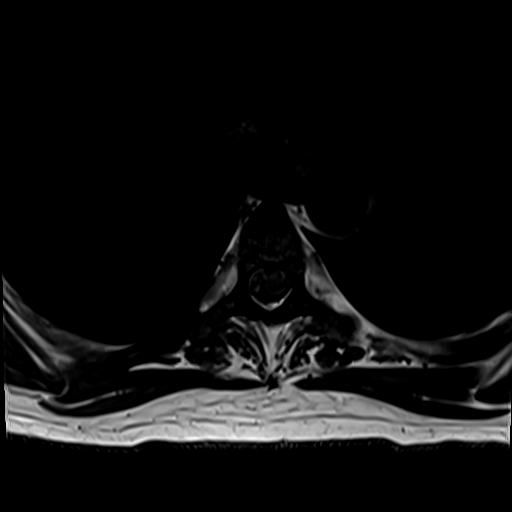
[im 33/39]
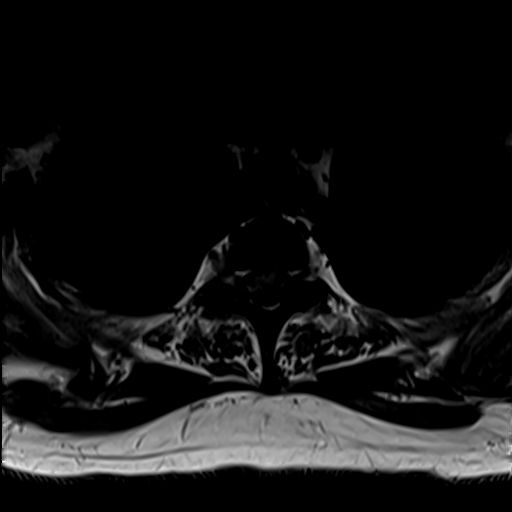
[im 39/39]
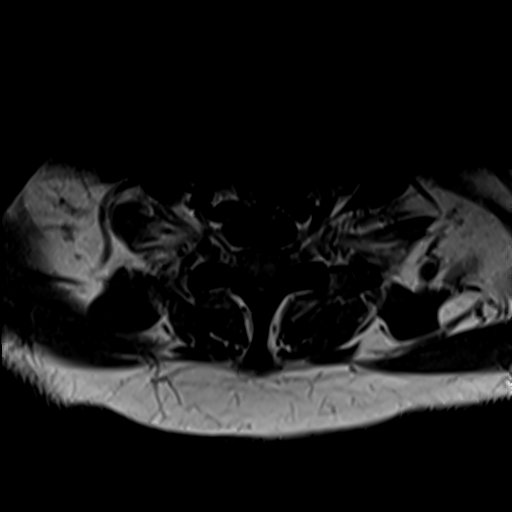

[Series 29: T1 fat-sat post-contrast · sagittal · 3.0mm · 1.06mm/px · 3 of 17 slices shown]
[im 1/17]
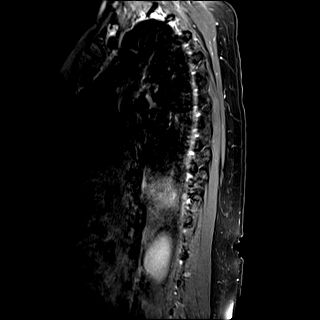
[im 9/17]
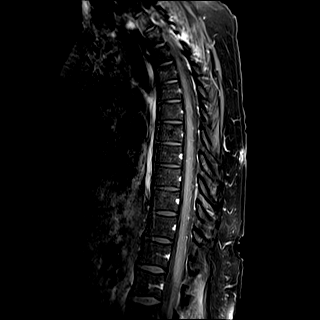
[im 17/17]
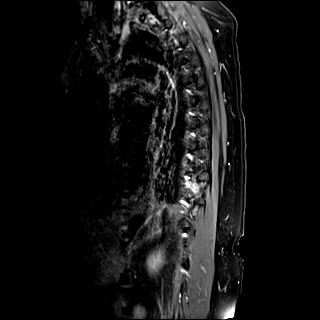

[29 of 48 positions shown; findings below may reference images not displayed]

FINDINGS: MRI THORACIC SPINE FINDINGS

Alignment:  Physiologic.

Vertebrae: Minimal persistent periarticular marrow edema along the
right T2-T3 facet with persistent mild facet effusion and adjacent
soft tissue enhancement. Generalized low T1 marrow signal.

Cord: No abnormal cord signal. Decreased mass effect on the upper
thoracic spine thecal sac.

Paraspinal and other soft tissues: Adjacent soft tissue edema and
enhancement on the right adjacent to the T2-T3 facet, decreased from
the prior exam.

Disc levels: Decreased dorsal epidural phlegmon of the upper
thoracic spine, now measuring 2 mm in thickness, previously up to 5
mm, extending from the level of T2-T3 superiorly beyond the field of
view into the cervical spine.

MRI LUMBAR SPINE FINDINGS

Segmentation:  Standard.

Alignment:  Physiologic.

Vertebrae: There is an in T2 hyperintense L5-S1 intervertebral disc
with marrow edema and enhancement of the L5 vertebral body, inferior
endplate irregularity, and mild enhancement of the superior endplate
of S1. Left-sided facet effusion at L2-L3 with adjacent mild marrow
edema of the inferior articular process of L2.

Conus medullaris and cauda equina: Conus extends to the L1 level.
Conus and cauda equina appear normal.

Paraspinal and other soft tissues: There is paraspinal soft tissue
edema centered at L5-S1. See below for description of paraspinal
changes at L2-L3.

Disc levels: Left-sided facet effusion at L2-L3 with adjacent soft
tissue edema and enhancement. There is soft tissue
phlegmon/developing abscess in the left paraspinal soft tissues
measuring 1.7 x 1.0 cm (sagittal stir image 11). These paraspinal
changes were present but less conspicuous on the prior exam given
lack of intravenous contrast at that time.

At L5-S1, there is a T2 hyperintense disc with disc height loss, and
eccentric left disc bulging. There is a heterogeneous, enhancing
collection along the left aspect of the thecal sac extending from
the ventral to dorsal aspect, which tracks inferiorly along the left
S1 nerve root (axial T2 image 34, axial postcontrast image 34, axial
to prior exam in [DATE] x 1.7 cm in the axial
dimension at the level of L5-S1, previously 1.7 x 1.7 cm (axial
postcontrast image 34).
IMPRESSION: Discitis-osteomyelitis at L5-S1 with epidural phlegmon along the
left aspect of the thecal sac extending from the ventral to dorsal
aspect, unchanged in size from prior. Phlegmon tracks along the
descending left S1 nerve root. Adjacent paraspinal soft tissue
inflammation.

Septic left L2-L3 facet arthritis with adjacent marrow edema in the
inferior articular process of L2, adjacent left-sided paraspinal
soft tissue inflammation with focal phlegmon/developing abscess
measuring 1.7 x 1.0 cm.

## 2021-10-31 IMAGING — MR MR LUMBAR SPINE WO/W CM
6 of 7 series · 31 of 48 positions shown · IV contrast (gadavist)
Comparison: MRI thoracic spine [DATE], MRI lumbar spine
[DATE]

CLINICAL DATA: Worsening back pain, recent epidural abscess; low
back pain, recent thoracic epidural abscess, worsening low back pain

EXAM:
MRI THORACIC AND LUMBAR SPINE WITHOUT AND WITH CONTRAST
TECHNIQUE: Multiplanar and multiecho pulse sequences of the thoracic and lumbar
spine were obtained without and with intravenous contrast.
CONTRAST:  8mL GADAVIST GADOBUTROL 1 MMOL/ML IV SOLN

[Series 23: T2 · sagittal · 4.0mm · 0.81mm/px · 5 of 17 slices shown (1 of 2)]
[im 1/17]
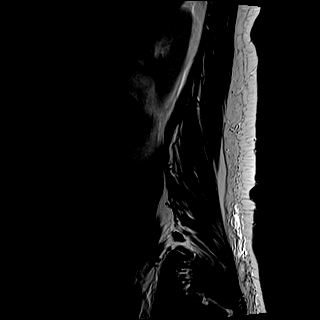
[im 5/17]
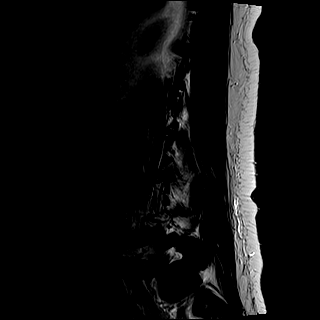
[im 9/17]
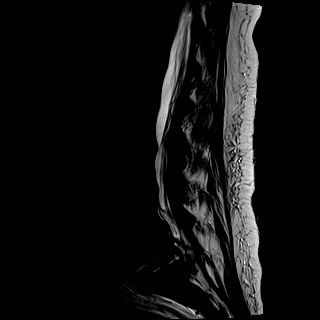
[im 13/17]
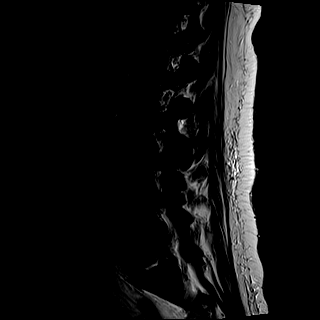
[im 17/17]
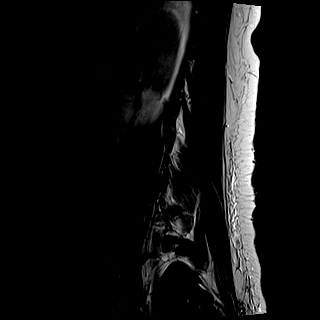

[Series 24: T1 · sagittal · 4.0mm · 0.81mm/px · 5 of 17 slices shown (1 of 2)]
[im 1/17]
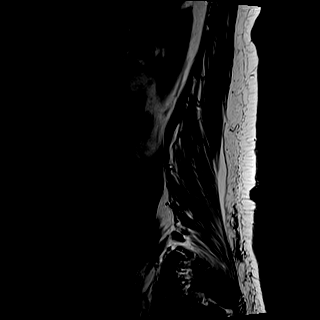
[im 5/17]
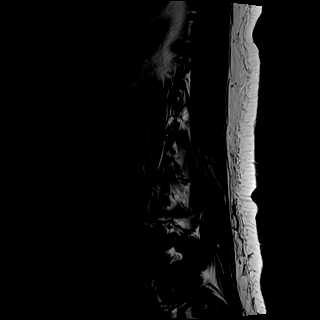
[im 9/17]
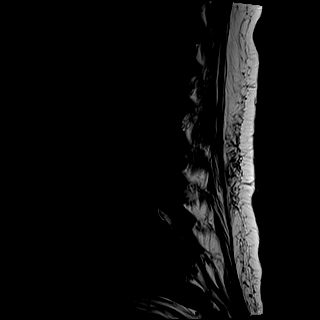
[im 13/17]
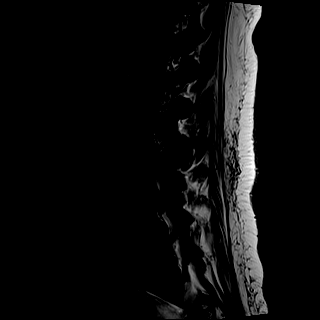
[im 17/17]
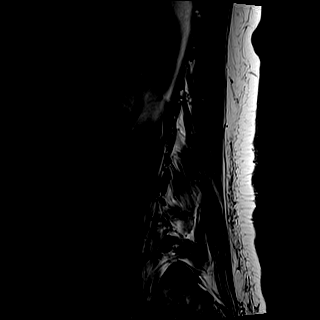

[Series 25: STIR · sagittal · 4.0mm · 0.41mm/px · 1 of 17 slices shown]
[im 1/17]
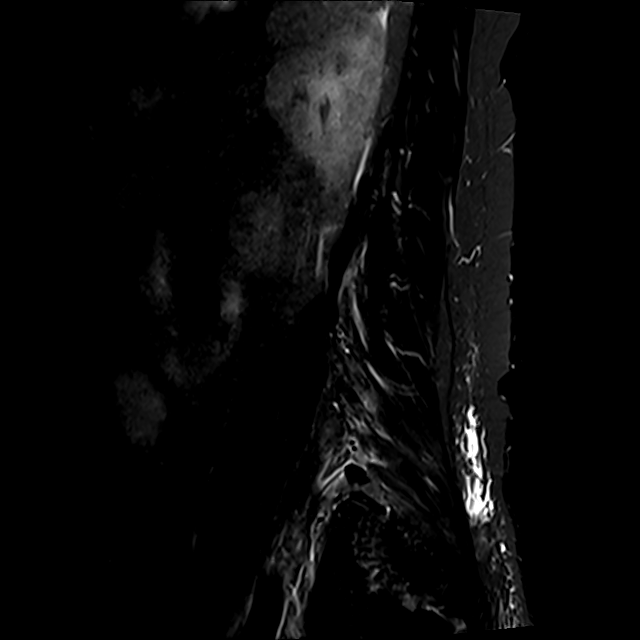

[Series 26: T2 · axial · 4.0mm · 0.78mm/px · z∈[-533,-321]mm · 8 of 38 slices shown (2 of 2)]
[im 1/38]
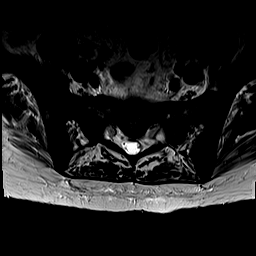
[im 5/38]
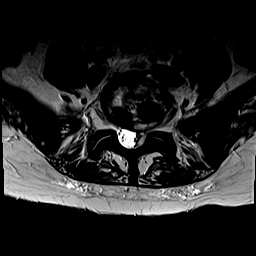
[im 13/38]
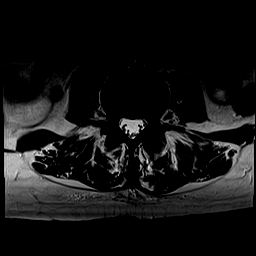
[im 17/38]
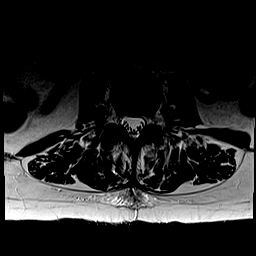
[im 21/38]
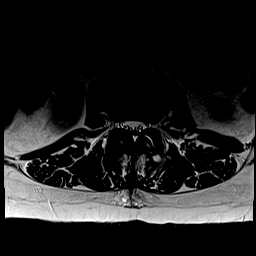
[im 25/38]
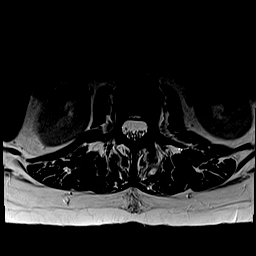
[im 33/38]
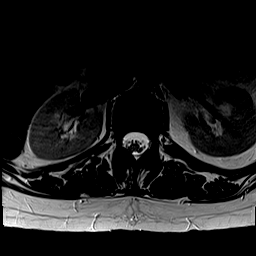
[im 38/38]
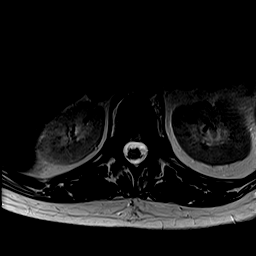

[Series 27: T1 · axial · 4.0mm · 0.39mm/px · z∈[-533,-321]mm · 8 of 38 slices shown (2 of 2)]
[im 1/38]
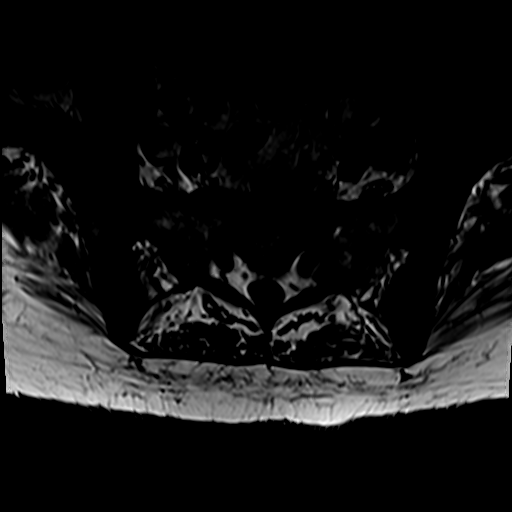
[im 5/38]
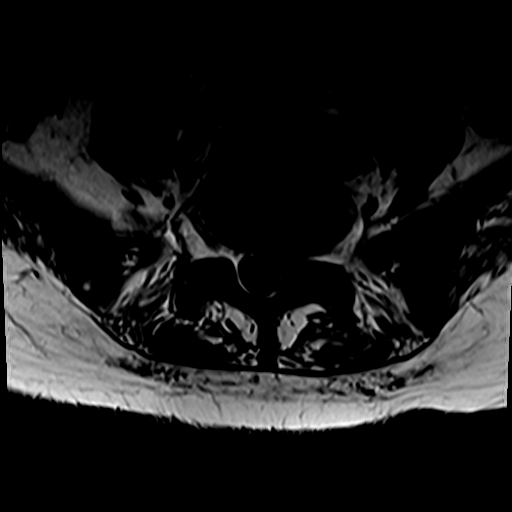
[im 13/38]
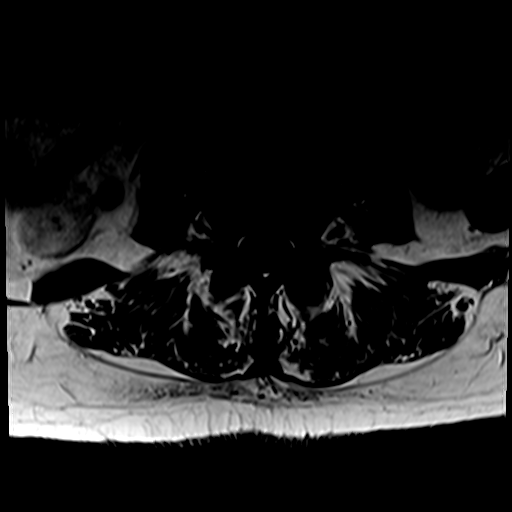
[im 17/38]
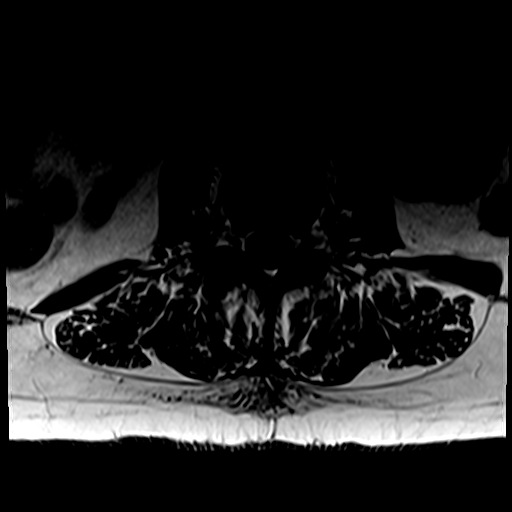
[im 21/38]
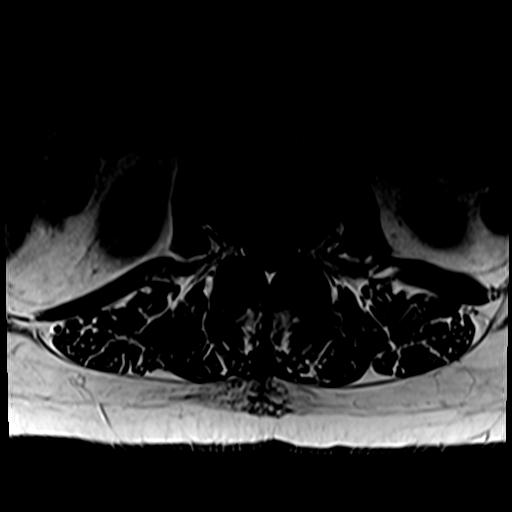
[im 25/38]
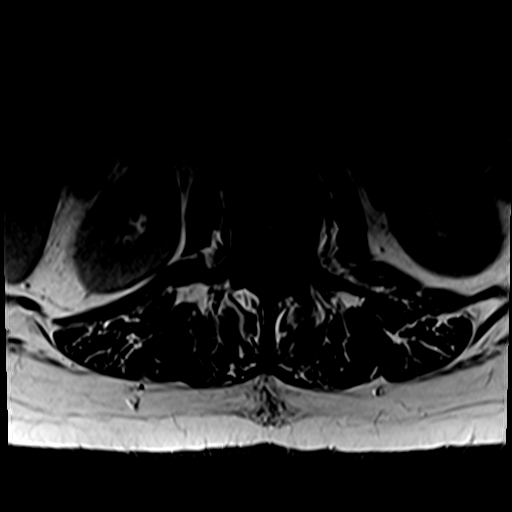
[im 33/38]
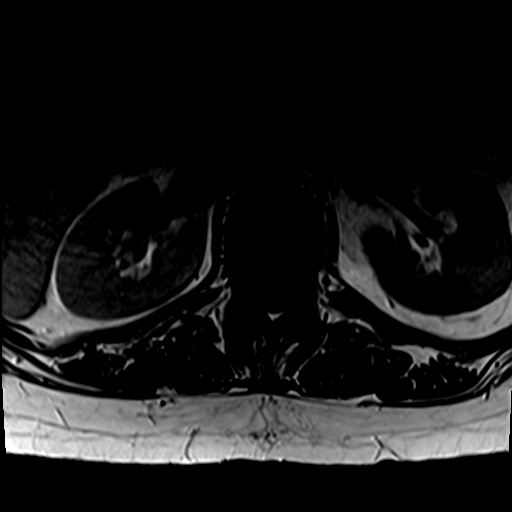
[im 38/38]
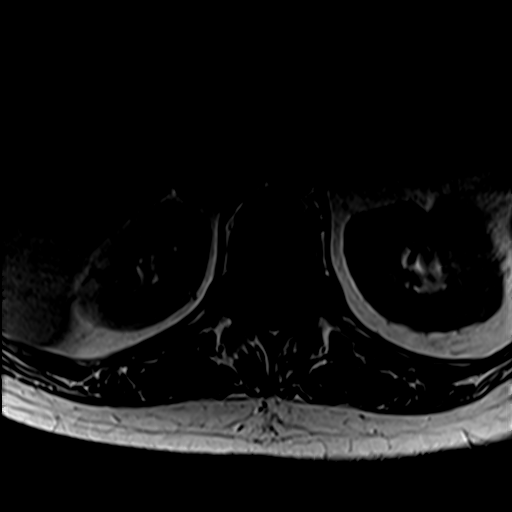

[Series 30: T1 fat-sat post-contrast · sagittal · 4.0mm · 0.81mm/px · 4 of 17 slices shown]
[im 1/17]
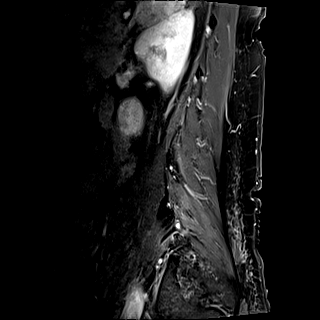
[im 6/17]
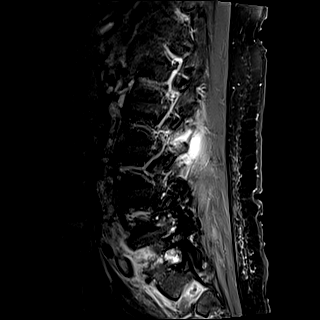
[im 11/17]
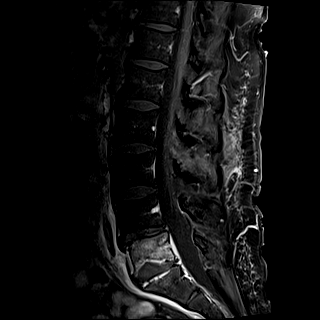
[im 17/17]
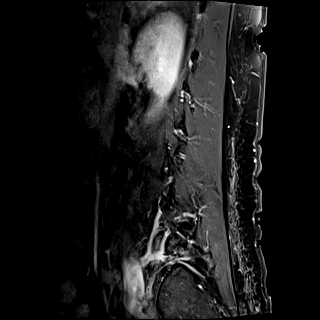

[31 of 48 positions shown; findings below may reference images not displayed]

FINDINGS: MRI THORACIC SPINE FINDINGS

Alignment:  Physiologic.

Vertebrae: Minimal persistent periarticular marrow edema along the
right T2-T3 facet with persistent mild facet effusion and adjacent
soft tissue enhancement. Generalized low T1 marrow signal.

Cord: No abnormal cord signal. Decreased mass effect on the upper
thoracic spine thecal sac.

Paraspinal and other soft tissues: Adjacent soft tissue edema and
enhancement on the right adjacent to the T2-T3 facet, decreased from
the prior exam.

Disc levels: Decreased dorsal epidural phlegmon of the upper
thoracic spine, now measuring 2 mm in thickness, previously up to 5
mm, extending from the level of T2-T3 superiorly beyond the field of
view into the cervical spine.

MRI LUMBAR SPINE FINDINGS

Segmentation:  Standard.

Alignment:  Physiologic.

Vertebrae: There is an in T2 hyperintense L5-S1 intervertebral disc
with marrow edema and enhancement of the L5 vertebral body, inferior
endplate irregularity, and mild enhancement of the superior endplate
of S1. Left-sided facet effusion at L2-L3 with adjacent mild marrow
edema of the inferior articular process of L2.

Conus medullaris and cauda equina: Conus extends to the L1 level.
Conus and cauda equina appear normal.

Paraspinal and other soft tissues: There is paraspinal soft tissue
edema centered at L5-S1. See below for description of paraspinal
changes at L2-L3.

Disc levels: Left-sided facet effusion at L2-L3 with adjacent soft
tissue edema and enhancement. There is soft tissue
phlegmon/developing abscess in the left paraspinal soft tissues
measuring 1.7 x 1.0 cm (sagittal stir image 11). These paraspinal
changes were present but less conspicuous on the prior exam given
lack of intravenous contrast at that time.

At L5-S1, there is a T2 hyperintense disc with disc height loss, and
eccentric left disc bulging. There is a heterogeneous, enhancing
collection along the left aspect of the thecal sac extending from
the ventral to dorsal aspect, which tracks inferiorly along the left
S1 nerve root (axial T2 image 34, axial postcontrast image 34, axial
to prior exam in [DATE] x 1.7 cm in the axial
dimension at the level of L5-S1, previously 1.7 x 1.7 cm (axial
postcontrast image 34).
IMPRESSION: Discitis-osteomyelitis at L5-S1 with epidural phlegmon along the
left aspect of the thecal sac extending from the ventral to dorsal
aspect, unchanged in size from prior. Phlegmon tracks along the
descending left S1 nerve root. Adjacent paraspinal soft tissue
inflammation.

Septic left L2-L3 facet arthritis with adjacent marrow edema in the
inferior articular process of L2, adjacent left-sided paraspinal
soft tissue inflammation with focal phlegmon/developing abscess
measuring 1.7 x 1.0 cm.

## 2021-10-31 MED ORDER — SODIUM CHLORIDE 0.9 % IV BOLUS
500.0000 mL | Freq: Once | INTRAVENOUS | Status: AC
  Administered 2021-10-31: 500 mL via INTRAVENOUS

## 2021-10-31 MED ORDER — ONDANSETRON HCL 4 MG/2ML IJ SOLN
4.0000 mg | Freq: Once | INTRAMUSCULAR | Status: AC
Start: 2021-10-31 — End: 2021-10-31
  Administered 2021-10-31: 4 mg via INTRAVENOUS
  Filled 2021-10-31: qty 2

## 2021-10-31 MED ORDER — MORPHINE SULFATE (PF) 4 MG/ML IV SOLN
4.0000 mg | Freq: Once | INTRAVENOUS | Status: AC
  Administered 2021-10-31: 4 mg via INTRAVENOUS
  Filled 2021-10-31: qty 1

## 2021-10-31 MED ORDER — GADOBUTROL 1 MMOL/ML IV SOLN
8.0000 mL | Freq: Once | INTRAVENOUS | Status: AC | PRN
  Administered 2021-10-31: 8 mL via INTRAVENOUS
  Filled 2021-10-31: qty 8

## 2021-10-31 MED ORDER — VANCOMYCIN HCL IN DEXTROSE 1-5 GM/200ML-% IV SOLN
1000.0000 mg | Freq: Once | INTRAVENOUS | Status: AC
  Administered 2021-11-01: 1000 mg via INTRAVENOUS
  Filled 2021-10-31: qty 200

## 2021-10-31 MED ORDER — ORPHENADRINE CITRATE 30 MG/ML IJ SOLN
60.0000 mg | Freq: Once | INTRAMUSCULAR | Status: AC
  Administered 2021-10-31: 60 mg via INTRAVENOUS
  Filled 2021-10-31: qty 2

## 2021-10-31 MED ORDER — HYDROMORPHONE HCL 1 MG/ML IJ SOLN
1.0000 mg | Freq: Once | INTRAMUSCULAR | Status: AC
  Administered 2021-11-01: 1 mg via INTRAVENOUS
  Filled 2021-10-31: qty 1

## 2021-10-31 MED ORDER — CYCLOBENZAPRINE HCL 10 MG PO TABS: 10.0000 mg | ORAL_TABLET | Freq: Three times a day (TID) | ORAL | 0 refills | Status: AC | PRN

## 2021-10-31 NOTE — ED Notes (Signed)
Iv meds given.  Family with pt.  

## 2021-10-31 NOTE — ED Notes (Signed)
Pt has a picc line in right upper arm.  Per verbal order from jonathan c. Pa-c, use the picc line for iv fluids and medicines.   ?

## 2021-10-31 NOTE — Patient Instructions (Addendum)
Your current labs: ?10/24/2021 ?Cbc wbc/hemoglobin/platelet = 8/9/460 (slight inflammatory component still, but improving; 4/03 platelet 500) ?Chemistry bun/cr = 11/0.72 ?Cpk = 21 (normal muscle enzyme level -- we check while you take daptomycin for side effect) ? ?10/16/2021 ?Crp = 54 (another marker for inflammation) ? ?10/09/2021 ?Crp = 57 ? ? ?Continue to take nsaids/tylenol as needed for back pain. Warm/compress would be fine. I'll give you some muscle relaxant to take at night as well ? ? ?See me in around 2 weeks ? ?Ok to remove picc line on 5/1st ? ?See me on may 2nd at 11am ?

## 2021-10-31 NOTE — ED Notes (Signed)
Pt remains in MRI 

## 2021-10-31 NOTE — ED Notes (Signed)
Pt to MRI

## 2021-10-31 NOTE — ED Provider Notes (Signed)
? ?[facility] ?Provider Note ? ?Patient Contact: 6:20 PM (approximate) ? ? ?History  ? ?Back Pain ? ? ?HPI ? ?Jill Cox is a 46 y.o. female who presents the emergency department for complaint of worsening back pain.  Patient has a very complicated medical history with recent admission for bacteremia secondary to MRSA.  Patient states that roughly 6 weeks ago she developed some back pain that was attributed to increased yard work.  She had no concerning signs and symptoms initially, was seen in this department and diagnosed with a strain and discharged with medications.  Patient would subsequently see this department 2 more times and primary care for developing worsening symptoms to include bilateral knee pain, bilateral wrist pain, ankle pain worsening back pain, elevated white blood cell count.  Patient had cultures during one of her visits that returned positive for MRSA.  During her subsequent evaluations it was determined that patient had an epidural abscess in addition to septic joints of the bilateral wrist, bilateral knees and ankle.  She had washout/debridement of the septic joints though she did not have any neuro surgical intervention for the spinal issues.  Patient had a epidural abscess of the thoracic spine as well as findings consistent with osteomyelitis with forming epidural abscess in the lumbar spine in the L5 of S1 distribution.  Patient has been admitted for 3 weeks for work-up, surgical procedures, antibiotics and was discharged with a PICC line with ongoing daptomycin antibiotics.  She has been following with her infectious disease and saw infectious disease earlier today.  She has had improving labs, and most of her symptoms have completely resolved however she has been developing a worsening mid lower back pain over the past several days.  She is on infectious disease today, but there was no concern at that time that she had a return of infection given no fevers, no  worsening of her labs and no significant tenderness over the spine itself on physical exam.  Patient denies fevers, chills, URI symptoms, chest pain, shortness of breath, abdominal symptoms.  She has had slight pain running into the left hip but no true radicular symptoms down the left leg.  No bowel or bladder dysfunction, saddle anesthesia or paresthesias.  Patient is ambulatory at this time.  Denies any loss of strength ?  ? ? ?Physical Exam  ? ?Triage Vital Signs: ?ED Triage Vitals  ?Enc Vitals Group  ?   BP 10/31/21 1638 (!) 158/86  ?   Pulse Rate 10/31/21 1638 77  ?   Resp 10/31/21 1638 18  ?   Temp 10/31/21 1638 98 ?F (36.7 ?C)  ?   Temp Source 10/31/21 1638 Oral  ?   SpO2 10/31/21 1638 99 %  ?   Weight --   ?   Height --   ?   Head Circumference --   ?   Peak Flow --   ?   Pain Score 10/31/21 1641 9  ?   Pain Loc --   ?   Pain Edu? --   ?   Excl. in [place]? --   ? ? ?Most recent vital signs: ?Vitals:  ? 10/31/21 1929 10/31/21 2335  ?BP: (!) 145/76 136/70  ?Pulse: (!) 58 62  ?Resp: 17 18  ?Temp:  98.3 ?F (36.8 ?C)  ?SpO2: 100% 98%  ? ? ? ?General: Alert and in no acute distress.  ?Neck: No stridor. No cervical spine tenderness to palpation. ?Hematological/Lymphatic/Immunilogical: No cervical lymphadenopathy. ?Cardiovascular:  Good peripheral perfusion ?Respiratory: Normal respiratory effort without tachypnea or retractions. Lungs CTAB. Good air entry to the bases with no decreased or absent breath sounds. ?Gastrointestinal: Bowel sounds ?4 quadrants. Soft and nontender to palpation. No guarding or rigidity. No palpable masses. No distention. No CVA tenderness. ?Musculoskeletal: Full range of motion to all extremities.  Visualization of the lumbar spine reveals no infectious process identified superficially.  There is no erythema, edema along the spine itself.  She does have some tenderness in the L1-L4 distribution along the left paraspinal muscle groups.  No midline tenderness.  There is no extension into the SI  joints or sciatic notches.  Negative straight leg raise bilaterally.  Dorsalis pedis pulse and sensation intact and equal bilateral lower extremities.  Patient is ambulatory at this time with no limp.  Good range of motion to the thoracic and lumbar spine ?Neurologic:  No gross focal neurologic deficits are appreciated.  ?Skin:   No rash noted ?Other: ? ? ?ED Results / Procedures / Treatments  ? ?Labs ?(all labs ordered are listed, but only abnormal results are displayed) ?Labs Reviewed  ?CBC WITH DIFFERENTIAL/PLATELET - Abnormal; Notable for the following components:  ?    Result Value  ? RBC 3.82 (*)   ? Hemoglobin 10.1 (*)   ? HCT 33.6 (*)   ? All other components within normal limits  ?COMPREHENSIVE METABOLIC PANEL - Abnormal; Notable for the following components:  ? Total Protein 8.7 (*)   ? All other components within normal limits  ?URINALYSIS, ROUTINE W REFLEX MICROSCOPIC - Abnormal; Notable for the following components:  ? Color, Urine STRAW (*)   ? APPearance CLEAR (*)   ? Hgb urine dipstick MODERATE (*)   ? All other components within normal limits  ?SEDIMENTATION RATE - Abnormal; Notable for the following components:  ? Sed Rate 79 (*)   ? All other components within normal limits  ?CULTURE, BLOOD (ROUTINE X 2)  ?CULTURE, BLOOD (ROUTINE X 2)  ?MRSA NEXT GEN BY PCR, NASAL  ?C-REACTIVE PROTEIN  ?VITAMIN B12  ?FOLATE  ?IRON AND TIBC  ?FERRITIN  ?RETICULOCYTES  ?ANA COMPREHENSIVE PANEL  ?LYME DISEASE SEROLOGY W/REFLEX  ?URINALYSIS, COMPLETE (UACMP) WITH MICROSCOPIC  ?EPSTEIN-BARR VIRUS (EBV) ANTIBODY PROFILE  ?ROCKY MTN SPOTTED FVR ABS PNL(IGG+IGM)  ?RPR  ?EHRLICHIA ANTIBODY PANEL  ?ALDOLASE  ?CK  ?D-DIMER, QUANTITATIVE  ? ? ? ?EKG ? ? ? ? ?RADIOLOGY ? ?I personally viewed and evaluated these images as part of my medical decision making, as well as reviewing the written report by the radiologist. ? ?ED Provider Interpretation: Visualization of imaging of the MRI reveals findings consistent with previous  epidural abscess/infection in the thoracic and lower lumbar spine.  There is findings consistent with osteomyelitis in the L5-S1 region.  There is a new area in the L2-L3 distribution with left-sided septic arthritis with effusion and marrow edema.  There is also soft tissue phlegmon/newly developing abscess in this region in the left paraspinal soft tissue. ? ?MR THORACIC SPINE W WO CONTRAST ? ?Result Date: 10/31/2021 ?CLINICAL DATA:  Worsening back pain, recent epidural abscess; low back pain, recent thoracic epidural abscess, worsening low back pain EXAM: MRI THORACIC AND LUMBAR SPINE WITHOUT AND WITH CONTRAST TECHNIQUE: Multiplanar and multiecho pulse sequences of the thoracic and lumbar spine were obtained without and with intravenous contrast. CONTRAST:  51mL GADAVIST GADOBUTROL 1 MMOL/ML IV SOLN COMPARISON:  MRI thoracic spine 09/26/2021, MRI lumbar spine 09/28/2021 FINDINGS: MRI THORACIC SPINE FINDINGS Alignment:  Physiologic. Vertebrae:  Minimal persistent periarticular marrow edema along the right T2-T3 facet with persistent mild facet effusion and adjacent soft tissue enhancement. Generalized low T1 marrow signal. Cord: No abnormal cord signal. Decreased mass effect on the upper thoracic spine thecal sac. Paraspinal and other soft tissues: Adjacent soft tissue edema and enhancement on the right adjacent to the T2-T3 facet, decreased from the prior exam. Disc levels: Decreased dorsal epidural phlegmon of the upper thoracic spine, now measuring 2 mm in thickness, previously up to 5 mm, extending from the level of T2-T3 superiorly beyond the field of view into the cervical spine. MRI LUMBAR SPINE FINDINGS Segmentation:  Standard. Alignment:  Physiologic. Vertebrae: There is an in T2 hyperintense L5-S1 intervertebral disc with marrow edema and enhancement of the L5 vertebral body, inferior endplate irregularity, and mild enhancement of the superior endplate of S1. Left-sided facet effusion at L2-L3 with adjacent  mild marrow edema of the inferior articular process of L2. Conus medullaris and cauda equina: Conus extends to the L1 level. Conus and cauda equina appear normal. Paraspinal and other soft tissues: There

## 2021-10-31 NOTE — Progress Notes (Signed)
?  ? ? ? ? ?[facility] ? ?Patient Active Problem List  ? Diagnosis Date Noted  ? Debility 10/05/2021  ? Hypoalbuminemia: Severe 09/28/2021  ? Hypokalemia 09/28/2021  ? Constipation 09/28/2021  ? Anemia 09/28/2021  ? Edema due to hypervolemia 09/28/2021  ? Septic joint ([place]) 09/27/2021  ? 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 MRSA bacteremia 09/24/2021  ? Polyarthritis 09/24/2021  ? Severe sepsis ([place]) 09/24/2021  ? Hypercalcemia 09/24/2021  ? Depression 09/24/2021  ? Engages in [place] 09/24/2021  ? ? ? ? ?Subjective:  ? ? Patient ID: Jill Cox, female    DOB: 07/28/1975, 46 y.o.   MRN: [id number] ? ?Chief Complaint  ?Patient presents with  ? Follow-up  ?  Refills needed for Oxycodone and Flexeril as we are only provider treating at this time.   ? ? ?HPI: ? ?Jill Cox is a 46 y.o. female here for hospital follow up of disseminated mrsa infection but with acute issue of diarrhea ? ?Patient reported receiving an enema the day prior to release. Severe watery diarrhea the day of release continuing into post-discharge day #2.  ? ?Stool is more formed now, but Butt feels raw and painful ? ?She took 1 serving immodium 3 days prior to this visit only ? ?Yesterday 3 bowel movement. Today so far 1 bm ? ?Appetite hasn't "been the same since discharge." Last night ate well.  ? ?Hx pilonidal cyst/removed. "Sensitive when laying on my back a long time." Currently top of that area is numb, and nervous and worry if it'll come back ? ?She reports slight redness in the left wrist incision; the right calf and left thigh incision are without redness. No significant/new pain left wrist. No f/c ? ?Tolerating daptomycin -- no rash/chest pain/cough/dyspnea/myalgia ? ?10/31/21 id clinic f/u ?Reviewed opat labs ?Left lower back very painful. No fever/chill ?No n/v/diarrhea ?Sutures removed in all incision ?There is slight swelling in the left thigh but surgeon thinks seroma ? ? ?No Known Allergies ? ? ? ?Outpatient  Medications Prior to Visit  ?Medication Sig Dispense Refill  ? acetaminophen (TYLENOL) 325 MG tablet Take 2 tablets (650 mg total) by mouth every 6 (six) hours as needed for mild pain or fever.    ? cyclobenzaprine (FLEXERIL) 10 MG tablet Take 1 tablet (10 mg total) by mouth 3 (three) times daily as needed for muscle spasms. 21 tablet 0  ? daptomycin (CUBICIN) IVPB Inject 650 mg into the vein daily. Indication:  severe MRSA sepsis ?First Dose: Yes ?Last Day of Therapy:  11/13/21 ?Labs - Once weekly:  CBC/D, BMP, and CPK ?Labs - Every other week:  ESR and CRP ?Method of administration: IV Push ?Method of administration may be changed at the discretion of home infusion pharmacist based upon assessment of the patient and/or caregiver's ability to self-administer the medication ordered. 39 Units 0  ? DAPTOmycin in sodium chloride 0.9 % 50 mL Inject 650 mg into the vein.    ? lidocaine (XYLOCAINE) 5 % ointment Apply 1 application. topically 4 (four) times daily as needed. 35.44 g 1  ? oxyCODONE-acetaminophen (PERCOCET/ROXICET) 5-325 MG tablet Take 1 tablet by mouth every 4 (four) hours as needed for moderate pain. 30 tablet 0  ? potassium chloride SA (KLOR-CON M) 20 MEQ tablet Take 2 tablets (40 mEq total) by mouth daily. 6 tablet 0  ? sertraline (ZOLOFT) 25 MG tablet Take 25 mg by mouth every morning.    ? feeding supplement (ENSURE ENLIVE / ENSURE PLUS)  LIQD Take 237 mLs by mouth 2 (two) times daily between meals. 237 mL 12  ? ferrous sulfate 325 (65 FE) MG tablet Take 1 tablet (325 mg total) by mouth daily with breakfast. 30 tablet 1  ? ?No facility-administered medications prior to visit.  ? ? ? ?Social History  ? ?Socioeconomic History  ? Marital status: Married  ?  Spouse name: Not on file  ? Number of children: Not on file  ? Years of education: Not on file  ? Highest education level: Not on file  ?Occupational History  ? Not on file  ?Tobacco Use  ? Smoking status: Never  ? Smokeless tobacco: Never  ? Tobacco  comments:  ?  Pt is doing vaping  ?Substance and Sexual Activity  ? Alcohol use: Not Currently  ? Drug use: Never  ? Sexual activity: Not on file  ?Other Topics Concern  ? Not on file  ?Social History Narrative  ? Not on file  ? ?Social Determinants of Health  ? ?Financial Resource Strain: Not on file  ?Food Insecurity: Not on file  ?Transportation Needs: Not on file  ?Physical Activity: Not on file  ?Stress: Not on file  ?Social Connections: Not on file  ?Intimate Partner Violence: Not on file  ? ? ? ? ?Review of Systems ?   ?All other ros negative ? ?Objective:  ?  ?BP 133/80   Pulse 71   Temp 97.9 ?F (36.6 ?C)   Resp 16   Ht 5' 5.98" (1.676 m)   Wt 181 lb (82.1 kg)   LMP  (LMP Unknown)   SpO2 100%   BMI 29.23 kg/m?  ?Nursing note and vital signs reviewed. ? ?Physical Exam ? ?   ?General/constitutional: no distress, pleasant ?HEENT: Normocephalic, PER, Conj Clear, EOMI, Oropharynx clear ?Neck supple ?CV: rrr no mrg ?Lungs: clear to auscultation, normal respiratory effort ?Abd: Soft, Nontender ?Ext: no edema ?Skin/msk: no midline back tenderness; no soft tissue swelling/redness in paralumbar area; negative straight leg raise; left wrist incision healed; left thigh incision healed but soft fluctuance around incision no erythema/warmth; the right calf incision mostly closed and slight opening without purulence ? ? ?Central line presence: no erythema/purulence ? ? ? ? ? ? ? ? ?Labs: ?10/24/2021 ?Cbc wbc/hemoglobin/platelet = 8/9/460 (slight inflammatory component still, but improving; 4/03 platelet 500) ?Chemistry bun/cr = 11/0.72 ?Cpk = 21 (normal muscle enzyme level -- we check while you take daptomycin for side effect) ? ?10/16/2021 ?Crp = 54 (another marker for inflammation) ? ?10/09/2021 ?Crp = 57 ? ?Micro: ? ?Serology: ? ?Imaging: ? ?Assessment & Plan:  ? ?Problem List Items Addressed This Visit   ? ?  ? Other  ? MRSA bacteremia - Primary  ?46 yo female non-ivdu here for hospital f/u on mrsa septicemia, s/p  I&D left wrist/thigh and right calf. Tee negative for vegetation. On daptomycin monotherapy after about 1 week combination ceftaroline/dapto for persistent bacteremia in setting poor source control ? ? ?10/31/21 assessment ?Diarrhea (enema and abx associated). Improved. Irritation perirectal. No suspicion cdiff at this time.  ?-diarrhea had resolved ?-her pilonidal cyst also improved per her report ? ?Back pain ?Appears this is positional related rather than complication of infection ?Previous entire spine mri is without involvement ? ?-prn ice/warm pack nsaids/tylenol and flexeril ?-if worse will image ? ? ?Mrsa infection/bsi ?S/p left wrist, left thigh, and right calf I&D ?The wrist incision and thigh incision healed; there is slight fluctuance at the thigh appearing like a seroma. The  right calf incision without cellulitic change or purulence but 2 small area where wound haven't closed yet ?Crp and platelet coming down but still high ? ?Last planned day daptomycin for the 6 weeks is on 5/1 ? ?Will see her around that day and see if oral abx still needed ? ?Continue daptomycin for now until 5/1 and remove picc line on that date ? ? ?Follow-up: Return in about 2 weeks (around 11/14/2021). ? ? ? ? ? ?Jabier Mutton, MD ?[facility] ?[place] ?[phone]  pager   [phone] cell ?10/31/2021, 11:18 AM ? ?

## 2021-10-31 NOTE — H&P (Signed)
?History and Physical  ? ? ?Patient: Jill Cox QVZ:[id number] DOB: 1975-09-21 ?DOA: 10/31/2021 ?DOS: the patient was seen and examined on 11/01/2021 ?PCP: Bubba Camp, [place]  ?Patient coming from: Home ? ?Chief Complaint:  ?Chief Complaint  ?Patient presents with  ? Back Pain  ? ?HPI: Jill Cox is a 46 y.o. female with medical history significant of recent septic joints status post orthopedic and ID treatment with ongoing IV antibiotic regimen with daptomycin at home. ?Patient comes today for back pain, upper and lower back pain. ?Patient tells me her story and events from the beginning in March when she came back from her trip to [place] which she does every week.  They are fixing up at home as they are moving there.  Patient developed upper back pain.  And was seen by the emergency room and discharged with medications and diagnosis of back strain.  Patient realized that her back pain was not resolving in fact it was worsening and she went back and was started on steroid therapy.  Patient states she also developed a rash prior to the steroid therapy which was on her wrist which was a nonblanching erythematous circular well-defined maculopapular rash which she showed me on the phone pictures.  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  Patient had a stay of nearly 2 and half to 3 weeks for work-ups consults surgical procedures was discharged with a PICC line with daptomycin therapy.  Patient has been following up with her infectious  disease physician see she saw today.  Patient states that she started to develop pain worsening mid lower back pain over the past several days and she came to the emergency room today. ?Patient denies any history of vision complications or issues or loss, skin rashes other than the one that she has showed me, no joint swelling history in the past, patient does have a history of tick bites x2.  Patient has traveled to multiple trips internationally to [place] /  [place] being the last 1.  Patient has not had any miscarriages any epistaxis hemoptysis.  Patient has not had any birth controls.  On physical exam patient has a mark on her upper mid back that is about 2 inches wide and 1 inch tall that she reports was for removal of basal cell cancer that was done in September which I suspect is the culprit.  Differentials in this patient currently include bacteremia her skin cancer removal, autoimmune disorders. ? ?Review of Systems:  ?Review of Systems  ?Constitutional:  Positive for malaise/fatigue. Negative for chills, diaphoresis, fever and weight loss.  ?HENT: Negative.    ?Eyes: Negative.   ?Respiratory: Negative.    ?Cardiovascular: Negative.   ?Gastrointestinal: Negative.   ?Genitourinary: Negative.   ?Musculoskeletal:  Positive for back pain.  ?Skin: Negative.   ?Neurological: Negative.   ?Endo/Heme/Allergies: Negative.   ?Psychiatric/Behavioral:  Positive for depression.   ?All other systems reviewed and are negative. ? ?Past Medical History:  ?Diagnosis Date  ? Depression   ? Engages in [place]   ? PONV (  postoperative nausea and vomiting)   ? ?Past Surgical History:  ?Procedure Laterality Date  ? CHOLECYSTECTOMY    ? I & D EXTREMITY Left 09/30/2021  ? Procedure: IRRIGATION AND DEBRIDEMENT LEFT FOREARM ABSCESS;  Surgeon: Iran Planas, MD;  Location: [facility];  Service: Orthopedics;  Laterality: Left;  ? I & D EXTREMITY Bilateral 10/02/2021  ? Procedure: IRRIGATION AND DEBRIDEMENT EXTREMITY;  Surgeon: Altamese Hugo, MD;   Location: [facility];  Service: Orthopedics;  Laterality: Bilateral;  arthrocentsis bilateral knees and right ankle ?  ? TEE WITHOUT CARDIOVERSION N/A 09/26/2021  ? Procedure: TRANSESOPHAGEAL ECHOCARDIOGRAM (TEE);  Surgeon: Minna Merritts, MD;  Location: [facility];  Service: Cardiovascular;  Laterality: N/A;  ? ?Social History:  reports that she has never smoked. She has never used smokeless tobacco. She reports that she does not currently use alcohol. She reports that she does not use drugs. ? ?No Known Allergies ? ?Family History  ?Problem Relation Age of Onset  ? Heart disease Mother   ? Heart disease Father   ? ? ?Prior to Admission medications   ?Medication Sig Start Date End Date Taking? Authorizing Provider  ?acetaminophen (TYLENOL) 325 MG tablet Take 2 tablets (650 mg total) by mouth every 6 (six) hours as needed for mild pain or fever. 09/26/21   Lorella Nimrod, MD  ?cyclobenzaprine (FLEXERIL) 10 MG tablet Take 1 tablet (10 mg total) by mouth 3 (three) times daily as needed for up to 20 days for muscle spasms. 10/31/21 11/20/21  Vu, Johnny Bridge T, MD  ?daptomycin (CUBICIN) IVPB Inject 650 mg into the vein daily. Indication:  severe MRSA sepsis ?First Dose: Yes ?Last Day of Therapy:  11/13/21 ?Labs - Once weekly:  CBC/D, BMP, and CPK ?Labs - Every other week:  ESR and CRP ?Method of administration: IV Push ?Method of administration may be changed at the discretion of home infusion pharmacist based upon assessment of the patient and/or caregiver's ability to self-administer the medication ordered. 10/06/21 11/14/21  Shelly Coss, MD  ?DAPTOmycin in sodium chloride 0.9 % 50 mL Inject 650 mg into the vein. 10/06/21 11/14/21  [provider]  ?feeding supplement (ENSURE ENLIVE / ENSURE PLUS) LIQD Take 237 mLs by mouth 2 (two) times daily between meals. 10/06/21   Shelly Coss, MD  ?ferrous sulfate 325 (65 FE) MG tablet Take 1 tablet (325 mg total) by mouth daily with breakfast. 10/07/21   Shelly Coss, MD  ?lidocaine  (XYLOCAINE) 5 % ointment Apply 1 application. topically 4 (four) times daily as needed. 10/10/21   Vu, Johnny Bridge T, MD  ?oxyCODONE-acetaminophen (PERCOCET/ROXICET) 5-325 MG tablet Take 1 tablet by mouth every 4 (four) hours as needed for moderate pain. 10/06/21   Shelly Coss, MD  ?potassium chloride SA (KLOR-CON M) 20 MEQ tablet Take 2 tablets (40 mEq total) by mouth daily. 10/07/21   Shelly Coss, MD  ?sertraline (ZOLOFT) 25 MG tablet Take 25 mg by mouth every morning. 09/18/21   [provider]  ? ? ?Physical Exam: ?Vitals:  ? 10/31/21 1929 10/31/21 2335 11/01/21 0030 11/01/21 0120  ?BP: (!) 145/76 136/70 125/69 131/70  ?Pulse: (!) 58 62 66 67  ?Resp: 17 18  17   ?Temp:  98.3 ?F (36.8 ?C)  98.3 ?F (36.8 ?C)  ?TempSrc:  Oral    ?SpO2: 100% 98%  100%  ?Physical Exam ?Vitals and nursing note reviewed.  ?Constitutional:   ?   General: She is not in acute distress. ?   Appearance: Normal appearance. She is not ill-appearing,  toxic-appearing or diaphoretic.  ?HENT:  ?   Head: Normocephalic and atraumatic.  ?   Right Ear: Hearing and external ear normal.  ?   Left Ear: Hearing and external ear normal.  ?   Nose: Nose normal. No nasal deformity.  ?   Mouth/Throat:  ?   Lips: Pink.  ?   Mouth: Mucous membranes are moist.  ?   Tongue: No lesions.  ?   Pharynx: Oropharynx is clear.  ?Eyes:  ?   Extraocular Movements: Extraocular movements intact.  ?   Pupils: Pupils are equal, round, and reactive to light.  ?Neck:  ?   Vascular: No carotid bruit.  ?Cardiovascular:  ?   Rate and Rhythm: Normal rate and regular rhythm.  ?   Pulses: Normal pulses.  ?   Heart sounds: Normal heart sounds.  ?Pulmonary:  ?   Effort: Pulmonary effort is normal.  ?   Breath sounds: Normal breath sounds.  ? ? ?   Comments: Site of BCC removal in September 2022.  ?Abdominal:  ?   General: Bowel sounds are normal. There is no distension.  ?   Palpations: Abdomen is soft. There is no mass.  ?   Tenderness: There is no abdominal tenderness. There is  no guarding.  ?   Hernia: No hernia is present.  ?Musculoskeletal:  ?   Right lower leg: No edema.  ?   Left lower leg: No edema.  ?Skin: ?   General: Skin is warm.  ?Neurological:  ?   General: No foc

## 2021-10-31 NOTE — Telephone Encounter (Signed)
Advised patient per Dr Gale Journey we can not refill Oxycodone. She will need to contact provider who prescribed it.  ?Patient to STOP IV/PULL PICC 11/13/2021 will follow DR Vu 11/14/21.  ? ?I have advised [facility] of End Date for IV Abx: is 11/13/21 and I have advised [facility] Triage and [facility] teams.  ?

## 2021-10-31 NOTE — ED Triage Notes (Addendum)
Patient to ER via POV with complaints of mid to lower back pain that radiates into bilateral hips (primarily right), denies injury. Reports the pain started three weeks ago. Has been exercising and going to PT but has been unable to find relief. Has been taking oral pain medications every 2hours. Reports being very active and was marathon training prior to this. Denies urinary symptoms.  ? ?Recent d/c feb 24th for MRSA. Has PICC line in place.  ?

## 2021-11-01 ENCOUNTER — Inpatient Hospital Stay: Payer: 59

## 2021-11-01 ENCOUNTER — Encounter: Payer: Self-pay | Admitting: Internal Medicine

## 2021-11-01 DIAGNOSIS — K5900 Constipation, unspecified: Secondary | ICD-10-CM | POA: Diagnosis not present

## 2021-11-01 DIAGNOSIS — M009 Pyogenic arthritis, unspecified: Secondary | ICD-10-CM | POA: Diagnosis present

## 2021-11-01 DIAGNOSIS — D649 Anemia, unspecified: Secondary | ICD-10-CM | POA: Diagnosis not present

## 2021-11-01 DIAGNOSIS — L02416 Cutaneous abscess of left lower limb: Secondary | ICD-10-CM | POA: Diagnosis present

## 2021-11-01 DIAGNOSIS — D509 Iron deficiency anemia, unspecified: Secondary | ICD-10-CM | POA: Diagnosis not present

## 2021-11-01 DIAGNOSIS — G061 Intraspinal abscess and granuloma: Secondary | ICD-10-CM | POA: Diagnosis present

## 2021-11-01 DIAGNOSIS — M4640 Discitis, unspecified, site unspecified: Secondary | ICD-10-CM | POA: Diagnosis present

## 2021-11-01 DIAGNOSIS — L02419 Cutaneous abscess of limb, unspecified: Secondary | ICD-10-CM | POA: Diagnosis not present

## 2021-11-01 DIAGNOSIS — I951 Orthostatic hypotension: Secondary | ICD-10-CM | POA: Diagnosis present

## 2021-11-01 DIAGNOSIS — Z8614 Personal history of Methicillin resistant Staphylococcus aureus infection: Secondary | ICD-10-CM | POA: Diagnosis not present

## 2021-11-01 DIAGNOSIS — Z85828 Personal history of other malignant neoplasm of skin: Secondary | ICD-10-CM | POA: Diagnosis not present

## 2021-11-01 DIAGNOSIS — M4620 Osteomyelitis of vertebra, site unspecified: Secondary | ICD-10-CM | POA: Diagnosis not present

## 2021-11-01 DIAGNOSIS — B9562 Methicillin resistant Staphylococcus aureus infection as the cause of diseases classified elsewhere: Secondary | ICD-10-CM

## 2021-11-01 DIAGNOSIS — M869 Osteomyelitis, unspecified: Secondary | ICD-10-CM | POA: Diagnosis present

## 2021-11-01 DIAGNOSIS — L02415 Cutaneous abscess of right lower limb: Secondary | ICD-10-CM | POA: Diagnosis present

## 2021-11-01 DIAGNOSIS — M4656 Other infective spondylopathies, lumbar region: Secondary | ICD-10-CM | POA: Diagnosis present

## 2021-11-01 DIAGNOSIS — I361 Nonrheumatic tricuspid (valve) insufficiency: Secondary | ICD-10-CM | POA: Diagnosis not present

## 2021-11-01 DIAGNOSIS — R7881 Bacteremia: Secondary | ICD-10-CM | POA: Diagnosis not present

## 2021-11-01 DIAGNOSIS — F32A Depression, unspecified: Secondary | ICD-10-CM | POA: Diagnosis present

## 2021-11-01 DIAGNOSIS — M549 Dorsalgia, unspecified: Secondary | ICD-10-CM | POA: Diagnosis present

## 2021-11-01 DIAGNOSIS — Z9049 Acquired absence of other specified parts of digestive tract: Secondary | ICD-10-CM | POA: Diagnosis not present

## 2021-11-01 DIAGNOSIS — M4627 Osteomyelitis of vertebra, lumbosacral region: Secondary | ICD-10-CM | POA: Diagnosis present

## 2021-11-01 DIAGNOSIS — L0592 Pilonidal sinus without abscess: Secondary | ICD-10-CM | POA: Diagnosis present

## 2021-11-01 DIAGNOSIS — Z79899 Other long term (current) drug therapy: Secondary | ICD-10-CM | POA: Diagnosis not present

## 2021-11-01 DIAGNOSIS — M4647 Discitis, unspecified, lumbosacral region: Secondary | ICD-10-CM

## 2021-11-01 LAB — MRSA NEXT GEN BY PCR, NASAL: MRSA by PCR Next Gen: NOT DETECTED

## 2021-11-01 LAB — IRON AND TIBC
Iron: 24 ug/dL — ABNORMAL LOW (ref 28–170)
Saturation Ratios: 10 % — ABNORMAL LOW (ref 10.4–31.8)
TIBC: 253 ug/dL (ref 250–450)
UIBC: 229 ug/dL

## 2021-11-01 LAB — URINALYSIS, COMPLETE (UACMP) WITH MICROSCOPIC
Bacteria, UA: NONE SEEN
Bilirubin Urine: NEGATIVE
Glucose, UA: NEGATIVE mg/dL
Ketones, ur: NEGATIVE mg/dL
Leukocytes,Ua: NEGATIVE
Nitrite: NEGATIVE
Protein, ur: NEGATIVE mg/dL
Specific Gravity, Urine: 1.015 (ref 1.005–1.030)
pH: 5 (ref 5.0–8.0)

## 2021-11-01 LAB — RETICULOCYTES
Immature Retic Fract: 25.4 % — ABNORMAL HIGH (ref 2.3–15.9)
RBC.: 3.21 MIL/uL — ABNORMAL LOW (ref 3.87–5.11)
Retic Count, Absolute: 95 10*3/uL (ref 19.0–186.0)
Retic Ct Pct: 3 % (ref 0.4–3.1)

## 2021-11-01 LAB — COMPREHENSIVE METABOLIC PANEL
ALT: 12 U/L (ref 0–44)
AST: 15 U/L (ref 15–41)
Albumin: 3.3 g/dL — ABNORMAL LOW (ref 3.5–5.0)
Alkaline Phosphatase: 58 U/L (ref 38–126)
Anion gap: 7 (ref 5–15)
BUN: 12 mg/dL (ref 6–20)
CO2: 24 mmol/L (ref 22–32)
Calcium: 8.6 mg/dL — ABNORMAL LOW (ref 8.9–10.3)
Chloride: 105 mmol/L (ref 98–111)
Creatinine, Ser: 0.52 mg/dL (ref 0.44–1.00)
GFR, Estimated: >60 mL/min (ref >60)
Glucose, Bld: 114 mg/dL — ABNORMAL HIGH (ref 70–99)
Potassium: 3.7 mmol/L (ref 3.5–5.1)
Sodium: 136 mmol/L (ref 135–145)
Total Bilirubin: 0.7 mg/dL (ref 0.3–1.2)
Total Protein: 7.5 g/dL (ref 6.5–8.1)

## 2021-11-01 LAB — CBC
HCT: 28.5 % — ABNORMAL LOW (ref 36.0–46.0)
HCT: 29.1 % — ABNORMAL LOW (ref 36.0–46.0)
Hemoglobin: 8.5 g/dL — ABNORMAL LOW (ref 12.0–15.0)
Hemoglobin: 8.6 g/dL — ABNORMAL LOW (ref 12.0–15.0)
MCH: 26.5 pg (ref 26.0–34.0)
MCH: 26.1 pg (ref 26.0–34.0)
MCHC: 29.8 g/dL — ABNORMAL LOW (ref 30.0–36.0)
MCHC: 29.6 g/dL — ABNORMAL LOW (ref 30.0–36.0)
MCV: 88.8 fL (ref 80.0–100.0)
MCV: 88.4 fL (ref 80.0–100.0)
Platelets: 323 10*3/uL (ref 150–400)
Platelets: 304 10*3/uL (ref 150–400)
RBC: 3.21 MIL/uL — ABNORMAL LOW (ref 3.87–5.11)
RBC: 3.29 MIL/uL — ABNORMAL LOW (ref 3.87–5.11)
RDW: 14.4 % (ref 11.5–15.5)
RDW: 14.4 % (ref 11.5–15.5)
WBC: 7.3 10*3/uL (ref 4.0–10.5)
WBC: 7 10*3/uL (ref 4.0–10.5)
nRBC: 0 % (ref 0.0–0.2)
nRBC: 0 % (ref 0.0–0.2)

## 2021-11-01 LAB — VITAMIN B12: Vitamin B-12: 740 pg/mL (ref 180–914)

## 2021-11-01 LAB — FERRITIN: Ferritin: 432 ng/mL — ABNORMAL HIGH (ref 11–307)

## 2021-11-01 LAB — FOLATE: Folate: 34 ng/mL (ref >5.9)

## 2021-11-01 LAB — CREATININE, SERUM
Creatinine, Ser: 0.64 mg/dL (ref 0.44–1.00)
GFR, Estimated: >60 mL/min (ref >60)

## 2021-11-01 LAB — PATHOLOGIST SMEAR REVIEW

## 2021-11-01 LAB — HEMOGLOBIN A1C
Hgb A1c MFr Bld: 5.3 % (ref 4.8–5.6)
Mean Plasma Glucose: 105.41 mg/dL

## 2021-11-01 LAB — LACTIC ACID, PLASMA
Lactic Acid, Venous: 0.9 mmol/L (ref 0.5–1.9)
Lactic Acid, Venous: 0.5 mmol/L (ref 0.5–1.9)

## 2021-11-01 LAB — CK: Total CK: 15 U/L — ABNORMAL LOW (ref 38–234)

## 2021-11-01 LAB — TYPE AND SCREEN
ABO/RH(D): O POS
Antibody Screen: NEGATIVE

## 2021-11-01 LAB — SEDIMENTATION RATE: Sed Rate: 79 mm/hr — ABNORMAL HIGH (ref 0–20)

## 2021-11-01 LAB — T4, FREE: Free T4: 0.67 ng/dL (ref 0.61–1.12)

## 2021-11-01 LAB — RPR: RPR Ser Ql: NONREACTIVE

## 2021-11-01 LAB — C-REACTIVE PROTEIN: CRP: 8.3 mg/dL — ABNORMAL HIGH (ref <1.0)

## 2021-11-01 LAB — TSH: TSH: 2.517 u[IU]/mL (ref 0.350–4.500)

## 2021-11-01 LAB — D-DIMER, QUANTITATIVE: D-Dimer, Quant: 10.5 ug/mL-FEU — ABNORMAL HIGH (ref 0.00–0.50)

## 2021-11-01 IMAGING — US US EXTREM LOW*R* LIMITED
1 series · 13 of 25 positions shown · non-contrast
Comparison: [DATE]

CLINICAL DATA: Bilateral soft tissue abscesses status post drainage

EXAM:
ULTRASOUND LEFT LOWER EXTREMITY LIMITED
ULTRASOUND RIGHT LOWER EXTREMITY LIMITED
TECHNIQUE: Ultrasound examination of the lower extremity soft tissues was
performed in the areas of clinical concern.

[Series 1: us soft tissue lower extremity limited right (non- · 40 acquisitions, 13 frames shown]
[im 1/40]
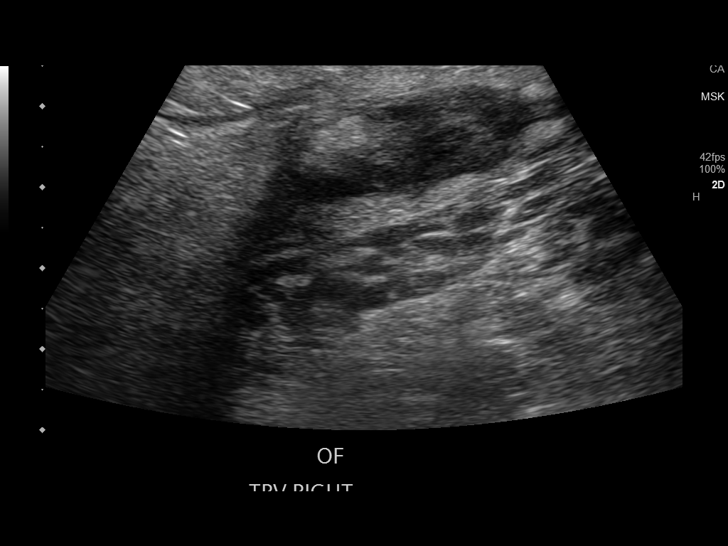
[im 4/40]
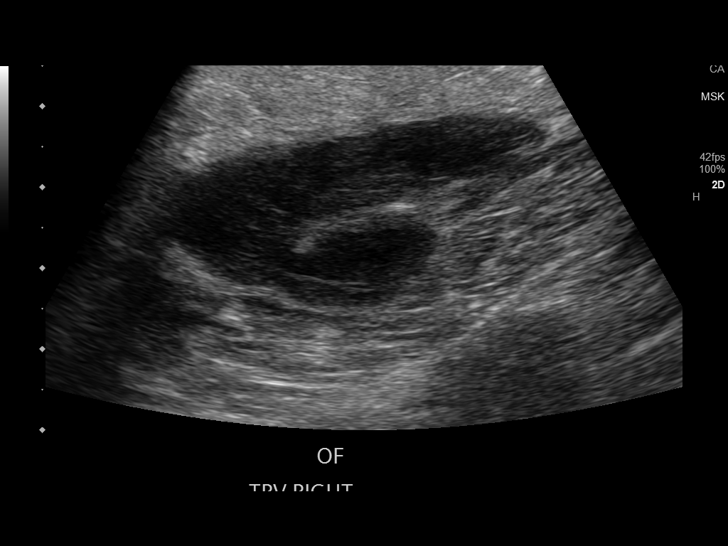
[im 7/40]
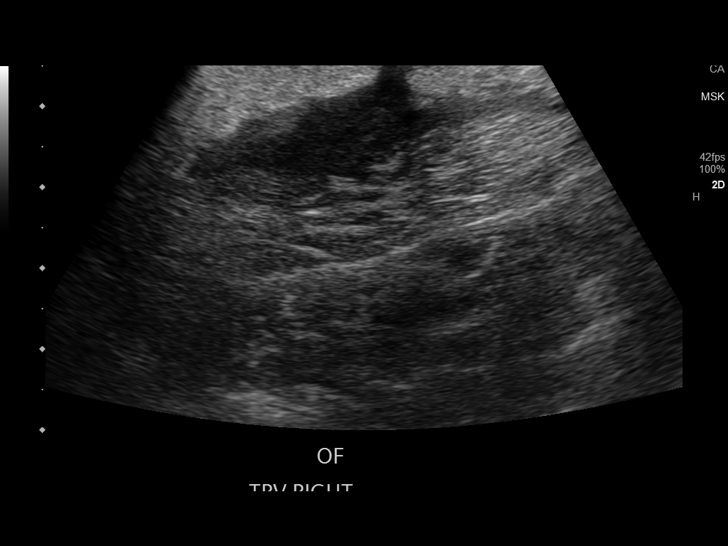
[im 10/40]
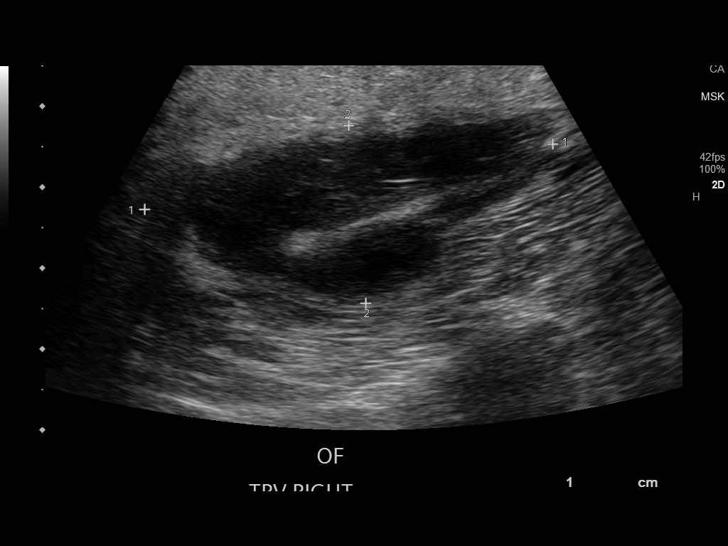
[im 14/40]
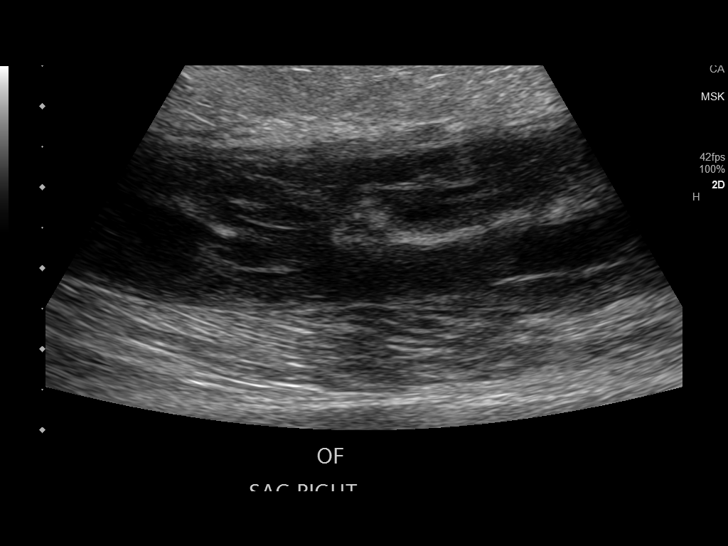
[im 17/40]
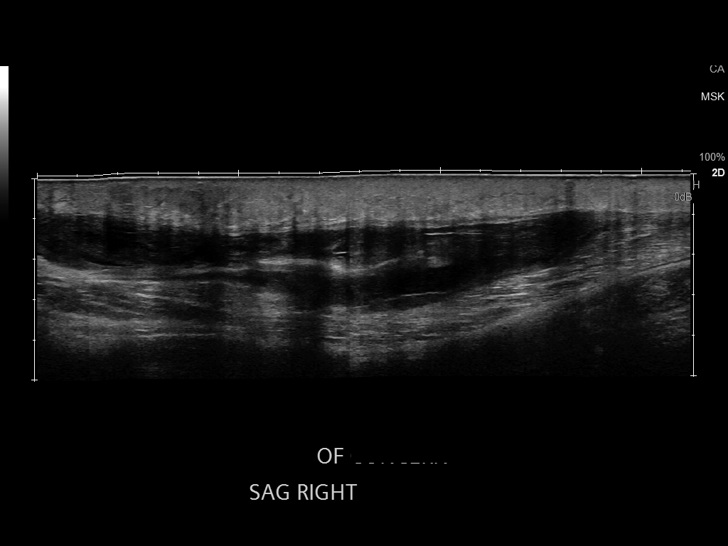
[im 20/40]
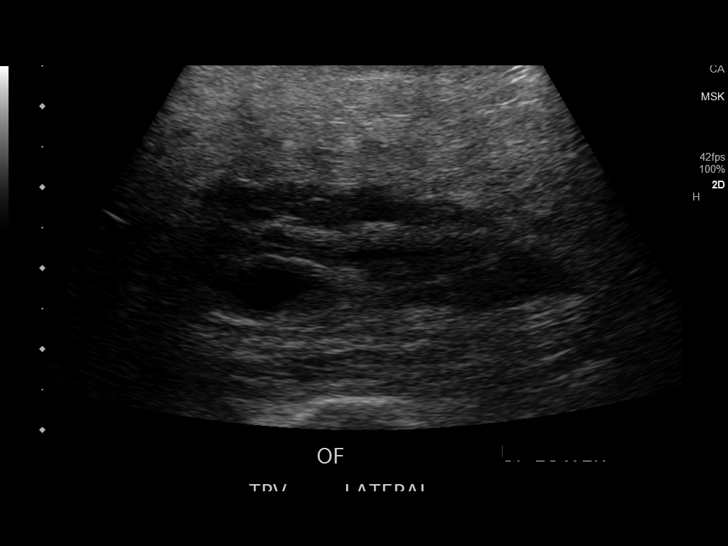
[im 23/40]
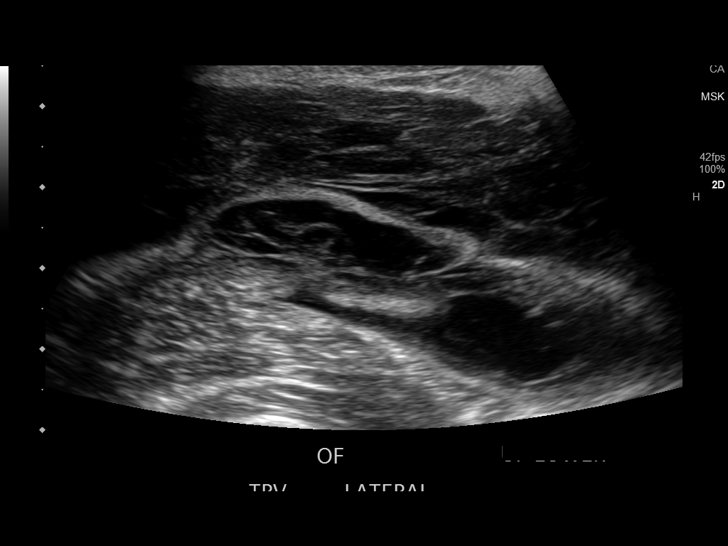
[im 27/40]
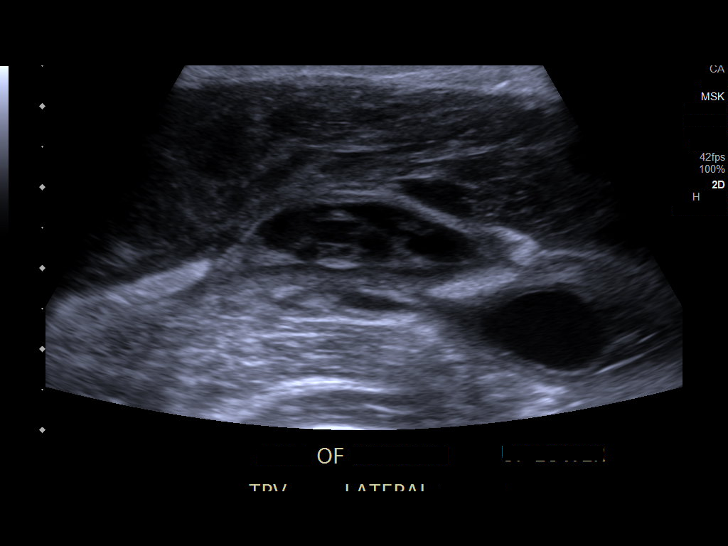
[im 30/40]
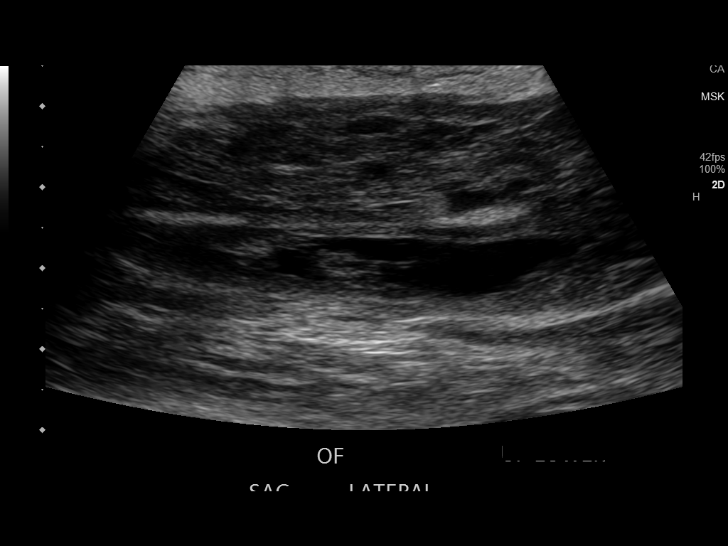
[im 33/40]
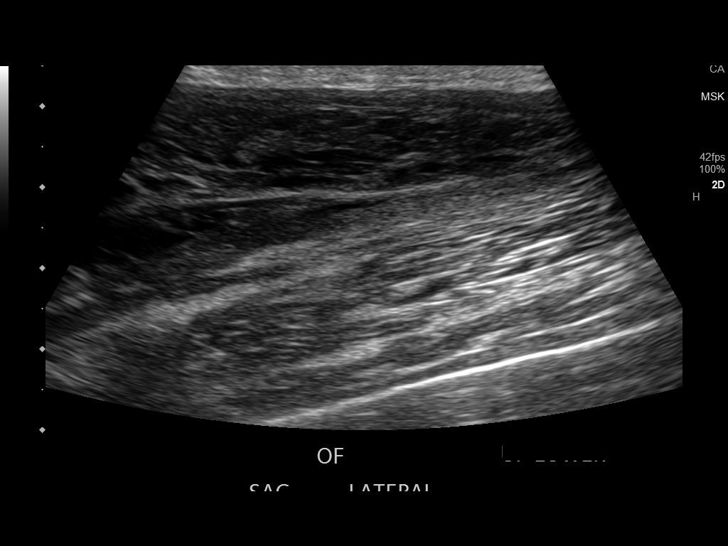
[im 36/40]
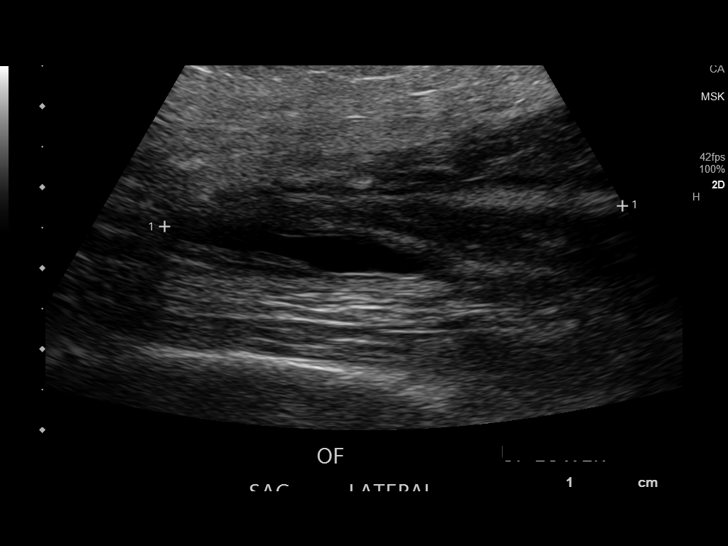
[im 40/40]
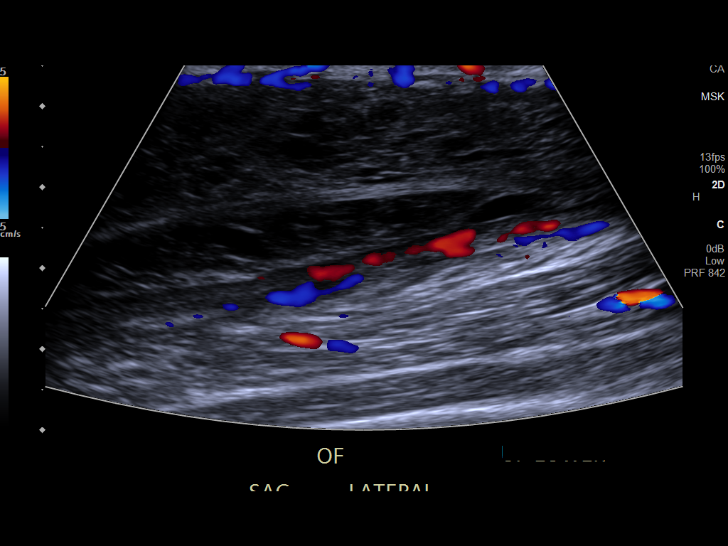

[13 of 25 positions shown; findings below may reference images not displayed]

FINDINGS: Left lower extremity: Sonographic evaluation of the upper
anterolateral left thigh was performed at the site of prior abscess.
Heterogeneous hypoechoic region along the lateral margin of the
quadriceps musculature measures 16.2 x 3.2 x 7.2 cm, with increased
peripheral color flow. There are small cystic fluid collections in
this region of hypervascularity. Overall, findings are most
consistent with myositis, with small focal abscesses. If further
evaluation is desired, repeat MRI may yield additional information.

Right lower extremity: In the right upper mid calf at the site of
the previous abscess, there is a complex hypoechoic fluid collection
measuring 5.1 x 2.2 x 15.0 cm, just deep to the subcutaneous fat.
Increased vascularity surrounding the fluid collection. Overall,
findings consistent with residual or recurrent abscess.
IMPRESSION: 1. Complex hypervascular region in the anterolateral left thigh,
consistent with residual myositis. Small fluid collections within
the area of inflammation could reflect recurrent abscesses. Repeat
MRI may yield additional information.
2. Recurrent or residual complex fluid collection in the right calf
just deep to the subcutaneous fat, consistent with abscess.

## 2021-11-01 IMAGING — DX DG CHEST 1V PORT
1 series · 1 of 1 positions shown · non-contrast
Comparison: Chest radiograph dated [DATE].

CLINICAL DATA: Back pain.

EXAM:
PORTABLE CHEST 1 VIEW

[chest ap]
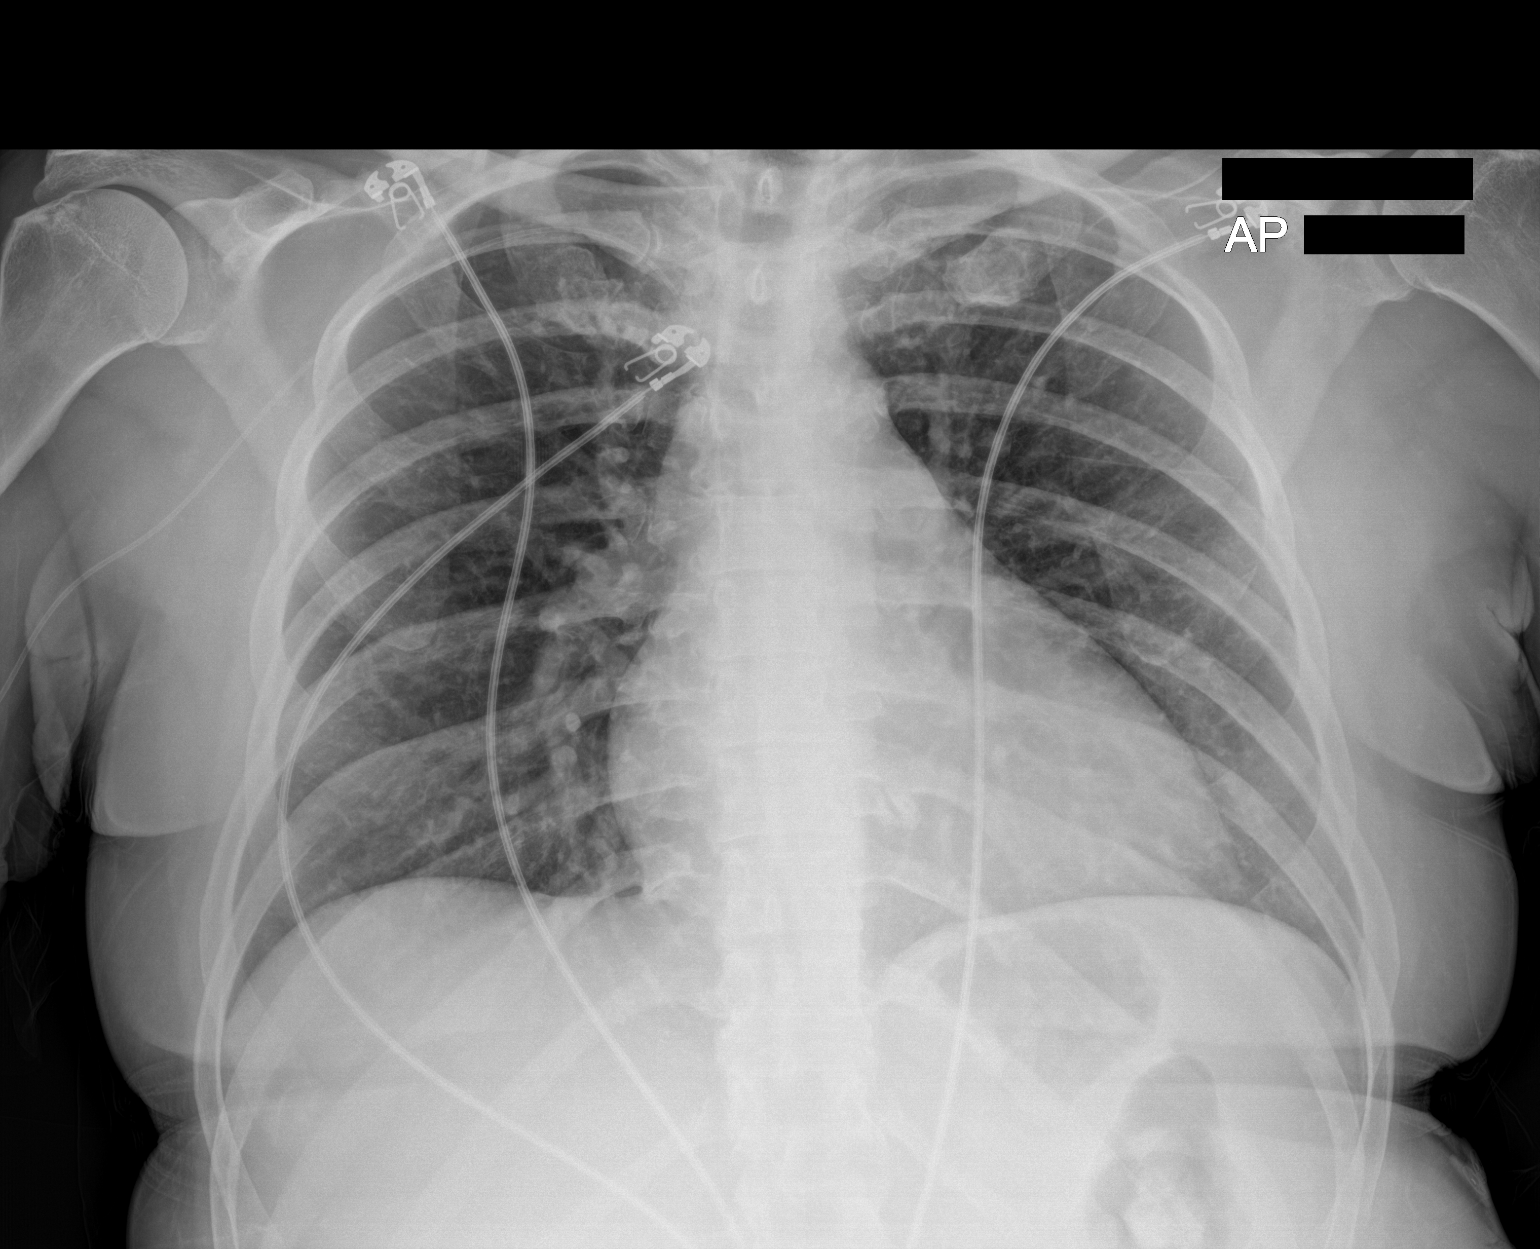

[1 of 1 positions shown; findings below may reference images not displayed]

FINDINGS: Right-sided PICC with tip over central SVC close to cavoatrial
junction. No focal consolidation, pleural effusion, or pneumothorax.
Top-normal cardiac silhouette. No acute osseous pathology.
IMPRESSION: No active disease.

## 2021-11-01 MED ORDER — SODIUM CHLORIDE 0.9 % IV SOLN
2.0000 g | Freq: Three times a day (TID) | INTRAVENOUS | Status: DC
  Administered 2021-11-01: 2 g via INTRAVENOUS
  Filled 2021-11-01 (×2): qty 12.5

## 2021-11-01 MED ORDER — SERTRALINE HCL 50 MG PO TABS
25.0000 mg | ORAL_TABLET | Freq: Every day | ORAL | Status: DC
Start: 2021-11-01 — End: 2021-11-12
  Administered 2021-11-02 – 2021-11-12 (×11): 25 mg via ORAL
  Filled 2021-11-01 (×12): qty 1

## 2021-11-01 MED ORDER — VANCOMYCIN HCL 750 MG/150ML IV SOLN
750.0000 mg | Freq: Three times a day (TID) | INTRAVENOUS | Status: DC
  Administered 2021-11-01 – 2021-11-03 (×6): 750 mg via INTRAVENOUS
  Filled 2021-11-01 (×8): qty 150

## 2021-11-01 MED ORDER — CHLORHEXIDINE GLUCONATE CLOTH 2 % EX PADS
6.0000 | MEDICATED_PAD | Freq: Every day | CUTANEOUS | Status: DC
  Administered 2021-11-02 – 2021-11-11 (×10): 6 via TOPICAL

## 2021-11-01 MED ORDER — HEPARIN SODIUM (PORCINE) 5000 UNIT/ML IJ SOLN
5000.0000 [IU] | Freq: Three times a day (TID) | INTRAMUSCULAR | Status: DC
  Administered 2021-11-01: 5000 [IU] via SUBCUTANEOUS
  Filled 2021-11-01: qty 1

## 2021-11-01 MED ORDER — VANCOMYCIN HCL IN DEXTROSE 1-5 GM/200ML-% IV SOLN
1000.0000 mg | Freq: Once | INTRAVENOUS | Status: AC
  Administered 2021-11-01: 1000 mg via INTRAVENOUS
  Filled 2021-11-01: qty 200

## 2021-11-01 MED ORDER — ONDANSETRON HCL 4 MG/2ML IJ SOLN
4.0000 mg | Freq: Three times a day (TID) | INTRAMUSCULAR | Status: DC
  Administered 2021-11-01 – 2021-11-07 (×9): 4 mg via INTRAVENOUS
  Filled 2021-11-01 (×13): qty 2

## 2021-11-01 MED ORDER — NICOTINE 7 MG/24HR TD PT24
7.0000 mg | MEDICATED_PATCH | Freq: Every day | TRANSDERMAL | Status: DC
  Administered 2021-11-01 – 2021-11-11 (×11): 7 mg via TRANSDERMAL
  Filled 2021-11-01 (×12): qty 1

## 2021-11-01 MED ORDER — SODIUM CHLORIDE 0.9 % IV SOLN: INTRAVENOUS | Status: DC

## 2021-11-01 MED ORDER — HEPARIN SODIUM (PORCINE) 5000 UNIT/ML IJ SOLN: 5000.0000 [IU] | Freq: Three times a day (TID) | INTRAMUSCULAR | Status: DC

## 2021-11-01 MED ORDER — HYDROMORPHONE HCL 1 MG/ML IJ SOLN
0.5000 mg | Freq: Once | INTRAMUSCULAR | Status: AC
Start: 2021-11-01 — End: 2021-11-01
  Administered 2021-11-01: 0.5 mg via INTRAVENOUS
  Filled 2021-11-01: qty 1

## 2021-11-01 MED ORDER — IBUPROFEN 600 MG PO TABS
600.0000 mg | ORAL_TABLET | Freq: Four times a day (QID) | ORAL | Status: DC | PRN
  Administered 2021-11-05 – 2021-11-11 (×9): 600 mg via ORAL
  Filled 2021-11-01 (×10): qty 1

## 2021-11-01 MED ORDER — ONDANSETRON HCL 4 MG PO TABS: 4.0000 mg | ORAL_TABLET | Freq: Four times a day (QID) | ORAL | Status: DC | PRN

## 2021-11-01 MED ORDER — ONDANSETRON HCL 4 MG/2ML IJ SOLN
4.0000 mg | Freq: Four times a day (QID) | INTRAMUSCULAR | Status: DC | PRN
  Administered 2021-11-07: 4 mg via INTRAVENOUS
  Filled 2021-11-01 (×2): qty 2

## 2021-11-01 MED ORDER — PANTOPRAZOLE SODIUM 40 MG IV SOLR
40.0000 mg | Freq: Two times a day (BID) | INTRAVENOUS | Status: DC
  Administered 2021-11-01: 40 mg via INTRAVENOUS
  Filled 2021-11-01: qty 10

## 2021-11-01 MED ORDER — OXYCODONE HCL 5 MG PO TABS
5.0000 mg | ORAL_TABLET | ORAL | Status: DC | PRN
  Administered 2021-11-01 – 2021-11-03 (×10): 5 mg via ORAL
  Filled 2021-11-01 (×10): qty 1

## 2021-11-01 MED ORDER — CYCLOBENZAPRINE HCL 10 MG PO TABS
5.0000 mg | ORAL_TABLET | Freq: Three times a day (TID) | ORAL | Status: DC | PRN
  Administered 2021-11-01 (×2): 5 mg via ORAL
  Filled 2021-11-01 (×2): qty 1

## 2021-11-01 NOTE — Consult Note (Signed)
NAME: Jill Cox  ?DOB: [birth year]  ?MRN: [id number]  ?Date/Time: 11/01/2021 10:00 AM ? ?REQUESTING PROVIDER: Dr.Patel ?Subjective:  ?REASON FOR CONSULT: MRSA infection ?? ?Jill Cox is a 46 y.o. 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 ?3/18  forearm I/D and 4th dorsal compartment extensor tenosynovectomy  culture positive for MRSA ?3/20 aspiration  rt knee- No mrsa ?3/20 Aspiration RT ankle - MRSA ?3/20 Rt calf- MRSA ?3/20 Aspiration left left Knee -No MRSA ?3/20 Left thigh abscess- -I/D MRSA ? ?She also had extensive vertebral involvement ?Rt facet  joint involvement  at T2-T3 /T3-T4  ?Cervical spine- dorsal epidural collection of 5 mm extending C1-C7 with moderate stenosis at C4-5, C5-6, and C6-7 level ? ?Lumbar MRI done without contrast on 09/28/21 collection at the left aspect of the thecal sac at L5, measuring approximately 10 x 11 x 23 ?Mm. L4-L5 disc involvement was questioned ?  ? ?She was initially treated with dual MRSA antibiotic with ceftaroline + Dapto and then discharged on dapto to complete 6 weeks until 11/13/21 ?After she went home she was doing fine for a week and then started doing stretching and started noticing pain left lumbar area. ?Also the rt calf I/D site had some foul smell with discharge after the sutures were removed. ?She also feels the left thigh site which has healed has pain and fullness. ?She saw ID physician yesterday at [facility] ?And his plan was to continue IV as planned  ? ?Pt was physically fit before all this started- She was building a home in [place] and was doing some heavy lifting- She is a runner-  used  to run  marathons and in the past 2 years has been running half marathon. ?She has no DM ?She had come to ED on 3/5 with upper back and shoulder pain and received trigger point infection and PO steroid which she took for a few days ? ? ?Past Medical History:  ?Diagnosis Date  ? Depression   ? Engages in [place]   ? PONV (postoperative nausea and vomiting)   ?  ?Past Surgical History:  ?Procedure Laterality Date  ? CHOLECYSTECTOMY    ? I & D EXTREMITY Left 09/30/2021  ? Procedure: IRRIGATION AND DEBRIDEMENT LEFT FOREARM ABSCESS;  Surgeon: Iran Planas, MD;  Location: [facility];  Service: Orthopedics;  Laterality: Left;  ? I & D EXTREMITY Bilateral 10/02/2021  ? Procedure: IRRIGATION AND DEBRIDEMENT EXTREMITY;  Surgeon: Altamese Thomasville, MD;  Location: [facility];  Service: Orthopedics;  Laterality: Bilateral;  arthrocentsis bilateral knees and right ankle ?  ? TEE WITHOUT CARDIOVERSION N/A 09/26/2021  ? Procedure: TRANSESOPHAGEAL ECHOCARDIOGRAM (TEE);  Surgeon: Minna Merritts, MD;  Location: [facility];  Service: Cardiovascular;  Laterality: N/A;  ?  ?Social History  ? ?Socioeconomic History  ? Marital status: Married  ?  Spouse name: Not on file  ? Number of children: Not on file  ? Years of education: Not on file  ? Highest education level: Not on file  ?Occupational History  ? Not on file  ?Tobacco Use  ? Smoking status: Never  ? Smokeless tobacco: Never  ? Tobacco comments:  ?  Pt is doing  vaping  ?Substance and Sexual Activity  ? Alcohol use: Not Currently  ? Drug use: Never  ? Sexual activity: Not on file  ?Other Topics Concern  ? Not on file  ?Social History Narrative  ? Not on file  ? ?Social Determinants of Health  ? ?Financial Resource Strain: Not on file  ?Food Insecurity: Not on file  ?Transportation Needs: Not on file  ?Physical Activity: Not on file  ?Stress: Not on file  ?Social Connections: Not on file  ?Intimate Partner Violence: Not on file  ?  ?Family History  ?Problem Relation Age of Onset  ? Heart disease  Mother   ? Heart disease Father   ? ?No Known Allergies ?I? ?Current Facility-Administered Medications  ?Medication Dose Route Frequency Provider Last Rate Last Admin  ? ceFEPIme (MAXIPIME) 2 g in sodium chloride 0.9 % 100 mL IVPB  2 g Intravenous Q8H Renda Rolls, RPH   Stopped at 11/01/21 0449  ? cyclobenzaprine (FLEXERIL) tablet 5 mg  5 mg Oral TID PRN Barb Merino, MD      ? heparin injection 5,000 Units  5,000 Units Subcutaneous Q8H Para Skeans, MD   5,000 Units at 11/01/21 0234  ? ibuprofen (ADVIL) tablet 600 mg  600 mg Oral Q6H PRN Barb Merino, MD      ? nicotine (NICODERM CQ - dosed in mg/24 hr) patch 7 mg  7 mg Transdermal Daily Para Skeans, MD   7 mg at 11/01/21 0913  ? ondansetron (ZOFRAN) injection 4 mg  4 mg Intravenous Q8H Para Skeans, MD   4 mg at 11/01/21 0517  ? ondansetron (ZOFRAN) tablet 4 mg  4 mg Oral Q6H PRN Para Skeans, MD      ? Or  ? ondansetron (ZOFRAN) injection 4 mg  4 mg Intravenous Q6H PRN Para Skeans, MD      ? oxyCODONE (Oxy IR/ROXICODONE) immediate release tablet 5 mg  5 mg Oral Q4H PRN Barb Merino, MD      ? vancomycin (VANCOREADY) IVPB 750 mg/150 mL  750 mg Intravenous Q8H Renda Rolls, RPH 150 mL/hr at 11/01/21 0908 750 mg at 11/01/21 0908  ? ?Current Outpatient Medications  ?Medication Sig Dispense Refill  ? acetaminophen (TYLENOL) 325 MG tablet Take 2 tablets (650 mg total) by mouth every 6 (six) hours as needed for mild pain or fever.    ? cyclobenzaprine (FLEXERIL) 10 MG tablet Take 1 tablet (10 mg total) by mouth 3 (three) times daily as needed for up to 20 days for muscle spasms. 60 tablet 0  ? daptomycin (CUBICIN) IVPB Inject 650 mg into the vein daily. Indication:  severe MRSA sepsis ?First Dose: Yes ?Last Day of Therapy:  11/13/21 ?Labs - Once weekly:  CBC/D, BMP, and CPK ?Labs - Every other week:  ESR and CRP ?Method of administration: IV Push ?Method of administration may be changed at the discretion of home infusion pharmacist based upon  assessment of the patient and/or caregiver's ability to self-administer the medication ordered. 39 Units 0  ? DAPTOmycin in sodium chloride 0.9 % 50 mL Inject 650 mg into the vein.    ? ferrous sulfate 325 (65 FE) MG tablet Take 1 tablet (325 mg total) by mouth daily with breakfast. 30 tablet 1  ? Multiple Vitamin (MULTIVITAMIN WITH MINERALS) TABS tablet Take 1 tablet by mouth daily.    ? naproxen (NAPROSYN) 500 MG tablet Take 500 mg by mouth 2 (two) times daily as needed.    ?  oxyCODONE-acetaminophen (PERCOCET/ROXICET) 5-325 MG tablet Take 1 tablet by mouth every 4 (four) hours as needed for moderate pain. 30 tablet 0  ? sertraline (ZOLOFT) 25 MG tablet Take 25 mg by mouth every morning.    ? feeding supplement (ENSURE ENLIVE / ENSURE PLUS) LIQD Take 237 mLs by mouth 2 (two) times daily between meals. 237 mL 12  ? lidocaine (XYLOCAINE) 5 % ointment Apply 1 application. topically 4 (four) times daily as needed. (Patient not taking: Reported on 11/01/2021) 35.44 g 1  ? potassium chloride SA (KLOR-CON M) 20 MEQ tablet Take 2 tablets (40 mEq total) by mouth daily. (Patient not taking: Reported on 11/01/2021) 6 tablet 0  ?  ? ?Abtx:  ?Anti-infectives (From admission, onward)  ? ? Start     Dose/Rate Route Frequency Ordered Stop  ? 11/01/21 1000  vancomycin (VANCOREADY) IVPB 750 mg/150 mL       ? 750 mg ?150 mL/hr over 60 Minutes Intravenous Every 8 hours 11/01/21 0109    ? 11/01/21 0300  ceFEPIme (MAXIPIME) 2 g in sodium chloride 0.9 % 100 mL IVPB       ? 2 g ?200 mL/hr over 30 Minutes Intravenous Every 8 hours 11/01/21 0101    ? 11/01/21 0130  vancomycin (VANCOCIN) IVPB 1000 mg/200 mL premix       ? 1,000 mg ?200 mL/hr over 60 Minutes Intravenous  Once 11/01/21 0059 11/01/21 0333  ? 10/31/21 2300  vancomycin (VANCOCIN) IVPB 1000 mg/200 mL premix       ? 1,000 mg ?200 mL/hr over 60 Minutes Intravenous  Once 10/31/21 2253 11/01/21 0110  ? ?  ? ? ?REVIEW OF SYSTEMS:  ?Const: negative fever, negative chills, negative weight  loss ?Eyes: negative diplopia or visual changes, negative eye pain ?ENT: negative coryza, negative sore throat ?Resp: negative cough, hemoptysis, dyspnea ?Cards: negative for chest pain, palpitations, lower extre

## 2021-11-01 NOTE — Progress Notes (Signed)
Admission profile complete ?

## 2021-11-01 NOTE — Assessment & Plan Note (Addendum)
Initially cultures were positive on 3/12 until 3/15 thereafter became negative and discharged home on daptomycin.   ?Readmitted on 4/18 due to positive bacteremia. Currently on IV vancomycin and ceftaroline followed by infectious disease. ?Underwent incision and drainage by neurosurgery on 4/21 with drain placement. Her lumbar drain was removed by neurosurgery 4/24. ?Status post incision and drainage by orthopedic of her bilateral lower extremity on 4/20.  Managed by orthopedic ?Pain control ?TTE and TEE, no vegetations  ?Blood cultures 4/22-NGTD ?PICC Removed.  Can be placed again closer to discharge ?

## 2021-11-01 NOTE — Progress Notes (Signed)
?PROGRESS NOTE ? ? ? Jill Cox  [id number] DOB: 23-Jul-1975 DOA: 10/31/2021 ?PCP: Bubba Camp, [place]  ? ? ?Brief Narrative:  ?46 year old with recent extensive hospitalization for MRSA bacteremia, multiple septic joints, epidural abscess treated conservatively and currently on IV daptomycin at home through 5/1 with a PICC line.  Followed up at ID clinic yesterday and deemed stable to complete antibiotics presented back to the ER after going home from clinic with worsening back pain. ?All cultures were cleared.  She is currently on IV daptomycin.  Hemodynamically neurologically stable in the ER. 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  Admitted due to worsening back pain and suspected not relieving epidural abscess. ? ? ?Assessment & Plan: ?  ?MRSA bacteremia, multiple bone and joint infection, epidural abscess: ?Hemodynamically neurologically stable.  Currently on daptomycin therapy at home with PICC line.  No IV drug use.  This was thought to be started from skin infection.  TEE was negative. ?Repeat cultures were cleared. ? ?-Admitted overnight, repeat blood cultures were sent.  Started on vancomycin and cefepime, probably will continue daptomycin but since infectious diseases on board, I will defer to the team. ?-Multiple immunological work-up on admission?  Has no significance in this case. ?-Worsening back pain which is possibly consequence of having spinal infection, there is evidence of epidural phlegmon, neurosurgery planning for possible washout.  Will need adequate pain medications, muscle relaxants, NSAIDs. ? ? ? ?DVT prophylaxis: heparin injection 5,000 Units Start: 11/01/21 0200 ? ? ?Code Status: Full code ?Family Communication: None at bedside ?Disposition Plan: Status is: Inpatient ?Remains inpatient appropriate because: Significant infection ?  ? ? ?Consultants:   ?Infectious disease ?Neurosurgery ? ?Procedures:  ?None today ? ?Antimicrobials:  ?Daptomycin at home, now is started on vancomycin and cefepime ? ? ?Subjective: ?Patient seen and examined.  Eating breakfast.  Looks comfortable.  Mostly worried about the back pain.  She does not want to use any narcotics at home.  Afebrile. ? ?Objective: ?Vitals:  ? 11/01/21 0030 11/01/21 0120 11/01/21 0300 11/01/21 0430  ?BP: 125/69 131/70 117/60 125/65  ?Pulse: 66 67 (!) 59 64  ?Resp:  17 15 16   ?Temp:  98.3 ?F (36.8 ?C)    ?TempSrc:      ?SpO2:  100% 98% 98%  ? ? ?Intake/Output Summary (Last 24 hours) at 11/01/2021 0859 ?Last data filed at 11/01/2021 0110 ?Gross per 24 hour  ?Intake 686.63 ml  ?Output --  ?Net 686.63 ml  ? ?There were no vitals filed for this visit. ? ?Examination: ? ?General exam: Appears calm and comfortable  ?Respiratory system: Clear to auscultation. Respiratory effort normal.  No added sounds. ?Cardiovascular system: S1 & S2 heard, RRR. No pedal edema. ?Gastrointestinal system: Soft.  Nontender.  Bowel sound present.   ?Central nervous system: Alert and oriented. No focal neurological deficits. ?Mild tenderness on the lower back. ?Extremities: Symmetric 5 x 5 power. ?Skin: No rashes, lesions or ulcers ?Psychiatry: Judgement and insight appear normal. Mood & affect appropriate.  ?PICC line present right AC. ?Surgical scars on the wrist and hands nontender and healing well. ? ? ? ?Data Reviewed: I have personally reviewed following labs and imaging studies ? ?CBC: ?Recent Labs  ?Lab 10/31/21 ?1642 11/01/21 ?0108 11/01/21 ?0415  ?WBC 7.6 7.3 7.0  ?NEUTROABS 5.5  --   --   ?HGB 10.1* 8.5* 8.6*  ?HCT 33.6* 28.5* 29.1*  ?  MCV 88.0 88.8 88.4  ?PLT 377 323 304  ? ?Basic Metabolic Panel: ?Recent Labs  ?Lab 10/31/21 ?1642 11/01/21 ?0108 11/01/21 ?0415  ?NA 138  --  136  ?K 4.1  --  3.7  ?CL 103  --  105  ?CO2 24  --  24  ?GLUCOSE 90  --  114*  ?BUN 19  --  12  ?CREATININE 0.72 0.64 0.52  ?CALCIUM 9.3  --  8.6*   ? ?GFR: ?Estimated Creatinine Clearance: 93.9 mL/min (by C-G formula based on SCr of 0.52 mg/dL). ?Liver Function Tests: ?Recent Labs  ?Lab 10/31/21 ?1642 11/01/21 ?0415  ?AST 19 15  ?ALT 13 12  ?ALKPHOS 70 58  ?BILITOT 0.5 0.7  ?PROT 8.7* 7.5  ?ALBUMIN 3.7 3.3*  ? ?No results for input(s): LIPASE, AMYLASE in the last 168 hours. ?No results for input(s): AMMONIA in the last 168 hours. ?Coagulation Profile: ?No results for input(s): INR, PROTIME in the last 168 hours. ?Cardiac Enzymes: ?Recent Labs  ?Lab 11/01/21 ?0108  ?CKTOTAL 15*  ? ?BNP (last 3 results) ?No results for input(s): PROBNP in the last 8760 hours. ?HbA1C: ?Recent Labs  ?  11/01/21 ?0108  ?HGBA1C 5.3  ? ?CBG: ?No results for input(s): GLUCAP in the last 168 hours. ?Lipid Profile: ?No results for input(s): CHOL, HDL, LDLCALC, TRIG, CHOLHDL, LDLDIRECT in the last 72 hours. ?Thyroid Function Tests: ?Recent Labs  ?  11/01/21 ?0108  ?TSH 2.517  ?FREET4 0.67  ? ?Anemia Panel: ?Recent Labs  ?  11/01/21 ?0108  ?FOLATE 34.0  ?FERRITIN 432*  ?TIBC 253  ?IRON 24*  ?RETICCTPCT 3.0  ? ?Sepsis Labs: ?Recent Labs  ?Lab 11/01/21 ?0108 11/01/21 ?0415  ?LATICACIDVEN 0.9 0.5  ? ? ?Recent Results (from the past 240 hour(s))  ?Culture, blood (routine x 2)     Status: None (Preliminary result)  ? Collection Time: 10/31/21 11:23 PM  ? Specimen: BLOOD  ?Result Value Ref Range Status  ? Specimen Description BLOOD RIGHT FOREARM  Final  ? Special Requests   Final  ?  BOTTLES DRAWN AEROBIC AND ANAEROBIC Blood Culture results may not be optimal due to an excessive volume of blood received in culture bottles  ? Culture   Final  ?  NO GROWTH < 12 HOURS ?Performed at [facility], [address] [place], [place] [address] ?  ? Report Status PENDING  Incomplete  ?Culture, blood (routine x 2)     Status: None (Preliminary result)  ? Collection Time: 10/31/21 11:23 PM  ? Specimen: BLOOD  ?Result Value Ref Range Status  ? Specimen Description BLOOD LEFT ASSIST CONTROL  Final   ? Special Requests   Final  ?  BOTTLES DRAWN AEROBIC AND ANAEROBIC Blood Culture results may not be optimal due to an excessive volume of blood received in culture bottles  ? Culture   Final  ?  NO GROWTH < 12 HOURS ?Performed at [facility], [address], [place],  [address] ?  ? Report Status PENDING  Incomplete  ?MRSA Next Gen by PCR, Nasal     Status: None  ? Collection Time: 11/01/21  2:44 AM  ? Specimen: Nasal Mucosa; Nasal Swab  ?Result Value Ref Range Status  ? MRSA by PCR Next Gen NOT DETECTED NOT DETECTED Final  ?  Comment: (NOTE) ?The GeneXpert MRSA Assay (FDA approved for NASAL specimens only), ?is one component of a comprehensive MRSA colonization surveillance ?program. It is not intended to diagnose MRSA infection nor to  guide ?or monitor treatment for MRSA infections. ?Test performance is not FDA approved in patients less than 2 years ?old. ?Performed at [facility], [place], ?[place] [address] ?  ?  ? ? ? ? ? ?Radiology Studies: ?MR THORACIC SPINE W WO CONTRAST ? ?Result Date: 10/31/2021 ?CLINICAL DATA:  Worsening back pain, recent epidural abscess; low back pain, recent thoracic epidural abscess, worsening low back pain EXAM: MRI THORACIC AND LUMBAR SPINE WITHOUT AND WITH CONTRAST TECHNIQUE: Multiplanar and multiecho pulse sequences of the thoracic and lumbar spine were obtained without and with intravenous contrast. CONTRAST:  89mL GADAVIST GADOBUTROL 1 MMOL/ML IV SOLN COMPARISON:  MRI thoracic spine 09/26/2021, MRI lumbar spine 09/28/2021 FINDINGS: MRI THORACIC SPINE FINDINGS Alignment:  Physiologic. Vertebrae: Minimal persistent periarticular marrow edema along the right T2-T3 facet with persistent mild facet effusion and adjacent soft tissue enhancement. Generalized low T1 marrow signal. Cord: No abnormal cord signal. Decreased mass effect on the upper thoracic spine thecal sac. Paraspinal and other soft tissues: Adjacent soft tissue edema and  enhancement on the right adjacent to the T2-T3 facet, decreased from the prior exam. Disc levels: Decreased dorsal epidural phlegmon of the upper thoracic spine, now measuring 2 mm in thickness, previously up to 5 mm,

## 2021-11-01 NOTE — Progress Notes (Signed)
[facility] Lab scanner not working lab for wound sent with temporary label. Lab called unable to accept specimen. MD notified for signature. Paperwork filled out to correct specimen labeling pre lab protocol. ? ?

## 2021-11-01 NOTE — Consult Note (Signed)
? ?Referring Physician:  ?No referring provider defined for this encounter. ? ?Primary Physician:  ?Bubba Camp, [place] ? ?Chief Complaint:  back pain ? ?History of Present Illness: ?11/01/2021 ?Jill Cox is a 46 y.o. female who presents with the chief complaint of worsening back pain.  She was previously evaluated last month and diagnosed with MRSA infection.  She underwent surgery with washout of her left wrist and her right lower leg in March 2023 at [facility]. ? ?She was discharged on antibiotics and has been seen regularly and follow-up with infectious disease.  Her labs were improving.  However, she began having worsening pain in her back that is made worse by bearing weight.  She is also had occasional pain down her left leg.  No weakness, bowel bladder dysfunction, or sensory changes.  She is able to ambulate. ? ?The symptoms are causing a significant impact on the patient's life.  ? ?Review of Systems:  ?A 10 point review of systems is negative, except for the pertinent positives and negatives detailed in the HPI. ? ?Past Medical History: ?Past Medical History:  ?Diagnosis Date  ? Depression   ? Engages in [place]   ? PONV (postoperative nausea and vomiting)   ? ? ?Past Surgical History: ?Past Surgical History:  ?Procedure Laterality Date  ? CHOLECYSTECTOMY    ? I & D EXTREMITY Left 09/30/2021  ? Procedure: IRRIGATION AND DEBRIDEMENT LEFT FOREARM ABSCESS;  Surgeon: Iran Planas, MD;  Location: [place];  Service: Orthopedics;  Laterality: Left;  ? I & D EXTREMITY Bilateral 10/02/2021  ? Procedure: IRRIGATION AND DEBRIDEMENT EXTREMITY;  Surgeon: Altamese [place], MD;  Location: [place];  Service: Orthopedics;  Laterality: Bilateral;  arthrocentsis bilateral knees and right ankle ?  ? TEE WITHOUT CARDIOVERSION N/A 09/26/2021  ? Procedure: TRANSESOPHAGEAL ECHOCARDIOGRAM (TEE);  Surgeon: Minna Merritts, MD;  Location: [facility];  Service: Cardiovascular;  Laterality: N/A;  ? ? ?Allergies: ?Allergies as  of 10/31/2021  ? (No Known Allergies)  ? ? ?Medications: ? ?Current Facility-Administered Medications:  ?  0.9 %  sodium chloride infusion, , Intravenous, Continuous, Para Skeans, MD, Last Rate: 100 mL/hr at 11/01/21 0231, New Bag at 11/01/21 0231 ?  ceFEPIme (MAXIPIME) 2 g in sodium chloride 0.9 % 100 mL IVPB, 2 g, Intravenous, Q8H, Belue, Alver Sorrow, RPH, Stopped at 11/01/21 0449 ?  heparin injection 5,000 Units, 5,000 Units, Subcutaneous, Q8H, Para Skeans, MD, 5,000 Units at 11/01/21 0234 ?  nicotine (NICODERM CQ - dosed in mg/24 hr) patch 7 mg, 7 mg, Transdermal, Daily, Para Skeans, MD ?  ondansetron [facility]) injection 4 mg, 4 mg, Intravenous, Q8H, Para Skeans, MD, 4 mg at 11/01/21 0517 ?  ondansetron (ZOFRAN) tablet 4 mg, 4 mg, Oral, Q6H PRN **OR** ondansetron (ZOFRAN) injection 4 mg, 4 mg, Intravenous, Q6H PRN, Para Skeans, MD ?  pantoprazole (PROTONIX) injection 40 mg, 40 mg, Intravenous, Q12H, Florina Ou V, MD, 40 mg at 11/01/21 0112 ?  vancomycin (VANCOREADY) IVPB 750 mg/150 mL, 750 mg, Intravenous, Q8H, Belue, Alver Sorrow, RPH ? ?Current Outpatient Medications:  ?  acetaminophen (TYLENOL) 325 MG tablet, Take 2 tablets (650 mg total) by mouth every 6 (six) hours as needed for mild pain or fever., Disp: , Rfl:  ?  cyclobenzaprine (FLEXERIL) 10 MG tablet, Take 1 tablet (10 mg total) by mouth 3 (three) times daily as needed for up to 20 days for muscle spasms., Disp: 60 tablet, Rfl: 0 ?  daptomycin (CUBICIN) IVPB,  Inject 650 mg into the vein daily. Indication:  severe MRSA sepsis First Dose: Yes Last Day of Therapy:  11/13/21 Labs - Once weekly:  CBC/D, BMP, and CPK Labs - Every other week:  ESR and CRP Method of administration: IV Push Method of administration may be changed at the discretion of home infusion pharmacist based upon assessment of the patient and/or caregiver's ability to self-administer the medication ordered., Disp: 39 Units, Rfl: 0 ?  DAPTOmycin in sodium chloride 0.9 % 50 mL, Inject 650  mg into the vein., Disp: , Rfl:  ?  ferrous sulfate 325 (65 FE) MG tablet, Take 1 tablet (325 mg total) by mouth daily with breakfast., Disp: 30 tablet, Rfl: 1 ?  Multiple Vitamin (MULTIVITAMIN WITH MINERALS) TABS tablet, Take 1 tablet by mouth daily., Disp: , Rfl:  ?  naproxen (NAPROSYN) 500 MG tablet, Take 500 mg by mouth 2 (two) times daily as needed., Disp: , Rfl:  ?  oxyCODONE-acetaminophen (PERCOCET/ROXICET) 5-325 MG tablet, Take 1 tablet by mouth every 4 (four) hours as needed for moderate pain., Disp: 30 tablet, Rfl: 0 ?  sertraline (ZOLOFT) 25 MG tablet, Take 25 mg by mouth every morning., Disp: , Rfl:  ?  feeding supplement (ENSURE ENLIVE / ENSURE PLUS) LIQD, Take 237 mLs by mouth 2 (two) times daily between meals., Disp: 237 mL, Rfl: 12 ?  lidocaine (XYLOCAINE) 5 % ointment, Apply 1 application. topically 4 (four) times daily as needed. (Patient not taking: Reported on 11/01/2021), Disp: 35.44 g, Rfl: 1 ?  potassium chloride SA (KLOR-CON M) 20 MEQ tablet, Take 2 tablets (40 mEq total) by mouth daily. (Patient not taking: Reported on 11/01/2021), Disp: 6 tablet, Rfl: 0 ? ? ?Social History: ?Social History  ? ?Tobacco Use  ? Smoking status: Never  ? Smokeless tobacco: Never  ? Tobacco comments:  ?  Pt is doing vaping  ?Substance Use Topics  ? Alcohol use: Not Currently  ? Drug use: Never  ? ? ?Family Medical History: ?Family History  ?Problem Relation Age of Onset  ? Heart disease Mother   ? Heart disease Father   ? ? ?Physical Examination: ?Vitals:  ? 11/01/21 0300 11/01/21 0430  ?BP: 117/60 125/65  ?Pulse: (!) 59 64  ?Resp: 15 16  ?Temp:    ?SpO2: 98% 98%  ? ?Heart sounds normal no MRG. Chest Clear to Auscultation Bilaterally. ? ? ?General: Patient is well developed, well nourished, calm, collected, and in no apparent distress. ? ?Psychiatric: Patient is non-anxious. ? ?Head:  Pupils equal, round, and reactive to light. ? ?ENT:  Oral mucosa appears well hydrated. ? ?Neck:   Supple.  Full range of  motion. ? ?Respiratory: Patient is breathing without any difficulty. ? ?Extremities: No edema. ? ?Vascular: Palpable pulses in dorsal pedal vessels. ? ?Skin:   On exposed skin, there are no abnormal skin lesions. ? ?NEUROLOGICAL:  ?General: In no acute distress.   ?Awake, alert, oriented to person, place, and time.  Pupils equal round and reactive to light.  Facial tone is symmetric.  Tongue protrusion is midline.  There is no pronator drift. ? ?ROM of spine untested ? ?Strength: ?Side Biceps Triceps Deltoid Interossei Grip Wrist Ext. Wrist Flex.  ?R 5 5 5 5 5 5 5   ?L 5 5 5 5 5 5 5   ? ?Side Iliopsoas Quads Hamstring PF DF EHL  ?R 5 5 5 5 5 5   ?L 5 5 5 5 5 5   ? ? ?Bilateral upper and lower  extremity sensation is intact to light touch. ?Reflexes are 1+ and symmetric at the biceps, triceps, brachioradialis, patella and achilles. Hoffman's is absent. ? ?Clonus is not present.  Toes are down-going.   ? ?Gait is untested.  ? 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 Phlegmon tracks along the ?descending left S1 nerve root. Adjacent paraspinal soft tissue ?inflammation. ?  ?Septic left L2-L3 facet arthritis with adjacent marrow edema in the ?inferior articular process of L2, adjacent left-sided paraspinal ?soft tissue inflammation with focal phlegmon/developing abscess ?measuring 1.7 x 1.0 cm. ?  ?  ?Electronically Signed ?  By: Maurine Simmering M.D. ?  On: 10/31/2021 22:04 ?  ? ?I have personally reviewed the images and agree with the above interpretation. ? ?Labs: ? ?  Latest Ref Rng & Units 11/01/2021  ?  4:15 AM 11/01/2021  ?  1:08 AM 10/31/2021  ?  4:42 PM  ?CBC  ?WBC 4.0 - 10.5 K/uL 7.0   7.3   7.6    ?Hemoglobin 12.0 - 15.0 g/dL 8.6   8.5   10.1    ?Hematocrit 36.0 - 46.0 % 29.1   28.5   33.6    ?Platelets 150 - 400 K/uL 304   323   377     ? ? ? ? ? ?Assessment and Plan: ?Jill Cox is a pleasant 46 y.o. 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

## 2021-11-01 NOTE — H&P (View-Only) (Signed)
? ?Referring Physician:  ?No referring provider defined for this encounter. ? ?Primary Physician:  ?Bubba Camp, [place] ? ?Chief Complaint:  back pain ? ?History of Present Illness: ?11/01/2021 ?Jill Cox is a 46 y.o. female who presents with the chief complaint of worsening back pain.  She was previously evaluated last month and diagnosed with MRSA infection.  She underwent surgery with washout of her left wrist and her right lower leg in March 2023 at [facility]. ? ?She was discharged on antibiotics and has been seen regularly and follow-up with infectious disease.  Her labs were improving.  However, she began having worsening pain in her back that is made worse by bearing weight.  She is also had occasional pain down her left leg.  No weakness, bowel bladder dysfunction, or sensory changes.  She is able to ambulate. ? ?The symptoms are causing a significant impact on the patient's life.  ? ?Review of Systems:  ?A 10 point review of systems is negative, except for the pertinent positives and negatives detailed in the HPI. ? ?Past Medical History: ?Past Medical History:  ?Diagnosis Date  ? Depression   ? Engages in [place]   ? PONV (postoperative nausea and vomiting)   ? ? ?Past Surgical History: ?Past Surgical History:  ?Procedure Laterality Date  ? CHOLECYSTECTOMY    ? I & D EXTREMITY Left 09/30/2021  ? Procedure: IRRIGATION AND DEBRIDEMENT LEFT FOREARM ABSCESS;  Surgeon: Iran Planas, MD;  Location: [place];  Service: Orthopedics;  Laterality: Left;  ? I & D EXTREMITY Bilateral 10/02/2021  ? Procedure: IRRIGATION AND DEBRIDEMENT EXTREMITY;  Surgeon: Altamese Auberry, MD;  Location: [place];  Service: Orthopedics;  Laterality: Bilateral;  arthrocentsis bilateral knees and right ankle ?  ? TEE WITHOUT CARDIOVERSION N/A 09/26/2021  ? Procedure: TRANSESOPHAGEAL ECHOCARDIOGRAM (TEE);  Surgeon: Minna Merritts, MD;  Location: [facility];  Service: Cardiovascular;  Laterality: N/A;  ? ? ?Allergies: ?Allergies as  of 10/31/2021  ? (No Known Allergies)  ? ? ?Medications: ? ?Current Facility-Administered Medications:  ?  0.9 %  sodium chloride infusion, , Intravenous, Continuous, Para Skeans, MD, Last Rate: 100 mL/hr at 11/01/21 0231, New Bag at 11/01/21 0231 ?  ceFEPIme (MAXIPIME) 2 g in sodium chloride 0.9 % 100 mL IVPB, 2 g, Intravenous, Q8H, Belue, Alver Sorrow, RPH, Stopped at 11/01/21 0449 ?  heparin injection 5,000 Units, 5,000 Units, Subcutaneous, Q8H, Para Skeans, MD, 5,000 Units at 11/01/21 0234 ?  nicotine (NICODERM CQ - dosed in mg/24 hr) patch 7 mg, 7 mg, Transdermal, Daily, Para Skeans, MD ?  ondansetron [facility]) injection 4 mg, 4 mg, Intravenous, Q8H, Para Skeans, MD, 4 mg at 11/01/21 0517 ?  ondansetron (ZOFRAN) tablet 4 mg, 4 mg, Oral, Q6H PRN **OR** ondansetron (ZOFRAN) injection 4 mg, 4 mg, Intravenous, Q6H PRN, Para Skeans, MD ?  pantoprazole (PROTONIX) injection 40 mg, 40 mg, Intravenous, Q12H, Florina Ou V, MD, 40 mg at 11/01/21 0112 ?  vancomycin (VANCOREADY) IVPB 750 mg/150 mL, 750 mg, Intravenous, Q8H, Belue, Alver Sorrow, RPH ? ?Current Outpatient Medications:  ?  acetaminophen (TYLENOL) 325 MG tablet, Take 2 tablets (650 mg total) by mouth every 6 (six) hours as needed for mild pain or fever., Disp: , Rfl:  ?  cyclobenzaprine (FLEXERIL) 10 MG tablet, Take 1 tablet (10 mg total) by mouth 3 (three) times daily as needed for up to 20 days for muscle spasms., Disp: 60 tablet, Rfl: 0 ?  daptomycin (CUBICIN) IVPB,  Inject 650 mg into the vein daily. Indication:  severe MRSA sepsis First Dose: Yes Last Day of Therapy:  11/13/21 Labs - Once weekly:  CBC/D, BMP, and CPK Labs - Every other week:  ESR and CRP Method of administration: IV Push Method of administration may be changed at the discretion of home infusion pharmacist based upon assessment of the patient and/or caregiver's ability to self-administer the medication ordered., Disp: 39 Units, Rfl: 0 ?  DAPTOmycin in sodium chloride 0.9 % 50 mL, Inject 650  mg into the vein., Disp: , Rfl:  ?  ferrous sulfate 325 (65 FE) MG tablet, Take 1 tablet (325 mg total) by mouth daily with breakfast., Disp: 30 tablet, Rfl: 1 ?  Multiple Vitamin (MULTIVITAMIN WITH MINERALS) TABS tablet, Take 1 tablet by mouth daily., Disp: , Rfl:  ?  naproxen (NAPROSYN) 500 MG tablet, Take 500 mg by mouth 2 (two) times daily as needed., Disp: , Rfl:  ?  oxyCODONE-acetaminophen (PERCOCET/ROXICET) 5-325 MG tablet, Take 1 tablet by mouth every 4 (four) hours as needed for moderate pain., Disp: 30 tablet, Rfl: 0 ?  sertraline (ZOLOFT) 25 MG tablet, Take 25 mg by mouth every morning., Disp: , Rfl:  ?  feeding supplement (ENSURE ENLIVE / ENSURE PLUS) LIQD, Take 237 mLs by mouth 2 (two) times daily between meals., Disp: 237 mL, Rfl: 12 ?  lidocaine (XYLOCAINE) 5 % ointment, Apply 1 application. topically 4 (four) times daily as needed. (Patient not taking: Reported on 11/01/2021), Disp: 35.44 g, Rfl: 1 ?  potassium chloride SA (KLOR-CON M) 20 MEQ tablet, Take 2 tablets (40 mEq total) by mouth daily. (Patient not taking: Reported on 11/01/2021), Disp: 6 tablet, Rfl: 0 ? ? ?Social History: ?Social History  ? ?Tobacco Use  ? Smoking status: Never  ? Smokeless tobacco: Never  ? Tobacco comments:  ?  Pt is doing vaping  ?Substance Use Topics  ? Alcohol use: Not Currently  ? Drug use: Never  ? ? ?Family Medical History: ?Family History  ?Problem Relation Age of Onset  ? Heart disease Mother   ? Heart disease Father   ? ? ?Physical Examination: ?Vitals:  ? 11/01/21 0300 11/01/21 0430  ?BP: 117/60 125/65  ?Pulse: (!) 59 64  ?Resp: 15 16  ?Temp:    ?SpO2: 98% 98%  ? ?Heart sounds normal no MRG. Chest Clear to Auscultation Bilaterally. ? ? ?General: Patient is well developed, well nourished, calm, collected, and in no apparent distress. ? ?Psychiatric: Patient is non-anxious. ? ?Head:  Pupils equal, round, and reactive to light. ? ?ENT:  Oral mucosa appears well hydrated. ? ?Neck:   Supple.  Full range of  motion. ? ?Respiratory: Patient is breathing without any difficulty. ? ?Extremities: No edema. ? ?Vascular: Palpable pulses in dorsal pedal vessels. ? ?Skin:   On exposed skin, there are no abnormal skin lesions. ? ?NEUROLOGICAL:  ?General: In no acute distress.   ?Awake, alert, oriented to person, place, and time.  Pupils equal round and reactive to light.  Facial tone is symmetric.  Tongue protrusion is midline.  There is no pronator drift. ? ?ROM of spine untested ? ?Strength: ?Side Biceps Triceps Deltoid Interossei Grip Wrist Ext. Wrist Flex.  ?R 5 5 5 5 5 5 5   ?L 5 5 5 5 5 5 5   ? ?Side Iliopsoas Quads Hamstring PF DF EHL  ?R 5 5 5 5 5 5   ?L 5 5 5 5 5 5   ? ? ?Bilateral upper and lower  extremity sensation is intact to light touch. ?Reflexes are 1+ and symmetric at the biceps, triceps, brachioradialis, patella and achilles. Hoffman's is absent. ? ?Clonus is not present.  Toes are down-going.   ? ?Gait is untested.  ? 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 Phlegmon tracks along the ?descending left S1 nerve root. Adjacent paraspinal soft tissue ?inflammation. ?  ?Septic left L2-L3 facet arthritis with adjacent marrow edema in the ?inferior articular process of L2, adjacent left-sided paraspinal ?soft tissue inflammation with focal phlegmon/developing abscess ?measuring 1.7 x 1.0 cm. ?  ?  ?Electronically Signed ?  By: Maurine Simmering M.D. ?  On: 10/31/2021 22:04 ?  ? ?I have personally reviewed the images and agree with the above interpretation. ? ?Labs: ? ?  Latest Ref Rng & Units 11/01/2021  ?  4:15 AM 11/01/2021  ?  1:08 AM 10/31/2021  ?  4:42 PM  ?CBC  ?WBC 4.0 - 10.5 K/uL 7.0   7.3   7.6    ?Hemoglobin 12.0 - 15.0 g/dL 8.6   8.5   10.1    ?Hematocrit 36.0 - 46.0 % 29.1   28.5   33.6    ?Platelets 150 - 400 K/uL 304   323   377     ? ? ? ? ? ?Assessment and Plan: ?Ms. Rundle is a pleasant 46 y.o. 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

## 2021-11-01 NOTE — ED Notes (Signed)
Pharmacy at bedside

## 2021-11-01 NOTE — Progress Notes (Signed)
Pharmacy Antibiotic Note ? ?Jill Cox is a 46 y.o. female admitted on 10/31/2021 with osteomyelitis.  Pharmacy has been consulted for Vancomycin and Cefepime dosing. ? ?Plan: ?Cefepime 2 gm q8h per indication & renal fxn. ? ?Vancomycin ?Pt ordered total initial dose of Vancomycin 2 gm. ?Vancomycin 750 mg IV Q 8 hrs. Goal AUC 400-550. ?Expected AUC: 479.1 ?SCr used: 0.72 ? ?Pharmacy will continue to follow and will adjust abx dosing as warranted. ? ?Temp (24hrs), Avg:98.1 ?F (36.7 ?C), Min:97.9 ?F (36.6 ?C), Max:98.3 ?F (36.8 ?C) ? ?Recent Labs  ?Lab 10/31/21 ?1642  ?WBC 7.6  ?CREATININE 0.72  ?  ?Estimated Creatinine Clearance: 93.9 mL/min (by C-G formula based on SCr of 0.72 mg/dL).   ? ?No Known Allergies ? ?Antimicrobials this admission: ?4/19 Vancomycin >>  ?4/19 Cefepime >>  ? ?Microbiology results: ?4/18 BCx: Pending ? ?Thank you for allowing pharmacy to be a part of this patient?s care. ? ?Renda Rolls, PharmD, MBA ?11/01/2021 ?1:06 AM ? ? ?

## 2021-11-01 NOTE — Consult Note (Signed)
? ?Chief Complaint: ?Patient was seen in consultation today for L5-S1 discitis at the request of Tsosie Billing, MD ? ?Referring Physician(s): ?Tsosie Billing, MD ? ?Supervising Physician: Corrie Mckusick ? ?Patient Status: [facility] - In-pt ? ?History of Present Illness: ?Jill Cox is a 46 y.o. female with PMHx significant for MRSA bacteremia that affected multiple joints with epidural abscess and required incision and drainage 10/02/21 of left and right calf abscess and bilateral knee and right ankle joint, the patient has had a PICC line and had been on Daptomycin with close follow-up with infectious disease. She has recently been experiencing mid to low back pain that radiates to her left buttock, recent office visit to infectious disease without concerning labs and afebrile, however the patient's pain was worsening and so she presented to the ED.  ? ?MR imaging done 4/18 revealed abnormal findings concerning for discitis/osteomyelitis and IR received request for L5-S1 disc aspiration. The patient rates her current mid to low back pain 6/10. The patient denies any current chest pain or shortness of breath. She denies any current blood thinner use, denies any known bleeding or clotting disorder. The patient denies any fever or chills. She has no known complications to moderate sedation.  ? ? ?Past Medical History:  ?Diagnosis Date  ? Depression   ? Engages in [place]   ? PONV (postoperative nausea and vomiting)   ? ? ?Past Surgical History:  ?Procedure Laterality Date  ? CHOLECYSTECTOMY    ? I & D EXTREMITY Left 09/30/2021  ? Procedure: IRRIGATION AND DEBRIDEMENT LEFT FOREARM ABSCESS;  Surgeon: Iran Planas, MD;  Location: [facility];  Service: Orthopedics;  Laterality: Left;  ? I & D EXTREMITY Bilateral 10/02/2021  ? Procedure: IRRIGATION AND DEBRIDEMENT EXTREMITY;  Surgeon: Altamese [place], MD;  Location: [facility];  Service: Orthopedics;  Laterality: Bilateral;  arthrocentsis bilateral knees and right  ankle ?  ? TEE WITHOUT CARDIOVERSION N/A 09/26/2021  ? Procedure: TRANSESOPHAGEAL ECHOCARDIOGRAM (TEE);  Surgeon: Minna Merritts, MD;  Location: [facility];  Service: Cardiovascular;  Laterality: N/A;  ? ? ?Allergies: ?Patient has no known allergies. ? ?Medications: ?Prior to Admission medications   ?Medication Sig Start Date End Date Taking? Authorizing Provider  ?acetaminophen (TYLENOL) 325 MG tablet Take 2 tablets (650 mg total) by mouth every 6 (six) hours as needed for mild pain or fever. 09/26/21  Yes Lorella Nimrod, MD  ?cyclobenzaprine (FLEXERIL) 10 MG tablet Take 1 tablet (10 mg total) by mouth 3 (three) times daily as needed for up to 20 days for muscle spasms. 10/31/21 11/20/21 Yes Vu, Rockey Situ, MD  ?daptomycin (CUBICIN) IVPB Inject 650 mg into the vein daily. Indication:  severe MRSA sepsis ?First Dose: Yes ?Last Day of Therapy:  11/13/21 ?Labs - Once weekly:  CBC/D, BMP, and CPK ?Labs - Every other week:  ESR and CRP ?Method of administration: IV Push ?Method of administration may be changed at the discretion of home infusion pharmacist based upon assessment of the patient and/or caregiver's ability to self-administer the medication ordered. 10/06/21 11/14/21 Yes Shelly Coss, MD  ?DAPTOmycin in sodium chloride 0.9 % 50 mL Inject 650 mg into the vein. 10/06/21 11/14/21 Yes [provider]  ?ferrous sulfate 325 (65 FE) MG tablet Take 1 tablet (325 mg total) by mouth daily with breakfast. 10/07/21  Yes Shelly Coss, MD  ?Multiple Vitamin (MULTIVITAMIN WITH MINERALS) TABS tablet Take 1 tablet by mouth daily.   Yes [provider]  ?naproxen (NAPROSYN) 500 MG tablet Take 500 mg by  mouth 2 (two) times daily as needed.   Yes [provider]  ?oxyCODONE-acetaminophen (PERCOCET/ROXICET) 5-325 MG tablet Take 1 tablet by mouth every 4 (four) hours as needed for moderate pain. 10/06/21  Yes Shelly Coss, MD  ?sertraline (ZOLOFT) 25 MG tablet Take 25 mg by mouth every morning. 09/18/21  Yes  [provider]  ?feeding supplement (ENSURE ENLIVE / ENSURE PLUS) LIQD Take 237 mLs by mouth 2 (two) times daily between meals. 10/06/21   Shelly Coss, MD  ?lidocaine (XYLOCAINE) 5 % ointment Apply 1 application. topically 4 (four) times daily as needed. ?Patient not taking: Reported on 11/01/2021 10/10/21   Prudencio Pair T, MD  ?potassium chloride SA (KLOR-CON M) 20 MEQ tablet Take 2 tablets (40 mEq total) by mouth daily. ?Patient not taking: Reported on 11/01/2021 10/07/21   Shelly Coss, MD  ?  ? ?Family History  ?Problem Relation Age of Onset  ? Heart disease Mother   ? Heart disease Father   ? ? ?Social History  ? ?Socioeconomic History  ? Marital status: Married  ?  Spouse name: Not on file  ? Number of children: Not on file  ? Years of education: Not on file  ? Highest education level: Not on file  ?Occupational History  ? Not on file  ?Tobacco Use  ? Smoking status: Never  ? Smokeless tobacco: Never  ? Tobacco comments:  ?  Pt is doing vaping  ?Substance and Sexual Activity  ? Alcohol use: Not Currently  ? Drug use: Never  ? Sexual activity: Not on file  ?Other Topics Concern  ? Not on file  ?Social History Narrative  ? Not on file  ? ?Social Determinants of Health  ? ?Financial Resource Strain: Not on file  ?Food Insecurity: Not on file  ?Transportation Needs: Not on file  ?Physical Activity: Not on file  ?Stress: Not on file  ?Social Connections: Not on file  ? ? ?Review of Systems: A 12 point ROS discussed and pertinent positives are indicated in the HPI above.  All other systems are negative. ? ?Review of Systems ? ?Vital Signs: ?BP (!) 119/55 (BP Location: Left Arm)   Pulse 70   Temp 98.9 ?F (37.2 ?C)   Resp 18   LMP  (LMP Unknown)   SpO2 100%  ? ?Physical Exam ?Constitutional:   ?   General: She is not in acute distress. ?HENT:  ?   Head: Normocephalic and atraumatic.  ?Cardiovascular:  ?   Rate and Rhythm: Normal rate.  ?Pulmonary:  ?   Effort: Pulmonary effort is normal. No respiratory  distress.  ?Neurological:  ?   Mental Status: She is alert and oriented to person, place, and time.  ? ? ?Imaging: ?MR THORACIC SPINE W WO CONTRAST ? ?Result Date: 10/31/2021 ?CLINICAL DATA:  Worsening back pain, recent epidural abscess; low back pain, recent thoracic epidural abscess, worsening low back pain EXAM: MRI THORACIC AND LUMBAR SPINE WITHOUT AND WITH CONTRAST TECHNIQUE: Multiplanar and multiecho pulse sequences of the thoracic and lumbar spine were obtained without and with intravenous contrast. CONTRAST:  61m GADAVIST GADOBUTROL 1 MMOL/ML IV SOLN COMPARISON:  MRI thoracic spine 09/26/2021, MRI lumbar spine 09/28/2021 FINDINGS: MRI THORACIC SPINE FINDINGS Alignment:  Physiologic. Vertebrae: Minimal persistent periarticular marrow edema along the right T2-T3 facet with persistent mild facet effusion and adjacent soft tissue enhancement. Generalized low T1 marrow signal. Cord: No abnormal cord signal. Decreased mass effect on the upper thoracic spine thecal sac. Paraspinal and other soft  tissues: Adjacent soft tissue edema and enhancement on the right adjacent to the T2-T3 facet, decreased from the prior exam. Disc levels: Decreased dorsal epidural phlegmon of the upper thoracic spine, now measuring 2 mm in thickness, previously up to 5 mm, extending from the level of T2-T3 superiorly beyond the field of view into the cervical spine. MRI LUMBAR SPINE FINDINGS Segmentation:  Standard. Alignment:  Physiologic. Vertebrae: There is an in T2 hyperintense L5-S1 intervertebral disc with marrow edema and enhancement of the L5 vertebral body, inferior endplate irregularity, and mild enhancement of the superior endplate of S1. Left-sided facet effusion at L2-L3 with adjacent mild marrow edema of the inferior articular process of L2. Conus medullaris and cauda equina: Conus extends to the L1 level. Conus and cauda equina appear normal. Paraspinal and other soft tissues: There is paraspinal soft tissue edema centered at  L5-S1. See below for description of paraspinal changes at L2-L3. Disc levels: Left-sided facet effusion at L2-L3 with adjacent soft tissue edema and enhancement. There is soft tissue phlegmon/developing abscess in the

## 2021-11-02 ENCOUNTER — Inpatient Hospital Stay: Payer: 59 | Admitting: Anesthesiology

## 2021-11-02 ENCOUNTER — Inpatient Hospital Stay: Payer: 59 | Admitting: Radiology

## 2021-11-02 ENCOUNTER — Encounter: Payer: Self-pay | Admitting: Internal Medicine

## 2021-11-02 ENCOUNTER — Encounter
Admission: EM | Disposition: A | Discharge status: Home Health | Payer: Self-pay | Source: Home / Self Care | Attending: Osteopathic Medicine

## 2021-11-02 ENCOUNTER — Inpatient Hospital Stay: Payer: 59

## 2021-11-02 ENCOUNTER — Other Ambulatory Visit: Payer: Self-pay

## 2021-11-02 ENCOUNTER — Ambulatory Visit: Payer: 59 | Admitting: Physical Therapy

## 2021-11-02 ENCOUNTER — Other Ambulatory Visit: Payer: Self-pay | Admitting: Neurosurgery

## 2021-11-02 DIAGNOSIS — L02419 Cutaneous abscess of limb, unspecified: Secondary | ICD-10-CM | POA: Diagnosis not present

## 2021-11-02 DIAGNOSIS — R7881 Bacteremia: Secondary | ICD-10-CM | POA: Diagnosis not present

## 2021-11-02 DIAGNOSIS — L02416 Cutaneous abscess of left lower limb: Secondary | ICD-10-CM | POA: Diagnosis not present

## 2021-11-02 DIAGNOSIS — M549 Dorsalgia, unspecified: Secondary | ICD-10-CM | POA: Diagnosis not present

## 2021-11-02 DIAGNOSIS — B9562 Methicillin resistant Staphylococcus aureus infection as the cause of diseases classified elsewhere: Secondary | ICD-10-CM | POA: Diagnosis not present

## 2021-11-02 DIAGNOSIS — M4647 Discitis, unspecified, lumbosacral region: Secondary | ICD-10-CM | POA: Diagnosis not present

## 2021-11-02 HISTORY — PX: INCISION AND DRAINAGE OF WOUND: SHX1803

## 2021-11-02 LAB — ANA COMPREHENSIVE PANEL
Anti JO-1: <0.2 AI (ref 0.0–0.9)
Centromere Ab Screen: <0.2 AI (ref 0.0–0.9)
Chromatin Ab SerPl-aCnc: <0.2 AI (ref 0.0–0.9)
ENA SM Ab Ser-aCnc: <0.2 AI (ref 0.0–0.9)
Ribonucleic Protein: <0.2 AI (ref 0.0–0.9)
SSA (Ro) (ENA) Antibody, IgG: 0.2 AI (ref 0.0–0.9)
SSB (La) (ENA) Antibody, IgG: <0.2 AI (ref 0.0–0.9)
Scleroderma (Scl-70) (ENA) Antibody, IgG: <0.2 AI (ref 0.0–0.9)
ds DNA Ab: 1 IU/mL (ref 0–9)

## 2021-11-02 LAB — BLOOD CULTURE ID PANEL (REFLEXED) - BCID2

## 2021-11-02 LAB — VANCOMYCIN, PEAK: Vancomycin Pk: 21 ug/mL — ABNORMAL LOW (ref 30–40)

## 2021-11-02 LAB — CREATININE, SERUM
Creatinine, Ser: 0.66 mg/dL (ref 0.44–1.00)
GFR, Estimated: >60 mL/min (ref >60)

## 2021-11-02 LAB — RHEUMATOID FACTOR: Rheumatoid fact SerPl-aCnc: 10.2 IU/mL (ref <14.0)

## 2021-11-02 LAB — ROCKY MTN SPOTTED FVR ABS PNL(IGG+IGM)
RMSF IgG: NEGATIVE
RMSF IgM: 0.35 index (ref 0.00–0.89)

## 2021-11-02 LAB — PROTIME-INR
INR: 1.2 (ref 0.8–1.2)
Prothrombin Time: 15 seconds (ref 11.4–15.2)

## 2021-11-02 LAB — LYME DISEASE SEROLOGY W/REFLEX: Lyme Total Antibody EIA: NEGATIVE

## 2021-11-02 LAB — ALDOLASE: Aldolase: 2.9 U/L — ABNORMAL LOW (ref 3.3–10.3)

## 2021-11-02 SURGERY — IRRIGATION AND DEBRIDEMENT WOUND
Anesthesia: General | Laterality: Bilateral

## 2021-11-02 MED ORDER — OXYCODONE HCL 5 MG PO TABS: 5.0000 mg | ORAL_TABLET | Freq: Once | ORAL | Status: DC | PRN

## 2021-11-02 MED ORDER — ONDANSETRON HCL 4 MG PO TABS: 4.0000 mg | ORAL_TABLET | Freq: Four times a day (QID) | ORAL | Status: DC | PRN

## 2021-11-02 MED ORDER — ACETAMINOPHEN 10 MG/ML IV SOLN
INTRAVENOUS | Status: DC | PRN
Start: 2021-11-02 — End: 2021-11-02
  Administered 2021-11-02: 1000 mg via INTRAVENOUS

## 2021-11-02 MED ORDER — DEXAMETHASONE SODIUM PHOSPHATE 10 MG/ML IJ SOLN
INTRAMUSCULAR | Status: DC | PRN
  Administered 2021-11-02: 10 mg via INTRAVENOUS

## 2021-11-02 MED ORDER — FENTANYL CITRATE (PF) 100 MCG/2ML IJ SOLN: 25.0000 ug | INTRAMUSCULAR | Status: DC | PRN

## 2021-11-02 MED ORDER — ZOLPIDEM TARTRATE 5 MG PO TABS: 5.0000 mg | ORAL_TABLET | Freq: Every evening | ORAL | Status: DC | PRN

## 2021-11-02 MED ORDER — ACETAMINOPHEN 10 MG/ML IV SOLN
INTRAVENOUS | Status: AC
  Filled 2021-11-02: qty 100

## 2021-11-02 MED ORDER — ONDANSETRON HCL 4 MG/2ML IJ SOLN: 4.0000 mg | Freq: Four times a day (QID) | INTRAMUSCULAR | Status: DC | PRN

## 2021-11-02 MED ORDER — METHOCARBAMOL 500 MG PO TABS
500.0000 mg | ORAL_TABLET | Freq: Four times a day (QID) | ORAL | Status: DC | PRN
  Administered 2021-11-05 – 2021-11-08 (×4): 500 mg via ORAL
  Filled 2021-11-02 (×7): qty 1

## 2021-11-02 MED ORDER — METOCLOPRAMIDE HCL 10 MG PO TABS
5.0000 mg | ORAL_TABLET | Freq: Three times a day (TID) | ORAL | Status: DC | PRN
  Filled 2021-11-02: qty 1

## 2021-11-02 MED ORDER — CYCLOBENZAPRINE HCL 10 MG PO TABS
10.0000 mg | ORAL_TABLET | Freq: Three times a day (TID) | ORAL | Status: DC | PRN
  Administered 2021-11-02 – 2021-11-10 (×18): 10 mg via ORAL
  Filled 2021-11-02 (×19): qty 1

## 2021-11-02 MED ORDER — FENTANYL CITRATE (PF) 100 MCG/2ML IJ SOLN
INTRAMUSCULAR | Status: DC | PRN
Start: 2021-11-02 — End: 2021-11-02
  Administered 2021-11-02 (×2): 50 ug via INTRAVENOUS

## 2021-11-02 MED ORDER — FERROUS SULFATE 325 (65 FE) MG PO TABS
325.0000 mg | ORAL_TABLET | Freq: Every day | ORAL | Status: DC
  Administered 2021-11-03 – 2021-11-12 (×8): 325 mg via ORAL
  Filled 2021-11-02 (×9): qty 1

## 2021-11-02 MED ORDER — PROPOFOL 500 MG/50ML IV EMUL
INTRAVENOUS | Status: DC | PRN
  Administered 2021-11-02: 200 ug/kg/min via INTRAVENOUS

## 2021-11-02 MED ORDER — DOCUSATE SODIUM 100 MG PO CAPS
100.0000 mg | ORAL_CAPSULE | Freq: Two times a day (BID) | ORAL | Status: DC
  Administered 2021-11-02 – 2021-11-12 (×19): 100 mg via ORAL
  Filled 2021-11-02 (×19): qty 1

## 2021-11-02 MED ORDER — MORPHINE SULFATE (PF) 2 MG/ML IV SOLN: 0.5000 mg | INTRAVENOUS | Status: DC | PRN

## 2021-11-02 MED ORDER — MIDAZOLAM HCL 2 MG/2ML IJ SOLN
INTRAMUSCULAR | Status: DC | PRN
  Administered 2021-11-02: 2 mg via INTRAVENOUS

## 2021-11-02 MED ORDER — METHOCARBAMOL 1000 MG/10ML IJ SOLN
500.0000 mg | Freq: Four times a day (QID) | INTRAVENOUS | Status: DC | PRN
  Filled 2021-11-02: qty 5

## 2021-11-02 MED ORDER — LACTATED RINGERS IR SOLN
Status: DC | PRN
  Administered 2021-11-02: 3000 mL

## 2021-11-02 MED ORDER — FENTANYL CITRATE (PF) 100 MCG/2ML IJ SOLN
INTRAMUSCULAR | Status: AC
Start: 2021-11-02 — End: ?
  Filled 2021-11-02: qty 2

## 2021-11-02 MED ORDER — ONDANSETRON HCL 4 MG/2ML IJ SOLN
INTRAMUSCULAR | Status: DC | PRN
  Administered 2021-11-02: 4 mg via INTRAVENOUS

## 2021-11-02 MED ORDER — FENTANYL CITRATE (PF) 100 MCG/2ML IJ SOLN
INTRAMUSCULAR | Status: AC
  Filled 2021-11-02: qty 2

## 2021-11-02 MED ORDER — OXYCODONE HCL 5 MG/5ML PO SOLN: 5.0000 mg | Freq: Once | ORAL | Status: DC | PRN

## 2021-11-02 MED ORDER — METOCLOPRAMIDE HCL 5 MG/ML IJ SOLN: 5.0000 mg | Freq: Three times a day (TID) | INTRAMUSCULAR | Status: DC | PRN

## 2021-11-02 MED ORDER — ENSURE ENLIVE PO LIQD
237.0000 mL | Freq: Two times a day (BID) | ORAL | Status: DC
  Administered 2021-11-04 – 2021-11-11 (×6): 237 mL via ORAL

## 2021-11-02 MED ORDER — PROPOFOL 10 MG/ML IV BOLUS
INTRAVENOUS | Status: DC | PRN
  Administered 2021-11-02: 120 mg via INTRAVENOUS

## 2021-11-02 MED ORDER — LIDOCAINE HCL (CARDIAC) PF 100 MG/5ML IV SOSY
PREFILLED_SYRINGE | INTRAVENOUS | Status: DC | PRN
  Administered 2021-11-02: 80 mg via INTRAVENOUS

## 2021-11-02 MED ORDER — LACTATED RINGERS IV SOLN: INTRAVENOUS | Status: DC | PRN

## 2021-11-02 MED ORDER — MIDAZOLAM HCL 2 MG/2ML IJ SOLN
INTRAMUSCULAR | Status: AC
Start: 2021-11-02 — End: ?
  Filled 2021-11-02: qty 2

## 2021-11-02 MED ORDER — SODIUM CHLORIDE 0.9 % IV SOLN
600.0000 mg | Freq: Three times a day (TID) | INTRAVENOUS | Status: DC
  Administered 2021-11-02 – 2021-11-09 (×20): 600 mg via INTRAVENOUS
  Filled 2021-11-02 (×23): qty 20

## 2021-11-02 SURGICAL SUPPLY — 53 items
CANISTER WOUND CARE 500ML ATS (WOUND CARE) ×4 IMPLANT
CHLORAPREP W/TINT 26 (MISCELLANEOUS) ×2 IMPLANT
CONNECTOR Y WND VAC (MISCELLANEOUS) IMPLANT
DRAPE 3/4 80X56 (DRAPES) ×2 IMPLANT
DRAPE INCISE IOBAN 66X60 STRL (DRAPES) ×2 IMPLANT
DRSG EMULSION OIL 3X8 NADH (GAUZE/BANDAGES/DRESSINGS) ×2 IMPLANT
DRSG VAC ATS LRG SENSATRAC (GAUZE/BANDAGES/DRESSINGS) ×1 IMPLANT
DRSG VAC ATS MED SENSATRAC (GAUZE/BANDAGES/DRESSINGS) ×1 IMPLANT
DRSG VERSA FOAM LRG 10X15 (GAUZE/BANDAGES/DRESSINGS) ×3 IMPLANT
ELECT CAUTERY BLADE 6.4 (BLADE) ×2 IMPLANT
ELECT REM PT RETURN 9FT ADLT (ELECTROSURGICAL) ×2
ELECTRODE REM PT RTRN 9FT ADLT (ELECTROSURGICAL) ×1 IMPLANT
GAUZE 4X4 16PLY ~~LOC~~+RFID DBL (SPONGE) ×2 IMPLANT
GAUZE SPONGE 4X4 12PLY STRL (GAUZE/BANDAGES/DRESSINGS) ×2 IMPLANT
GAUZE XEROFORM 1X8 LF (GAUZE/BANDAGES/DRESSINGS) ×4 IMPLANT
GLOVE SURG SYN 9.0  PF PI (GLOVE) ×1
GLOVE SURG SYN 9.0 PF PI (GLOVE) ×1 IMPLANT
GOWN SRG 2XL LVL 4 RGLN SLV (GOWNS) ×1 IMPLANT
GOWN STRL NON-REIN 2XL LVL4 (GOWNS) ×1
GOWN STRL REUS W/ TWL LRG LVL3 (GOWN DISPOSABLE) ×1 IMPLANT
GOWN STRL REUS W/TWL LRG LVL3 (GOWN DISPOSABLE) ×1
HEMOVAC 400ML (MISCELLANEOUS) ×2
HOLSTER ELECTROSUGICAL PENCIL (MISCELLANEOUS) ×2 IMPLANT
KIT DRAIN HEMOVAC JP 7FR 400ML (MISCELLANEOUS) ×1 IMPLANT
KIT TURNOVER KIT A (KITS) ×2 IMPLANT
MANIFOLD NEPTUNE II (INSTRUMENTS) ×2 IMPLANT
NDL FILTER BLUNT 18X1 1/2 (NEEDLE) ×1 IMPLANT
NDL SAFETY ECLIPSE 18X1.5 (NEEDLE) ×1 IMPLANT
NEEDLE FILTER BLUNT 18X 1/2SAF (NEEDLE) ×1
NEEDLE FILTER BLUNT 18X1 1/2 (NEEDLE) ×1 IMPLANT
NEEDLE HYPO 18GX1.5 SHARP (NEEDLE) ×1
NS IRRIG 1000ML POUR BTL (IV SOLUTION) ×2 IMPLANT
PACK EXTREMITY ARMC (MISCELLANEOUS) ×1 IMPLANT
PACK HIP PROSTHESIS (MISCELLANEOUS) ×1 IMPLANT
PAD NEG PRESSURE SENSATRAC (MISCELLANEOUS) ×3 IMPLANT
PULSAVAC PLUS IRRIG FAN TIP (DISPOSABLE)
SCALPEL PROTECTED #10 DISP (BLADE) ×4 IMPLANT
SPONGE T-LAP 18X18 ~~LOC~~+RFID (SPONGE) ×8 IMPLANT
STAPLER SKIN PROX 35W (STAPLE) ×2 IMPLANT
SUT ETHIBOND #5 BRAIDED 30INL (SUTURE) ×1 IMPLANT
SUT TICRON 2-0 30IN 311381 (SUTURE) ×3 IMPLANT
SUT VIC AB 0 CT1 27 (SUTURE)
SUT VIC AB 0 CT1 27XCR 8 STRN (SUTURE) ×2 IMPLANT
SUT VIC AB 1 CTX 27 (SUTURE) ×2 IMPLANT
SUT VIC AB 2-0 CT1 27 (SUTURE)
SUT VIC AB 2-0 CT1 TAPERPNT 27 (SUTURE) ×1 IMPLANT
SWAB CULTURE AMIES ANAERIB BLU (MISCELLANEOUS) ×2 IMPLANT
SYR 10ML LL (SYRINGE) ×2 IMPLANT
TAPE MICROFOAM 4IN (TAPE) ×2 IMPLANT
TIP BRUSH PULSAVAC PLUS 24.33 (MISCELLANEOUS) ×2 IMPLANT
TIP FAN IRRIG PULSAVAC PLUS (DISPOSABLE) ×1 IMPLANT
WATER STERILE IRR 500ML POUR (IV SOLUTION) ×2 IMPLANT
WND VAC CONN Y (MISCELLANEOUS) ×1

## 2021-11-02 NOTE — Transfer of Care (Signed)
Immediate Anesthesia Transfer of Care Note ? ?Patient: Jill Cox ? ?Procedure(s) Performed: IRRIGATION AND DEBRIDEMENT WOUND LEFT THIGH, AND RIGHT CALF (Bilateral) ? ?Patient Location: PACU ? ?Anesthesia Type:General ? ?Level of Consciousness: sedated ? ?Airway & Oxygen Therapy: Patient Spontanous Breathing and Patient connected to face mask oxygen ? ?Post-op Assessment: Report given to RN and Post -op Vital signs reviewed and stable ? ?Post vital signs: Reviewed and stable ? ?Last Vitals:  ?Vitals Value Taken Time  ?BP    ?Temp    ?Pulse    ?Resp    ?SpO2    ? ? ?Last Pain:  ?Vitals:  ? 11/02/21 1438  ?TempSrc: Oral  ?PainSc: 8   ?   ? ?Patients Stated Pain Goal: 4 (11/02/21 0537) ? ?Complications: No notable events documented. ?

## 2021-11-02 NOTE — Progress Notes (Signed)
PHARMACY - PHYSICIAN COMMUNICATION ?CRITICAL VALUE ALERT - BLOOD CULTURE IDENTIFICATION (BCID) ? ?BCID results: 1 of 4 bottles growing Staph aureus (MRSA), with Meth resistant mecA/C and MREJ.  Pt already being followed by Dr. Delaine Lame and is on Vancomycin, so no changes needed. ? ?Name of provider contacted: Morton Amy, NP ? ?Changes to prescribed antibiotics required: None ? ?Renda Rolls, PharmD, MBA ?11/02/2021 ?1:20 AM ? ? ?

## 2021-11-02 NOTE — Progress Notes (Signed)
? ?Date of Admission:  10/31/2021    ? ? ?ID: Jill Cox is a 46 y.o. female    ?Principal Problem: ?  Back pain ?Active Problems: ?  MRSA bacteremia ?  Polyarthritis ?  Depression ?  Anemia ?  OM (osteomyelitis) ([place]) ?  Discitis ? ? ?I fist saw patient in PACU and examined her there  ?Then came to her room 130 where her husband was also present ?Discussed the current condition, change of plans this morning and why we did not go for disc aspiration ? ? ?Jill Cox is a 46 y.o. 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  ?3/18  forearm I/D and 4th dorsal compartment extensor tenosynovectomy  culture positive for MRSA ?3/20 aspiration  rt knee- No mrsa ?3/20 Aspiration RT ankle - MRSA ?3/20 Rt calf- MRSA ?3/20 Aspiration left left Knee -No MRSA ?3/20 Left thigh abscess- -I/D MRSA ?  ?She also had extensive vertebral involvement ?Rt facet  joint involvement  at T2-T3 /T3-T4  ?Cervical spine- dorsal epidural collection of 5 mm extending C1-C7 with moderate stenosis at C4-5, C5-6, and C6-7 level ?  ?Lumbar MRI done without contrast on 09/28/21 collection at the left aspect of the thecal sac at L5, measuring approximately 10 x 11 x 23 ?Mm. L4-L5 disc involvement was questioned ?  ?  ?She was initially treated with dual MRSA antibiotic with ceftaroline + Dapto and then discharged on dapto to complete 6 weeks until 11/13/21 ?After she went home she was doing fine for a week and then started doing stretching and started noticing pain left lumbar area. ?Also the rt calf I/D site had some foul smell with discharge after the sutures were removed. ?She  also feels the left thigh site which has healed has pain and fullness. ?She saw ID physician yesterday at [facility] ?And his plan was to continue IV as planned  ? ? ?Subjective: ?Pt underwent b/l leg abscess I/D today ?Has wound vac ?Feeling better in her legs ?lighter ? ? ?Medications:  ? Chlorhexidine Gluconate Cloth  6 each Topical Daily  ? docusate sodium  100 mg Oral BID  ? [START ON 11/03/2021] feeding supplement  237 mL Oral BID BM  ? [START ON 11/03/2021] ferrous sulfate  325 mg Oral Q breakfast  ? nicotine  7 mg Transdermal Daily  ? ondansetron (ZOFRAN) IV  4 mg Intravenous Q8H  ? sertraline  25 mg Oral Daily  ? ? ?Objective: ?Vital signs in last 24 hours: ?Temp:  [97.6 ?F (36.4 ?C)-98.6 ?F (37 ?C)] 97.6 ?F (36.4 ?C) (04/20 1722) ?Pulse Rate:  [49-85] 60 (04/20 1722) ?Resp:  [15-17] 16 (04/20 1722) ?BP: (86-139)/(40-73) 118/53 (04/20 1722) ?SpO2:  [96 %-100 %] 100 % (04/20 1722) ? ?PHYSICAL EXAM:  ?General: Alert, cooperative, no distress, appears stated age.  ?Head: Normocephalic, without obvious abnormality, atraumatic. ?Neck:  symmetrical, no adenopathy, thyroid:  ? ?Lungs: b/la air entry ?Heart: Regular rate and rhythm, no murmur, rub or gallop. ?Abdomen: Soft, non-tender,not distended. Bowel sounds normal. No masses ?Extremities:rt calf- I/D site wound vac ?Left thigh I/D site wound vac ?RT PICC removed ?Skin: No rashes or lesions. Or bruising ?Lymph:  Cervical, supraclavicular normal. ?Neurologic: Grossly non-focal ? ?Lab Results ?Recent Labs  ?  10/31/21 ?1642 11/01/21 ?0108 11/01/21 ?0415 11/02/21 ?0540  ?WBC 7.6 7.3 7.0  --   ?HGB 10.1* 8.5* 8.6*  --   ?HCT 33.6* 28.5* 29.1*  --   ?NA 138  --  136  --   ?K 4.1  --  3.7  --   ?CL 103  --  105  --   ?CO2 24  --  24  --   ?BUN 19  --  12  --   ?CREATININE 0.72 0.64 0.52 0.66  ? ?Liver Panel ?Recent Labs  ?  10/31/21 ?1642 11/01/21 ?0415  ?PROT 8.7* 7.5  ?ALBUMIN 3.7 3.3*  ?AST 19 15  ?ALT 13 12  ?ALKPHOS 70 58  ?BILITOT 0.5 0.7  ? ?Sedimentation Rate ?Recent  Labs  ?  10/31/21 ?2323  ?ESRSEDRATE 79*  ? ?C-Reactive Protein ?Recent Labs  ?  10/31/21 ?2323  ?CRP 8.3*  ? ? 3/11, 3/12, 3/13 and 3/15 BC- MRSA ?3/16,3/17, 3/19  BC NG ?Microbiology: ?[facility] 4/18 MRSA from PICC site ?Rt calf wound culture sent ? ?Studies/Results: ?MR THORACIC SPINE W WO CONTRAST ? ?Result Date: 10/31/2021 ?CLINICAL DATA:  Worsening back pain, recent epidural abscess; low back pain, recent thoracic epidural abscess, worsening low back pain EXAM: MRI THORACIC AND LUMBAR SPINE WITHOUT AND WITH CONTRAST TECHNIQUE: Multiplanar and multiecho pulse sequences of the thoracic and lumbar spine were obtained without and with intravenous contrast. CONTRAST:  12mL GADAVIST GADOBUTROL 1 MMOL/ML IV SOLN COMPARISON:  MRI thoracic spine 09/26/2021, MRI lumbar spine 09/28/2021 FINDINGS: MRI THORACIC SPINE FINDINGS Alignment:  Physiologic. Vertebrae: Minimal persistent periarticular marrow edema along the right T2-T3 facet with persistent mild facet effusion and adjacent soft tissue enhancement. Generalized low T1 marrow signal. Cord: No abnormal cord signal. Decreased mass effect on the upper thoracic spine thecal sac. Paraspinal and other soft tissues: Adjacent soft tissue edema and enhancement on the right adjacent to the T2-T3 facet, decreased from the prior exam. Disc levels: Decreased dorsal epidural phlegmon of the upper thoracic spine, now measuring 2 mm in thickness, previously up to 5 mm, extending from the level of T2-T3 superiorly beyond the field of view into the cervical spine. MRI LUMBAR SPINE FINDINGS Segmentation:  Standard. Alignment:  Physiologic. Vertebrae: There is an in T2 hyperintense L5-S1 intervertebral disc with marrow edema and enhancement of the L5 vertebral body, inferior endplate irregularity, and mild enhancement of the superior endplate of S1. Left-sided facet effusion at L2-L3 with adjacent mild marrow edema of the inferior articular process of L2. Conus medullaris and cauda equina: Conus  extends to the L1 level. Conus and cauda equina appear normal. Paraspinal and other soft tissues: There is paraspinal soft tissue edema centered at L5-S1. See below for description of paraspinal changes at L2-L3. Disc levels: Left-sided facet effusion at L2-L3 with adjacent soft tissue edema and enhancement. There is soft tissue phlegmon/developing abscess in the left paraspinal soft tissues measuring 1.7 x 1.0 cm (sagittal stir image 11). These paraspinal changes were present but less conspicuous on the prior exam given lack of intravenous contrast at that time. At L5-S1, there is a T2 hyperintense disc with disc height loss, and eccentric left disc bulging. There is a heterogeneous, enhancing collection along the left aspect of the thecal sac extending from the ventral to dorsal aspect, which tracks inferiorly along the left S1 nerve root (axial T2 image 34, axial postcontrast image 34, axial T1 images 33-37).  This collection is overall similar in comparison to prior exam in March, measuring 1.7 x 1.7 cm in the axial dimension at the level of L5-S1, previously 1.7 x 1.7 cm (axial postcontrast image 34). IMPRESSION: Discitis-osteomyelitis at L5-S1 with epidural phlegmon along the left aspect of the thecal sac extending from the ventral to dorsal aspect, unchanged in size from prior. Phlegmon tracks along the descending left S1 nerve root. Adjacent paraspinal soft tissue inflammation. Septic left L2-L3 facet arthritis with adjacent marrow edema in the inferior articular process of L2, adjacent left-sided paraspinal soft tissue inflammation with focal phlegmon/developing abscess measuring 1.7 x 1.0 cm. Electronically Signed   By: Maurine Simmering M.D.   On: 10/31/2021 22:04  ? ?MR Lumbar Spine W Wo Contrast ? ?Result Date: 10/31/2021 ?CLINICAL DATA:  Worsening back pain, recent epidural abscess; low back pain, recent thoracic epidural abscess, worsening low back pain EXAM: MRI THORACIC AND LUMBAR SPINE WITHOUT AND WITH  CONTRAST TECHNIQUE: Multiplanar and multiecho pulse sequences of the thoracic and lumbar spine were obtained without and with intravenous contrast. CONTRAST:  31mL GADAVIST GADOBUTROL 1 MMOL/ML IV SOLN C

## 2021-11-02 NOTE — Progress Notes (Signed)
Patient stated Dr. Cari Caraway gave permission that she could eat breakfast between 0600-0630 am 11/03/21 then NPO after that for procedure planned for 1500. ?

## 2021-11-02 NOTE — Progress Notes (Signed)
?PROGRESS NOTE ? ? ? Jill Cox  [id number] DOB: 12-Jan-1976 DOA: 10/31/2021 ?PCP: Bubba Camp, [place]  ? ? ?Brief Narrative:  ?46 year old with recent extensive hospitalization for MRSA bacteremia, multiple septic joints, epidural abscess treated conservatively and currently on IV daptomycin at home through 5/1 with a PICC line.  Followed up at ID clinic yesterday and deemed stable to complete antibiotics presented back to the ER after going home from clinic with worsening back pain. ?All cultures were cleared.  She was on IV daptomycin.   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 Admitted due to worsening back pain and suspected not relieving epidural abscess. ? ? ?Assessment & Plan: ?  ?MRSA bacteremia, multiple bone and joint infection, epidural abscess: ?Blood cultures 3/12, 3/13, 3/15 positive for MRSA. ?Blood cultures 3/16, 3/20, negative.  Discharged home on daptomycin.  Multiple surgical procedures as below. ?Admission 4/18 ?Blood cultures 4/18. 1/2 bottles positive for MRSA. ? ?Currently on vancomycin.  Followed by infectious disease. ?Source control, ?Persistent epidural abscess, followed by neurosurgery, will probably need washout/debridement. ?Found to have remaining abscess left thigh and right calf, discussed with orthopedics for washout and debridement. ?Remove PICC line. ?Further management as per ID. ?Last admission, TEE was negative.  She may need repeat TEE due to persistent metastatic MRSA infection.  Will discuss with ID.  She may need source control with spinal and leg abscess drained before doing repeat cultures and TEE. ? ?Adequate pain medications.  Mobility to continue. ? ?Smoker: Counseled to quit.  Currently on nicotine. ? ? ? ? ?DVT prophylaxis:   SCDs.  Scheduled for procedures. ? ? ?Code Status: Full code ?Family Communication: None at bedside ?Disposition Plan: Status is:  Inpatient ?Remains inpatient appropriate because: Significant infection, persistent MRSA infection. ?  ? ? ?Consultants:  ?Infectious disease ?Neurosurgery ?Orthopedics ?Interventional radiology ? ?Procedures:  ?None today ? ?Antimicrobials:  ?Vancomycin ? ? ?Subjective: ? ?Seen in morning rounds.  Complained of back pain and sometimes unable to bear weight standing.  Remains afebrile.  Denies any chest pain or shortness of breath.  Leg pain and swelling is better than before. ?Results were updated about persistent positive cultures. ? ?Objective: ?Vitals:  ? 11/01/21 2117 11/01/21 2308 11/02/21 0531 11/02/21 0740  ?BP: 131/67 130/68 (!) 122/56 122/60  ?Pulse: 85 72 62 74  ?Resp: 16 17 16 16   ?Temp: 98.5 ?F (36.9 ?C) 97.6 ?F (36.4 ?C) 98.1 ?F (36.7 ?C) 98.3 ?F (36.8 ?C)  ?TempSrc: Oral Oral Oral Oral  ?SpO2: 96% 99% 98% 99%  ?Weight:      ?Height:      ? ? ?Intake/Output Summary (Last 24 hours) at 11/02/2021 1332 ?Last data filed at 11/01/2021 2300 ?Gross per 24 hour  ?Intake 150 ml  ?Output --  ?Net 150 ml  ? ?Filed Weights  ? 11/01/21 1815  ?Weight: 82.1 kg  ? ? ?Examination: ? ?General exam: Appears calm and comfortable at rest.  Appropriately anxious.  Mild distress on mobility. ?Respiratory system: Clear to auscultation. Respiratory effort normal.  No added sounds. ?Cardiovascular system: S1 & S2 heard, RRR. No pedal edema. ?Gastrointestinal system: Soft.  Nontender.  Bowel sound present.   ?Central nervous system: Alert and oriented. No focal neurological deficits. ?Mild tenderness on the lower back, no visible swelling or deformity. ?Extremities: Symmetric 5 x 5 power. ?Skin:  ?Left thigh with surgical scar, surrounding induration and swelling, no redness or  erythema. ?Right medial calf with mild swelling, no fluctuation, minimally soaked dressing. ?PICC line present right AC. ?Surgical scar on wrist is nontender. ? ?Data Reviewed: I have personally reviewed following labs and imaging studies ? ?CBC: ?Recent  Labs  ?Lab 10/31/21 ?1642 11/01/21 ?0108 11/01/21 ?0415  ?WBC 7.6 7.3 7.0  ?NEUTROABS 5.5  --   --   ?HGB 10.1* 8.5* 8.6*  ?HCT 33.6* 28.5* 29.1*  ?MCV 88.0 88.8 88.4  ?PLT 377 323 304  ? ?Basic Metabolic Panel: ?Recent Labs  ?Lab 10/31/21 ?1642 11/01/21 ?0108 11/01/21 ?0415 11/02/21 ?0540  ?NA 138  --  136  --   ?K 4.1  --  3.7  --   ?CL 103  --  105  --   ?CO2 24  --  24  --   ?GLUCOSE 90  --  114*  --   ?BUN 19  --  12  --   ?CREATININE 0.72 0.64 0.52 0.66  ?CALCIUM 9.3  --  8.6*  --   ? ?GFR: ?Estimated Creatinine Clearance: 93.9 mL/min (by C-G formula based on SCr of 0.66 mg/dL). ?Liver Function Tests: ?Recent Labs  ?Lab 10/31/21 ?1642 11/01/21 ?0415  ?AST 19 15  ?ALT 13 12  ?ALKPHOS 70 58  ?BILITOT 0.5 0.7  ?PROT 8.7* 7.5  ?ALBUMIN 3.7 3.3*  ? ?No results for input(s): LIPASE, AMYLASE in the last 168 hours. ?No results for input(s): AMMONIA in the last 168 hours. ?Coagulation Profile: ?Recent Labs  ?Lab 11/02/21 ?0540  ?INR 1.2  ? ?Cardiac Enzymes: ?Recent Labs  ?Lab 11/01/21 ?0108  ?CKTOTAL 15*  ? ?BNP (last 3 results) ?No results for input(s): PROBNP in the last 8760 hours. ?HbA1C: ?Recent Labs  ?  11/01/21 ?0108  ?HGBA1C 5.3  ? ?CBG: ?No results for input(s): GLUCAP in the last 168 hours. ?Lipid Profile: ?No results for input(s): CHOL, HDL, LDLCALC, TRIG, CHOLHDL, LDLDIRECT in the last 72 hours. ?Thyroid Function Tests: ?Recent Labs  ?  11/01/21 ?0108  ?TSH 2.517  ?FREET4 0.67  ? ?Anemia Panel: ?Recent Labs  ?  11/01/21 ?0108 11/01/21 ?0415  ?[id number]  --  740  ?FOLATE 34.0  --   ?FERRITIN 432*  --   ?TIBC 253  --   ?IRON 24*  --   ?RETICCTPCT 3.0  --   ? ?Sepsis Labs: ?Recent Labs  ?Lab 11/01/21 ?0108 11/01/21 ?0415  ?LATICACIDVEN 0.9 0.5  ? ? ?Recent Results (from the past 240 hour(s))  ?Culture, blood (routine x 2)     Status: None (Preliminary result)  ? Collection Time: 10/31/21 11:23 PM  ? Specimen: BLOOD  ?Result Value Ref Range Status  ? Specimen Description   Final  ?  BLOOD RIGHT  FOREARM ?Performed at [facility], [address], [place], [place] [address] ?  ? Special Requests   Final  ?  BOTTLES DRAWN AEROBIC AND ANAEROBIC Blood Culture results may not be optimal due to an excessive volume of blood received in culture bottles ?Performed at [facility], [address] [place], [place] [address] ?  ? Culture  Setup Time   Final  ?  Organism ID to follow ?GRAM POSITIVE COCCI ?ANAEROBIC BOTTLE ONLY ?CRITICAL RESULT CALLED TO, READ BACK BY AND VERIFIED WITH: NATHAN BELUTH @ 0008 ON 11/02/21.Marland KitchenMarland KitchenTKR ?GRAM STAIN REVIEWED-AGREE WITH RESULT ?Performed at [facility], [place] [address], [place], [place] [address] ?  ? Culture GRAM POSITIVE COCCI  Final  ? Report Status PENDING  Incomplete  ?Culture,  blood (routine x 2)     Status: None (Preliminary result)  ? Collection Time: 10/31/21 11:23 PM  ? Specimen: BLOOD  ?Result Value Ref Range Status  ? Specimen Description BLOOD LEFT ASSIST CONTROL  Final  ? Special Requests   Final  ?  BOTTLES DRAWN AEROBIC AND ANAEROBIC Blood Culture results may not be optimal due to an excessive volume of blood received in culture bottles  ? Culture   Final  ?  NO GROWTH 2 DAYS ?Performed at [facility], [address], [place], [place] [address] ?  ? Report Status PENDING  Incomplete  ?Blood Culture ID Panel (Reflexed)     Status: Abnormal  ? Collection Time: 10/31/21 11:23 PM  ?Result Value Ref Range Status  ? Enterococcus faecalis NOT DETECTED NOT DETECTED Final  ? Enterococcus Faecium NOT DETECTED NOT DETECTED Final  ? Listeria monocytogenes NOT DETECTED NOT DETECTED Final  ? Staphylococcus species DETECTED (A) NOT DETECTED Final  ?  Comment: CRITICAL RESULT CALLED TO, READ BACK BY AND VERIFIED WITH: ?NATHAN BELUTH @ 0008 ON 11/02/21.Marland KitchenMarland KitchenTKR ?  ? Staphylococcus aureus (BCID) DETECTED (A) NOT DETECTED Final  ?  Comment: Methicillin (oxacillin)-resistant Staphylococcus aureus (MRSA). MRSA is predictably resistant to beta-lactam  antibiotics (except ceftaroline). Preferred therapy is vancomycin unless clinically contraindicated. Patient requires contact precautions if  ?hospitalized. ?CRITICAL RESULT CALLED TO, READ BACK BY AND VERIFIED WITH: ?NATHAN BELUT

## 2021-11-02 NOTE — Progress Notes (Signed)
Patient husband asked for an update concerning change in procedure plans for his wife. MD notified. ?

## 2021-11-02 NOTE — Progress Notes (Signed)
Patient planned for L5-S1 aspiration today in IR, however due to staffing issues procedure will need to be delayed until 4/21. Primary team aware. ? ?Patient to be NPO at midnight on 4/21, CBC from 4/19 and INR from today reviewed and within acceptable limits - will not repeat pre-procedure labs. ? ?IR will call for patient when available, please call with any questions or concerns. ? ?Candiss Norse, PA-C ?

## 2021-11-02 NOTE — Anesthesia Procedure Notes (Signed)
Procedure Name: LMA Insertion ?Date/Time: 11/02/2021 3:57 PM ?Performed by: Nelda Marseille, CRNA ?Pre-anesthesia Checklist: Patient identified, Patient being monitored, Timeout performed, Emergency Drugs available and Suction available ?Patient Re-evaluated:Patient Re-evaluated prior to induction ?Oxygen Delivery Method: Circle system utilized ?Preoxygenation: Pre-oxygenation with 100% oxygen ?Induction Type: IV induction ?Ventilation: Mask ventilation without difficulty ?LMA: LMA inserted ?LMA Size: 4.0 ?Tube type: Oral ?Number of attempts: 1 ?Placement Confirmation: positive ETCO2 and breath sounds checked- equal and bilateral ?Tube secured with: Tape ?Dental Injury: Teeth and Oropharynx as per pre-operative assessment  ? ? ? ? ?

## 2021-11-02 NOTE — Op Note (Signed)
11/02/2021 ? ?4:24 PM ? ?PATIENT:  Jill Cox  46 y.o. female ? ?PRE-OPERATIVE DIAGNOSIS:  Right calf abscess, left thigh abscess ? ?POST-OPERATIVE DIAGNOSIS:  Right calf abscess, left thigh abscess ? ?PROCEDURE:  Procedure(s) with comments: ?IRRIGATION AND DEBRIDEMENT WOUND LEFT THIGH, AND RIGHT CALF (Bilateral) - RIGHT LOWER LEG, LEFT UPPER LEG ? ?SURGEON: Leitha Schuller, MD ? ?ASSISTANTS: None ? ?ANESTHESIA:   general ? ?EBL:  Total I/O ?In: 100 [IV Piggyback:100] ?Out: 10 [Blood:10] ? ?BLOOD ADMINISTERED:none ? ?DRAINS:  Wound VAC with white foam in each undermining sinus   ? ?LOCAL MEDICATIONS USED:  NONE ? ?SPECIMEN:  Source of Specimen:    2 cultures from each abscess ? ?DISPOSITION OF SPECIMEN:   Microbiology ? ?COUNTS:  YES ? ?TOURNIQUET:  * No tourniquets in log * ? ?IMPLANTS: None ? ?DICTATION: .Dragon Dictation patient was brought to the operating room and after adequate anesthesia was obtained both legs were prepped and draped in usual sterile fashion.  After patient identification and timeout procedures were completed the lateral thigh incision was opened and after getting down to the skin a large amount of purulent fluid was noted with culture obtained in proximal and distal directions x2.  Following this the wound was irrigated and there was a large amount of solid purulent material consistent with necrotic fat this was debrided with use of a rondure followed by pulse lavage till there is no more foreign body present.  The abscess undermined the skin proximally and distally for the total size of the wound being 6 cm anterior to posterior 12 cm distal to proximal and 1 cm deep after going through the subcutaneous layer but attention was then turned to the right leg and at the close of the case white foam was placed packed underneath the tracking sinus proximally and distally through that lateral incision with wound VAC applied.  Going to the right calf abscess the prior incision was opened and  extended slightly as the sinus tract proximally and there is a large amount of purulent fluid present again culture was obtained this measured approximately 6 cm in length 2 cm wide and 1 cm deep.  This was again irrigated thoroughly with pulsatile lavage and then white foam was placed up into this sinus to allow for wound VAC care and healing.  After wound both wound vacs were applied and connected with a Y adapter to a single unit there did not appear to be a leak and there was adequate debridement help felt to be obtained in both abscesses adequately addressed. ? ?PLAN OF CARE: Admit to inpatient  ? ?PATIENT DISPOSITION:  PACU - hemodynamically stable. ?  ? ?

## 2021-11-02 NOTE — Consult Note (Signed)
Patient is a 46 year old who has had recurrent bacteremia with multiple abscesses previously treated last month at [facility] by Dr. Ginette Pitman and Dr. Apolonio Schneiders who did debridements of upper and lower extremities.  She has been on home antibiotics and has recurrence of abscess based on ultrasound yesterday with fluid collections underneath the prior incisions.  Was consulted for evaluation for possible surgical debridement ?On exam she has swelling in both of these areas with prior incisions that are predominantly healed but with a soft fluid collection present that is palpable and tender. ?Impression is recurrent abscess left lateral thigh and posterior calf ?Recommendation is for irrigation debridement and with recurrence try wound VAC to try to prevent recurrence allow for wound healing.  This was discussed with the patient and that may be 2 to 3 weeks of wound VAC treatment at home to get the incision is completely healed. ?

## 2021-11-02 NOTE — Anesthesia Preprocedure Evaluation (Signed)
Anesthesia Evaluation  ?Patient identified by MRN, date of birth, ID band ?Patient awake ? ? ? ?Reviewed: ?Allergy & Precautions, NPO status , Patient's Chart, lab work & pertinent test results ? ?History of Anesthesia Complications ?(+) PONV and history of anesthetic complications ? ?Airway ?Mallampati: II ? ?TM Distance: >3 FB ?Neck ROM: full ? ? ? Dental ? ?(+) Chipped, Poor Dentition, Missing ?  ?Pulmonary ?neg pulmonary ROS, neg shortness of breath, Patient abstained from smoking.,  ?  ?Pulmonary exam normal ? ? ? ? ? ? ? Cardiovascular ?Exercise Tolerance: Good ?(-) angina(-) Past MI negative cardio ROS ?Normal cardiovascular exam ? ? ?  ?Neuro/Psych ?PSYCHIATRIC DISORDERS negative neurological ROS ?   ? GI/Hepatic ?negative GI ROS, Neg liver ROS, neg GERD  ,  ?Endo/Other  ?negative endocrine ROS ? Renal/GU ?  ? ?  ?Musculoskeletal ? ?(+) Arthritis ,  ? Abdominal ?  ?Peds ? Hematology ?negative hematology ROS ?(+)   ?Anesthesia Other Findings ?Past Medical History: ?No date: Depression ?No date: Engages in vaping ?No date: PONV (postoperative nausea and vomiting) ? ?Past Surgical History: ?No date: CHOLECYSTECTOMY ?09/30/2021: I & D EXTREMITY; Left ?    Comment:  Procedure: IRRIGATION AND DEBRIDEMENT LEFT FOREARM  ?             ABSCESS;  Surgeon: Bradly Bienenstock, MD;  Location: [facility] OR;   ?             Service: Orthopedics;  Laterality: Left; ?10/02/2021: I & D EXTREMITY; Bilateral ?    Comment:  Procedure: IRRIGATION AND DEBRIDEMENT EXTREMITY;   ?             Surgeon: Myrene Galas, MD;  Location: [facility] OR;  Service:  ?             Orthopedics;  Laterality: Bilateral;  arthrocentsis  ?             bilateral knees and right ankle ? ?09/26/2021: TEE WITHOUT CARDIOVERSION; N/A ?    Comment:  Procedure: TRANSESOPHAGEAL ECHOCARDIOGRAM (TEE);   ?             Surgeon: Antonieta Iba, MD;  Location: [facility] ORS;   ?             Service: Cardiovascular;  Laterality: N/A; ? ?BMI   ? Body Mass  Index: 29.23 kg/m?  ?  ? ? Reproductive/Obstetrics ?negative OB ROS ? ?  ? ? ? ? ? ? ? ? ? ? ? ? ? ?  ?  ? ? ? ? ? ? ? ? ?Anesthesia Physical ?Anesthesia Plan ? ?ASA: 3 ? ?Anesthesia Plan: General LMA  ? ?Post-op Pain Management:   ? ?Induction: Intravenous ? ?PONV Risk Score and Plan: Dexamethasone, Ondansetron, Midazolam, Treatment may vary due to age or medical condition and TIVA ? ?Airway Management Planned: LMA ? ?Additional Equipment:  ? ?Intra-op Plan:  ? ?Post-operative Plan: Extubation in OR ? ?Informed Consent: I have reviewed the patients History and Physical, chart, labs and discussed the procedure including the risks, benefits and alternatives for the proposed anesthesia with the patient or authorized representative who has indicated his/her understanding and acceptance.  ? ? ? ?Dental Advisory Given ? ?Plan Discussed with: Anesthesiologist, CRNA and Surgeon ? ?Anesthesia Plan Comments: (Patient consented for risks of anesthesia including but not limited to:  ?- adverse reactions to medications ?- damage to eyes, teeth, lips or other oral mucosa ?- nerve damage due to positioning  ?- sore throat  or hoarseness ?- Damage to heart, brain, nerves, lungs, other parts of body or loss of life ? ?Patient voiced understanding.)  ? ? ? ? ? ? ?Anesthesia Quick Evaluation ? ?

## 2021-11-03 ENCOUNTER — Inpatient Hospital Stay: Payer: 59 | Admitting: Certified Registered"

## 2021-11-03 ENCOUNTER — Other Ambulatory Visit: Payer: Self-pay

## 2021-11-03 ENCOUNTER — Inpatient Hospital Stay: Payer: 59

## 2021-11-03 ENCOUNTER — Encounter: Payer: Self-pay | Admitting: Orthopedic Surgery

## 2021-11-03 ENCOUNTER — Encounter
Admission: EM | Disposition: A | Discharge status: Home Health | Payer: Self-pay | Source: Home / Self Care | Attending: Osteopathic Medicine

## 2021-11-03 DIAGNOSIS — M4647 Discitis, unspecified, lumbosacral region: Secondary | ICD-10-CM | POA: Diagnosis not present

## 2021-11-03 DIAGNOSIS — B9562 Methicillin resistant Staphylococcus aureus infection as the cause of diseases classified elsewhere: Secondary | ICD-10-CM | POA: Diagnosis not present

## 2021-11-03 DIAGNOSIS — R7881 Bacteremia: Secondary | ICD-10-CM | POA: Diagnosis not present

## 2021-11-03 HISTORY — PX: LUMBAR LAMINECTOMY FOR EPIDURAL ABSCESS: SHX5956

## 2021-11-03 LAB — EHRLICHIA ANTIBODY PANEL
E chaffeensis (HGE) Ab, IgG: NEGATIVE
E chaffeensis (HGE) Ab, IgM: NEGATIVE
E. Chaffeensis (HME) IgM Titer: NEGATIVE
E.Chaffeensis (HME) IgG: NEGATIVE

## 2021-11-03 LAB — IGG, IGA, IGM
IgA: 299 mg/dL (ref 87–352)
IgG (Immunoglobin G), Serum: 1483 mg/dL (ref 586–1602)
IgM (Immunoglobulin M), Srm: 66 mg/dL (ref 26–217)

## 2021-11-03 LAB — ANCA PROFILE
Anti-MPO Antibodies: <0.2 units (ref 0.0–0.9)
Anti-PR3 Antibodies: <0.2 units (ref 0.0–0.9)
Atypical P-ANCA titer: <1:20 {titer}
C-ANCA: <1:20 {titer}
P-ANCA: <1:20 {titer}

## 2021-11-03 LAB — VANCOMYCIN, TROUGH: Vancomycin Tr: 10 ug/mL — ABNORMAL LOW (ref 15–20)

## 2021-11-03 IMAGING — MR MR CERVICAL SPINE WO/W CM
5 of 8 series · 29 of 48 positions shown · IV contrast (gadavist)
Comparison: [DATE]

CLINICAL DATA: Epidural abscess

EXAM:
MRI CERVICAL SPINE WITHOUT AND WITH CONTRAST
TECHNIQUE: Multiplanar and multiecho pulse sequences of the cervical spine, to
include the craniocervical junction and cervicothoracic junction,
were obtained without and with intravenous contrast.
CONTRAST:  8mL GADAVIST GADOBUTROL 1 MMOL/ML IV SOLN

[Series 5: T2 · sagittal · 3.0mm · 0.62mm/px · 4 of 15 slices shown (1 of 2)]
[im 1/15]
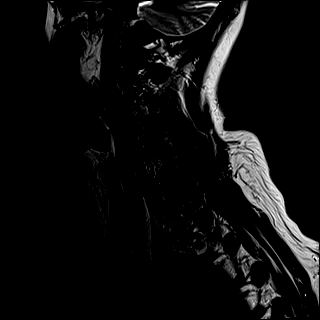
[im 5/15]
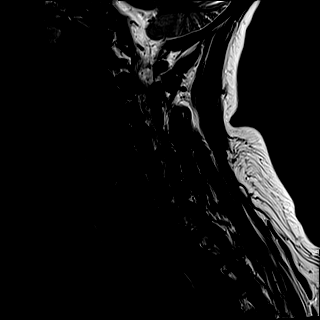
[im 10/15]
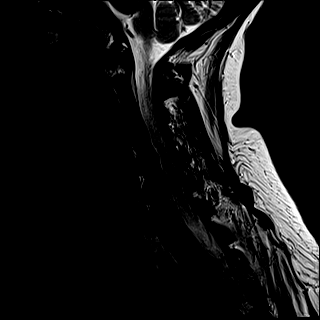
[im 15/15]
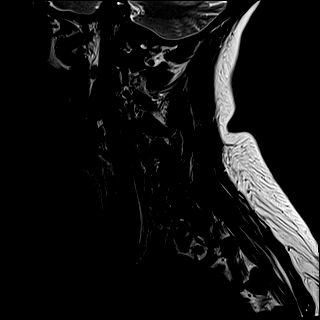

[Series 7: STIR · sagittal · 3.0mm · 0.62mm/px · 4 of 15 slices shown]
[im 1/15]
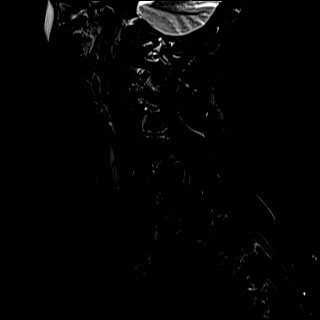
[im 5/15]
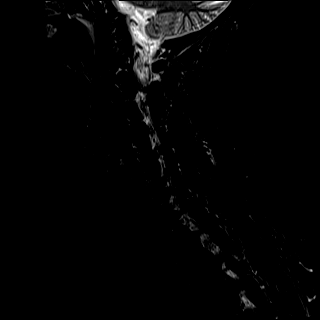
[im 10/15]
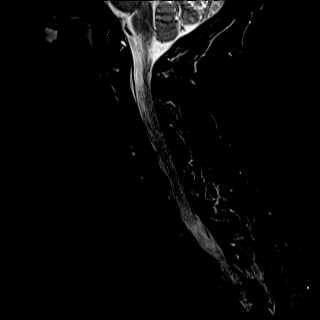
[im 15/15]
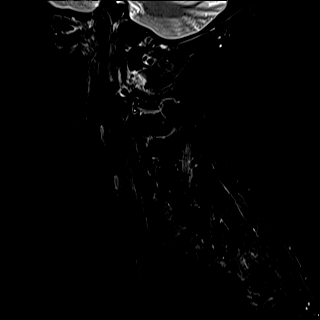

[Series 8: T2 · axial · 3.0mm · 0.70mm/px · z∈[-102,-7]mm · 8 of 29 slices shown (2 of 2)]
[im 1/29]
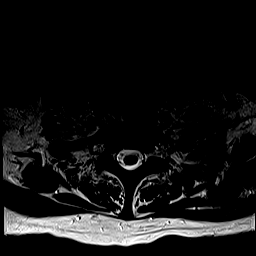
[im 5/29]
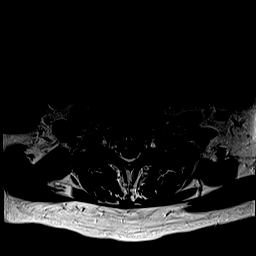
[im 9/29]
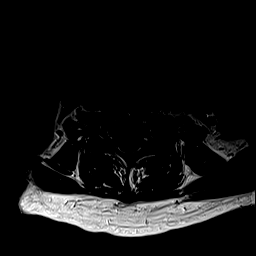
[im 13/29]
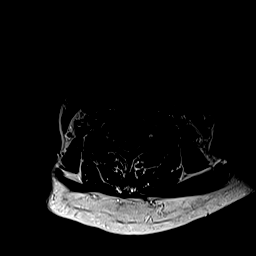
[im 17/29]
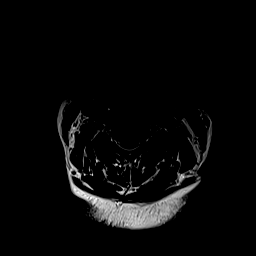
[im 21/29]
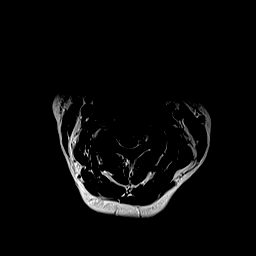
[im 25/29]
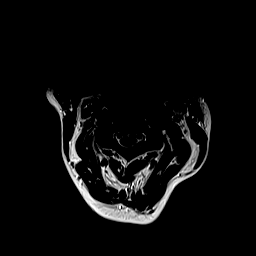
[im 29/29]
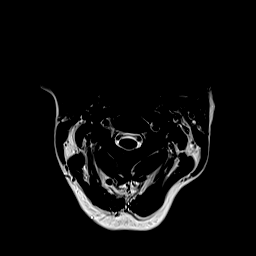

[Series 10: T1 · axial · non-contrast · 3.0mm · 0.35mm/px · z∈[-102,-7]mm · 8 of 29 slices shown]
[im 1/29]
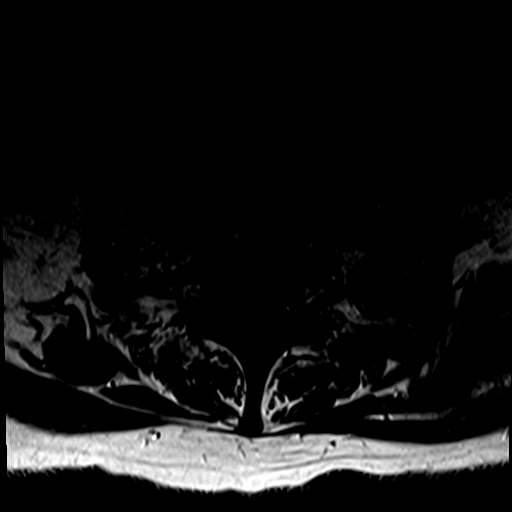
[im 5/29]
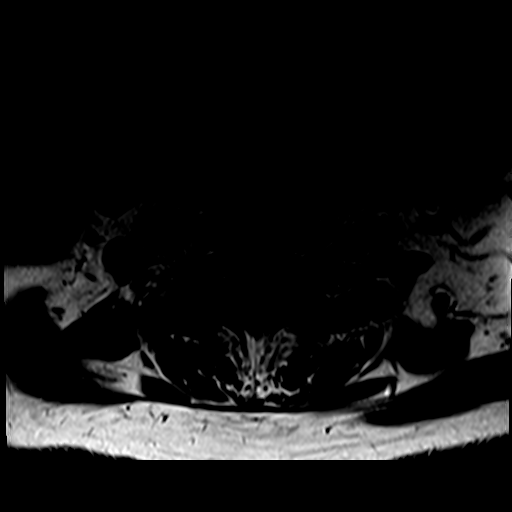
[im 9/29]
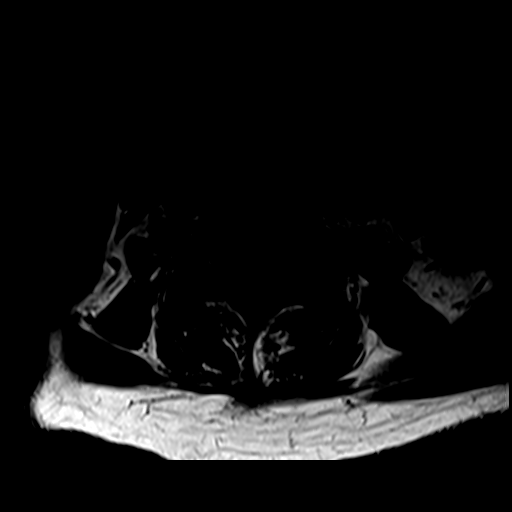
[im 13/29]
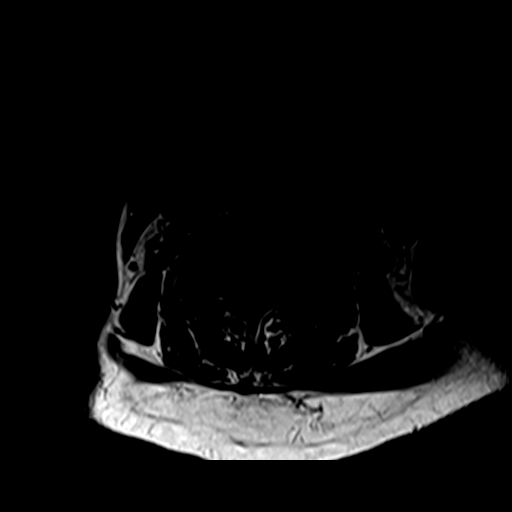
[im 17/29]
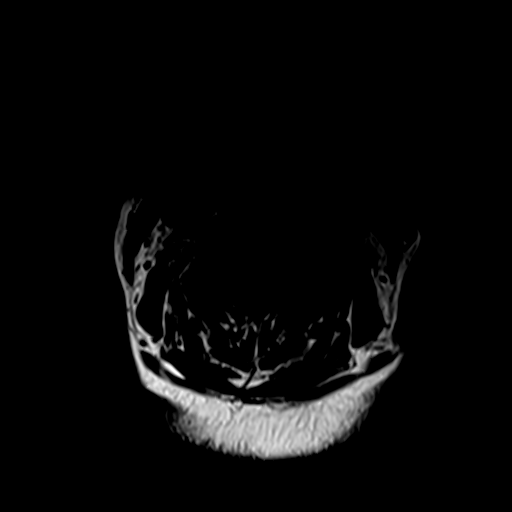
[im 21/29]
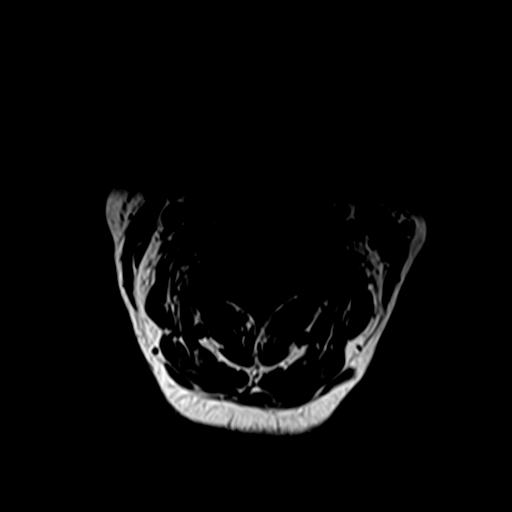
[im 25/29]
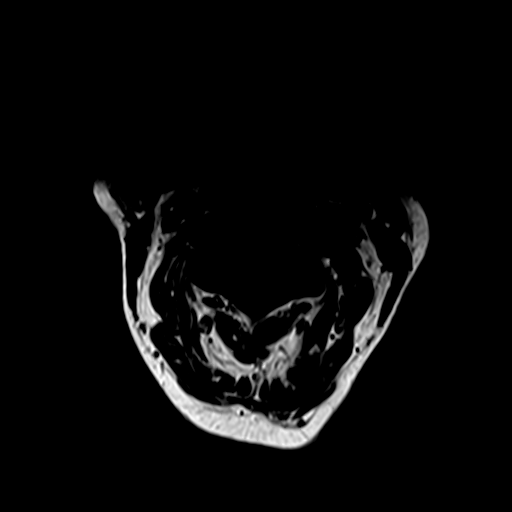
[im 29/29]
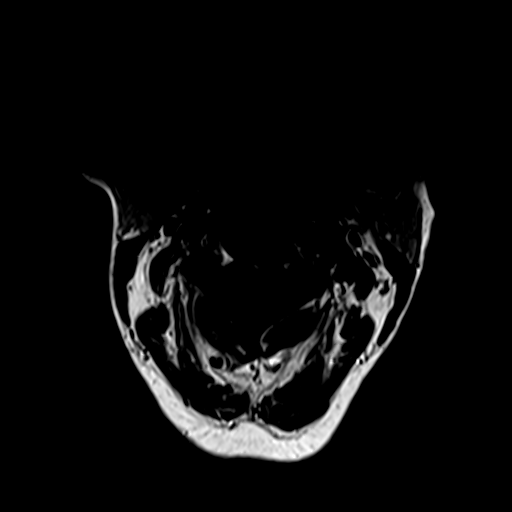

[Series 12: T1 post-contrast · axial · 3.0mm · 0.35mm/px · z∈[-102,-48]mm · 5 of 29 slices shown]
[im 1/29]
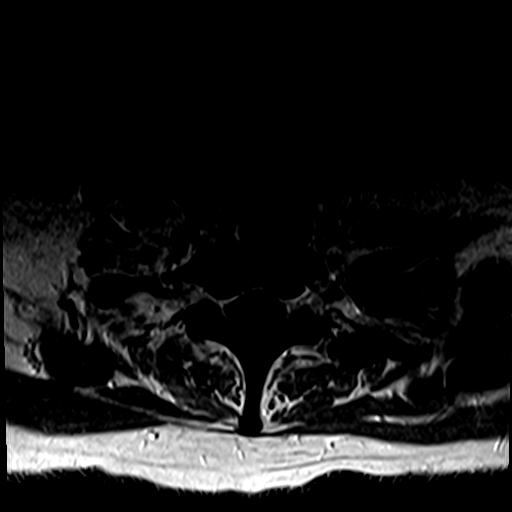
[im 5/29]
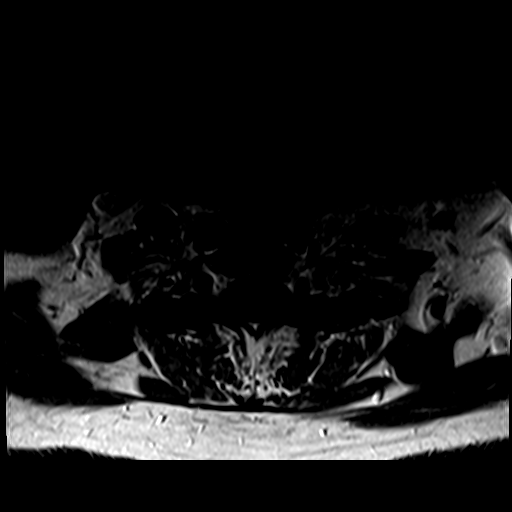
[im 9/29]
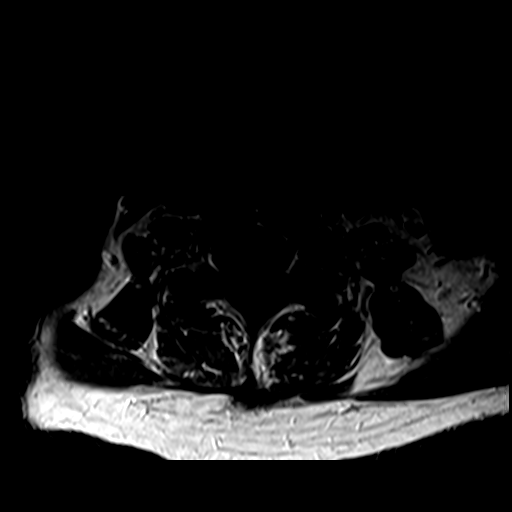
[im 13/29]
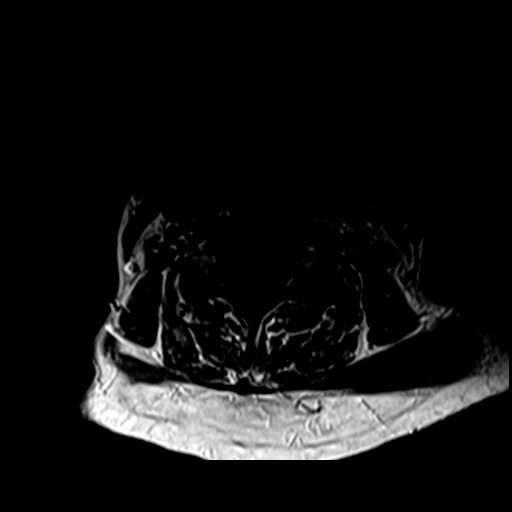
[im 17/29]
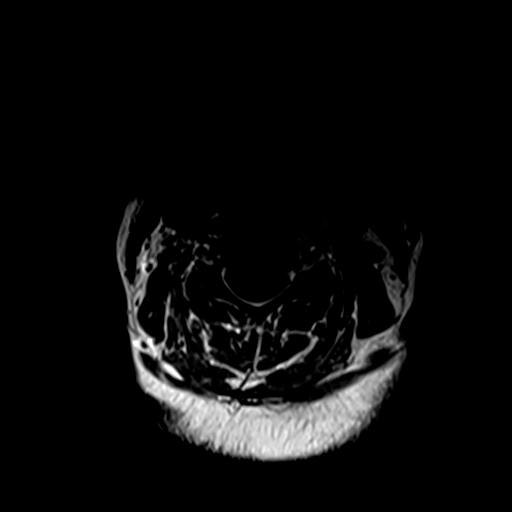

[29 of 48 positions shown; findings below may reference images not displayed]

FINDINGS: Alignment: Physiologic.

Vertebrae: No fracture, evidence of discitis, or bone lesion.

Cord: Normal signal and morphology. There is mild residual contrast
enhancement along the dorsal epidural surface without a
space-occupying fluid collection.

Posterior Fossa, vertebral arteries, paraspinal tissues: Negative.

Disc levels:

C1-2: Unremarkable.

C2-3: Normal disc space and facet joints. There is no spinal canal
stenosis. No neural foraminal stenosis.

C3-4: Normal disc space and facet joints. There is no spinal canal
stenosis. No neural foraminal stenosis.

C4-5: Normal disc space and facet joints. There is no spinal canal
stenosis. No neural foraminal stenosis.

C5-6: Disc desiccation and minimal bulge. There is no spinal canal
stenosis. No neural foraminal stenosis.

C6-7: Small left subarticular disc protrusion. There is no spinal
canal stenosis. No neural foraminal stenosis.

C7-T1: Normal disc space and facet joints. There is no spinal canal
stenosis. No neural foraminal stenosis.
IMPRESSION: 1. Decreased dorsal epidural abscess with minimal residual phlegmon,
measuring 1-2 mm.
2. Improved patency of the thecal sac.

## 2021-11-03 IMAGING — RF DG LUMBAR SPINE 2-3V
1 series · 1 of 1 positions shown · non-contrast
Comparison: MRI lumbar spine [DATE]

CLINICAL DATA: Intraoperative fluoroscopy. L5-S1 evacuation of
epidural abscess.

EXAM:
LUMBAR SPINE - 2-3 VIEW

[Series 1: dg no report - auto finalize · 0.20mm/px · 1 of 1 slices shown]
[im 1/1]
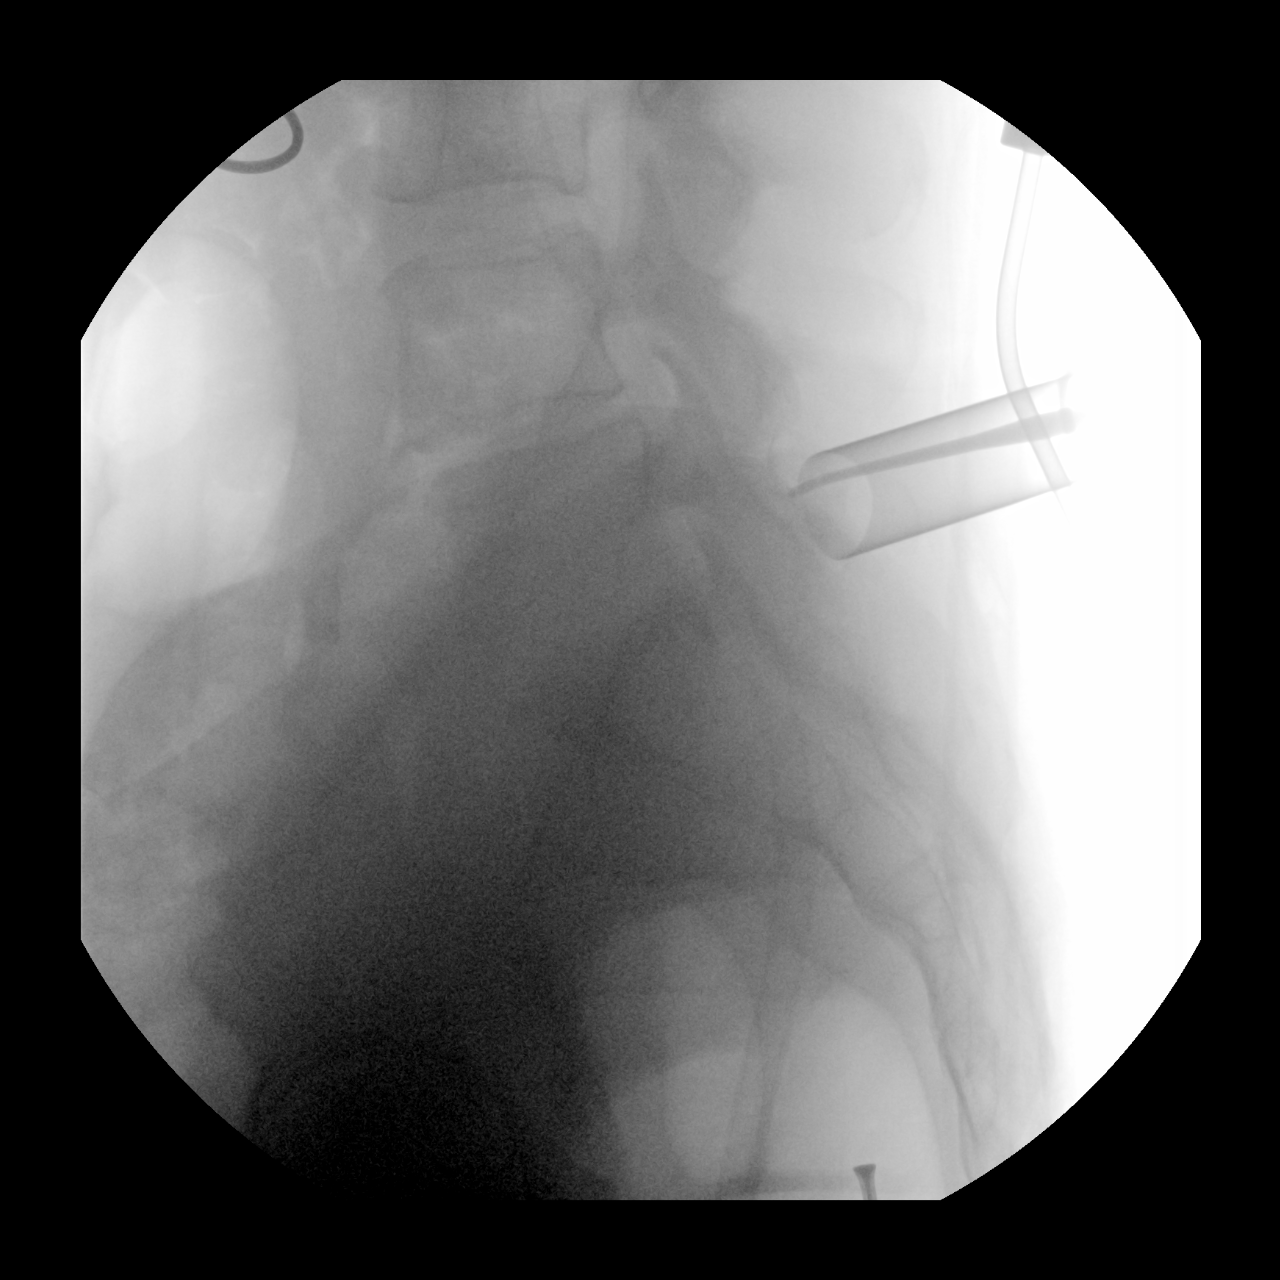

[1 of 1 positions shown; findings below may reference images not displayed]

FINDINGS: Images were performed intraoperatively without the presence of a
radiologist. Single lateral view of the lumbar spine. Posterior
surgical instrumentation points to the posterior L5 elements in the
posteroinferior aspect of the L5 vertebral body.

Total fluoroscopy images: 1

Total fluoroscopy time: 3 seconds

Please see intraoperative findings for further detail.
IMPRESSION: Fluoroscopy for L5-S1 epidural abscess evacuation.

## 2021-11-03 SURGERY — LUMBAR LAMINECTOMY FOR EPIDURAL ABSCESS
Anesthesia: General

## 2021-11-03 MED ORDER — DEXAMETHASONE SODIUM PHOSPHATE 10 MG/ML IJ SOLN
INTRAMUSCULAR | Status: DC | PRN
  Administered 2021-11-03: 10 mg via INTRAVENOUS

## 2021-11-03 MED ORDER — BUPIVACAINE HCL (PF) 0.5 % IJ SOLN
INTRAMUSCULAR | Status: AC
  Filled 2021-11-03: qty 30

## 2021-11-03 MED ORDER — FENTANYL CITRATE (PF) 100 MCG/2ML IJ SOLN
INTRAMUSCULAR | Status: AC
  Filled 2021-11-03: qty 2

## 2021-11-03 MED ORDER — FENTANYL CITRATE (PF) 100 MCG/2ML IJ SOLN
INTRAMUSCULAR | Status: DC | PRN
  Administered 2021-11-03: 100 ug via INTRAVENOUS

## 2021-11-03 MED ORDER — ONDANSETRON HCL 4 MG/2ML IJ SOLN
INTRAMUSCULAR | Status: DC | PRN
  Administered 2021-11-03: 4 mg via INTRAVENOUS

## 2021-11-03 MED ORDER — LIDOCAINE HCL (CARDIAC) PF 100 MG/5ML IV SOSY
PREFILLED_SYRINGE | INTRAVENOUS | Status: DC | PRN
  Administered 2021-11-03: 100 mg via INTRAVENOUS

## 2021-11-03 MED ORDER — VANCOMYCIN HCL 1.5 G IV SOLR
1500.0000 mg | Freq: Two times a day (BID) | INTRAVENOUS | Status: DC
  Administered 2021-11-03 (×2): 1500 mg via INTRAVENOUS
  Filled 2021-11-03 (×2): qty 1500
  Filled 2021-11-03: qty 30

## 2021-11-03 MED ORDER — ROCURONIUM BROMIDE 100 MG/10ML IV SOLN
INTRAVENOUS | Status: DC | PRN
Start: 2021-11-03 — End: 2021-11-03
  Administered 2021-11-03: 50 mg via INTRAVENOUS
  Administered 2021-11-03: 30 mg via INTRAVENOUS
  Administered 2021-11-03: 20 mg via INTRAVENOUS

## 2021-11-03 MED ORDER — SUGAMMADEX SODIUM 500 MG/5ML IV SOLN
INTRAVENOUS | Status: DC | PRN
  Administered 2021-11-03: 500 mg via INTRAVENOUS

## 2021-11-03 MED ORDER — ACETAMINOPHEN 500 MG PO TABS
ORAL_TABLET | ORAL | Status: AC
Start: 2021-11-03 — End: 2021-11-04
  Filled 2021-11-03: qty 2

## 2021-11-03 MED ORDER — HYDROMORPHONE HCL 1 MG/ML IJ SOLN
0.2500 mg | INTRAMUSCULAR | Status: DC | PRN
  Administered 2021-11-03 (×2): 0.25 mg via INTRAVENOUS

## 2021-11-03 MED ORDER — VANCOMYCIN HCL 1500 MG/300ML IV SOLN
1500.0000 mg | Freq: Two times a day (BID) | INTRAVENOUS | Status: DC
  Filled 2021-11-03 (×3): qty 300

## 2021-11-03 MED ORDER — POLYETHYLENE GLYCOL 3350 17 G PO PACK
17.0000 g | PACK | Freq: Every day | ORAL | Status: DC | PRN
  Administered 2021-11-05: 17 g via ORAL
  Filled 2021-11-03: qty 1

## 2021-11-03 MED ORDER — PROPOFOL 10 MG/ML IV BOLUS
INTRAVENOUS | Status: AC
  Filled 2021-11-03: qty 20

## 2021-11-03 MED ORDER — MIDAZOLAM HCL 2 MG/2ML IJ SOLN
INTRAMUSCULAR | Status: DC | PRN
  Administered 2021-11-03: 2 mg via INTRAVENOUS

## 2021-11-03 MED ORDER — PROPOFOL 10 MG/ML IV BOLUS
INTRAVENOUS | Status: DC | PRN
  Administered 2021-11-03: 150 mg via INTRAVENOUS

## 2021-11-03 MED ORDER — BUPIVACAINE LIPOSOME 1.3 % IJ SUSP
INTRAMUSCULAR | Status: AC
  Filled 2021-11-03: qty 20

## 2021-11-03 MED ORDER — PROPOFOL 500 MG/50ML IV EMUL
INTRAVENOUS | Status: DC | PRN
  Administered 2021-11-03: 50 ug/kg/min via INTRAVENOUS

## 2021-11-03 MED ORDER — GABAPENTIN 300 MG PO CAPS
300.0000 mg | ORAL_CAPSULE | Freq: Once | ORAL | Status: AC
  Administered 2021-11-03: 300 mg via ORAL

## 2021-11-03 MED ORDER — 0.9 % SODIUM CHLORIDE (POUR BTL) OPTIME
TOPICAL | Status: DC | PRN
  Administered 2021-11-03: 1000 mL

## 2021-11-03 MED ORDER — GABAPENTIN 300 MG PO CAPS
ORAL_CAPSULE | ORAL | Status: AC
  Filled 2021-11-03: qty 1

## 2021-11-03 MED ORDER — ACETAMINOPHEN 500 MG PO TABS
1000.0000 mg | ORAL_TABLET | Freq: Once | ORAL | Status: AC
Start: 2021-11-03 — End: 2021-11-03
  Administered 2021-11-03: 1000 mg via ORAL

## 2021-11-03 MED ORDER — GADOBUTROL 1 MMOL/ML IV SOLN
8.0000 mL | Freq: Once | INTRAVENOUS | Status: AC | PRN
  Administered 2021-11-03: 8 mL via INTRAVENOUS

## 2021-11-03 MED ORDER — OXYCODONE HCL 5 MG PO TABS
5.0000 mg | ORAL_TABLET | ORAL | Status: DC | PRN
  Administered 2021-11-03 – 2021-11-10 (×33): 5 mg via ORAL
  Filled 2021-11-03 (×33): qty 1

## 2021-11-03 MED ORDER — PROPOFOL 1000 MG/100ML IV EMUL
INTRAVENOUS | Status: AC
  Filled 2021-11-03: qty 100

## 2021-11-03 MED ORDER — VANCOMYCIN HCL 1000 MG IV SOLR
INTRAVENOUS | Status: AC
  Filled 2021-11-03: qty 20

## 2021-11-03 MED ORDER — HYDROMORPHONE HCL 1 MG/ML IJ SOLN
INTRAMUSCULAR | Status: AC
  Filled 2021-11-03: qty 1

## 2021-11-03 MED ORDER — HYDROMORPHONE HCL 1 MG/ML IJ SOLN
INTRAMUSCULAR | Status: AC
  Administered 2021-11-03: 0.5 mg via INTRAVENOUS
  Filled 2021-11-03: qty 1

## 2021-11-03 MED ORDER — LACTATED RINGERS IV SOLN
INTRAVENOUS | Status: DC | PRN
Start: 2021-11-03 — End: 2021-11-03

## 2021-11-03 MED ORDER — BUPIVACAINE-EPINEPHRINE (PF) 0.5% -1:200000 IJ SOLN
INTRAMUSCULAR | Status: AC
  Filled 2021-11-03: qty 30

## 2021-11-03 MED ORDER — SODIUM CHLORIDE FLUSH 0.9 % IV SOLN
INTRAVENOUS | Status: AC
  Filled 2021-11-03: qty 20

## 2021-11-03 MED ORDER — ROCURONIUM BROMIDE 10 MG/ML (PF) SYRINGE
PREFILLED_SYRINGE | INTRAVENOUS | Status: AC
  Filled 2021-11-03: qty 10

## 2021-11-03 MED ORDER — METHYLPREDNISOLONE ACETATE 40 MG/ML IJ SUSP
INTRAMUSCULAR | Status: AC
  Filled 2021-11-03: qty 1

## 2021-11-03 MED ORDER — PROMETHAZINE HCL 25 MG/ML IJ SOLN: 6.2500 mg | INTRAMUSCULAR | Status: DC | PRN

## 2021-11-03 MED ORDER — EPHEDRINE SULFATE (PRESSORS) 50 MG/ML IJ SOLN
INTRAMUSCULAR | Status: DC | PRN
  Administered 2021-11-03: 5 mg via INTRAVENOUS

## 2021-11-03 MED ORDER — MIDAZOLAM HCL 2 MG/2ML IJ SOLN
INTRAMUSCULAR | Status: AC
  Filled 2021-11-03: qty 2

## 2021-11-03 MED ORDER — LIDOCAINE HCL (PF) 2 % IJ SOLN
INTRAMUSCULAR | Status: AC
  Filled 2021-11-03: qty 5

## 2021-11-03 MED ORDER — BUPIVACAINE-EPINEPHRINE (PF) 0.5% -1:200000 IJ SOLN
INTRAMUSCULAR | Status: DC | PRN
  Administered 2021-11-03: 4 mL

## 2021-11-03 SURGICAL SUPPLY — 55 items
BUR NEURO DRILL SOFT 3.0X3.8M (BURR) ×2 IMPLANT
CHLORAPREP W/TINT 26 (MISCELLANEOUS) ×4 IMPLANT
CNTNR SPEC 2.5X3XGRAD LEK (MISCELLANEOUS) ×1
CONT SPEC 4OZ STER OR WHT (MISCELLANEOUS) ×1
CONTAINER SPEC 2.5X3XGRAD LEK (MISCELLANEOUS) ×1 IMPLANT
COUNTER NEEDLE 20/40 LG (NEEDLE) ×2 IMPLANT
CUP MEDICINE 2OZ PLAST GRAD ST (MISCELLANEOUS) ×4 IMPLANT
DERMABOND ADVANCED (GAUZE/BANDAGES/DRESSINGS) ×1
DERMABOND ADVANCED .7 DNX12 (GAUZE/BANDAGES/DRESSINGS) ×1 IMPLANT
DRAPE C ARM PK CFD 31 SPINE (DRAPES) ×2 IMPLANT
DRAPE LAPAROTOMY 100X77 ABD (DRAPES) ×2 IMPLANT
DRAPE MICROSCOPE SPINE 48X150 (DRAPES) ×2 IMPLANT
DRAPE SURG 17X11 SM STRL (DRAPES) ×2 IMPLANT
DRSG OPSITE POSTOP 3X4 (GAUZE/BANDAGES/DRESSINGS) ×1 IMPLANT
ELECT CAUTERY BLADE TIP 2.5 (TIP) ×2
ELECT EZSTD 165MM 6.5IN (MISCELLANEOUS)
ELECT REM PT RETURN 9FT ADLT (ELECTROSURGICAL) ×2
ELECTRODE CAUTERY BLDE TIP 2.5 (TIP) ×1 IMPLANT
ELECTRODE EZSTD 165MM 6.5IN (MISCELLANEOUS) IMPLANT
ELECTRODE REM PT RTRN 9FT ADLT (ELECTROSURGICAL) ×1 IMPLANT
GAUZE 4X4 16PLY ~~LOC~~+RFID DBL (SPONGE) ×2 IMPLANT
GLOVE BIOGEL PI IND STRL 6.5 (GLOVE) ×1 IMPLANT
GLOVE BIOGEL PI INDICATOR 6.5 (GLOVE) ×1
GLOVE SURG SYN 6.5 ES PF (GLOVE) ×4 IMPLANT
GLOVE SURG SYN 6.5 PF PI (GLOVE) ×2 IMPLANT
GLOVE SURG SYN 8.5  E (GLOVE) ×3
GLOVE SURG SYN 8.5 E (GLOVE) ×3 IMPLANT
GLOVE SURG SYN 8.5 PF PI (GLOVE) ×3 IMPLANT
GOWN SRG LRG LVL 4 IMPRV REINF (GOWNS) ×1 IMPLANT
GOWN SRG XL LVL 3 NONREINFORCE (GOWNS) ×1 IMPLANT
GOWN STRL NON-REIN TWL XL LVL3 (GOWNS) ×1
GOWN STRL REIN LRG LVL4 (GOWNS) ×1
GRADUATE 1200CC STRL 31836 (MISCELLANEOUS) ×2 IMPLANT
HEMOVAC 400CC 10FR (MISCELLANEOUS) ×1 IMPLANT
KIT SPINAL PRONEVIEW (KITS) ×2 IMPLANT
MANIFOLD NEPTUNE II (INSTRUMENTS) ×2 IMPLANT
MARKER SKIN DUAL TIP RULER LAB (MISCELLANEOUS) ×4 IMPLANT
NDL SAFETY ECLIPSE 18X1.5 (NEEDLE) ×1 IMPLANT
NEEDLE HYPO 18GX1.5 SHARP (NEEDLE) ×1
NEEDLE HYPO 22GX1.5 SAFETY (NEEDLE) ×2 IMPLANT
NS IRRIG 1000ML POUR BTL (IV SOLUTION) ×2 IMPLANT
PACK LAMINECTOMY NEURO (CUSTOM PROCEDURE TRAY) ×2 IMPLANT
PAD ARMBOARD 7.5X6 YLW CONV (MISCELLANEOUS) ×2 IMPLANT
SURGIFLO W/THROMBIN 8M KIT (HEMOSTASIS) ×1 IMPLANT
SUT DVC VLOC 3-0 CL 6 P-12 (SUTURE) ×2 IMPLANT
SUT VIC AB 0 CT1 27 (SUTURE) ×1
SUT VIC AB 0 CT1 27XCR 8 STRN (SUTURE) ×1 IMPLANT
SUT VIC AB 2-0 CT1 18 (SUTURE) ×2 IMPLANT
SYR 10ML LL (SYRINGE) ×2 IMPLANT
SYR 20ML LL LF (SYRINGE) ×2 IMPLANT
SYR 30ML LL (SYRINGE) ×4 IMPLANT
SYR 3ML LL SCALE MARK (SYRINGE) ×2 IMPLANT
TOWEL OR 17X26 4PK STRL BLUE (TOWEL DISPOSABLE) ×6 IMPLANT
TUBING CONNECTING 10 (TUBING) ×2 IMPLANT
WATER STERILE IRR 500ML POUR (IV SOLUTION) ×2 IMPLANT

## 2021-11-03 NOTE — Op Note (Signed)
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  She had worsening symptoms prompting surgical intervention ? ?Findings: pus in the epidural space and L5-S1 disc space ? 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 ? ?EBL: 10 ml ?IVF: see AR ml ?Drains: 1 placed ?Disposition: Extubated and Stable to PACU ?Complications: none ? ?No foley catheter was placed. ? ? ?Preoperative Note:  ? ?Risks of surgery discussed include: infection, bleeding, stroke, coma, death, paralysis, CSF leak, nerve/spinal cord injury, numbness, tingling, weakness, complex regional pain syndrome, recurrent stenosis and/or disc herniation, vascular injury, development of instability, neck/back pain, need for further surgery, persistent symptoms, development of deformity, and the risks of anesthesia. The patient understood these risks and agreed to proceed. ? ?Operative Note:  ? ?1. L5-S1 laminoforaminotomy for evacuation of epidural abscess. ? ?The patient was then brought from the preoperative center with intravenous access established.  The patient underwent general anesthesia and endotracheal tube intubation, and was then rotated on the [place] rail top where all pressure points were appropriately padded.  The skin was then thoroughly cleansed.  She was on therapeutic antibiotics for her infection..  Sterile prep and drapes were then applied and a timeout was then observed.  C-arm was brought into the field under sterile conditions and under lateral visualization the L5-S1 interspace was identified and marked. ? ?The incision was marked on the left and injected with local anesthetic. Once this was complete a 2 cm incision was opened with the use of a #10 blade knife.   ? ?The metrx tubes were sequentially advanced and confirmed in position at L5-S1. An 4mm by 83mm tube was locked in place to the bed side attachment.  The microscope was then sterilely brought into the  field and muscle creep was hemostased with a bipolar and resected with a pituitary rongeur.  A Bovie extender was then used to expose the spinous process and lamina.  Careful attention was placed to not violate the facet capsule. A 3 mm matchstick drill bit was then used to make a hemi-laminotomy trough until the ligamentum flavum was exposed.  This was extended to the base of the spinous process. ? ?Once this was complete and the underlying ligamentum flavum was visualized, it was dissected with a curette and resected with Kerrison rongeurs.   ? ?At this point, possible epidural space was identified.  There is extensive granulation tissue.  This was resected and pus was taken for culture.  The left S1 nerve root was identified and decompressed. ? ?The L5-S1 discs was identified and entered.  Pus was identified in the disc base.  This was washed out and some sent for biopsy.  Additional fluid from the disc base was sent as a culture. ? ?The area was irrigated extensively.  Vancomycin powder was then placed in the wound.  A deep drain was placed. ? ?After removing the tube, the wound was closed in layers with 0 and 2-0 Vicryl.  3-0 Monocryl was placed in skin.  Dermabond was placed for skin closure. ? ?The drain was secured to the skin. ? ?Patient was then rotated back to the preoperative bed awakened from anesthesia and taken to recovery all counts are correct in this case. ? ?I performed the entire procedure with the assistance of Cooper Render PA as an Pensions consultant. ? ?Kirandeep Fariss K. Izora Ribas MD ? ?

## 2021-11-03 NOTE — Anesthesia Preprocedure Evaluation (Addendum)
Anesthesia Evaluation  ?Patient identified by MRN, date of birth, ID band ?Patient awake ? ? ? ?Reviewed: ?Allergy & Precautions, NPO status , Patient's Chart, lab work & pertinent test results ? ?History of Anesthesia Complications ?(+) PONV and history of anesthetic complications ? ?Airway ?Mallampati: II ? ?TM Distance: >3 FB ?Neck ROM: full ? ? ? Dental ? ?(+) Chipped, Partial Upper, Partial Lower, Missing ?  ?Pulmonary ?neg pulmonary ROS, neg shortness of breath, Patient abstained from smoking.,  ?  ?Pulmonary exam normal ? ? ? ? ? ? ? Cardiovascular ?Exercise Tolerance: Good ?(-) angina(-) Past MI negative cardio ROS ?Normal cardiovascular exam ? ?09/26/21: ??1. No valve vegetation noted.  ??2. Left ventricular ejection fraction, by estimation, is 60 to 65%. The  ?left ventricle has normal function. The left ventricle has no regional  ?wall motion abnormalities.  ??3. Right ventricular systolic function is normal. The right ventricular  ?size is normal.  ??4. No left atrial/left atrial appendage thrombus was detected.  ??5. The mitral valve is normal in structure. Mild mitral valve  ?regurgitation, dual jets. No evidence of mitral stenosis.  ??6. The aortic valve is tricuspid. Aortic valve regurgitation is not  ?visualized. No aortic stenosis is present.  ??7. The inferior vena cava is normal in size with greater than 50%  ?respiratory variability, suggesting right atrial pressure of 3 mmHg.  ??8. Agitated saline contrast bubble study was negative, with no evidence  ?of any interatrial shunt.  ?  ?Neuro/Psych ?PSYCHIATRIC DISORDERS Depression Epidural abscess ? ?MRI of the thoracic and lumbar spine was done in the ER that shows discitis osteomyelitis at L5-S1 level with some epidural phlegmon ?  ? GI/Hepatic ?negative GI ROS, Neg liver ROS, neg GERD  ,  ?Endo/Other  ?negative endocrine ROS ? Renal/GU ?  ? ?  ?Musculoskeletal ? ?(+) Arthritis ,  ? Abdominal ?Normal abdominal  exam  (+)   ?Peds ? Hematology ? ?(+) Blood dyscrasia, anemia , Hgb 8.6   ?Anesthesia Other Findings ?MRSA bacteremia ?Recent episode of Left thigh and right calf abscess, status post I&D and wound VAC placement ?Epidural abscess with back pain ? ?Past Medical History: ?No date: Depression ?No date: Engages in vaping ?No date: PONV (postoperative nausea and vomiting) ? ?Past Surgical History: ?No date: CHOLECYSTECTOMY ?09/30/2021: I & D EXTREMITY; Left ?    Comment:  Procedure: IRRIGATION AND DEBRIDEMENT LEFT FOREARM  ?             ABSCESS;  Surgeon: Iran Planas, MD;  Location: [facility];   ?             Service: Orthopedics;  Laterality: Left; ?10/02/2021: I & D EXTREMITY; Bilateral ?    Comment:  Procedure: IRRIGATION AND DEBRIDEMENT EXTREMITY;   ?             Surgeon: Altamese Fairmount, MD;  Location: [facility];  Service:  ?             Orthopedics;  Laterality: Bilateral;  arthrocentsis  ?             bilateral knees and right ankle ? ?09/26/2021: TEE WITHOUT CARDIOVERSION; N/A ?    Comment:  Procedure: TRANSESOPHAGEAL ECHOCARDIOGRAM (TEE);   ?             Surgeon: Minna Merritts, MD;  Location: [facility];   ?             Service: Cardiovascular;  Laterality: N/A; ? ?BMI   ?  Body Mass Index: 29.23 kg/m?  ?  ? ? Reproductive/Obstetrics ?negative OB ROS ? ?  ? ? ? ? ? ? ? ? ? ? ? ? ? ?  ?  ? ? ? ? ? ? ? ?Anesthesia Physical ? ?Anesthesia Plan ? ?ASA: 3 ? ?Anesthesia Plan: General  ? ?Post-op Pain Management:   ? ?Induction: Intravenous ? ?PONV Risk Score and Plan: Dexamethasone, Ondansetron, Midazolam, Treatment may vary due to age or medical condition and TIVA ? ?Airway Management Planned: Oral ETT ? ?Additional Equipment:  ? ?Intra-op Plan:  ? ?Post-operative Plan: Extubation in OR ? ?Informed Consent: I have reviewed the patients History and Physical, chart, labs and discussed the procedure including the risks, benefits and alternatives for the proposed anesthesia with the patient or authorized representative who has  indicated his/her understanding and acceptance.  ? ? ? ?Dental advisory given ? ?Plan Discussed with: Anesthesiologist, CRNA and Surgeon ? ?Anesthesia Plan Comments: (Patient consented for risks of anesthesia including but not limited to:  ?- adverse reactions to medications ?- damage to eyes, teeth, lips or other oral mucosa ?- nerve damage due to positioning  ?- sore throat or hoarseness ?- Damage to heart, brain, nerves, lungs, other parts of body or loss of life ? ?Patient voiced understanding.)  ? ? ? ? ? ?Anesthesia Quick Evaluation ? ?

## 2021-11-03 NOTE — Progress Notes (Signed)
? ?Date of Admission:  10/31/2021    ? ? ?ID: Jill Cox is a 46 y.o. female    ?Principal Problem: ?  Back pain ?Active Problems: ?  MRSA bacteremia ?  Polyarthritis ?  Depression ?  Anemia ?  OM (osteomyelitis) (HCC) ?  Discitis ? ? ? ?Jill Cox is a 46 y.o. 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  ?3/18  forearm I/D and 4th dorsal compartment extensor tenosynovectomy  culture positive for MRSA ?3/20 aspiration  rt knee- No mrsa ?3/20 Aspiration RT ankle - MRSA ?3/20 Rt calf- MRSA ?3/20 Aspiration left left Knee -No MRSA ?3/20 Left thigh abscess- -I/D MRSA ?  ?She also had extensive vertebral involvement ?Rt facet  joint involvement  at T2-T3 /T3-T4  ?Cervical spine- dorsal epidural collection of 5 mm extending C1-C7 with moderate stenosis at C4-5, C5-6, and C6-7 level ?  ?Lumbar MRI done without contrast on 09/28/21 collection at the left aspect of the thecal sac at L5, measuring approximately 10 x 11 x 23 ?Mm. L4-L5 disc involvement was questioned ?  ?  ?She was initially treated with dual MRSA antibiotic with ceftaroline + Dapto and then discharged on dapto to complete 6 weeks until 11/13/21 ?After she went home she was doing fine for a week and then started doing stretching and started noticing pain left lumbar area. ?Also the rt calf I/D site had some foul smell with discharge after the sutures were removed. ?She also feels the left thigh site which has healed has pain and fullness. ?She saw ID physician yesterday at [facility] ?And his plan was to continue IV as planned  ? ? ?Subjective: ?Pt is doing well ?She says she is happy ?Pain  better controlled ?She has a pilonidal cyst that is draining ? ? ?Medications:  ? Chlorhexidine Gluconate Cloth  6 each Topical Daily  ? docusate sodium  100 mg Oral BID  ? feeding supplement  237 mL Oral BID BM  ? ferrous sulfate  325 mg Oral Q breakfast  ? nicotine  7 mg Transdermal Daily  ? ondansetron (ZOFRAN) IV  4 mg Intravenous Q8H  ? sertraline  25 mg Oral Daily  ? ? ?Objective: ?Vital signs in last 24 hours: ?Temp:  [97.5 ?F (36.4 ?C)-98.6 ?F (37 ?C)] 98.2 ?F (36.8 ?C) (04/21 4098) ?Pulse Rate:  [49-66] 66 (04/21 0836) ?Resp:  [15-22] 22 (04/21 0836) ?BP: (86-139)/(40-76) 116/59 (04/21 0836) ?SpO2:  [97 %-100 %] 97 % (04/21 0836) ? ?PHYSICAL EXAM:  ?General: Alert, cooperative, no distress, Lungs: b/la air entry ?Heart: Regular rate and rhythm, no murmur, rub or gallop. ?Abdomen: Soft, non-tender,not distended. Bowel sounds normal. No masses ?Extremities:rt calf- I/D site wound vac ?Left thigh I/D site wound vac ? ?Skin: pilonidal sinus wound-superficial ?Lymph: Cervical, supraclavicular normal. ?Neurologic: Grossly non-focal ? ?Lab Results ?Recent Labs  ?  10/31/21 ?1642 11/01/21 ?0108 11/01/21 ?0415 11/02/21 ?0540  ?WBC 7.6 7.3 7.0  --   ?HGB 10.1* 8.5* 8.6*  --   ?HCT 33.6* 28.5* 29.1*  --   ?NA 138  --  136  --   ?K 4.1  --  3.7  --   ?  CL 103  --  105  --   ?CO2 24  --  24  --   ?BUN 19  --  12  --   ?CREATININE 0.72 0.64 0.52 0.66  ? ?Liver Panel ?Recent Labs  ?  10/31/21 ?1642 11/01/21 ?0415  ?PROT 8.7* 7.5  ?ALBUMIN 3.7 3.3*  ?AST 19 15  ?ALT 13 12  ?ALKPHOS 70 58  ?BILITOT 0.5 0.7  ? ?Sedimentation Rate ?Recent Labs  ?  10/31/21 ?2323  ?ESRSEDRATE 79*  ? ?C-Reactive Protein ?Recent Labs  ?  10/31/21 ?2323  ?CRP 8.3*  ? ? 3/11, 3/12, 3/13 and 3/15 BC- MRSA ?3/16,3/17, 3/19  BC NG ?[facility] ?Rt calf wound culture sent ? ?Studies/Results: ?MR CERVICAL SPINE W WO CONTRAST ? ?Result Date: 11/03/2021 ?CLINICAL DATA:  Epidural abscess EXAM: MRI CERVICAL SPINE WITHOUT AND  WITH CONTRAST TECHNIQUE: Multiplanar and multiecho pulse sequences of the cervical spine, to include the craniocervical junction and cervicothoracic junction, were obtained without and with intravenous contrast. CONTRAST:  8mL GADAVIST GADOBUTROL 1 MMOL/ML IV SOLN COMPARISON:  09/27/2021 FINDINGS: Alignment: Physiologic. Vertebrae: No fracture, evidence of discitis, or bone lesion. Cord: Normal signal and morphology. There is mild residual contrast enhancement along the dorsal epidural surface without a space-occupying fluid collection. Posterior Fossa, vertebral arteries, paraspinal tissues: Negative. Disc levels: C1-2: Unremarkable. C2-3: Normal disc space and facet joints. There is no spinal canal stenosis. No neural foraminal stenosis. C3-4: Normal disc space and facet joints. There is no spinal canal stenosis. No neural foraminal stenosis. C4-5: Normal disc space and facet joints. There is no spinal canal stenosis. No neural foraminal stenosis. C5-6: Disc desiccation and minimal bulge. There is no spinal canal stenosis. No neural foraminal stenosis. C6-7: Small left subarticular disc protrusion. There is no spinal canal stenosis. No neural foraminal stenosis. C7-T1: Normal disc space and facet joints. There is no spinal canal stenosis. No neural foraminal stenosis. IMPRESSION: 1. Decreased dorsal epidural abscess with minimal residual phlegmon, measuring 1-2 mm. 2. Improved patency of the thecal sac. Electronically Signed   By: Deatra RobinsonKevin  Herman M.D.   On: 11/03/2021 01:16  ? ?US RT LOWER EXTREM LTD SOFT TISSUE NON VASCULAR ? ?Result Date: 11/01/2021 ?CLINICAL DATA:  Bilateral soft tissue abscesses status post drainage EXAM: ULTRASOUND LEFT LOWER EXTREMITY LIMITED ULTRASOUND RIGHT LOWER EXTREMITY LIMITED TECHNIQUE: Ultrasound examination of the lower extremity soft tissues was performed in the areas of clinical concern. COMPARISON:  10/01/2021 FINDINGS: Left lower extremity: Sonographic evaluation of the upper  anterolateral left thigh was performed at the site of prior abscess. Heterogeneous hypoechoic region along the lateral margin of the quadriceps musculature measures 16.2 x 3.2 x 7.2 cm, with increased peripheral color flow. There are small cystic fluid collections in this region of hypervascularity. Overall, findings are most consistent with myositis, with small focal abscesses. If further evaluation is desired, repeat MRI may yield additional information. Right lower extremity: In the right upper mid calf at the site of the previous abscess, there is a complex hypoechoic fluid collection measuring 5.1 x 2.2 x 15.0 cm, just deep to the subcutaneous fat. Increased vascularity surrounding the fluid collection. Overall, findings consistent with residual or recurrent abscess. IMPRESSION: 1. Complex hypervascular region in the anterolateral left thigh, consistent with residual myositis. Small fluid collections within the area of inflammation could reflect recurrent abscesses. Repeat MRI may yield additional information. 2. Recurrent or residual complex fluid collection in the right calf just deep to the subcutaneous fat,  consistent with abscess. Electronically Signed   By: Sharlet Salina M.D.   On: 11/01/2021 17:43  ? ?[place] LT LOWER EXTREM LTD SOFT TISSUE NON VASCULAR ? ?Result Date: 11/01/2021 ?CLINICAL DATA:  Bilateral soft tissue abscesses status post drainage EXAM: ULTRASOUND LEFT LOWER EXTREMITY LIMITED ULTRASOUND RIGHT LOWER EXTREMITY LIMITED TECHNIQUE: Ultrasound examination of the lower extremity soft tissues was performed in the areas of clinical concern. COMPARISON:  10/01/2021 FINDINGS: Left lower extremity: Sonographic evaluation of the upper anterolateral left thigh was performed at the site of prior abscess. Heterogeneous hypoechoic region along the lateral margin of the quadriceps musculature measures 16.2 x 3.2 x 7.2 cm, with increased peripheral color flow. There are small cystic fluid collections in this  region of hypervascularity. Overall, findings are most consistent with myositis, with small focal abscesses. If further evaluation is desired, repeat MRI may yield additional information. Right lower extremity

## 2021-11-03 NOTE — Progress Notes (Signed)
?PROGRESS NOTE ? ? ? Cristian Slavey  [id number] DOB: 23-Aug-1975 DOA: 10/31/2021 ?PCP: Bubba Camp, [place]  ? ? ?Brief Narrative:  ?46 year old with recent extensive hospitalization for MRSA bacteremia, multiple septic joints, epidural abscess treated conservatively and currently on IV daptomycin at home through 5/1 with a PICC line.  Followed up at ID clinic and deemed stable to complete antibiotics presented back to the ER after going home from clinic with worsening back pain. ?All cultures were cleared.  She was on IV daptomycin.   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 Admitted due to worsening back pain and suspected not relieving epidural abscess. ?Patient grew MRSA again. ? ? ?Assessment & Plan: ?  ?MRSA bacteremia, multiple bone and joint infection, epidural abscess: ?Blood cultures 3/12, 3/13, 3/15 positive for MRSA. ?Blood cultures 3/16, 3/20, negative.  Discharged home on daptomycin.  Multiple surgical procedures as below. ?Admission 4/18 ?Blood cultures 4/18. 1/2 bottles positive for MRSA. ? ?Currently remains on vancomycin and ceftaroline.  Followed by infectious disease. ?Source control, ?Persistent epidural abscess, followed by neurosurgery, scheduled for washout today.  Cervical spine with improving epidural abscess. ?Left thigh and right calf abscess, status post I&D and wound VAC placement.  Followed by orthopedics. ?PICC line removed. ?2D echocardiogram today.  Probably will need TEE due to widespread metastatic abscess once other source control is done. ?Found to have a small pilonidal sinus, without collection.  MRSA swab sent. ?Adequate pain medications.  Mobility to continue. ?Scheduled laxative. ? ?Smoker: Counseled to quit.  Currently on nicotine. ? ? ? ? ?DVT prophylaxis:   SCDs.  Scheduled for procedures. ? ? ?Code Status: Full code ?Family Communication: None at bedside ?Disposition  Plan: Status is: Inpatient ?Remains inpatient appropriate because: Significant infection, persistent MRSA infection. ?  ? ? ?Consultants:  ?Infectious disease ?Neurosurgery ?Orthopedics ?Interventional radiology ? ?Procedures:  ?None today ? ?Antimicrobials:  ?Vancomycin and ceftaroline. ? ? ?Subjective: ? ?Patient seen and examined in the morning rounds.  Seen and examined along with ID physician at bedside. ?Patient complained of less pain in her legs and, she was more energetic today. ?Patient has history of pilonidal sinus excision, recurrent with about 1 cm opening, not actively draining.  Examined with ID physician as chaperone at the bedside.  Pain is controlled with oxycodone and Flexeril.  She wants to use stool softener.  Remains afebrile. ? ?Scheduled neurosurgery procedure/transthoracic echocardiogram planned for today and probable need for TEE next week discussed, updated, consented for procedure by patient.  Patient is aware about all the procedures ongoing and results are updated. ? ?Objective: ?Vitals:  ? 11/02/21 1722 11/02/21 2123 11/03/21 [id number] 11/03/21 0836  ?BP: (!) 118/53 139/76 116/63 (!) 116/59  ?Pulse: 60 62 (!) 57 66  ?Resp: 16 16 16  (!) 22  ?Temp: 97.6 ?F (36.4 ?C) 97.8 ?F (36.6 ?C) (!) 97.5 ?F (36.4 ?C) 98.2 ?F (36.8 ?C)  ?TempSrc:  Oral Oral   ?SpO2: 100% 98% 99% 97%  ?Weight:      ?Height:      ? ? ?Intake/Output Summary (Last 24 hours) at 11/03/2021 1106 ?Last data filed at 11/03/2021 [phone] ?Gross per 24 hour  ?Intake 1928.63 ml  ?Output 460 ml  ?Net 1468.63 ml  ? ?Filed Weights  ? 11/01/21 1815  ?Weight: 82.1 kg  ? ? ?Examination: ? ?General exam: Appears calm and comfortable at rest.   ?Respiratory system: Clear to auscultation.  Respiratory effort normal.  No added sounds. ?Cardiovascular system: S1 & S2 heard, RRR. No pedal edema. ?Gastrointestinal system: Soft.  Nontender.  Bowel sound present.   ?Central nervous system: Alert and oriented. No focal neurological deficits. ?Mild  tenderness on the lower back, no visible swelling or deformity. ?Extremities: Symmetric 5 x 5 power. ?Skin:  ?Left thigh and right medial calf with wound VAC placed, minimal drainage.  Appropriately tender without any collectible abscess. ?Surgical scar on wrist is nontender. ?1 cm open pilonidal sinus with no collection or surrounding erythema. ? ?Data Reviewed: I have personally reviewed following labs and imaging studies ? ?CBC: ?Recent Labs  ?Lab 10/31/21 ?1642 11/01/21 ?0108 11/01/21 ?0415  ?WBC 7.6 7.3 7.0  ?NEUTROABS 5.5  --   --   ?HGB 10.1* 8.5* 8.6*  ?HCT 33.6* 28.5* 29.1*  ?MCV 88.0 88.8 88.4  ?PLT 377 323 304  ? ?Basic Metabolic Panel: ?Recent Labs  ?Lab 10/31/21 ?1642 11/01/21 ?0108 11/01/21 ?0415 11/02/21 ?0540  ?NA 138  --  136  --   ?K 4.1  --  3.7  --   ?CL 103  --  105  --   ?CO2 24  --  24  --   ?GLUCOSE 90  --  114*  --   ?BUN 19  --  12  --   ?CREATININE 0.72 0.64 0.52 0.66  ?CALCIUM 9.3  --  8.6*  --   ? ?GFR: ?Estimated Creatinine Clearance: 93.9 mL/min (by C-G formula based on SCr of 0.66 mg/dL). ?Liver Function Tests: ?Recent Labs  ?Lab 10/31/21 ?1642 11/01/21 ?0415  ?AST 19 15  ?ALT 13 12  ?ALKPHOS 70 58  ?BILITOT 0.5 0.7  ?PROT 8.7* 7.5  ?ALBUMIN 3.7 3.3*  ? ?No results for input(s): LIPASE, AMYLASE in the last 168 hours. ?No results for input(s): AMMONIA in the last 168 hours. ?Coagulation Profile: ?Recent Labs  ?Lab 11/02/21 ?0540  ?INR 1.2  ? ?Cardiac Enzymes: ?Recent Labs  ?Lab 11/01/21 ?0108  ?CKTOTAL 15*  ? ?BNP (last 3 results) ?No results for input(s): PROBNP in the last 8760 hours. ?HbA1C: ?Recent Labs  ?  11/01/21 ?0108  ?HGBA1C 5.3  ? ?CBG: ?No results for input(s): GLUCAP in the last 168 hours. ?Lipid Profile: ?No results for input(s): CHOL, HDL, LDLCALC, TRIG, CHOLHDL, LDLDIRECT in the last 72 hours. ?Thyroid Function Tests: ?Recent Labs  ?  11/01/21 ?0108  ?TSH 2.517  ?FREET4 0.67  ? ?Anemia Panel: ?Recent Labs  ?  11/01/21 ?0108 11/01/21 ?0415  ?[id number]  --  740  ?FOLATE  34.0  --   ?FERRITIN 432*  --   ?TIBC 253  --   ?IRON 24*  --   ?RETICCTPCT 3.0  --   ? ?Sepsis Labs: ?Recent Labs  ?Lab 11/01/21 ?0108 11/01/21 ?0415  ?LATICACIDVEN 0.9 0.5  ? ? ?Recent Results (from the past 240 hour(s))  ?Culture, blood (routine x 2)     Status: Abnormal (Preliminary result)  ? Collection Time: 10/31/21 11:23 PM  ? Specimen: BLOOD  ?Result Value Ref Range Status  ? Specimen Description   Final  ?  BLOOD RIGHT FOREARM ?Performed at [facility], [address] [place], [place] [address] ?  ? Special Requests   Final  ?  BOTTLES DRAWN AEROBIC AND ANAEROBIC Blood Culture results may not be optimal due to an excessive volume of blood received in culture bottles ?Performed at [facility], [address], [place], [place] [address] ?  ? Culture  Setup Time   Final  ?  Organism ID to follow ?GRAM POSITIVE COCCI ?IN BOTH AEROBIC AND ANAEROBIC BOTTLES ?CRITICAL RESULT CALLED TO, READ BACK BY AND VERIFIED WITH: NATHAN BELUTH @ 0008 ON 11/02/21.Marland KitchenMarland KitchenTKR ?GRAM STAIN REVIEWED-AGREE WITH RESULT ?Performed at [facility], [address], [place], [place] [address] ?  ? Culture (A)  Final  ?  STAPHYLOCOCCUS AUREUS ?SUSCEPTIBILITIES TO FOLLOW ?Performed at [facility], [place] [address], [place], [place] [address] ?  ? Report Status PENDING  Incomplete  ?Culture, blood (routine x 2)     Status: None (Preliminary result)  ? Collection Time: 10/31/21 11:23 PM  ? Specimen: BLOOD  ?Result Value Ref Range Status  ? Specimen Description   Final  ?  BLOOD LEFT ASSIST CONTROL ?Performed at [facility], [address], [place], [place] [address] ?  ? Special Requests   Final  ?  BOTTLES DRAWN AEROBIC AND ANAEROBIC Blood Culture results may not be optimal due to an excessive volume of blood received in culture bottles ?Performed at [facility], [address], [place], [place] [address] ?  ? Culture  Setup Time   Final  ?  GRAM POSITIVE COCCI ?AEROBIC BOTTLE  ONLY ?CRITICAL VALUE NOTED.  VALUE IS CONSISTENT WITH PREVIOUSLY REPORTED AND CALLED VALUE. ?GRAM STAIN REVIEWED-AGREE WITH RESULT ?  ? Culture   Final  ?  GRAM POSITIVE COCCI ?TOO YOUNG TO READ ?Performed at [place]

## 2021-11-03 NOTE — Progress Notes (Signed)
? ?  Subjective: ?1 Day Post-Op Procedure(s) (LRB): ?IRRIGATION AND DEBRIDEMENT WOUND LEFT THIGH, AND RIGHT CALF (Bilateral) ?Patient reports pain as mild. Pain much improved in both legs. ?Patient is well with no complaints.  Patient scheduled for back washout today. ?Denies any CP, SOB, ABD pain. ?We will continue therapy today.  ? ?Objective: ?Vital signs in last 24 hours: ?Temp:  [97.5 ?F (36.4 ?C)-98.6 ?F (37 ?C)] 97.5 ?F (36.4 ?C) (04/21 [id number]) ?Pulse Rate:  [49-66] 57 (04/21 0609) ?Resp:  [15-16] 16 (04/21 [id number]) ?BP: (86-139)/(40-76) 116/63 (04/21 [id number]) ?SpO2:  [98 %-100 %] 99 % (04/21 0609) ? ?Intake/Output from previous day: ?04/20 0701 - 04/21 0700 ?In: 1928.6 [P.O.:477; I.V.:600; IV Piggyback:851.6] ?Out: 460 [Urine:400; Drains:50; Blood:10] ?Intake/Output this shift: ?No intake/output data recorded. ? ?Recent Labs  ?  10/31/21 ?1642 11/01/21 ?0108 11/01/21 ?0415  ?HGB 10.1* 8.5* 8.6*  ? ?Recent Labs  ?  11/01/21 ?0108 11/01/21 ?0415  ?WBC 7.3 7.0  ?RBC 3.21*  3.21* 3.29*  ?HCT 28.5* 29.1*  ?PLT 323 304  ? ?Recent Labs  ?  10/31/21 ?1642 11/01/21 ?0108 11/01/21 ?0415 11/02/21 ?0540  ?NA 138  --  136  --   ?K 4.1  --  3.7  --   ?CL 103  --  105  --   ?CO2 24  --  24  --   ?BUN 19  --  12  --   ?CREATININE 0.72   < > 0.52 0.66  ?GLUCOSE 90  --  114*  --   ?CALCIUM 9.3  --  8.6*  --   ? < > = values in this interval not displayed.  ? ?Recent Labs  ?  11/02/21 ?0540  ?INR 1.2  ? ? ?EXAM ?General - Patient is Alert, Appropriate, and Oriented ?Extremity - Neurovascular intact ?Sensation intact distally ?Intact pulses distally ?Dorsiflexion/Plantar flexion intact ?Dressing - dressing C/D/I and no drainage, [place] intact to left and right lower extremity, less than 100 cc of bloody drainage present. ?Motor Function - intact, moving foot and toes well on exam.  ? ?Past Medical History:  ?Diagnosis Date  ? Depression   ? Engages in [place]   ? PONV (postoperative nausea and vomiting)   ? ? ?Assessment/Plan:   ?1  Day Post-Op Procedure(s) (LRB): ?IRRIGATION AND DEBRIDEMENT WOUND LEFT THIGH, AND RIGHT CALF (Bilateral) ?Principal Problem: ?  Back pain ?Active Problems: ?  MRSA bacteremia ?  Polyarthritis ?  Depression ?  Anemia ?  OM (osteomyelitis) ([place]) ?  Discitis ? ?Estimated body mass index is 29.23 kg/m? as calculated from the following: ?  Height as of this encounter: 5' 5.98" (1.676 m). ?  Weight as of this encounter: 82.1 kg. ?Advance diet ?Up with therapy ?Significant overall improvement in bilateral lower extremities from I&D. ?Continue with wound VAC.  Follow cultures.  Antibiotics per infectious disease. ? ? ? ? ?T. Rachelle Hora, PA-C ?[place] ?11/03/2021, 7:56 AM ?  ?

## 2021-11-03 NOTE — Plan of Care (Signed)
  Problem: Health Behavior/Discharge Planning: Goal: Ability to manage health-related needs will improve Outcome: Progressing   Problem: Clinical Measurements: Goal: Ability to maintain clinical measurements within normal limits will improve Outcome: Progressing   

## 2021-11-03 NOTE — Anesthesia Postprocedure Evaluation (Signed)
Anesthesia Post Note ? ?Patient: Jill Cox ? ?Procedure(s) Performed: IRRIGATION AND DEBRIDEMENT WOUND LEFT THIGH, AND RIGHT CALF (Bilateral) ? ?Patient location during evaluation: PACU ?Anesthesia Type: General ?Level of consciousness: awake and alert ?Pain management: pain level controlled ?Vital Signs Assessment: post-procedure vital signs reviewed and stable ?Respiratory status: spontaneous breathing, nonlabored ventilation, respiratory function stable and patient connected to nasal cannula oxygen ?Cardiovascular status: blood pressure returned to baseline and stable ?Postop Assessment: no apparent nausea or vomiting ?Anesthetic complications: no ? ? ?No notable events documented. ? ? ?Last Vitals:  ?Vitals:  ? 11/02/21 2123 11/03/21 0609  ?BP: 139/76 116/63  ?Pulse: 62 (!) 57  ?Resp: 16 16  ?Temp: 36.6 ?C (!) 36.4 ?C  ?SpO2: 98% 99%  ?  ?Last Pain:  ?Vitals:  ? 11/03/21 0609  ?TempSrc: Oral  ?PainSc:   ? ? ?  ?  ?  ?  ?  ?  ? ?Cleda Mccreedy [name] ? ? ? ? ?

## 2021-11-03 NOTE — Transfer of Care (Addendum)
Immediate Anesthesia Transfer of Care Note ? ?Patient: Jill Cox ? ?Procedure(s) Performed: L5-S1 EVACUATION OF EPIDURAL ABSCESS ? ?Patient Location: PACU ? ?Anesthesia Type:General ? ?Level of Consciousness: drowsy ? ?Airway & Oxygen Therapy: Patient Spontanous Breathing ? ?Post-op Assessment: Report given to RN ? ?Post vital signs: Reviewed and stable ? ?Last Vitals:  ?Vitals Value Taken Time  ?BP    ?Temp    ?Pulse    ?Resp    ?SpO2    ? ? ?Last Pain:  ?Vitals:  ? 11/03/21 1506  ?TempSrc: Temporal  ?PainSc:   ?   ? ?Patients Stated Pain Goal: 4 (11/03/21 0302) ? ?Complications: No notable events documented. ?

## 2021-11-03 NOTE — Progress Notes (Addendum)
Pharmacy Antibiotic Note ? ?Jill Cox is a 46 y.o. female admitted on 10/31/2021 with osteomyelitis.  Pharmacy has been consulted for Vancomycin dosing. ? ?Plan: ?Change vancomycin 750 mg IV Q8H to 1500 mg IV Q12h based on peak/trough.  ?New est AUC: 506 ? ?Pharmacy will continue to follow and will adjust abx dosing as warranted. ? ?Temp (24hrs), Avg:97.9 ?F (36.6 ?C), Min:97.5 ?F (36.4 ?C), Max:98.6 ?F (37 ?C) ? ?Recent Labs  ?Lab 10/31/21 ?1642 11/01/21 ?0108 11/01/21 ?0415 11/02/21 ?2979 11/02/21 ?2031 11/03/21 ?0134  ?WBC 7.6 7.3 7.0  --   --   --   ?CREATININE 0.72 0.64 0.52 0.66  --   --   ?LATICACIDVEN  --  0.9 0.5  --   --   --   ?VANCOTROUGH  --   --   --   --   --  10*  ?VANCOPEAK  --   --   --   --  21*  --   ? ?  ?Estimated Creatinine Clearance: 93.9 mL/min (by C-G formula based on SCr of 0.66 mg/dL).   ? ?No Known Allergies ? ?Antimicrobials this admission: ?4/19 Cefepime x 1 ?4/19 Vancomycin >>  ?4/20 Ceftaroline >>  ? ?Microbiology results: ?4/18 BCx: MRSA ?4/20 Wound: GPC ? ?Thank you for allowing pharmacy to be a part of this patient?s care. ? ?Raiford Noble, PharmD ?Clinical Pharmacist  ?11/03/2021 7:22 AM  ? ? ? ?

## 2021-11-03 NOTE — Interval H&P Note (Signed)
History and Physical Interval Note: ? ?11/03/2021 ?4:12 PM ? ?Jill Cox  has presented today for surgery, with the diagnosis of epidural abscess G06.1.  The various methods of treatment have been discussed with the patient and family. After consideration of risks, benefits and other options for treatment, the patient has consented to  Procedure(s): ?L5-S1 EVACUATION OF EPIDURAL ABSCESS (N/A) as a surgical intervention.  The patient's history has been reviewed, patient examined, no change in status, stable for surgery.  I have reviewed the patient's chart and labs.  Questions were answered to the patient's satisfaction.   ? ?Heart sounds normal no MRG. Chest Clear to Auscultation Bilaterally. ? ? ? ?[name] ? ? ?

## 2021-11-04 ENCOUNTER — Encounter: Payer: Self-pay | Admitting: Neurosurgery

## 2021-11-04 DIAGNOSIS — B9562 Methicillin resistant Staphylococcus aureus infection as the cause of diseases classified elsewhere: Secondary | ICD-10-CM | POA: Diagnosis not present

## 2021-11-04 DIAGNOSIS — M4647 Discitis, unspecified, lumbosacral region: Secondary | ICD-10-CM | POA: Diagnosis not present

## 2021-11-04 DIAGNOSIS — R7881 Bacteremia: Secondary | ICD-10-CM | POA: Diagnosis not present

## 2021-11-04 LAB — CREATININE, SERUM
Creatinine, Ser: 0.58 mg/dL (ref 0.44–1.00)
GFR, Estimated: >60 mL/min (ref >60)

## 2021-11-04 LAB — AEROBIC CULTURE W GRAM STAIN (SUPERFICIAL SPECIMEN)

## 2021-11-04 MED ORDER — VANCOMYCIN HCL 1500 MG/300ML IV SOLN
1500.0000 mg | Freq: Two times a day (BID) | INTRAVENOUS | Status: DC
  Filled 2021-11-04: qty 300

## 2021-11-04 MED ORDER — VANCOMYCIN HCL 1.5 G IV SOLR
1500.0000 mg | Freq: Two times a day (BID) | INTRAVENOUS | Status: AC
  Administered 2021-11-04 – 2021-11-07 (×7): 1500 mg via INTRAVENOUS
  Filled 2021-11-04 (×7): qty 1500

## 2021-11-04 MED ORDER — VANCOMYCIN HCL IN DEXTROSE 1-5 GM/200ML-% IV SOLN: 1000.0000 mg | Freq: Two times a day (BID) | INTRAVENOUS | Status: DC

## 2021-11-04 NOTE — Progress Notes (Signed)
? ?  Subjective: ?1 Day Post-Op Procedure(s) (LRB): ?L5-S1 EVACUATION OF EPIDURAL ABSCESS (N/A) ?Patient reports pain as improved. Pain much improved in both legs.  Less swelling.  Ready to try to walk. ?Patient is well with no complaints.  Patient scheduled for back washout today. ?Denies any CP, SOB, ABD pain. ?We will continue therapy today.  ? ?Objective: ?Vital signs in last 24 hours: ?Temp:  [97.4 ?F (36.3 ?C)-98.3 ?F (36.8 ?C)] 98 ?F (36.7 ?C) (04/22 0744) ?Pulse Rate:  [51-73] 52 (04/22 0744) ?Resp:  [13-22] 16 (04/22 0744) ?BP: (116-145)/(53-83) 135/64 (04/22 0744) ?SpO2:  [97 %-100 %] 100 % (04/22 0744) ? ?Intake/Output from previous day: ?04/21 0701 - 04/22 0700 ?In: 1250 [P.O.:150; I.V.:600; IV Piggyback:500] ?Out: 10 [Blood:10] ?Intake/Output this shift: ?No intake/output data recorded. ? ?No results for input(s): HGB in the last 72 hours. ? ?No results for input(s): WBC, RBC, HCT, PLT in the last 72 hours. ? ?Recent Labs  ?  11/02/21 ?[id number] 11/04/21 ?0409  ?CREATININE 0.66 0.58  ? ?Recent Labs  ?  11/02/21 ?0540  ?INR 1.2  ? ? ?EXAM ?General - Patient is Alert, Appropriate, and Oriented ?Extremity - Neurovascular intact ?Sensation intact distally ?Intact pulses distally ?Dorsiflexion/Plantar flexion intact ?Dressing - dressing C/D/I and no drainage, wound VAC intact to left and right lower extremity, less than 100 cc of bloody drainage present. ?Motor Function - intact, moving foot and toes well on exam.  ? ?Past Medical History:  ?Diagnosis Date  ? Depression   ? Engages in [place]   ? PONV (postoperative nausea and vomiting)   ? ? ?Assessment/Plan:   ?1 Day Post-Op Procedure(s) (LRB): ?L5-S1 EVACUATION OF EPIDURAL ABSCESS (N/A) ?Principal Problem: ?  Back pain ?Active Problems: ?  MRSA bacteremia ?  Polyarthritis ?  Depression ?  Anemia ?  OM (osteomyelitis) ([place]) ?  Discitis ? ?Estimated body mass index is 29.23 kg/m? as calculated from the following: ?  Height as of this encounter: 5' 5.98" (1.676  m). ?  Weight as of this encounter: 82.1 kg. ?Advance diet ?Up with therapy ?Significant overall improvement in bilateral lower extremities from I&D. ?Continue with wound VAC.  Follow cultures.  Antibiotics per infectious disease. ? ?Neurosurgery following lumbar spine epidural abscess evacuation.  Hemovac still intact. ? ?Reche Dixon, PA-C ?[place] ?11/04/2021, 8:07 AM ?  ?

## 2021-11-04 NOTE — Progress Notes (Signed)
? ?   Attending Progress Note ? ?History: Jill Cox is here for MRSA infection.  She has epidural abscesses in CTL spine, worst in L spine at L5-S1 level. ? ?POD1: Doing well.  No new symptoms ? ?Physical Exam: ?Vitals:  ? 11/04/21 0509 11/04/21 0744  ?BP: 127/71 135/64  ?Pulse: (!) 57 (!) 52  ?Resp: 16 16  ?Temp: 97.8 ?F (36.6 ?C) 98 ?F (36.7 ?C)  ?SpO2: 98% 100%  ? ? ?AA Ox3 ?CNI ? ?Strength:5/5 throughout BLE ?SILT ?Drain 0 ? ?Data: ? ?Recent Labs  ?Lab 10/31/21 ?1642 11/01/21 ?0108 11/01/21 ?0415 11/02/21 ?[id number] 11/04/21 ?0409  ?NA 138  --  136  --   --   ?K 4.1  --  3.7  --   --   ?CL 103  --  105  --   --   ?CO2 24  --  24  --   --   ?BUN 19  --  12  --   --   ?CREATININE 0.72   < > 0.52   < > 0.58  ?GLUCOSE 90  --  114*  --   --   ?CALCIUM 9.3  --  8.6*  --   --   ? < > = values in this interval not displayed.  ? ?Recent Labs  ?Lab 11/01/21 ?[id number]  ?AST 15  ?ALT 12  ?ALKPHOS 58  ?  ? Recent Labs  ?Lab 10/31/21 ?1642 11/01/21 ?0108 11/01/21 ?0415  ?WBC 7.6 7.3 7.0  ?HGB 10.1* 8.5* 8.6*  ?HCT 33.6* 28.5* 29.1*  ?PLT 377 323 304  ? ?Recent Labs  ?Lab 11/02/21 ?0540  ?INR 1.2  ?  ?   ? ? ?Other tests/results:  ? ?Cx pending from OR - GPC's on GS ? ?Assessment/Plan: ? ?Jill Cox is doing well from lumbar spine I and D for epidural abscess ? ?- mobilize with PTOT ?- pain control ?- DVT prophylaxis ok per my perspective ?- Continue drain ?- Watch for culture data ?- Abx per ID ? ? ?Meade Maw MD, MPHS ?Department of Neurosurgery ? ? ? ?

## 2021-11-04 NOTE — Progress Notes (Signed)
?PROGRESS NOTE ? ? ? Jill Cox  ZWC:[id number] DOB: [birth year] DOA: 10/31/2021 ?PCP: Cleon Dew, FNP  ? ? ?Brief Narrative:  ?46 year old with recent extensive hospitalization for MRSA bacteremia, multiple septic joints, epidural abscess treated conservatively and currently on IV daptomycin at home through 5/1 with a PICC line.  Followed up at ID clinic and deemed stable to complete antibiotics presented back to the ER after going home from clinic with worsening back pain. ?All cultures were cleared.  She was on IV daptomycin.   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 Admitted due to worsening back pain and suspected not relieving epidural abscess. ?Patient grew MRSA again. ? ? ?Assessment & Plan: ?  ?MRSA bacteremia, multiple bone and joint infection, epidural abscess: ?Blood cultures 3/12, 3/13, 3/15 positive for MRSA. ?Blood cultures 3/16, 3/20, negative.  Discharged home on daptomycin.  Multiple surgical procedures as below. ?Admission 4/18 ?Blood cultures 4/18. 1/2 bottles positive for MRSA. ?Repeat blood cultures ordered today 4/22, 2D echocardiogram today. ? ?Currently remains on vancomycin and ceftaroline.  Followed by infectious disease. ?Source control with,  ?Persistent epidural abscess, I&D, washout of the lumbar spine 4/21, followed by neurosurgery.  ?Left thigh and right calf abscess, status post I&D and wound VAC placement.  Followed by orthopedics. ?PICC line removed. ?2D echocardiogram today.  Probably will need TEE due to widespread metastatic abscess once other source control is done. ?Found to have a small pilonidal sinus, without collection.  MRSA swab negative so far. ?Adequate pain medications.  Mobility to continue. ?Scheduled stool softener. ? ?Smoker: Counseled to quit.  Currently on nicotine. ? ? ? ? ?DVT prophylaxis:   SCDs.  ? ? ?Code Status: Full code ?Family Communication:  None at bedside ?Disposition Plan: Status is: Inpatient ?Remains inpatient appropriate because: Significant infection, persistent MRSA infection. ?  ? ? ?Consultants:  ?Infectious disease ?Neurosurgery ?Orthopedics ?Interventional radiology ? ?Procedures:  ?None today ? ?Antimicrobials:  ?Vancomycin and ceftaroline. ? ? ?Subjective: ? ?Seen and examined.  Pain is controlled.  No other overnight events.  Afebrile. ? ?Objective: ?Vitals:  ? 11/03/21 2022 11/04/21 0007 11/04/21 0509 11/04/21 0744  ?BP: (!) 120/59 (!) 116/53 127/71 135/64  ?Pulse: (!) 51 (!) 54 (!) 57 (!) 52  ?Resp: 16  16 16   ?Temp: 98 ?F (36.7 ?C) 98 ?F (36.7 ?C) 97.8 ?F (36.6 ?C) 98 ?F (36.7 ?C)  ?TempSrc: Oral Oral Oral   ?SpO2: 97% 98% 98% 100%  ?Weight:      ?Height:      ? ? ?Intake/Output Summary (Last 24 hours) at 11/04/2021 1119 ?Last data filed at 11/03/2021 2242 ?Gross per 24 hour  ?Intake 1250 ml  ?Output 10 ml  ?Net 1240 ml  ? ?Filed Weights  ? 11/01/21 1815  ?Weight: 82.1 kg  ? ? ?Examination: ? ?General exam: Appears calm and comfortable at rest.  Eating breakfast. ?Respiratory system: Clear to auscultation. Respiratory effort normal.  No added sounds. ?Cardiovascular system: S1 & S2 heard, RRR.  ?Gastrointestinal system: Soft.  Nontender.  Bowel sound present.   ?Central nervous system: Alert and oriented. No focal neurological deficits. ?Extremities: Symmetric 5 x 5 power. ?Skin:  ?Lumbar spine drain with minimal bloody drain present. ?Left thigh and right medial calf with wound VAC placed, minimal drainage.  Appropriately tender without any collectible abscess. ?Surgical scar on wrist is nontender. ?1 cm open pilonidal sinus with no collection or surrounding  erythema. ? ?Data Reviewed: I have personally reviewed following labs and imaging studies ? ?CBC: ?Recent Labs  ?Lab 10/31/21 ?1642 11/01/21 ?0108 11/01/21 ?0415  ?WBC 7.6 7.3 7.0  ?NEUTROABS 5.5  --   --   ?HGB 10.1* 8.5* 8.6*  ?HCT 33.6* 28.5* 29.1*  ?MCV 88.0 88.8 88.4  ?PLT 377  323 304  ? ?Basic Metabolic Panel: ?Recent Labs  ?Lab 10/31/21 ?1642 11/01/21 ?0108 11/01/21 ?0415 11/02/21 ?[id number] 11/04/21 ?0409  ?NA 138  --  136  --   --   ?K 4.1  --  3.7  --   --   ?CL 103  --  105  --   --   ?CO2 24  --  24  --   --   ?GLUCOSE 90  --  114*  --   --   ?BUN 19  --  12  --   --   ?CREATININE 0.72 0.64 0.52 0.66 0.58  ?CALCIUM 9.3  --  8.6*  --   --   ? ?GFR: ?Estimated Creatinine Clearance: 93.9 mL/min (by C-G formula based on SCr of 0.58 mg/dL). ?Liver Function Tests: ?Recent Labs  ?Lab 10/31/21 ?1642 11/01/21 ?0415  ?AST 19 15  ?ALT 13 12  ?ALKPHOS 70 58  ?BILITOT 0.5 0.7  ?PROT 8.7* 7.5  ?ALBUMIN 3.7 3.3*  ? ?No results for input(s): LIPASE, AMYLASE in the last 168 hours. ?No results for input(s): AMMONIA in the last 168 hours. ?Coagulation Profile: ?Recent Labs  ?Lab 11/02/21 ?0540  ?INR 1.2  ? ?Cardiac Enzymes: ?Recent Labs  ?Lab 11/01/21 ?0108  ?CKTOTAL 15*  ? ?BNP (last 3 results) ?No results for input(s): PROBNP in the last 8760 hours. ?HbA1C: ?No results for input(s): HGBA1C in the last 72 hours. ? ?CBG: ?No results for input(s): GLUCAP in the last 168 hours. ?Lipid Profile: ?No results for input(s): CHOL, HDL, LDLCALC, TRIG, CHOLHDL, LDLDIRECT in the last 72 hours. ?Thyroid Function Tests: ?No results for input(s): TSH, T4TOTAL, FREET4, T3FREE, THYROIDAB in the last 72 hours. ? ?Anemia Panel: ?No results for input(s): VITAMINB12, FOLATE, FERRITIN, TIBC, IRON, RETICCTPCT in the last 72 hours. ? ?Sepsis Labs: ?Recent Labs  ?Lab 11/01/21 ?0108 11/01/21 ?0415  ?LATICACIDVEN 0.9 0.5  ? ? ?Recent Results (from the past 240 hour(s))  ?Culture, blood (routine x 2)     Status: Abnormal (Preliminary result)  ? Collection Time: 10/31/21 11:23 PM  ? Specimen: BLOOD  ?Result Value Ref Range Status  ? Specimen Description   Final  ?  BLOOD RIGHT FOREARM ?Performed at [facility], [address], [place], [place] [address] ?  ? Special Requests   Final  ?  BOTTLES DRAWN AEROBIC AND ANAEROBIC  Blood Culture results may not be optimal due to an excessive volume of blood received in culture bottles ?Performed at [facility], [address] [place], [place] [address] ?  ? Culture  Setup Time   Final  ?  GRAM POSITIVE COCCI ?IN BOTH AEROBIC AND ANAEROBIC BOTTLES ?CRITICAL RESULT CALLED TO, READ BACK BY AND VERIFIED WITH: NATHAN BELUTH @ 0008 ON 11/02/21.Marland KitchenMarland KitchenTKR ?GRAM STAIN REVIEWED-AGREE WITH RESULT ?  ? Culture (A)  Final  ?  STAPHYLOCOCCUS AUREUS ?REPEATING SUSCEPTIBILITIES ?Performed at [facility], [place] [address], [place],  [address] ?  ? Report Status PENDING  Incomplete  ?Culture, blood (routine x 2)     Status: None (Preliminary result)  ? Collection Time: 10/31/21 11:23 PM  ? Specimen: BLOOD  ?Result  Value Ref Range Status  ? Specimen Description   Final  ?  BLOOD LEFT ASSIST CONTROL ?Performed at [facility], [address], [place], [place] [address] ?  ? Special Requests   Final  ?  BOTTLES DRAWN AEROBIC AND ANAEROBIC Blood Culture results may not be optimal due to an excessive volume of blood received in culture bottles ?Performed at [facility], [address], [place], [place] [address] ?  ? Culture  Setup Time   Final  ?  GRAM POSITIVE COCCI ?AEROBIC BOTTLE ONLY ?CRITICAL VALUE NOTED.  VALUE IS CONSISTENT WITH PREVIOUSLY REPORTED AND CALLED VALUE. ?GRAM STAIN REVIEWED-AGREE WITH RESULT ?  ? Culture   Final  ?  GRAM POSITIVE COCCI ?IDENTIFICATION TO FOLLOW ?Performed at [facility], [place] [address] [place], [place] [address] ?  ? Report Status PENDING  Incomplete  ?Blood Culture ID Panel (Reflexed)     Status: Abnormal  ? Collection Time: 10/31/21 11:23 PM  ?Result Value Ref Range Status  ? Enterococcus faecalis NOT DETECTED NOT DETECTED Final  ? Enterococcus Faecium NOT DETECTED NOT DETECTED Final  ? Listeria monocytogenes NOT DETECTED NOT DETECTED Final  ? Staphylococcus species DETECTED (A) NOT DETECTED Final  ?  Comment: CRITICAL RESULT  CALLED TO, READ BACK BY AND VERIFIED WITH: ?NATHAN BELUTH @ 0008 ON 11/02/21.Marland KitchenMarland KitchenTKR ?  ? Staphylococcus aureus (BCID) DETECTED (A) NOT DETECTED Final  ?  Comment: Methicillin (oxacillin)-resistant Staphyloco

## 2021-11-04 NOTE — TOC CM/SW Note (Signed)
CSW new to this patient today. ?Per chart review, patient had home infusions in the past (1 month ago). Reached out to [facility] with [facility] to see if they are still active.  ? ?Per Carolynn Sayers, the plan is for patient to DC on Monday and [facility] will be providing home infusions. HHRN will be through [employer].  ? Oleh Genin, LCSW ?[phone] ? ?

## 2021-11-04 NOTE — Anesthesia Postprocedure Evaluation (Signed)
Anesthesia Post Note ? ?Patient: Jill Cox ? ?Procedure(s) Performed: L5-S1 EVACUATION OF EPIDURAL ABSCESS ? ?Patient location during evaluation: PACU ?Anesthesia Type: General ?Level of consciousness: awake and alert ?Pain management: pain level controlled ?Vital Signs Assessment: post-procedure vital signs reviewed and stable ?Respiratory status: spontaneous breathing, nonlabored ventilation and respiratory function stable ?Cardiovascular status: blood pressure returned to baseline and stable ?Postop Assessment: no apparent nausea or vomiting ?Anesthetic complications: no ? ? ?No notable events documented. ? ? ?Last Vitals:  ?Vitals:  ? 11/04/21 0007 11/04/21 0509  ?BP: (!) 116/53 127/71  ?Pulse: (!) 54 (!) 57  ?Resp:  16  ?Temp: 36.7 ?C 36.6 ?C  ?SpO2: 98% 98%  ?  ?Last Pain:  ?Vitals:  ? 11/04/21 0509  ?TempSrc: Oral  ?PainSc:   ? ? ?  ?  ?  ?  ?  ?  ? ?Foye Deer ? ? ? ? ?

## 2021-11-04 NOTE — Plan of Care (Signed)
  Problem: Health Behavior/Discharge Planning: Goal: Ability to manage health-related needs will improve Outcome: Progressing   

## 2021-11-05 ENCOUNTER — Inpatient Hospital Stay (HOSPITAL_COMMUNITY)
Admit: 2021-11-05 | Discharge: 2021-11-05 | Disposition: A | Discharge status: Home / Self Care | Payer: 59 | Attending: Internal Medicine | Admitting: Internal Medicine

## 2021-11-05 DIAGNOSIS — M4647 Discitis, unspecified, lumbosacral region: Secondary | ICD-10-CM | POA: Diagnosis not present

## 2021-11-05 DIAGNOSIS — R7881 Bacteremia: Secondary | ICD-10-CM

## 2021-11-05 DIAGNOSIS — M549 Dorsalgia, unspecified: Secondary | ICD-10-CM | POA: Diagnosis not present

## 2021-11-05 DIAGNOSIS — D649 Anemia, unspecified: Secondary | ICD-10-CM

## 2021-11-05 DIAGNOSIS — M130 Polyarthritis, unspecified: Secondary | ICD-10-CM

## 2021-11-05 LAB — CBC WITH DIFFERENTIAL/PLATELET
Abs Immature Granulocytes: 0.2 10*3/uL — ABNORMAL HIGH (ref 0.00–0.07)
Basophils Absolute: 0.1 10*3/uL (ref 0.0–0.1)
Basophils Relative: 1 %
Eosinophils Absolute: 0.2 10*3/uL (ref 0.0–0.5)
Eosinophils Relative: 2 %
HCT: 30 % — ABNORMAL LOW (ref 36.0–46.0)
Hemoglobin: 9 g/dL — ABNORMAL LOW (ref 12.0–15.0)
Immature Granulocytes: 3 %
Lymphocytes Relative: 17 %
Lymphs Abs: 1.2 10*3/uL (ref 0.7–4.0)
MCH: 26.3 pg (ref 26.0–34.0)
MCHC: 30 g/dL (ref 30.0–36.0)
MCV: 87.7 fL (ref 80.0–100.0)
Monocytes Absolute: 0.6 10*3/uL (ref 0.1–1.0)
Monocytes Relative: 8 %
Neutro Abs: 4.9 10*3/uL (ref 1.7–7.7)
Neutrophils Relative %: 69 %
Platelets: 326 10*3/uL (ref 150–400)
RBC: 3.42 MIL/uL — ABNORMAL LOW (ref 3.87–5.11)
RDW: 14.3 % (ref 11.5–15.5)
WBC: 7.1 10*3/uL (ref 4.0–10.5)
nRBC: 0 % (ref 0.0–0.2)

## 2021-11-05 LAB — QUANTIFERON-TB GOLD PLUS (RQFGPL)
QuantiFERON Mitogen Value: 8.42 IU/mL
QuantiFERON Nil Value: 0.02 IU/mL
QuantiFERON TB1 Ag Value: 0.01 IU/mL
QuantiFERON TB2 Ag Value: 0.01 IU/mL

## 2021-11-05 LAB — COMPREHENSIVE METABOLIC PANEL
ALT: 19 U/L (ref 0–44)
AST: 17 U/L (ref 15–41)
Albumin: 2.9 g/dL — ABNORMAL LOW (ref 3.5–5.0)
Alkaline Phosphatase: 53 U/L (ref 38–126)
Anion gap: 5 (ref 5–15)
BUN: 14 mg/dL (ref 6–20)
CO2: 24 mmol/L (ref 22–32)
Calcium: 8.4 mg/dL — ABNORMAL LOW (ref 8.9–10.3)
Chloride: 108 mmol/L (ref 98–111)
Creatinine, Ser: 0.75 mg/dL (ref 0.44–1.00)
GFR, Estimated: >60 mL/min (ref >60)
Glucose, Bld: 88 mg/dL (ref 70–99)
Potassium: 3.4 mmol/L — ABNORMAL LOW (ref 3.5–5.1)
Sodium: 137 mmol/L (ref 135–145)
Total Bilirubin: 0.5 mg/dL (ref 0.3–1.2)
Total Protein: 6.8 g/dL (ref 6.5–8.1)

## 2021-11-05 LAB — MAGNESIUM: Magnesium: 1.8 mg/dL (ref 1.7–2.4)

## 2021-11-05 LAB — QUANTIFERON-TB GOLD PLUS: QuantiFERON-TB Gold Plus: NEGATIVE

## 2021-11-05 LAB — AEROBIC CULTURE W GRAM STAIN (SUPERFICIAL SPECIMEN)

## 2021-11-05 LAB — EPSTEIN-BARR VIRUS (EBV) ANTIBODY PROFILE
EBV NA IgG: 123 U/mL — ABNORMAL HIGH (ref 0.0–17.9)
EBV VCA IgG: 211 U/mL — ABNORMAL HIGH (ref 0.0–17.9)
EBV VCA IgM: <36 U/mL (ref 0.0–35.9)

## 2021-11-05 MED ORDER — METOPROLOL TARTRATE 5 MG/5ML IV SOLN
5.0000 mg | INTRAVENOUS | Status: DC | PRN
Start: 2021-11-05 — End: 2021-11-12

## 2021-11-05 MED ORDER — HYDRALAZINE HCL 20 MG/ML IJ SOLN: 10.0000 mg | INTRAMUSCULAR | Status: DC | PRN

## 2021-11-05 MED ORDER — TRAZODONE HCL 50 MG PO TABS
50.0000 mg | ORAL_TABLET | Freq: Every evening | ORAL | Status: DC | PRN
Start: 2021-11-05 — End: 2021-11-12

## 2021-11-05 MED ORDER — HEPARIN SODIUM (PORCINE) 5000 UNIT/ML IJ SOLN
5000.0000 [IU] | Freq: Three times a day (TID) | INTRAMUSCULAR | Status: DC
  Administered 2021-11-05 – 2021-11-12 (×19): 5000 [IU] via SUBCUTANEOUS
  Filled 2021-11-05 (×20): qty 1

## 2021-11-05 MED ORDER — ACETAMINOPHEN 325 MG PO TABS: 650.0000 mg | ORAL_TABLET | Freq: Four times a day (QID) | ORAL | Status: DC | PRN

## 2021-11-05 MED ORDER — SENNOSIDES-DOCUSATE SODIUM 8.6-50 MG PO TABS
1.0000 | ORAL_TABLET | Freq: Every evening | ORAL | Status: DC | PRN
  Administered 2021-11-10: 1 via ORAL
  Filled 2021-11-05: qty 1

## 2021-11-05 MED ORDER — IPRATROPIUM-ALBUTEROL 0.5-2.5 (3) MG/3ML IN SOLN: 3.0000 mL | RESPIRATORY_TRACT | Status: DC | PRN

## 2021-11-05 NOTE — Progress Notes (Signed)
*  PRELIMINARY RESULTS* ?Echocardiogram ?2D Echocardiogram has been performed. ? ?Joelene Millin ?11/05/2021, 2:52 PM ?

## 2021-11-05 NOTE — Progress Notes (Signed)
? ?  Subjective: ?2 Days Post-Op Procedure(s) (LRB): ?L5-S1 EVACUATION OF EPIDURAL ABSCESS (N/A) ?3 Day Post-Op Procedure(s) (LRB): ?IRRIGATION AND DEBRIDEMENT WOUND LEFT THIGH, AND RIGHT CALF (Bilateral) ?Patient reports pain as improved. Pain much improved in both legs.  Less swelling.  Tried walking and is still concerned about her back, but legs are doing better. ?Patient is well with less back and leg pain, but still concerned about moving around with her lower back. ?Denies any CP, SOB, ABD pain. ?We will continue therapy today.  ? ?Objective: ?Vital signs in last 24 hours: ?Temp:  [98 ?F (36.7 ?C)-98.5 ?F (36.9 ?C)] 98 ?F (36.7 ?C) (04/23 0459) ?Pulse Rate:  [50-57] 50 (04/23 0459) ?Resp:  [16] 16 (04/23 0459) ?BP: (122-135)/(58-74) 131/68 (04/23 0459) ?SpO2:  [97 %-100 %] 100 % (04/23 0459) ? ?Intake/Output from previous day: ?04/22 0701 - 04/23 0700 ?In: 1500 [P.O.:900; IV Piggyback:600] ?Out: 0  ?Intake/Output this shift: ?No intake/output data recorded. ? ?Recent Labs  ?  11/05/21 ?[id number]  ?HGB 9.0*  ? ? ?Recent Labs  ?  11/05/21 ?[id number]  ?WBC 7.1  ?RBC 3.42*  ?HCT 30.0*  ?PLT 326  ? ? ?Recent Labs  ?  11/04/21 ?0409 11/05/21 ?[id number]  ?NA  --  137  ?K  --  3.4*  ?CL  --  108  ?CO2  --  24  ?BUN  --  14  ?CREATININE 0.58 0.75  ?GLUCOSE  --  88  ?CALCIUM  --  8.4*  ? ?No results for input(s): LABPT, INR in the last 72 hours. ? ? ?EXAM ?General - Patient is Alert, Appropriate, and Oriented ?Extremity - Neurovascular intact ?Sensation intact distally ?Intact pulses distally ?Dorsiflexion/Plantar flexion intact ?Dressing - dressing C/D/I and no drainage, wound VAC intact to left and right lower extremity, less than 10 cc of bloody drainage present. ?Motor Function - intact, moving foot and toes well on exam.  ? ?Past Medical History:  ?Diagnosis Date  ? Depression   ? Engages in [place]   ? PONV (postoperative nausea and vomiting)   ? ? ?Assessment/Plan:   ?2 Days Post-Op Procedure(s) (LRB): ?L5-S1 EVACUATION OF  EPIDURAL ABSCESS (N/A) ?Principal Problem: ?  Back pain ?Active Problems: ?  MRSA bacteremia ?  Polyarthritis ?  Depression ?  Anemia ?  OM (osteomyelitis) ([place]) ?  Discitis ? ?Estimated body mass index is 29.23 kg/m? as calculated from the following: ?  Height as of this encounter: 5' 5.98" (1.676 m). ?  Weight as of this encounter: 82.1 kg. ?Advance diet ?Up with therapy ?Significant overall improvement in bilateral lower extremities from I&D. ?Continue with wound VAC.  Follow cultures.  Antibiotics per infectious disease. ? ?Neurosurgery following lumbar spine epidural abscess evacuation.  Hemovac still intact. ? ?Reche Dixon, PA-C ?[place] ?11/05/2021, 7:25 AM ?  ?

## 2021-11-05 NOTE — Progress Notes (Signed)
?PROGRESS NOTE ? ? ? Jill Cox  LMB:[id number] DOB: 06/11/1976 DOA: 10/31/2021 ?PCP: Cleon Dew, FNP  ? ?Brief Narrative:  ?46 year old with recent extensive hospitalization for MRSA bacteremia, multiple septic joints, epidural abscess treated conservatively and currently on IV daptomycin at home through 5/1 with a PICC line.  Followed up at ID clinic and deemed stable to complete antibiotics presented back to the ER after going home from clinic with worsening back pain. ?All cultures were cleared.  She was on IV daptomycin.  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 Admitted due to worsening back pain and suspected not relieving epidural abscess. Patient grew MRSA again.  Patient underwent I&D of L5-S1 on 4/21.  Drain currently placed by neurosurgery, their team is following.  Also underwent incision and drainage by orthopedic on 4/20 for bilateral lower extremity abscess. ? ? ?Assessment & Plan: ? Principal Problem: ?  Back pain ?Active Problems: ?  OM (osteomyelitis) (HCC) ?  Discitis ?  Polyarthritis ?  MRSA bacteremia ?  Anemia ?  Depression ?  ? ?MRSA bacteremia of L5-S1 epidural abscess ?Left thigh and right calf abscess ?- Initially cultures were positive on 3/12 until 3/15 thereafter became negative and discharged home on daptomycin.  Readmitted on 4/18 due to positive bacteremia.  Underwent incision and drainage by neurosurgery on 4/21 with drain placement.  Currently on IV vancomycin and ceftaroline followed by infectious disease. ?-Status post incision and drainage by orthopedic of her bilateral lower extremity on 4/20. ?-Pain control ?Echocardiogram-pending ?-Blood cultures 4/22-NGTD ?PICC Removed.  ? ? ?Iron deficiency anemia ?- Iron supplements and bowel regimen ? ?Depression ?- Zoloft ?  ? ? ? ?DVT prophylaxis:   SQ Heparin ?Code Status: Full ?Family Communication:  Friend at bedside ? ?Status  is: Inpatient ?Remains inpatient appropriate because: IV Abx ? ? ? ? ?Subjective: ?Feels better no complaints.  ?Nsx removed drain from her back this morning  ? ? ? ? ?Examination: ? ?General exam: Appears calm and comfortable  ?Respiratory system: Clear to auscultation. Respiratory effort normal. ?Cardiovascular system: S1 & S2 heard, RRR. No JVD, murmurs, rubs, gallops or clicks. No pedal edema. ?Gastrointestinal system: Abdomen is nondistended, soft and nontender. No organomegaly or masses felt. Normal bowel sounds heard. ?Central nervous system: Alert and oriented. No focal neurological deficits. ?Extremities: Symmetric 5 x 5 power. ?Skin: b/l LE wound vac in place ?Psychiatry: Judgement and insight appear normal. Mood & affect appropriate.  ? ? ? ?Objective: ?Vitals:  ? 11/04/21 0744 11/04/21 1539 11/04/21 2040 11/05/21 0459  ?BP: 135/64 (!) 122/58 124/74 131/68  ?Pulse: (!) 52 (!) 57 (!) 57 (!) 50  ?Resp: 16 16  16   ?Temp: 98 ?F (36.7 ?C) 98.3 ?F (36.8 ?C) 98.5 ?F (36.9 ?C) 98 ?F (36.7 ?C)  ?TempSrc:  Oral Oral Oral  ?SpO2: 100% 98% 97% 100%  ?Weight:      ?Height:      ? ? ?Intake/Output Summary (Last 24 hours) at 11/05/2021 0730 ?Last data filed at 11/05/2021 11/07/2021 ?Gross per 24 hour  ?Intake 1500 ml  ?Output 0 ml  ?Net 1500 ml  ? ?Filed Weights  ? 11/01/21 1815  ?Weight: 82.1 kg  ? ? ? ?Data Reviewed:  ? ?CBC: ?Recent Labs  ?Lab 10/31/21 ?1642 11/01/21 ?0108 11/01/21 ?0415 11/05/21 ?11/07/21  ?WBC 7.6 7.3 7.0 7.1  ?NEUTROABS 5.5  --   --  4.9  ?HGB 10.1*  8.5* 8.6* 9.0*  ?HCT 33.6* 28.5* 29.1* 30.0*  ?MCV 88.0 88.8 88.4 87.7  ?PLT 377 323 304 326  ? ?Basic Metabolic Panel: ?Recent Labs  ?Lab 10/31/21 ?1642 11/01/21 ?0108 11/01/21 ?0415 11/02/21 ?[id number] 11/04/21 ?0409 11/05/21 ?[id number]  ?NA 138  --  136  --   --  137  ?K 4.1  --  3.7  --   --  3.4*  ?CL 103  --  105  --   --  108  ?CO2 24  --  24  --   --  24  ?GLUCOSE 90  --  114*  --   --  88  ?BUN 19  --  12  --   --  14  ?CREATININE 0.72 0.64 0.52 0.66 0.58 0.75   ?CALCIUM 9.3  --  8.6*  --   --  8.4*  ?MG  --   --   --   --   --  1.8  ? ?GFR: ?Estimated Creatinine Clearance: 93.9 mL/min (by C-G formula based on SCr of 0.75 mg/dL). ?Liver Function Tests: ?Recent Labs  ?Lab 10/31/21 ?1642 11/01/21 ?0415 11/05/21 ?[id number]  ?AST 19 15 17   ?ALT 13 12 19   ?ALKPHOS 70 58 53  ?BILITOT 0.5 0.7 0.5  ?PROT 8.7* 7.5 6.8  ?ALBUMIN 3.7 3.3* 2.9*  ? ?No results for input(s): LIPASE, AMYLASE in the last 168 hours. ?No results for input(s): AMMONIA in the last 168 hours. ?Coagulation Profile: ?Recent Labs  ?Lab 11/02/21 ?0540  ?INR 1.2  ? ?Cardiac Enzymes: ?Recent Labs  ?Lab 11/01/21 ?0108  ?CKTOTAL 15*  ? ?BNP (last 3 results) ?No results for input(s): PROBNP in the last 8760 hours. ?HbA1C: ?No results for input(s): HGBA1C in the last 72 hours. ?CBG: ?No results for input(s): GLUCAP in the last 168 hours. ?Lipid Profile: ?No results for input(s): CHOL, HDL, LDLCALC, TRIG, CHOLHDL, LDLDIRECT in the last 72 hours. ?Thyroid Function Tests: ?No results for input(s): TSH, T4TOTAL, FREET4, T3FREE, THYROIDAB in the last 72 hours. ?Anemia Panel: ?No results for input(s): VITAMINB12, FOLATE, FERRITIN, TIBC, IRON, RETICCTPCT in the last 72 hours. ?Sepsis Labs: ?Recent Labs  ?Lab 11/01/21 ?0108 11/01/21 ?0415  ?LATICACIDVEN 0.9 0.5  ? ? ?Recent Results (from the past 240 hour(s))  ?Culture, blood (routine x 2)     Status: Abnormal (Preliminary result)  ? Collection Time: 10/31/21 11:23 PM  ? Specimen: BLOOD  ?Result Value Ref Range Status  ? Specimen Description   Final  ?  BLOOD RIGHT FOREARM ?Performed at [facility], [address] [place], [place] [address] ?  ? Special Requests   Final  ?  BOTTLES DRAWN AEROBIC AND ANAEROBIC Blood Culture results may not be optimal due to an excessive volume of blood received in culture bottles ?Performed at [facility], [address] [place], [place] [address] ?  ? Culture  Setup Time   Final  ?  GRAM POSITIVE COCCI ?IN BOTH AEROBIC AND  ANAEROBIC BOTTLES ?CRITICAL RESULT CALLED TO, READ BACK BY AND VERIFIED WITH: NATHAN BELUTH @ 0008 ON 11/02/21.Marland Kitchen.Marland Kitchen.TKR ?GRAM STAIN REVIEWED-AGREE WITH RESULT ?  ? Culture (A)  Final  ?  STAPHYLOCOCCUS AUREUS ?REPEATING SUSCEPTIBILITIES ?Performed at [facility], [address] [place], [place] [address] ?  ? Report Status PENDING  Incomplete  ?Culture, blood (routine x 2)     Status: Abnormal (Preliminary result)  ? Collection Time: 10/31/21 11:23 PM  ? Specimen: BLOOD  ?Result Value Ref Range Status  ?  Specimen Description   Final  ?  BLOOD LEFT ASSIST CONTROL ?Performed at [facility], [address], [place], [place] [address] ?  ? Special Requests   Final  ?  BOTTLES DRAWN AEROBIC AND ANAEROBIC Blood Culture results may not be optimal due to an excessive volume of blood received in culture bottles ?Performed at [facility], [address] [place], [place] [address] ?  ? Culture  Setup Time   Final  ?  GRAM POSITIVE COCCI ?AEROBIC BOTTLE ONLY ?CRITICAL VALUE NOTED.  VALUE IS CONSISTENT WITH PREVIOUSLY REPORTED AND CALLED VALUE. ?GRAM STAIN REVIEWED-AGREE WITH RESULT ?  ? Culture (A)  Final  ?  STAPHYLOCOCCUS AUREUS ?CULTURE REINCUBATED FOR BETTER GROWTH ?Performed at [facility], [address] [place], [place] [address] ?  ? Report Status PENDING  Incomplete  ?Blood Culture ID Panel (Reflexed)     Status: Abnormal  ? Collection Time: 10/31/21 11:23 PM  ?Result Value Ref Range Status  ? Enterococcus faecalis NOT DETECTED NOT DETECTED Final  ? Enterococcus Faecium NOT DETECTED NOT DETECTED Final  ? Listeria monocytogenes NOT DETECTED NOT DETECTED Final  ? Staphylococcus species DETECTED (A) NOT DETECTED Final  ?  Comment: CRITICAL RESULT CALLED TO, READ BACK BY AND VERIFIED WITH: ?NATHAN BELUTH @ 0008 ON 11/02/21.Marland KitchenMarland KitchenTKR ?  ? Staphylococcus aureus (BCID) DETECTED (A) NOT DETECTED Final  ?  Comment: Methicillin (oxacillin)-resistant Staphylococcus aureus (MRSA). MRSA is predictably  resistant to beta-lactam antibiotics (except ceftaroline). Preferred therapy is vancomycin unless clinically contraindicated. Patient requires contact precautions if  ?hospitalized. ?CRITICAL RESULT CALLED

## 2021-11-05 NOTE — Progress Notes (Signed)
? ?   Attending Progress Note ? ?History: Kendrix Smithee is here for MRSA infection.  She has epidural abscesses in CTL spine, worst in L spine at L5-S1 level. ? ?POD2: No new symptoms. ? ?POD1: Doing well.  No new symptoms ? ?Physical Exam: ?Vitals:  ? 11/05/21 0459 11/05/21 0739  ?BP: 131/68 124/65  ?Pulse: (!) 50 (!) 55  ?Resp: 16 15  ?Temp: 98 ?F (36.7 ?C) 98.2 ?F (36.8 ?C)  ?SpO2: 100% 96%  ? ? ?AA Ox3 ?CNI ? ?Strength:5/5 throughout BLE ?SILT ?Drain 0 - removed ? ?Data: ? ?Recent Labs  ?Lab 10/31/21 ?1642 11/01/21 ?0108 11/01/21 ?0415 11/02/21 ?[id number] 11/05/21 ?[id number]  ?NA 138  --  136  --  137  ?K 4.1  --  3.7  --  3.4*  ?CL 103  --  105  --  108  ?CO2 24  --  24  --  24  ?BUN 19  --  12  --  14  ?CREATININE 0.72   < > 0.52   < > 0.75  ?GLUCOSE 90  --  114*  --  88  ?CALCIUM 9.3  --  8.6*  --  8.4*  ? < > = values in this interval not displayed.  ? ?Recent Labs  ?Lab 11/05/21 ?[id number]  ?AST 17  ?ALT 19  ?ALKPHOS 53  ?  ? Recent Labs  ?Lab 11/01/21 ?0108 11/01/21 ?0415 11/05/21 ?[id number]  ?WBC 7.3 7.0 7.1  ?HGB 8.5* 8.6* 9.0*  ?HCT 28.5* 29.1* 30.0*  ?PLT 323 304 326  ? ?Recent Labs  ?Lab 11/02/21 ?0540  ?INR 1.2  ?  ?   ? ? ?Other tests/results:  ? ?Cx pending from OR - GPC's on GS ? ?Assessment/Plan: ? ?Aprill Ramus is doing well from lumbar spine I and D for epidural abscess ? ?- mobilize with PTOT ?- pain control ?- DVT prophylaxis ok per my perspective ?- Drain removed ?- Watch for culture data ?- Abx per ID ? ? ?Meade Maw MD, MPHS ?Department of Neurosurgery ? ? ? ?

## 2021-11-05 NOTE — Progress Notes (Signed)
Physical Therapy Treatment ?Patient Details ?Name: Jill Cox ?MRN: [id number] ?DOB: 06-01-1976 ?Today's Date: 11/05/2021 ? ? ?History of Present Illness Pt admitted for back pain secondary to MRSA infection. Pt is POD 2 from L5-S1 evacuation of epidural abscess and POD 3 from I&D of L thigh and R calf with wound vacs. ? ?  ?PT Comments  ? ? Pt is making good progress towards goals with ability to ambulate out in hallway multiple laps. Pt still struggles with power/strength evident with difficulty with sit<>Stand transfer. RW used with improved balance as pt had 1 LOB ambulating without AD. Recommend to continue to use AD for mobility. Encouraged to sit in recliner for meals. Will continue to progress as able.   ?Recommendations for follow up therapy are one component of a multi-disciplinary discharge planning process, led by the attending physician.  Recommendations may be updated based on patient status, additional functional criteria and insurance authorization. ? ?Follow Up Recommendations ? Home health PT ?  ?  ?Assistance Recommended at Discharge Intermittent Supervision/Assistance  ?Patient can return home with the following A little help with walking and/or transfers;A little help with bathing/dressing/bathroom;Assist for transportation;Help with stairs or ramp for entrance ?  ?Equipment Recommendations ? None recommended by PT  ?  ?Recommendations for Other Services   ? ? ?  ?Precautions / Restrictions Precautions ?Precautions: Fall ?Restrictions ?Weight Bearing Restrictions: No  ?  ? ?Mobility ? Bed Mobility ?Overal bed mobility: Needs Assistance ?Bed Mobility: Supine to Sit, Sit to Supine ?  ?  ?Supine to sit: Min guard ?Sit to supine: Min guard ?  ?General bed mobility comments: improved technique with ability to come to sit at EOB with increased time ?  ? ?Transfers ?Overall transfer level: Needs assistance ?Equipment used: Rolling walker (2 wheels) ?Transfers: Sit to/from Stand ?Sit to Stand: Min  assist ?  ?  ?  ?  ?  ?General transfer comment: needs min assist for transfers due to B LE weakness. Cues for B UE. Further transfers performed with cga and manual cues for hips/knee ratio ?  ? ?Ambulation/Gait ?Ambulation/Gait assistance: Min guard ?Gait Distance (Feet): 400 Feet ?Assistive device: Rolling walker (2 wheels) ?Gait Pattern/deviations: Step-through pattern ?  ?  ?  ?General Gait Details: ambulated in hallway with RW. Slow speed, however no dizziness or rest breaks. ? ? ?Stairs ?  ?  ?  ?  ?  ? ? ?Wheelchair Mobility ?  ? ?Modified Rankin (Stroke Patients Only) ?  ? ? ?  ?Balance Overall balance assessment: Modified Independent ?  ?  ?  ?  ?  ?  ?  ?  ?  ?  ?  ?  ?  ?  ?  ?  ?  ?  ?  ? ?  ?Cognition Arousal/Alertness: Awake/alert ?Behavior During Therapy: [facility] for tasks assessed/performed ?Overall Cognitive Status: Within Functional Limits for tasks assessed ?  ?  ?  ?  ?  ?  ?  ?  ?  ?  ?  ?  ?  ?  ?  ?  ?General Comments: pleasant and agreeable to therapy ?  ?  ? ?  ?Exercises Other Exercises ?Other Exercises: able to ambulate to [facility] with cga. Has difficulty with standing from [facility] due to weakness. Needs supervisiion for hygiene ?Other Exercises: standing ther-ex performed including heel raises, toe raises, marching and 3 reps STS. cga given. Further ther-ex deferred as pt began getting dizzy and requested to return back to bed ? ?  ?  General Comments   ?  ?  ? ?Pertinent Vitals/Pain Pain Assessment ?Pain Assessment: Faces ?Faces Pain Scale: Hurts a little bit ?Pain Location: low back ?Pain Descriptors / Indicators: Operative site guarding ?Pain Intervention(s): Limited activity within patient's tolerance, Repositioned  ? ? ?Home Living   ?  ?  ?  ?  ?  ?  ?  ?  ?  ?   ?  ?Prior Function    ?  ?  ?   ? ?PT Goals (current goals can now be found in the care plan section) Acute Rehab PT Goals ?Patient Stated Goal: to go home ?PT Goal Formulation: With patient ?Time For Goal Achievement:  11/19/21 ?Potential to Achieve Goals: Good ?Progress towards PT goals: Progressing toward goals ? ?  ?Frequency ? ? ? Min 2X/week ? ? ? ?  ?PT Plan Current plan remains appropriate  ? ? ?Co-evaluation   ?  ?  ?  ?  ? ?  ?AM-PAC PT "6 Clicks" Mobility   ?Outcome Measure ? Help needed turning from your back to your side while in a flat bed without using bedrails?: None ?Help needed moving from lying on your back to sitting on the side of a flat bed without using bedrails?: A Little ?Help needed moving to and from a bed to a chair (including a wheelchair)?: A Little ?Help needed standing up from a chair using your arms (e.g., wheelchair or bedside chair)?: A Little ?Help needed to walk in hospital room?: A Little ?Help needed climbing 3-5 steps with a railing? : A Little ?6 Click Score: 19 ? ?  ?End of Session   ?Activity Tolerance: Patient limited by pain ?Patient left: in bed ?Nurse Communication: Mobility status ?PT Visit Diagnosis: Muscle weakness (generalized) (M62.81);Difficulty in walking, not elsewhere classified (R26.2);Pain ?Pain - Right/Left:  (bilat) ?Pain - part of body:  (back) ?  ? ? ?Time: [id number] ?PT Time Calculation (min) (ACUTE ONLY): 29 min ? ?Charges:  $Gait Training: 8-22 mins ?$Therapeutic Exercise: 8-22 mins          ?          ? ?Greggory Stallion, PT, DPT, GCS ?[phone] ? ? ? ?Jill Cox ?11/05/2021, 3:34 PM ? ?

## 2021-11-05 NOTE — Evaluation (Signed)
Physical Therapy Evaluation ?Patient Details ?Name: Jill Cox ?MRN: [id number] ?DOB: 1976/04/28 ?Today's Date: 11/05/2021 ? ?History of Present Illness ? Pt admitted for back pain secondary to MRSA infection. Pt is POD 2 from L5-S1 evacuation of epidural abscess and POD 3 from I&D of L thigh and R calf with wound vacs.  ?Clinical Impression ? Pt is a pleasant 46 year old female who was admitted for back pain secondary to MRSA infection. Pt is now s/p surgery. Pt received seated at EOB and able to assess seated balance/endurance. MD entered to remove drain and pt began feeling dizzy and nauseated. Cold rag and change of position improved symptoms. Pt doesn't feel up to mobilizing this session due to feeling poorly. Will need additional session to visualize mobility. Educated on benefits of PT and pt agreeable to HHPT. Pt performs bed mobility with min assist to return back supine. Pt demonstrates deficits with strength/pain/mobility/energy conservation. Would benefit from skilled PT to address above deficits and promote optimal return to PLOF. Recommend transition to [place] upon discharge from acute hospitalization. ? ?   ? ?Recommendations for follow up therapy are one component of a multi-disciplinary discharge planning process, led by the attending physician.  Recommendations may be updated based on patient status, additional functional criteria and insurance authorization. ? ?Follow Up Recommendations Home health PT ? ?  ?Assistance Recommended at Discharge Frequent or constant Supervision/Assistance  ?Patient can return home with the following ? A little help with walking and/or transfers;A little help with bathing/dressing/bathroom;Assist for transportation;Help with stairs or ramp for entrance ? ?  ?Equipment Recommendations None recommended by PT  ?Recommendations for Other Services ?    ?  ?Functional Status Assessment Patient has had a recent decline in their functional status and demonstrates the ability to  make significant improvements in function in a reasonable and predictable amount of time.  ? ?  ?Precautions / Restrictions Precautions ?Precautions: Fall ?Restrictions ?Weight Bearing Restrictions: No  ? ?  ? ?Mobility ? Bed Mobility ?Overal bed mobility: Needs Assistance ?Bed Mobility: Sit to Supine ?  ?  ?  ?Sit to supine: Min assist ?  ?General bed mobility comments: pt began feeling dizzy and faint after MD removed drain and requested to return supine. Needed min assist for sequencing and cues for returning back to bed. ?  ? ?Transfers ?  ?  ?  ?  ?  ?  ?  ?  ?  ?General transfer comment: unable to perform due to pt feeling nausea/dizzy ?  ? ?Ambulation/Gait ?  ?  ?  ?  ?  ?  ?  ?General Gait Details: per chart, pt has been supevision with ambualtion to bathroom. Will need to continue to assess ? ?Stairs ?  ?  ?  ?  ?  ? ?Wheelchair Mobility ?  ? ?Modified Rankin (Stroke Patients Only) ?  ? ?  ? ?Balance Overall balance assessment: Modified Independent ?  ?  ?  ?  ?  ?  ?  ?  ?  ?  ?  ?  ?  ?  ?  ?  ?  ?  ?   ? ? ? ?Pertinent Vitals/Pain Pain Assessment ?Pain Assessment: Faces ?Faces Pain Scale: Hurts even more ?Pain Location: low back ?Pain Descriptors / Indicators: Operative site guarding ?Pain Intervention(s): Limited activity within patient's tolerance, Repositioned  ? ? ?Home Living Family/patient expects to be discharged to:: Private residence ?Living Arrangements: Spouse/significant other ?Available Help at Discharge: Family;Available 24 hours/day ?  Type of Home: House ?Home Access: Stairs to enter ?Entrance Stairs-Rails: Right;Left;Can reach both ?Entrance Stairs-Number of Steps: 6 ?  ?Home Layout: Able to live on main level with bedroom/bathroom ?Home Equipment: Shower seat - built Medical sales representative (2 wheels) ?   ?  ?Prior Function Prior Level of Function : Independent/Modified Independent;Driving;Working/employed ?  ?  ?  ?  ?  ?  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  ? ? ?Hand Dominance  ?   ? ?  ?Extremity/Trunk Assessment  ? Upper Extremity Assessment ?Upper Extremity Assessment: Overall WFL for tasks assessed ?  ? ?Lower Extremity Assessment ?Lower Extremity Assessment: Generalized weakness (B LE grossly 4/5) ?  ? ?   ?Communication  ? Communication: No difficulties  ?Cognition Arousal/Alertness: Awake/alert ?Behavior During Therapy: [facility] for tasks assessed/performed ?Overall Cognitive Status: Within Functional Limits for tasks assessed ?  ?  ?  ?  ?  ?  ?  ?  ?  ?  ?  ?  ?  ?  ?  ?  ?General Comments: pleasant, received seated at EOB eating breakfast upon arrival ?  ?  ? ?  ?General Comments   ? ?  ?Exercises    ? ?Assessment/Plan  ?  ?PT Assessment Patient needs continued PT services  ?PT Problem List Decreased strength;Decreased activity tolerance;Decreased balance;Decreased mobility;Pain ? ?   ?  ?PT Treatment Interventions DME instruction;Gait training;Therapeutic exercise;Balance training   ? ?PT Goals (Current goals can be found in the Care Plan section)  ?Acute Rehab PT Goals ?Patient Stated Goal: to go home ?PT Goal Formulation: With patient ?Time For Goal Achievement: 11/19/21 ?Potential to Achieve Goals: Good ? ?  ?Frequency Min 2X/week ?  ? ? ?Co-evaluation   ?  ?  ?  ?  ? ? ?  ?AM-PAC PT "6 Clicks" Mobility  ?Outcome Measure Help needed turning from your back to your side while in a flat bed without using bedrails?: None ?Help needed moving from lying on your back to sitting on the side of a flat bed without using bedrails?: A Little ?Help needed moving to and from a bed to a chair (including a wheelchair)?: A Little ?Help needed standing up from a chair using your arms (e.g., wheelchair or bedside chair)?: A Little ?Help needed to walk in hospital room?: A Little ?Help needed climbing 3-5 steps with a railing? : A Little ?6 Click Score: 19 ? ?  ?End of Session    ?Activity Tolerance: Patient limited by pain ?Patient left: in bed ?Nurse Communication: Mobility status ?PT Visit Diagnosis: Muscle weakness (generalized) (M62.81);Difficulty in walking, not elsewhere classified (R26.2);Pain ?Pain - Right/Left:  (bilat) ?Pain - part of body:  (back) ?  ? ?Time: [id number] ?PT Time Calculation (min) (ACUTE ONLY): 22 min ? ? ?Charges:   PT Evaluation ?$PT Eval Low Complexity: 1 Low ?PT Treatments ?$Therapeutic Activity: 8-22 mins ?  ?   ? ? ?Greggory Stallion, PT, DPT, GCS ?[phone] ? ? ?Jimmi Sidener ?11/05/2021, 10:33 AM ? ?

## 2021-11-06 ENCOUNTER — Encounter: Payer: Self-pay | Admitting: Orthopedic Surgery

## 2021-11-06 DIAGNOSIS — R7881 Bacteremia: Secondary | ICD-10-CM | POA: Diagnosis not present

## 2021-11-06 DIAGNOSIS — M869 Osteomyelitis, unspecified: Secondary | ICD-10-CM

## 2021-11-06 DIAGNOSIS — B9562 Methicillin resistant Staphylococcus aureus infection as the cause of diseases classified elsewhere: Secondary | ICD-10-CM

## 2021-11-06 DIAGNOSIS — M4647 Discitis, unspecified, lumbosacral region: Secondary | ICD-10-CM | POA: Diagnosis not present

## 2021-11-06 DIAGNOSIS — M4620 Osteomyelitis of vertebra, site unspecified: Secondary | ICD-10-CM

## 2021-11-06 DIAGNOSIS — D649 Anemia, unspecified: Secondary | ICD-10-CM | POA: Diagnosis not present

## 2021-11-06 DIAGNOSIS — M549 Dorsalgia, unspecified: Secondary | ICD-10-CM | POA: Diagnosis not present

## 2021-11-06 LAB — COMPREHENSIVE METABOLIC PANEL
ALT: 22 U/L (ref 0–44)
AST: 18 U/L (ref 15–41)
Albumin: 2.8 g/dL — ABNORMAL LOW (ref 3.5–5.0)
Alkaline Phosphatase: 59 U/L (ref 38–126)
Anion gap: 8 (ref 5–15)
BUN: 14 mg/dL (ref 6–20)
CO2: 26 mmol/L (ref 22–32)
Calcium: 8.6 mg/dL — ABNORMAL LOW (ref 8.9–10.3)
Chloride: 105 mmol/L (ref 98–111)
Creatinine, Ser: 0.63 mg/dL (ref 0.44–1.00)
GFR, Estimated: >60 mL/min (ref >60)
Glucose, Bld: 98 mg/dL (ref 70–99)
Potassium: 3.6 mmol/L (ref 3.5–5.1)
Sodium: 139 mmol/L (ref 135–145)
Total Bilirubin: 0.6 mg/dL (ref 0.3–1.2)
Total Protein: 6.6 g/dL (ref 6.5–8.1)

## 2021-11-06 LAB — ECHOCARDIOGRAM COMPLETE
Area-P 1/2: 3.56 cm2
Height: 65.984 in
S' Lateral: 3.3 cm
Weight: 2896 oz

## 2021-11-06 LAB — MAGNESIUM: Magnesium: 1.8 mg/dL (ref 1.7–2.4)

## 2021-11-06 MED ORDER — POTASSIUM CHLORIDE CRYS ER 20 MEQ PO TBCR
40.0000 meq | EXTENDED_RELEASE_TABLET | Freq: Once | ORAL | Status: AC
  Administered 2021-11-06: 40 meq via ORAL
  Filled 2021-11-06: qty 2

## 2021-11-06 MED ORDER — SODIUM CHLORIDE 0.9 % IV SOLN: INTRAVENOUS | Status: DC

## 2021-11-06 NOTE — Progress Notes (Addendum)
? ?  Subjective: ?3 Days Post-Op Procedure(s) (LRB): ?L5-S1 EVACUATION OF EPIDURAL ABSCESS (N/A) ?Patient reports pain as mild. Pain much improved in both legs. ?Patient is well with no complaints.  ?Denies any CP, SOB, ABD pain. ?We will continue therapy today.  ? ?Objective: ?Vital signs in last 24 hours: ?Temp:  [98.1 ?F (36.7 ?C)-98.8 ?F (37.1 ?C)] 98.1 ?F (36.7 ?C) (04/24 0533) ?Pulse Rate:  [60-64] 60 (04/24 0533) ?Resp:  [16-17] 16 (04/24 0533) ?BP: (128-134)/(67-69) 134/69 (04/24 0533) ?SpO2:  [99 %-100 %] 100 % (04/24 0533) ? ?Intake/Output from previous day: ?04/23 0701 - 04/24 0700 ?In: 1271 [IV Piggyback:1271] ?Out: -  ?Intake/Output this shift: ?No intake/output data recorded. ? ?Recent Labs  ?  11/05/21 ?0254  ?HGB 9.0*  ? ?Recent Labs  ?  11/05/21 ?2706  ?WBC 7.1  ?RBC 3.42*  ?HCT 30.0*  ?PLT 326  ? ?Recent Labs  ?  11/05/21 ?2376 11/06/21 ?2831  ?NA 137 139  ?K 3.4* 3.6  ?CL 108 105  ?CO2 24 26  ?BUN 14 14  ?CREATININE 0.75 0.63  ?GLUCOSE 88 98  ?CALCIUM 8.4* 8.6*  ? ?No results for input(s): LABPT, INR in the last 72 hours. ? ? ?EXAM ?General - Patient is Alert, Appropriate, and Oriented ?Extremity - Neurovascular intact ?Sensation intact distally ?Intact pulses distally ?Dorsiflexion/Plantar flexion intact ?Dressing - dressing C/D/I and no drainage, Praveena intact to left and right lower extremity, less than 150 cc of bloody drainage present. ?Motor Function - intact, moving foot and toes well on exam.  ? ?Past Medical History:  ?Diagnosis Date  ? Depression   ? Engages in vaping   ? PONV (postoperative nausea and vomiting)   ? ? ?Assessment/Plan:   ?3 Days Post-Op Procedure(s) (LRB): ?L5-S1 EVACUATION OF EPIDURAL ABSCESS (N/A) ?Principal Problem: ?  Back pain ?Active Problems: ?  MRSA bacteremia ?  Polyarthritis ?  Depression ?  Anemia ?  OM (osteomyelitis) (HCC) ?  Discitis ? ?Estimated body mass index is 29.23 kg/m? as calculated from the following: ?  Height as of this encounter: 5' 5.98"  (1.676 m). ?  Weight as of this encounter: 82.1 kg. ?Advance diet ?Up with therapy ?Significant overall improvement in bilateral lower extremities from I&D. ?Continue with wound VAC.   ?Antibiotics per infectious disease. ? ? ? ? ?T. Cranston Neighbor, PA-C ?[facility] ?11/06/2021, 8:10 AM ?  ? ?Wound vacs on both legs changed with white foam deep.  There appears to be some healing already with less depth to the wounds.  There was some foul order which is expected with first change of wound VAC.  Plan on next change on Thursday. ?

## 2021-11-06 NOTE — Progress Notes (Signed)
Pharmacy Antibiotic Note ? ?Jill Cox is a 46 y.o. female admitted on 10/31/2021 with recurrent MRSA bacteremia while on daptomycin therapy following recent admission for disseminated MRSA bacteremia requiring I&D by ortho. She was found to have vertebral involvement with epidural abscess (no drainage performed). TEE was negative for endocarditis. She was progressing at home when she started to experience increasing back pain and drainage from calf at site of previous I&D.  Pharmacy has been consulted for Vancomycin dosing. ? ?11/02/21: Ortho I&D calf and thigh ?11/03/21: Neuro - drain epidural abscess ? ?Today, 11/06/2021 ?On vancomycin and ceftaroline  ?Cultures this admit ?4/18 Blood culture: MRSA - susc to daptomycin and ceftaroline sent [facility] ?4/20 Calf cx x 2: P. Mirabilis (pan susc) ?4/20 Thigh cx: NGTD ?4/21 pilondial abscess: P mirabilis ?4/21 epidural abscess: S. Aureus (susc pending) ?- renal function: stable ?- WBC: WNL ?- Afebrile ?- Vancomycin levels:  ?On vancomycin 750mg  IV q8h ?4/20 vanco peak at 20:31 = 21 mcg/ml ?4/21 vanc trough at 0134 = 10 mcg/mL  ? ?Plan: ?- Continue vancomycin 1500 mg IV Q12h based on peak/trough.  ?New est AUC: 506 ?- Plan to repeat vancomycin levels ?- Ceftaroline will cover the Proteus recovered in cultures.   ? ? ?Pharmacy will continue to follow and will adjust abx dosing as warranted. ? ?Temp (24hrs), Avg:98.2 ?F (36.8 ?C), Min:97.6 ?F (36.4 ?C), Max:98.8 ?F (37.1 ?C) ? ?Recent Labs  ?Lab 10/31/21 ?1642 11/01/21 ?0108 11/01/21 ?0415 11/02/21 ?[id number] 11/02/21 ?2031 11/03/21 ?0134 11/04/21 ?0409 11/05/21 ?[id number] 11/06/21 ?[id number]  ?WBC 7.6 7.3 7.0  --   --   --   --  7.1  --   ?CREATININE 0.72 0.64 0.52 0.66  --   --  0.58 0.75 0.63  ?LATICACIDVEN  --  0.9 0.5  --   --   --   --   --   --   ?[place]  --   --   --   --   --  10*  --   --   --   ?VANCOPEAK  --   --   --   --  21*  --   --   --   --   ? ?  ?Estimated Creatinine Clearance: 93.9 mL/min (by C-G formula based  on SCr of 0.63 mg/dL).   ? ?No Known Allergies ? ?Antimicrobials this admission: ?4/19 Cefepime x 1 ?4/19 Vancomycin >>  ?4/20 Ceftaroline >>  ? ?Microbiology results: ?See above ? ?Thank you for allowing pharmacy to be a part of this patient?s care. ? ?Doreene Eland, PharmD, BCPS, BCIDP ?Work Cell: [phone] ?11/06/2021 10:55 AM ? ? ? ? ? ?

## 2021-11-06 NOTE — Progress Notes (Signed)
?PROGRESS NOTE ? ? ? Jill Cox?Jill Cox  ZOX:[id number] DOB: 05/27/1976 DOA: 10/31/2021 ?PCP: Cleon DewMulholland, Andrea, FNP  ? ?Brief Narrative:  ?46 year old with recent extensive hospitalization for MRSA bacteremia, multiple septic joints, epidural abscess treated conservatively and currently on IV daptomycin at home through 5/1 with a PICC line.  Followed up at ID clinic and deemed stable to complete antibiotics presented back to the ER after going home from clinic with worsening back pain. ?All cultures were cleared.  She was on IV daptomycin.  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 Admitted due to worsening back pain and suspected not relieving epidural abscess. Patient grew MRSA again.  Patient underwent I&D of L5-S1 on 4/21.  Drain currently placed by neurosurgery, their team is following.  Also underwent incision and drainage by orthopedic on 4/20 for bilateral lower extremity abscess. ? ? ?Assessment & Plan: ? Principal Problem: ?  Back pain ?Active Problems: ?  OM (osteomyelitis) (HCC) ?  Discitis ?  Polyarthritis ?  MRSA bacteremia ?  Anemia ?  Depression ?  ? ?MRSA bacteremia of L5-S1 epidural abscess ?Left thigh and right calf abscess ?- Initially cultures were positive on 3/12 until 3/15 thereafter became negative and discharged home on daptomycin.  Readmitted on 4/18 due to positive bacteremia.  Underwent incision and drainage by neurosurgery on 4/21 with drain placement.  Currently on IV vancomycin and ceftaroline followed by infectious disease. ?-Status post incision and drainage by orthopedic of her bilateral lower extremity on 4/20. ?-Pain control ?Echocardiogram- No vegetations seen. Will likely need TEE. Would she benefit from CT A/P to look for any other source? Will defer this to ID for now.  ?-Blood cultures 4/22-NGTD ?PICC Removed.  ? ? ?Iron deficiency anemia ?- Iron supplements and bowel  regimen ? ?Depression ?- Zoloft ?  ? ? ? ?DVT prophylaxis: heparin injection 5,000 Units Start: 11/05/21 1400  SQ Heparin ?Code Status: Full ?Family Communication:  None at Bedside ? ?Status is: Inpatient ?Remains inpatient appropriate because: IV Abx ? ? ? ? ?Subjective: ?Feels ok, no cmplaints.  ?Ambulating in the hallway ? ? ?Examination: ? ?General exam: Appears calm and comfortable  ?Respiratory system: Clear to auscultation. Respiratory effort normal. ?Cardiovascular system: S1 & S2 heard, RRR. No JVD, murmurs, rubs, gallops or clicks. No pedal edema. ?Gastrointestinal system: Abdomen is nondistended, soft and nontender. No organomegaly or masses felt. Normal bowel sounds heard. ?Central nervous system: Alert and oriented. No focal neurological deficits. ?Extremities: Symmetric 5 x 5 power. ?Skin: b/l LE wound vac in place ?Psychiatry: Judgement and insight appear normal. Mood & affect appropriate.  ?Wound vac in place for LE.  ? ? ?Objective: ?Vitals:  ? 11/05/21 1729 11/05/21 2108 11/06/21 0533 11/06/21 0901  ?BP: 128/67  134/69 114/63  ?Pulse: 64 61 60 67  ?Resp: 17 16 16 18   ?Temp: 98.8 ?F (37.1 ?C) 98.3 ?F (36.8 ?C) 98.1 ?F (36.7 ?C) 97.6 ?F (36.4 ?C)  ?TempSrc:  Oral Oral Oral  ?SpO2: 99% 99% 100% 100%  ?Weight:      ?Height:      ? ? ?Intake/Output Summary (Last 24 hours) at 11/06/2021 1214 ?Last data filed at 11/06/2021 1112 ?Gross per 24 hour  ?Intake 1304.35 ml  ?Output --  ?Net 1304.35 ml  ? ?Filed Weights  ? 11/01/21 1815  ?Weight: 82.1 kg  ? ? ? ?Data Reviewed:  ? ?CBC: ?Recent Labs  ?Lab 10/31/21 ?  1642 11/01/21 ?0108 11/01/21 ?0415 11/05/21 ?7616  ?WBC 7.6 7.3 7.0 7.1  ?NEUTROABS 5.5  --   --  4.9  ?HGB 10.1* 8.5* 8.6* 9.0*  ?HCT 33.6* 28.5* 29.1* 30.0*  ?MCV 88.0 88.8 88.4 87.7  ?PLT 377 323 304 326  ? ?Basic Metabolic Panel: ?Recent Labs  ?Lab 10/31/21 ?1642 11/01/21 ?0108 11/01/21 ?0415 11/02/21 ?0737 11/04/21 ?0409 11/05/21 ?1062 11/06/21 ?6948  ?NA 138  --  136  --   --  137 139  ?K 4.1  --   3.7  --   --  3.4* 3.6  ?CL 103  --  105  --   --  108 105  ?CO2 24  --  24  --   --  24 26  ?GLUCOSE 90  --  114*  --   --  88 98  ?BUN 19  --  12  --   --  14 14  ?CREATININE 0.72   < > 0.52 0.66 0.58 0.75 0.63  ?CALCIUM 9.3  --  8.6*  --   --  8.4* 8.6*  ?MG  --   --   --   --   --  1.8 1.8  ? < > = values in this interval not displayed.  ? ?GFR: ?Estimated Creatinine Clearance: 93.9 mL/min (by C-G formula based on SCr of 0.63 mg/dL). ?Liver Function Tests: ?Recent Labs  ?Lab 10/31/21 ?1642 11/01/21 ?0415 11/05/21 ?5462 11/06/21 ?7035  ?AST 19 15 17 18   ?ALT 13 12 19 22   ?ALKPHOS 70 58 53 59  ?BILITOT 0.5 0.7 0.5 0.6  ?PROT 8.7* 7.5 6.8 6.6  ?ALBUMIN 3.7 3.3* 2.9* 2.8*  ? ?No results for input(s): LIPASE, AMYLASE in the last 168 hours. ?No results for input(s): AMMONIA in the last 168 hours. ?Coagulation Profile: ?Recent Labs  ?Lab 11/02/21 ?0540  ?INR 1.2  ? ?Cardiac Enzymes: ?Recent Labs  ?Lab 11/01/21 ?0108  ?CKTOTAL 15*  ? ?BNP (last 3 results) ?No results for input(s): PROBNP in the last 8760 hours. ?HbA1C: ?No results for input(s): HGBA1C in the last 72 hours. ?CBG: ?No results for input(s): GLUCAP in the last 168 hours. ?Lipid Profile: ?No results for input(s): CHOL, HDL, LDLCALC, TRIG, CHOLHDL, LDLDIRECT in the last 72 hours. ?Thyroid Function Tests: ?No results for input(s): TSH, T4TOTAL, FREET4, T3FREE, THYROIDAB in the last 72 hours. ?Anemia Panel: ?No results for input(s): VITAMINB12, FOLATE, FERRITIN, TIBC, IRON, RETICCTPCT in the last 72 hours. ?Sepsis Labs: ?Recent Labs  ?Lab 11/01/21 ?0108 11/01/21 ?0415  ?LATICACIDVEN 0.9 0.5  ? ? ?Recent Results (from the past 240 hour(s))  ?Culture, blood (routine x 2)     Status: Abnormal (Preliminary result)  ? Collection Time: 10/31/21 11:23 PM  ? Specimen: BLOOD  ?Result Value Ref Range Status  ? Specimen Description   Final  ?  BLOOD RIGHT FOREARM ?Performed at [facility], [address], [place], [address] [place] ?  ? Special Requests   Final   ?  BOTTLES DRAWN AEROBIC AND ANAEROBIC Blood Culture results may not be optimal due to an excessive volume of blood received in culture bottles ?Performed at [facility], [address], [place], [address] [place] ?  ? Culture  Setup Time   Final  ?  GRAM POSITIVE COCCI ?IN BOTH AEROBIC AND ANAEROBIC BOTTLES ?CRITICAL RESULT CALLED TO, READ BACK BY AND VERIFIED WITH: NATHAN BELUTH @ 0008 ON 11/02/[id number] ?GRAM STAIN REVIEWED-AGREE WITH RESULT ?  ? Culture (A)  Final  ?  STAPHYLOCOCCUS AUREUS ?Sent to [facility] for further susceptibility testing. ?Performed at [facility], [address] [place], [place] [address] ?  ? Report Status PENDING  Incomplete  ?Culture, blood (routine x 2)     Status: Abnormal  ? Collection Time: 10/31/21 11:23 PM  ? Specimen: BLOOD  ?Result Value Ref Range Status  ? Specimen Description   Final  ?  BLOOD LEFT ASSIST CONTROL ?Performed at [facility], [address], [place], [place] [address] ?  ? Special Requests   Final  ?  BOTTLES DRAWN AEROBIC AND ANAEROBIC Blood Culture results may not be optimal due to an excessive volume of blood received in culture bottles ?Performed at [facility], [address], [place], [place] [address] ?  ? Culture  Setup Time   Final  ?  GRAM POSITIVE COCCI ?AEROBIC BOTTLE ONLY ?CRITICAL VALUE NOTED.  VALUE IS CONSISTENT WITH PREVIOUSLY REPORTED AND CALLED VALUE. ?GRAM STAIN REVIEWED-AGREE WITH RESULT ?  ? Culture (A)  Final  ?  STAPHYLOCOCCUS AUREUS ?SUSCEPTIBILITIES PERFORMED ON PREVIOUS CULTURE WITHIN THE LAST 5 DAYS. ?Performed at [facility], [address] [place], [place] [address] ?  ? Report Status 11/05/2021 FINAL  Final  ?Blood Culture ID Panel (Reflexed)     Status: Abnormal  ? Collection Time: 10/31/21 11:23 PM  ?Result Value Ref Range Status  ? Enterococcus faecalis NOT DETECTED NOT DETECTED Final  ? Enterococcus Faecium NOT DETECTED NOT DETECTED Final  ? Listeria monocytogenes NOT DETECTED NOT  DETECTED Final  ? Staphylococcus species DETECTED (A) NOT DETECTED Final  ?  Comment: CRITICAL RESULT CALLED TO, READ BACK BY AND VERIFIED WITH: ?NATHAN BELUTH @ 0008 ON 11/02/21.Marland KitchenMarland KitchenTKR ?  ? Staphylococcus aureus (BCID)

## 2021-11-06 NOTE — Discharge Instructions (Signed)
?  Your surgeon has performed an operation on your lumbar spine (low back) to relieve pressure on one or more nerves. Many times, patients feel better immediately after surgery and can ?overdo it.? Even if you feel well, it is important that you follow these activity guidelines. If you do not let your back heal properly from the surgery, you can increase the chance of a disc herniation and/or return of your symptoms. The following are instructions to help in your recovery once you have been discharged from the hospital. ? ?Activity  ?  ?No bending, lifting, or twisting (?BLT?). Avoid lifting objects heavier than 10 pounds (gallon milk jug).  Where possible, avoid household activities that involve lifting, bending, pushing, or pulling such as laundry, vacuuming, grocery shopping, and childcare. Try to arrange for help from friends and family for these activities while your back heals. ? ?Increase physical activity slowly as tolerated.  Taking short walks is encouraged, but avoid strenuous exercise. Do not jog, run, bicycle, lift weights, or participate in any other exercises unless specifically allowed by your doctor. Avoid prolonged sitting, including car rides. ? ?Talk to your doctor before resuming sexual activity. ? ?You should not drive until cleared by your doctor. ? ?Until released by your doctor, you should not return to work or school.  You should rest at home and let your body heal.  ? ?You may shower two days after your surgery.  After showering, lightly dab your incision dry. Do not take a tub bath or go swimming for 3 weeks, or until approved by your doctor at your follow-up appointment. ? ?If you smoke, we strongly recommend that you quit.  Smoking has been proven to interfere with normal healing in your back and will dramatically reduce the success rate of your surgery. Please contact [facility] (800-QUIT-NOW) and use the resources at www.[facility].com for assistance in stopping smoking. ? ?Surgical  Incision ?  ?Keep your incision area clean and dry. ? ?Your incision was closed with Dermabond glue. The glue should begin to peel away within about a week. ? ?Diet          ? ? You may return to your usual diet. Be sure to stay hydrated. ? ?When to Contact us ? ?Although your surgery and recovery will likely be uneventful, you may have some residual numbness, aches, and pains in your back and/or legs. This is normal and should improve in the next few weeks. ? ?However, should you experience any of the following, contact us immediately: ?New numbness or weakness ?Pain that is progressively getting worse, and is not relieved by your pain medications or rest ?Bleeding, redness, swelling, pain, or drainage from surgical incision ?Chills or flu-like symptoms ?Fever greater than 101.0 F (38.3 C) ?Problems with bowel or bladder functions ?Difficulty breathing or shortness of breath ?Warmth, tenderness, or swelling in your calf ? ?Contact Information ?During office hours (Monday-Friday 9 am to 5 pm), please call your physician at [phone] ?After hours and weekends, please call [phone] and speak with the answering service, who will contact the doctor on call.  If that fails, call the [facility] Operator at [phone] and ask for the Neurosurgery Resident On Call  ?For a life-threatening emergency, call 911  ?

## 2021-11-06 NOTE — Progress Notes (Signed)
Physical Therapy Treatment ?Patient Details ?Name: Jill Cox ?MRN: [id number] ?DOB: May 09, 1976 ?Today's Date: 11/06/2021 ? ? ?History of Present Illness Pt admitted for back pain secondary to MRSA infection. Pt is POD 2 from L5-S1 evacuation of epidural abscess and POD 3 from I&D of L thigh and R calf with wound vacs. ? ?  ?PT Comments  ? ? Pt is making good progress towards goals with ability to ambulate around RN station. Still requires to use RW and demonstrates slight balance impairment, poor strength/power evident by slow sit<>Stand. Written HEP given and reviewed. Will continue to progress.   ?Recommendations for follow up therapy are one component of a multi-disciplinary discharge planning process, led by the attending physician.  Recommendations may be updated based on patient status, additional functional criteria and insurance authorization. ? ?Follow Up Recommendations ? Home health PT ?  ?  ?Assistance Recommended at Discharge Intermittent Supervision/Assistance  ?Patient can return home with the following A little help with walking and/or transfers;A little help with bathing/dressing/bathroom;Assist for transportation;Help with stairs or ramp for entrance ?  ?Equipment Recommendations ? None recommended by PT  ?  ?Recommendations for Other Services   ? ? ?  ?Precautions / Restrictions Precautions ?Precautions: Fall ?Restrictions ?Weight Bearing Restrictions: No  ?  ? ?Mobility ? Bed Mobility ?Overal bed mobility: Modified Independent ?Bed Mobility: Sit to Supine, Supine to Sit ?  ?  ?Supine to sit: Modified independent (Device/Increase time) ?Sit to supine: Modified independent (Device/Increase time) ?  ?General bed mobility comments: safe technique ?  ? ?Transfers ?Overall transfer level: Needs assistance ?Equipment used: Rolling walker (2 wheels) ?Transfers: Sit to/from Stand ?Sit to Stand: Min guard ?  ?  ?  ?  ?  ?General transfer comment: improved technique with cues for sequencing. Still needs  reminders to ant lean ?  ? ?Ambulation/Gait ?Ambulation/Gait assistance: Supervision ?Gait Distance (Feet): 400 Feet ?Assistive device: Rolling walker (2 wheels) ?Gait Pattern/deviations: Step-through pattern ?  ?  ?  ?General Gait Details: ambulated in hallway with slight fatigue. No dizziness. RW used with safe technique. Reports no pain ? ? ?Stairs ?  ?  ?  ?  ?  ? ? ?Wheelchair Mobility ?  ? ?Modified Rankin (Stroke Patients Only) ?  ? ? ?  ?Balance Overall balance assessment: Modified Independent ?  ?  ?  ?  ?  ?  ?  ?  ?  ?  ?  ?  ?  ?  ?  ?  ?  ?  ?  ? ?  ?Cognition Arousal/Alertness: Awake/alert ?Behavior During Therapy: Jill Cox for tasks assessed/performed ?Overall Cognitive Status: Within Functional Limits for tasks assessed ?  ?  ?  ?  ?  ?  ?  ?  ?  ?  ?  ?  ?  ?  ?  ?  ?General Comments: pleasant and agreeable to therapy ?  ?  ? ?  ?Exercises Other Exercises ?Other Exercises: written HEP given and reviewed for standing ther-ex. Also performed Cox reps of STS with slow technique and frequent cues for sequencing. ? ?  ?General Comments   ?  ?  ? ?Pertinent Vitals/Pain Pain Assessment ?Pain Assessment: No/denies pain  ? ? ?Home Living   ?  ?  ?  ?  ?  ?  ?  ?  ?  ?   ?  ?Prior Function    ?  ?  ?   ? ?PT Goals (current goals can now be found  in the care plan section) Acute Rehab PT Goals ?Patient Stated Goal: to go home ?PT Goal Formulation: With patient ?Time For Goal Achievement: 11/19/21 ?Potential to Achieve Goals: Good ?Progress towards PT goals: Progressing toward goals ? ?  ?Frequency ? ? ? Min 2X/week ? ? ? ?  ?PT Plan Current plan remains appropriate  ? ? ?Co-evaluation   ?  ?  ?  ?  ? ?  ?AM-PAC PT "6 Clicks" Mobility   ?Outcome Measure ? Help needed turning from your back to your side while in a flat bed without using bedrails?: None ?Help needed moving from lying on your back to sitting on the side of a flat bed without using bedrails?: None ?Help needed moving to and from a bed to a chair (including  a wheelchair)?: A Little ?Help needed standing up from a chair using your arms (e.g., wheelchair or bedside chair)?: A Little ?Help needed to walk in hospital room?: A Little ?Help needed climbing 3-Cox steps with a railing? : A Little ?6 Click Score: 20 ? ?  ?End of Session   ?Activity Tolerance: Patient tolerated treatment well ?Patient left: in bed ?Nurse Communication: Mobility status ?PT Visit Diagnosis: Muscle weakness (generalized) (M62.81);Difficulty in walking, not elsewhere classified (R26.2);Pain ?  ? ? ?Time: [id number] ?PT Time Calculation (min) (ACUTE ONLY): 34 min ? ?Charges:  $Gait Training: 8-22 mins ?$Therapeutic Exercise: 8-22 mins          ?          ? ?Jill Cox, PT, DPT, GCS ?[id number] ? ? ? ?Jill Cox ?11/06/2021, 1:34 PM ? ?

## 2021-11-06 NOTE — Progress Notes (Signed)
? ?   Attending Progress Note ? ?History: Alyce Crescenzo is here for MRSA infection.  She has epidural abscesses in CTL spine, worst in L spine at L5-S1 level. ? ?POD3: NAEO ? ?POD2: No new symptoms. ? ?POD1: Doing well.  No new symptoms ? ?Physical Exam: ?Vitals:  ? 11/06/21 0533 11/06/21 0901  ?BP: 134/69 114/63  ?Pulse: 60 67  ?Resp: 16 18  ?Temp: 98.1 ?F (36.7 ?C) 97.6 ?F (36.4 ?C)  ?SpO2: 100% 100%  ? ? ?AA Ox3 ?CNI ? ?Strength:5/5 throughout BLE ?SILT ?Incision c/d/I and open to air ? ?Data: ? ?Recent Labs  ?Lab 11/01/21 ?0415 11/02/21 ?[id number] 11/05/21 ?[id number] 11/06/21 ?[id number]  ?NA 136  --  137 139  ?K 3.7  --  3.4* 3.6  ?CL 105  --  108 105  ?CO2 24  --  24 26  ?BUN 12  --  14 14  ?CREATININE 0.52   < > 0.75 0.63  ?GLUCOSE 114*  --  88 98  ?CALCIUM 8.6*  --  8.4* 8.6*  ? < > = values in this interval not displayed.  ? ? ?Recent Labs  ?Lab 11/06/21 ?[id number]  ?AST 18  ?ALT 22  ?ALKPHOS 59  ? ?  ? Recent Labs  ?Lab 11/01/21 ?0108 11/01/21 ?0415 11/05/21 ?[id number]  ?WBC 7.3 7.0 7.1  ?HGB 8.5* 8.6* 9.0*  ?HCT 28.5* 29.1* 30.0*  ?PLT 323 304 326  ? ? ?Recent Labs  ?Lab 11/02/21 ?0540  ?INR 1.2  ? ?  ?   ? ? ?Other tests/results:  ? ?Cx pending from OR - GPC's on GS ? ?Assessment/Plan: ? ?Genella Schouten is doing well from lumbar spine I and D for epidural abscess ? ?- mobilize with PTOT ?- pain control ?- DVT prophylaxis ok per my perspective ?- Watch for culture data ?- Abx per ID ?- patient stable from a neurosurgical standpoint. Will sign off at this time. Please call with any questions or concerns. Pt needs 2 week f/u for incision check.  ? ?Cooper Render PA-C ?Department of Neurosurgery ? ? ? ?

## 2021-11-06 NOTE — Evaluation (Signed)
Occupational Therapy Evaluation ?Patient Details ?Name: Jill Cox ?MRN: [id number] ?DOB: 1975/07/27 ?Today's Date: 11/06/2021 ? ? ?History of Present Illness Pt admitted for back pain secondary to MRSA infection. Pt is POD 2 from L5-S1 evacuation of epidural abscess and POD 3 from I&D of L thigh and R calf with wound vacs.  ? ?Clinical Impression ?  ?Jill Cox presents with generalized weakness, greatly reduced endurance, and 6/10 pain in lower back. She reports that LE pain is much improved this day. Prior to recent illness/surgeries, pt was IND in functional mobility, physically active, employed full time. During today's evaluation, she displays LE weakness, requires increased time, effort, and SUPV for bed mobility and out-of-bed activity, requires RW for maintaining stable standing balance, and needs Min A for LB and UB dressing. After ~ 15 minutes OOB, pt requests to return to supine position, citing fatigue. Anticipate that pt will not need outpt or HHOT post DC, but can benefit from acute OT while hospitalized, to assist with increasing activity tolerance. Pt requests assistance with showering. This is cleared with orthopedic surgeon and neurosurgeon for next OT session. Pt states that she feels traumatized and demoralized by recent change in her health status and will want to engage in outpt therapy post DC. Recommend psych consult to obtain recommendations/referral for therapy. Per therapist recommendation, pt will check with her health insurance to see what they cover for [facility] services and what stipulations they specify for providers.  ? ?Recommendations for follow up therapy are one component of a multi-disciplinary discharge planning process, led by the attending physician.  Recommendations may be updated based on patient status, additional functional criteria and insurance authorization.  ? ?Follow Up Recommendations ? No OT follow up  ?  ?Assistance Recommended at Discharge PRN  ?Patient can return home  with the following A little help with walking and/or transfers;A little help with bathing/dressing/bathroom;Assistance with cooking/housework ? ?  ?Functional Status Assessment ? Patient has had a recent decline in their functional status and demonstrates the ability to make significant improvements in function in a reasonable and predictable amount of time.  ?Equipment Recommendations ? None recommended by OT  ?  ?Recommendations for Other Services Other (comment) (Pysch consult re: referral for outpt services) ? ? ?  ?Precautions / Restrictions Precautions ?Precautions: Fall ?Precaution Comments: Mod fall risk ?Restrictions ?Weight Bearing Restrictions: No  ? ?  ? ?Mobility Bed Mobility ?Overal bed mobility: Needs Assistance ?Bed Mobility: Supine to Sit, Sit to Supine ?  ?  ?Supine to sit: Supervision ?Sit to supine: Supervision ?  ?General bed mobility comments: increased time/effort, some grimacing with pain on exertion ?  ? ?Transfers ?Overall transfer level: Needs assistance ?Equipment used: Rolling walker (2 wheels) ?Transfers: Sit to/from Stand ?Sit to Stand: Min guard ?  ?  ?  ?  ?  ?General transfer comment: cueing for forward lean while powering up into standing ?  ? ?  ?Balance Overall balance assessment: Needs assistance ?Sitting-balance support: Bilateral upper extremity supported ?Sitting balance-Leahy Scale: Good ?  ?  ?Standing balance support: Bilateral upper extremity supported, During functional activity ?Standing balance-Leahy Scale: Good ?Standing balance comment: balance good but pt demonstrates weakness in standing ?  ?  ?  ?  ?  ?  ?  ?  ?  ?  ?  ?   ? ?ADL either performed or assessed with clinical judgement  ? ?ADL Overall ADL's : Needs assistance/impaired ?  ?  ?  ?  ?  ?  ?  ?  ?  Upper Body Dressing : Minimal assistance ?Upper Body Dressing Details (indicate cue type and reason): Assistance with managing sleeves on long-sleeved shirt ?  ?  ?  ?  ?  ?  ?  ?  ?Functional mobility during  ADLs: Rolling walker (2 wheels);Min guard ?   ? ? ? ?Vision   ?   ?   ?Perception   ?  ?Praxis   ?  ? ?Pertinent Vitals/Pain Pain Assessment ?Pain Assessment: 0-10 ?Pain Score: 6  ?Pain Location: low back ?Pain Descriptors / Indicators: Operative site guarding, Guarding, Discomfort ?Pain Intervention(s): Limited activity within patient's tolerance, Repositioned  ? ? ? ?Hand Dominance Left ?  ?Extremity/Trunk Assessment Upper Extremity Assessment ?Upper Extremity Assessment: Overall WFL for tasks assessed ?  ?Lower Extremity Assessment ?Lower Extremity Assessment: Generalized weakness ?  ?Cervical / Trunk Assessment ?Cervical / Trunk Assessment: Normal ?  ?Communication Communication ?Communication: No difficulties ?  ?Cognition Arousal/Alertness: Awake/alert ?Behavior During Therapy: [facility] for tasks assessed/performed ?Overall Cognitive Status: Within Functional Limits for tasks assessed ?  ?  ?  ?  ?  ?  ?  ?  ?  ?  ?  ?  ?  ?  ?  ?  ?General Comments: pleasant and agreeable to therapy ?  ?  ?General Comments    ? ?  ?Exercises Other Exercises ?Other Exercises: Educ re: ECS, HEP, gradual resumption of activities, DC recs, MH resources ?  ?Shoulder Instructions    ? ? ?Home Living Family/patient expects to be discharged to:: Private residence ?Living Arrangements: Spouse/significant other ?Available Help at Discharge: Family;Available 24 hours/day ?Type of Home: House ?Home Access: Stairs to enter ?Entrance Stairs-Number of Steps: 6 ?Entrance Stairs-Rails: Right;Left;Can reach both ?Home Layout: Able to live on main level with bedroom/bathroom ?  ?  ?Bathroom Shower/Tub: Walk-in shower ?  ?Bathroom Toilet: Standard ?  ?  ?Home Equipment: Shower seat - built Medical sales representative (2 wheels) ?  ?Additional Comments: Wooden shower bench that folds up and down. ?  ? ?  ?Prior Functioning/Environment Prior Level of Function : Independent/Modified Independent;Driving;Working/employed ?  ?  ?  ?  ?  ?  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  ? ?  ?  ?OT Problem List: Decreased strength;Decreased activity tolerance;Decreased knowledge of use of DME or AE;Pain ?  ?   ?OT Treatment/Interventions: Self-care/ADL training;Patient/family education;Therapeutic exercise;Balance training;Energy conservation;DME and/or AE instruction;Therapeutic activities  ?  ?OT Goals(Current goals can be found in the care plan section) Acute Rehab OT Goals ?Patient Stated Goal: to be stronger ?OT Goal Formulation: With patient ?Time For Goal Achievement: 11/20/21 ?Potential to Achieve Goals: Good ?ADL Goals ?Pt Will Perform Tub/Shower Transfer: Shower transfer;Independently;Stand pivot transfer;shower seat ?Pt/caregiver will Perform Home Exercise Program: Increased ROM;Increased strength;Independently ?Additional ADL Goal #1: Pt will identify/demonstrate 2+ energy conservation strategies  ?OT Frequency: Min 2X/week ?  ? ?Co-evaluation   ?  ?  ?  ?  ? ?  ?AM-PAC OT "6 Clicks" Daily Activity     ?Outcome Measure Help from another person eating meals?: None ?Help from another person taking care of personal grooming?: A Little ?Help from another person toileting, which includes using toliet, bedpan, or urinal?: A Little ?Help from another person bathing (including washing, rinsing, drying)?: A Little ?Help from another person to put on and taking off regular upper body clothing?: A Little ?Help from another person  to put on and taking off regular lower body clothing?: A Little ?6 Click Score: 19 ?  ?End of Session Equipment Utilized During Treatment: Rolling walker (2 wheels) ? ?Activity Tolerance: Patient tolerated treatment well ?Patient left: in bed;with call bell/phone within reach;with bed alarm set ? ?OT Visit Diagnosis: Muscle weakness (generalized) (M62.81);Unsteadiness on feet (R26.81)  ?              ?Time: [id number] ?OT Time  Calculation (min): 26 min ?Charges:  OT General Charges ?$OT Visit: 1 Visit ?OT Evaluation ?$OT Eval Moderate Complexity: 1 Mod ?OT Treatments ?$Self Care/Home Management : 23-37 mins ?Josiah Lobo, PhD,

## 2021-11-06 NOTE — Progress Notes (Signed)
? ?Date of Admission:  10/31/2021    ? ? ?ID: Jill Cox is a 46 y.o. female    ?Principal Problem: ?  Back pain ?Active Problems: ?  MRSA bacteremia ?  Polyarthritis ?  Depression ?  Anemia ?  OM (osteomyelitis) ([place]) ?  Discitis ? ? ? ?Jill Cox is a 46 y.o. 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  ?3/18  forearm I/D and 4th dorsal compartment extensor tenosynovectomy  culture positive for MRSA ?3/20 aspiration  rt knee- No mrsa ?3/20 Aspiration RT ankle - MRSA ?3/20 Rt calf- MRSA ?3/20 Aspiration left left Knee -No MRSA ?3/20 Left thigh abscess- -I/D MRSA ?  ?She also had extensive vertebral involvement ?Rt facet  joint involvement  at T2-T3 /T3-T4  ?Cervical spine- dorsal epidural collection of 5 mm extending C1-C7 with moderate stenosis at C4-5, C5-6, and C6-7 level ?  ?Lumbar MRI done without contrast on 09/28/21 collection at the left aspect of the thecal sac at L5, measuring approximately 10 x 11 x 23 ?Mm. L4-L5 disc involvement was questioned ?  ?  ?She was initially treated with dual MRSA antibiotic with ceftaroline + Dapto and then discharged on dapto to complete 6 weeks until 11/13/21 ?After she went home she was doing fine for a week and then started doing stretching and started noticing pain left lumbar area. ?Also the rt calf I/D site had some foul smell with discharge after the sutures were removed. ?She also feels the left thigh site which has healed has pain and fullness. ?She saw ID physician yesterday at [place] ?And his plan was to continue IV as planned  ? ? ?Subjective: ?Pt doing better than before ?Back pain  better ?Walked with PT ? ? ?Medications:  ? Chlorhexidine Gluconate Cloth  6 each Topical Daily  ? docusate sodium  100 mg Oral BID  ? feeding supplement  237 mL Oral BID BM  ? ferrous sulfate  325 mg Oral Q breakfast  ? heparin injection (subcutaneous)  5,000 Units Subcutaneous Q8H  ? nicotine  7 mg Transdermal Daily  ? ondansetron (ZOFRAN) IV  4 mg Intravenous Q8H  ? sertraline  25 mg Oral Daily  ? ? ?Objective: ?Vital signs in last 24 hours: ?Temp:  [97.6 ?F (36.4 ?C)-98.8 ?F (37.1 ?C)] 97.6 ?F (36.4 ?C) (04/24 0901) ?Pulse Rate:  [60-67] 67 (04/24 0901) ?Resp:  [16-18] 18 (04/24 0901) ?BP: (114-134)/(63-69) 114/63 (04/24 0901) ?SpO2:  [99 %-100 %] 100 % (04/24 0901) ? ?PHYSICAL EXAM:  ?General: Alert, cooperative, no distress, Lungs: b/la air entry ?Heart: Regular rate and rhythm, no murmur, rub or gallop. ?Abdomen: Soft, non-tender,not distended. Bowel sounds normal. No masses ?Extremities:rt calf- I/D site wound vac ?Left thigh I/D site wound vac ? ?Lymph: Cervical, supraclavicular normal. ?Neurologic: Grossly non-focal ? ?Lab Results ?Recent Labs  ?  11/05/21 ?[id number] 11/06/21 ?[id number]  ?WBC 7.1  --   ?HGB 9.0*  --   ?HCT 30.0*  --   ?NA 137 139  ?K 3.4* 3.6  ?CL 108 105  ?CO2 24 26  ?BUN 14 14  ?CREATININE 0.75 0.63  ? ?Liver Panel ?Recent Labs  ?  11/05/21 ?  [id number] 11/06/21 ?[id number]  ?PROT 6.8 6.6  ?ALBUMIN 2.9* 2.8*  ?AST 17 18  ?ALT 19 22  ?ALKPHOS 53 59  ?BILITOT 0.5 0.6  ? ?Sedimentation Rate ?No results for input(s): ESRSEDRATE in the last 72 hours. ? ?C-Reactive Protein ?No results for input(s): CRP in the last 72 hours. ? ? 3/11, 3/12, 3/13 and 3/15 BC- MRSA ?3/16,3/17, 3/19  BC NG ?Microbiology: ?[facility] 4/18 MRSA from PICC site ?Rt calf wound culture sent ? ?Studies/Results: ?ECHOCARDIOGRAM COMPLETE ? ?Result Date: 11/06/2021 ?   ECHOCARDIOGRAM REPORT   Patient Name:   Jill Cox Date of Exam: 11/05/2021 Medical Rec #:  [id number]   Height:       66.0 in Accession #:    [id number]  Weight:       181.0 lb Date of  Birth:  1976-02-12   BSA:          1.917 m? Patient Age:    1 years    BP:           124/65 mmHg Patient Gender: F           HR:           57 bpm. Exam Location:  [place] Procedure: 2D Echo, Cardiac Doppler and Color Doppler Indications:     Bacteremia R78.81  History:         Patient has prior history of Echocardiogram examinations, most                  recent 09/24/2021. Signs/Symptoms:Severe sepsis; Risk                  Factors:Tobacco use.  Sonographer:     Luane School RDCS Referring Phys:  [id number] Barb Merino Diagnosing Phys: Kathlyn Sacramento MD IMPRESSIONS  1. Left ventricular ejection fraction, by estimation, is 55 to 60%. The left ventricle has normal function. The left ventricle has no regional wall motion abnormalities. There is mild left ventricular hypertrophy. Left ventricular diastolic parameters were normal.  2. Right ventricular systolic function is normal. The right ventricular size is normal. There is normal pulmonary artery systolic pressure.  3. Left atrial size was mildly dilated.  4. The mitral valve is normal in structure. Trivial mitral valve regurgitation. No evidence of mitral stenosis.  5. The aortic valve is normal in structure. Aortic valve regurgitation is trivial. No aortic stenosis is present.  6. The inferior vena cava is normal in size with greater than 50% respiratory variability, suggesting right atrial pressure of 3 mmHg. Conclusion(s)/Recommendation(s): No evidence of valvular vegetations on this transthoracic echocardiogram. FINDINGS  Left Ventricle: Left ventricular ejection fraction, by estimation, is 55 to 60%. The left ventricle has normal function. The left ventricle has no regional wall motion abnormalities. The left ventricular internal cavity size was normal in size. There is  mild left ventricular hypertrophy. Left ventricular diastolic parameters were normal. Right Ventricle: The right ventricular size is normal. No increase in right ventricular wall thickness. Right  ventricular systolic function is normal. There is normal pulmonary artery systolic pressure. The tricuspid regurgitant velocity is 2.25 m/s, and  with an assumed right atrial pressure of 3 mmHg, the estimated right ventricular systolic pressure is 123XX123 mmHg. Left Atrium: Left atrial size was mildly dilated. Right Atrium: Right atrial size was normal in size. Pericardium: There is no evidence of pericardial effusion. Mitral Valve: The mitral valve is normal in structure. Trivial mitral valve regurgitation. No evidence of mitral valve stenosis. Tricuspid Valve: The tricuspid valve  is normal in structure. Tricuspid valve regurgitation is trivial. No evidence of tricuspid stenosis. Aortic Valve: The aortic valve is normal in structure. Aortic valve regurgitation is trivial. No aortic stenosis is present. Pulmonic Valve: The pulmonic valve was normal in structure. Pulmonic valve regurgitation is trivial. No evidence of pulmonic stenosis. Aorta: The aortic root is normal in size and structure. Venous: The inferior vena cava is normal in size with greater than 50% respiratory variability, suggesting right atrial pressure of 3 mmHg. IAS/Shunts: No atrial level shunt detected by color flow Doppler.  LEFT VENTRICLE PLAX 2D LVIDd:         4.70 cm   Diastology LVIDs:         3.30 cm   LV e' medial:    9.46 cm/s LV PW:         1.20 cm   LV E/e' medial:  12.6 LV IVS:        1.20 cm   LV e' lateral:   13.90 cm/s LVOT diam:     2.10 cm   LV E/e' lateral: 8.6 LV SV:         84 LV SV Index:   44 LVOT Area:     3.46 cm?  RIGHT VENTRICLE             IVC RV S prime:     13.80 cm/s  IVC diam: 1.60 cm TAPSE (M-mode): 2.7 cm LEFT ATRIUM             Index        RIGHT ATRIUM           Index LA diam:        4.10 cm 2.14 cm/m?   RA Area:     21.00 cm? LA Vol (A2C):   57.6 ml 30.05 ml/m?  RA Volume:   61.30 ml  31.98 ml/m? LA Vol (A4C):   95.2 ml 49.67 ml/m? LA Biplane Vol: 79.2 ml 41.32 ml/m?  AORTIC VALVE LVOT Vmax:   104.00 cm/s LVOT Vmean:   71.300 cm/s LVOT VTI:    0.242 m  AORTA Ao Root diam: 2.80 cm Ao Asc diam:  3.40 cm Ao Desc diam: 2.00 cm MITRAL VALVE                TRICUSPID VALVE MV Area (PHT): 3.56 cm?     TR Peak grad:   20.2 mmHg MV [place]

## 2021-11-07 ENCOUNTER — Inpatient Hospital Stay: Payer: 59

## 2021-11-07 ENCOUNTER — Ambulatory Visit: Payer: 59 | Admitting: Physical Therapy

## 2021-11-07 ENCOUNTER — Encounter
Admission: EM | Disposition: A | Discharge status: Home Health | Payer: Self-pay | Source: Home / Self Care | Attending: Osteopathic Medicine

## 2021-11-07 ENCOUNTER — Inpatient Hospital Stay (HOSPITAL_COMMUNITY)
Admit: 2021-11-07 | Discharge: 2021-11-07 | Disposition: A | Discharge status: Home / Self Care | Payer: 59 | Attending: Nurse Practitioner | Admitting: Nurse Practitioner

## 2021-11-07 DIAGNOSIS — M549 Dorsalgia, unspecified: Secondary | ICD-10-CM | POA: Diagnosis not present

## 2021-11-07 DIAGNOSIS — D649 Anemia, unspecified: Secondary | ICD-10-CM | POA: Diagnosis not present

## 2021-11-07 DIAGNOSIS — B9562 Methicillin resistant Staphylococcus aureus infection as the cause of diseases classified elsewhere: Secondary | ICD-10-CM | POA: Diagnosis not present

## 2021-11-07 DIAGNOSIS — M4656 Other infective spondylopathies, lumbar region: Secondary | ICD-10-CM | POA: Diagnosis not present

## 2021-11-07 DIAGNOSIS — I361 Nonrheumatic tricuspid (valve) insufficiency: Secondary | ICD-10-CM | POA: Diagnosis not present

## 2021-11-07 DIAGNOSIS — R7881 Bacteremia: Secondary | ICD-10-CM

## 2021-11-07 DIAGNOSIS — M869 Osteomyelitis, unspecified: Secondary | ICD-10-CM | POA: Diagnosis not present

## 2021-11-07 DIAGNOSIS — M4620 Osteomyelitis of vertebra, site unspecified: Secondary | ICD-10-CM | POA: Diagnosis not present

## 2021-11-07 HISTORY — PX: TEE WITHOUT CARDIOVERSION: SHX5443

## 2021-11-07 LAB — COMPREHENSIVE METABOLIC PANEL
ALT: 16 U/L (ref 0–44)
AST: 12 U/L — ABNORMAL LOW (ref 15–41)
Albumin: 2.9 g/dL — ABNORMAL LOW (ref 3.5–5.0)
Alkaline Phosphatase: 59 U/L (ref 38–126)
Anion gap: 4 — ABNORMAL LOW (ref 5–15)
BUN: 10 mg/dL (ref 6–20)
CO2: 27 mmol/L (ref 22–32)
Calcium: 8.6 mg/dL — ABNORMAL LOW (ref 8.9–10.3)
Chloride: 106 mmol/L (ref 98–111)
Creatinine, Ser: 0.61 mg/dL (ref 0.44–1.00)
GFR, Estimated: >60 mL/min (ref >60)
Glucose, Bld: 104 mg/dL — ABNORMAL HIGH (ref 70–99)
Potassium: 4.5 mmol/L (ref 3.5–5.1)
Sodium: 137 mmol/L (ref 135–145)
Total Bilirubin: 0.6 mg/dL (ref 0.3–1.2)
Total Protein: 6.6 g/dL (ref 6.5–8.1)

## 2021-11-07 LAB — AEROBIC/ANAEROBIC CULTURE W GRAM STAIN (SURGICAL/DEEP WOUND)
Culture: NO GROWTH
Culture: NO GROWTH
Gram Stain: NONE SEEN

## 2021-11-07 LAB — VANCOMYCIN, TROUGH: Vancomycin Tr: 19 ug/mL (ref 15–20)

## 2021-11-07 LAB — CBC
HCT: 29.5 % — ABNORMAL LOW (ref 36.0–46.0)
Hemoglobin: 9.1 g/dL — ABNORMAL LOW (ref 12.0–15.0)
MCH: 26.9 pg (ref 26.0–34.0)
MCHC: 30.8 g/dL (ref 30.0–36.0)
MCV: 87.3 fL (ref 80.0–100.0)
Platelets: 312 10*3/uL (ref 150–400)
RBC: 3.38 MIL/uL — ABNORMAL LOW (ref 3.87–5.11)
RDW: 15.1 % (ref 11.5–15.5)
WBC: 6.6 10*3/uL (ref 4.0–10.5)
nRBC: 0 % (ref 0.0–0.2)

## 2021-11-07 LAB — MAGNESIUM: Magnesium: 1.9 mg/dL (ref 1.7–2.4)

## 2021-11-07 LAB — VANCOMYCIN, PEAK: Vancomycin Pk: 36 ug/mL (ref 30–40)

## 2021-11-07 SURGERY — ECHOCARDIOGRAM, TRANSESOPHAGEAL
Anesthesia: Moderate Sedation

## 2021-11-07 MED ORDER — MIDAZOLAM HCL 2 MG/2ML IJ SOLN
INTRAMUSCULAR | Status: AC | PRN
  Administered 2021-11-07: 2 mg via INTRAVENOUS

## 2021-11-07 MED ORDER — SODIUM CHLORIDE FLUSH 0.9 % IV SOLN
INTRAVENOUS | Status: AC
  Filled 2021-11-07: qty 10

## 2021-11-07 MED ORDER — MIDAZOLAM HCL 2 MG/2ML IJ SOLN
INTRAMUSCULAR | Status: AC
Start: 2021-11-07 — End: 2021-11-08
  Filled 2021-11-07: qty 2

## 2021-11-07 MED ORDER — MORPHINE SULFATE (PF) 2 MG/ML IV SOLN
1.0000 mg | INTRAVENOUS | Status: DC | PRN
  Administered 2021-11-07 (×2): 1 mg via INTRAVENOUS
  Filled 2021-11-07 (×2): qty 1

## 2021-11-07 MED ORDER — VANCOMYCIN HCL IN DEXTROSE 1-5 GM/200ML-% IV SOLN
1000.0000 mg | Freq: Two times a day (BID) | INTRAVENOUS | Status: AC
  Administered 2021-11-08 – 2021-11-09 (×4): 1000 mg via INTRAVENOUS
  Filled 2021-11-07 (×4): qty 200

## 2021-11-07 MED ORDER — LORAZEPAM 2 MG/ML IJ SOLN
INTRAMUSCULAR | Status: AC | PRN
Start: 2021-11-07 — End: 2021-11-07
  Administered 2021-11-07: 2 mg via INTRAVENOUS

## 2021-11-07 MED ORDER — FENTANYL CITRATE (PF) 100 MCG/2ML IJ SOLN
INTRAMUSCULAR | Status: AC | PRN
  Administered 2021-11-07 (×2): 50 ug via INTRAVENOUS

## 2021-11-07 MED ORDER — LIDOCAINE VISCOUS HCL 2 % MT SOLN
OROMUCOSAL | Status: AC
  Filled 2021-11-07: qty 15

## 2021-11-07 MED ORDER — FENTANYL CITRATE (PF) 100 MCG/2ML IJ SOLN
INTRAMUSCULAR | Status: AC
  Filled 2021-11-07: qty 2

## 2021-11-07 MED ORDER — MIDAZOLAM HCL 2 MG/2ML IJ SOLN
INTRAMUSCULAR | Status: AC
  Filled 2021-11-07: qty 2

## 2021-11-07 MED ORDER — BUTAMBEN-TETRACAINE-BENZOCAINE 2-2-14 % EX AERO
INHALATION_SPRAY | CUTANEOUS | Status: AC
Start: 2021-11-07 — End: 2021-11-08
  Filled 2021-11-07: qty 5

## 2021-11-07 NOTE — Progress Notes (Signed)
PT Cancellation Note ? ?Patient Details ?Name: Jill Cox ?MRN: [id number] ?DOB: [birth year] ? ? ?Cancelled Treatment:    Reason Eval/Treat Not Completed: Patient at procedure or test/unavailable ?Pt unavailable, having TEE.  Will maintain on caseload and continue to follow/treat as appropriate and avilable. ? ?Kreg Shropshire, DPT ?11/07/2021, 1:51 PM ?

## 2021-11-07 NOTE — Progress Notes (Signed)
PT Cancellation Note ? ?Patient Details ?Name: Jill Cox ?MRN: [id number] ?DOB: [birth year] ? ? ?Cancelled Treatment:    Reason Eval/Treat Not Completed: Pain limiting ability to participate ?Chart reviewed, entered pt room with intention of having PT session.  Pt reports that the pain in her back is 10/10 today and that she would love to work with PT tomorrow but that she is just not able to today.  ? ?Kreg Shropshire, DPT ?11/07/2021, 3:28 PM ?

## 2021-11-07 NOTE — Progress Notes (Signed)
Transesophageal Echocardiogram : ? ?Indication: Bacteremia, rule out endocarditis ?Requesting/ordering  physician:  ? ?Procedure: Benzocaine spray x2 and 2 mls x 2 of viscous lidocaine were given orally to provide local anesthesia to the oropharynx. The patient was positioned supine on the left side, bite block provided. The patient was moderately sedated with the doses of versed and fentanyl as detailed below.  Using digital technique an omniplane probe was advanced into the distal esophagus without incident.  ? ?Moderate sedation: ?1. Sedation used:  Versed: 4 mg, Fentanyl: 100 ug ?2. Time administered: 1 PM   time when patient started recovery: 1:20 PM ?Total sedation time 20 minutes ?3. I was face to face during this time ? ?See report in EPIC  for complete details: ?In brief,  ?No valvular endocarditis ?transgastric imaging revealed normal LV function with no RWMAs and no mural apical thrombus.  .  Estimated ejection fraction was >55%.  Right sided cardiac chambers were normal with no evidence of pulmonary hypertension. ? ?Imaging of the septum showed no ASD or VSD ?Bubble study was negative for shunt ?2D and color flow confirmed no PFO ? ?The LA was well visualized in orthogonal views.  There was no spontaneous contrast and no thrombus in the LA and LA appendage  ? ?The descending thoracic aorta had no  mural aortic debris with no evidence of aneurysmal dilation or disection ? ? ?Ida Rogue ?11/07/2021 ?1:37 PM  ?

## 2021-11-07 NOTE — Progress Notes (Signed)
? ? ?  [facility] has been requested to perform a transesophageal echocardiogram on Jill Cox for bacteremia.  After careful review of history and examination, the risks and benefits of transesophageal echocardiogram have been explained including risks of esophageal damage, perforation (1:10,000 risk), bleeding, pharyngeal hematoma as well as other potential complications associated with conscious sedation including aspiration, arrhythmia, respiratory failure and death. Alternatives to treatment were discussed, questions were answered. Patient is willing to proceed.  Tentatively scheduled w/ Dr. Rockey Situ for 4/25 @ 12:30p. ? ?Murray Hodgkins, NP  ?11/07/2021 7:13 AM   ?

## 2021-11-07 NOTE — Progress Notes (Addendum)
? ?  Subjective: ?4 Days Post-Op  ?IRRIGATION AND DEBRIDEMENT WOUND LEFT THIGH, AND RIGHT CALF (Bilateral) - RIGHT LOWER LEG, LEFT UPPER LEG ?Patient reports pain as mild. Pain much improved in both legs. ?Patient is well with no complaints.  ?Denies any CP, SOB, ABD pain. ?We will continue therapy today.  ? ?Objective: ?Vital signs in last 24 hours: ?Temp:  [97.6 ?F (36.4 ?C)-99.1 ?F (37.3 ?C)] 98 ?F (36.7 ?C) (04/25 0459) ?Pulse Rate:  [62-70] 62 (04/25 0459) ?Resp:  [17-18] 18 (04/25 0459) ?BP: (114-138)/(62-70) 117/67 (04/25 0459) ?SpO2:  [97 %-100 %] 97 % (04/25 0459) ? ?Intake/Output from previous day: ?04/24 0701 - 04/25 0700 ?In: 2867.8 [P.O.:720; I.V.:23.2; IV Piggyback:2124.6] ?Out: -  ?Intake/Output this shift: ?No intake/output data recorded. ? ?Recent Labs  ?  11/05/21 ?[id number] 11/07/21 ?0442  ?HGB 9.0* 9.1*  ? ?Recent Labs  ?  11/05/21 ?[id number] 11/07/21 ?0442  ?WBC 7.1 6.6  ?RBC 3.42* 3.38*  ?HCT 30.0* 29.5*  ?PLT 326 312  ? ?Recent Labs  ?  11/06/21 ?0614 11/07/21 ?0442  ?NA 139 137  ?K 3.6 4.5  ?CL 105 106  ?CO2 26 27  ?BUN 14 10  ?CREATININE 0.63 0.61  ?GLUCOSE 98 104*  ?CALCIUM 8.6* 8.6*  ? ?No results for input(s): LABPT, INR in the last 72 hours. ? ? ?EXAM ?General - Patient is Alert, Appropriate, and Oriented ?Extremity - Neurovascular intact ?Sensation intact distally ?Intact pulses distally ?Dorsiflexion/Plantar flexion intact ?Dressing - dressing C/D/I and no drainage, Praveena intact to left and right lower extremity, less than 150 cc of bloody drainage present. Drainage stable ?Motor Function - intact, moving foot and toes well on exam.  ? ?Past Medical History:  ?Diagnosis Date  ? Depression   ? Engages in [place]   ? PONV (postoperative nausea and vomiting)   ? ? ?Assessment/Plan:   ?4 Days Post-Op Procedure(s) (LRB): ?L5-S1 EVACUATION OF EPIDURAL ABSCESS (N/A) ?Principal Problem: ?  Back pain ?Active Problems: ?  MRSA bacteremia ?  Polyarthritis ?  Depression ?  Anemia ?  OM (osteomyelitis)  ([place]) ?  Discitis ? ?Estimated body mass index is 29.23 kg/m? as calculated from the following: ?  Height as of this encounter: 5' 5.98" (1.676 m). ?  Weight as of this encounter: 82.1 kg. ?Advance diet ?Up with therapy ?Significant overall improvement in bilateral lower extremities from I&D. ?Continue with wound VAC.  Change wound vac every Monday Wednesday and Friday with white foam. ?Antibiotics per infectious disease. ?Follow up with [facility] orthopedics in 2 weeks for recheck ? ? ? ?T. Rachelle Hora, PA-C ?[place] ?11/07/2021, 8:57 AM ?  ? ? ?

## 2021-11-07 NOTE — TOC Progression Note (Signed)
Transition of Care (TOC) - Progression Note  ? ? ?Patient Details  ?Name: Jill Cox ?MRN: [id number] ?Date of Birth: 1976/06/20 ? ?Transition of Care (TOC) CM/SW Contact  ?Cherrell Maybee A Denson Niccoli, LCSW ?Phone Number: ?11/07/2021, 3:06 PM ? ?Clinical Narrative:   Wound vac order sent to [facility]. ? ? ? ?  ?  ? ?Expected Discharge Plan and Services ?  ?  ?  ?  ?  ?                ?  ?  ?  ?  ?  ?  ?  ?  ?  ?  ? ? ?Social Determinants of Health (SDOH) Interventions ?  ? ?Readmission Risk Interventions ?   ? View : No data to display.  ?  ?  ?  ? ? ?

## 2021-11-07 NOTE — Progress Notes (Signed)
*  PRELIMINARY RESULTS* ?Echocardiogram ?Echocardiogram Transesophageal has been performed. ? ?Kissa Campoy, [place] ?11/07/2021, 1:44 PM ?

## 2021-11-07 NOTE — Progress Notes (Signed)
Pharmacy Antibiotic Note ? ?Jill Cox is a 46 y.o. female admitted on 10/31/2021 with recurrent MRSA bacteremia while on daptomycin therapy following recent admission for disseminated MRSA bacteremia requiring I&D by ortho. She was found to have vertebral involvement with epidural abscess (no drainage performed). TEE was negative for endocarditis. She was progressing at home when she started to experience increasing back pain and drainage from calf at site of previous I&D.  Pharmacy has been consulted for Vancomycin dosing. ? ?11/02/21: Ortho I&D calf and thigh ?11/03/21: Neuro - drain epidural abscess ? ?Today, 11/07/2021 ?On vancomycin and ceftaroline  ?Cultures this admit ?4/18 Blood culture: MRSA - susc to daptomycin and ceftaroline sent [facility] ?4/20 Calf cx x 2: P. Mirabilis (pan susc) ?4/20 Thigh cx: NGTD ?4/21 pilondial abscess: P mirabilis ?4/21 epidural abscess: S. Aureus (susc pending) ?- renal function: stable ?- WBC: WNL ?- Afebrile ?- Vancomycin levels:  ?On vancomycin 750mg  IV q8h ?4/20 vanco peak at 20:31 = 21 mcg/ml ?4/21 vanc trough at 0134 = 10 mcg/mL  ? ?Plan: ?- Continue vancomycin 1500 mg IV Q12h based on peak/trough.  ?New est AUC: 506 ?- Plan to repeat vancomycin levels ?- Ceftaroline will cover the Proteus recovered in cultures.   ? ? ?4/25:  Vanc 1500 mg given on 4/25 @ 0040, Vanc peak scheduled for 0430 should have been drawn closer to 0300 so probably too late.  Will reorder vanc peak 30 min after next scheduled dose on 4/25 @ 1500.  ? ? ?Pharmacy will continue to follow and will adjust abx dosing as warranted. ? ?Temp (24hrs), Avg:98.4 ?F (36.9 ?C), Min:97.6 ?F (36.4 ?C), Max:99.1 ?F (37.3 ?C) ? ?Recent Labs  ?Lab 10/31/21 ?1642 11/01/21 ?0108 11/01/21 ?0415 11/02/21 ?[id number] 11/02/21 ?2031 11/03/21 ?0134 11/04/21 ?0409 11/05/21 ?[id number] 11/06/21 ?[id number]  ?WBC 7.6 7.3 7.0  --   --   --   --  7.1  --   ?CREATININE 0.72 0.64 0.52 0.66  --   --  0.58 0.75 0.63  ?LATICACIDVEN  --  0.9 0.5  --   --    --   --   --   --   ?[place]  --   --   --   --   --  10*  --   --   --   ?VANCOPEAK  --   --   --   --  21*  --   --   --   --   ? ?  ?Estimated Creatinine Clearance: 93.9 mL/min (by C-G formula based on SCr of 0.63 mg/dL).   ? ?No Known Allergies ? ?Antimicrobials this admission: ?4/19 Cefepime x 1 ?4/19 Vancomycin >>  ?4/20 Ceftaroline >>  ? ?Microbiology results: ?See above ? ?Thank you for allowing pharmacy to be a part of this patient?s care. ? ?Doreene Eland, PharmD, BCPS, BCIDP ?Work Cell: [phone] ?11/07/2021 4:33 AM ? ? ? ? ? ?

## 2021-11-07 NOTE — Progress Notes (Signed)
Pharmacy Antibiotic Note ? ?Jill Cox is a 46 y.o. female admitted on 10/31/2021 with recurrent MRSA bacteremia while on daptomycin therapy following recent admission for disseminated MRSA bacteremia requiring I&D by ortho. She was found to have vertebral involvement with epidural abscess (no drainage performed). TEE was negative for endocarditis. She was progressing at home when she started to experience increasing back pain and drainage from calf at site of previous I&D.  Pharmacy has been consulted for Vancomycin dosing. ? ?11/02/21: Ortho I&D calf and thigh ?11/03/21: Neuro - drain epidural abscess ? ?Today, 11/07/2021 ?On vancomycin and ceftaroline  ?Cultures this admit ?4/18 Blood culture: MRSA - susc to daptomycin and ceftaroline sent [facility] ?4/20 Calf cx x 2: P. Mirabilis (pan susc) ?4/20 Thigh cx: NGTD ?4/21 pilondial abscess: P mirabilis ?4/21 epidural abscess: S. Aureus (susc pending) ?- renal function: stable ?- WBC: WNL ?- Afebrile ?- Vancomycin levels:  ?On vancomycin 1500mg  IV q12h (dose at 00:40) over 2h infusion ?4/25 vanco peak at 04:42 = 36 mcg/mL ?4/25 vanco trough at 12:19 = 19 mcg/mL ?Calculated AUC = 701.6 ?On vancomycin 750mg  IV q8h ?4/20 vanco peak at 20:31 = 21 mcg/ml ?4/21 vanc trough at 0134 = 10 mcg/mL  ? ?Plan: ?- Based on today's (4/25) vancomycin levels, adjust vancomycin to 1gm IV q12h ?     - Calculated AUC 467.5 (goal 400-600)  ?- Ceftaroline will cover the Proteus recovered in cultures.   ?Pharmacy will continue to follow and will adjust abx dosing as warranted. ? ?Temp (24hrs), Avg:98.6 ?F (37 ?C), Min:98 ?F (36.7 ?C), Max:99.1 ?F (37.3 ?C) ? ?Recent Labs  ?Lab 10/31/21 ?1642 11/01/21 ?0108 11/01/21 ?0415 11/02/21 ?[id number] 11/02/21 ?2031 11/03/21 ?0134 11/04/21 ?0409 11/05/21 ?[id number] 11/06/21 ?[id number] 11/07/21 ?[id number] 11/07/21 ?1219  ?WBC 7.6 7.3 7.0  --   --   --   --  7.1  --  6.6  --   ?CREATININE 0.72 0.64 0.52 0.66  --   --  0.58 0.75 0.63 0.61  --   ?LATICACIDVEN  --  0.9 0.5  --    --   --   --   --   --   --   --   ?[place]  --   --   --   --   --  10*  --   --   --   --  19  ?VANCOPEAK  --   --   --   --  21*  --   --   --   --  36  --   ? ?  ?Estimated Creatinine Clearance: 93.9 mL/min (by C-G formula based on SCr of 0.61 mg/dL).   ? ?No Known Allergies ? ?Antimicrobials this admission: ?4/19 Cefepime x 1 ?4/19 Vancomycin >>  ?4/20 Ceftaroline >>  ? ?Microbiology results: ?See above ? ?Thank you for allowing pharmacy to be a part of this patient?s care. ? ?Doreene Eland, PharmD, BCPS, BCIDP ?Work Cell: [phone] ?11/07/2021 12:55 PM ? ? ? ? ? ?

## 2021-11-07 NOTE — Progress Notes (Signed)
?PROGRESS NOTE ? ? ? Jill Cox  [id number] DOB: [birth year] DOA: 10/31/2021 ?PCP: Bubba Camp, [place]  ? ?Brief Narrative:  ?46 year old with recent extensive hospitalization for MRSA bacteremia, multiple septic joints, epidural abscess treated conservatively and currently on IV daptomycin at home through 5/1 with a PICC line.  Followed up at ID clinic and deemed stable to complete antibiotics presented back to the ER after going home from clinic with worsening back pain. ?All cultures were cleared.  She was on IV daptomycin.  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 Admitted due to worsening back pain and suspected not relieving epidural abscess. Patient grew MRSA again.  Patient underwent I&D of L5-S1 on 4/21.  Drain currently placed by neurosurgery, their team is following.  Also underwent incision and drainage by orthopedic on 4/20 for bilateral lower extremity abscess.  Echocardiogram was negative therefore TEE scheduled. ? ? ?Assessment & Plan: ? Principal Problem: ?  Back pain ?Active Problems: ?  OM (osteomyelitis) ([place]) ?  Discitis ?  Polyarthritis ?  MRSA bacteremia ?  Anemia ?  Depression ?  ? ?MRSA bacteremia of L5-S1 epidural abscess ?Left thigh and right calf abscess ?Pilonidal sinus ?- Initially cultures were positive on 3/12 until 3/15 thereafter became negative and discharged home on daptomycin.  Readmitted on 4/18 due to positive bacteremia.  Underwent incision and drainage by neurosurgery on 4/21 with drain placement.  Currently on IV vancomycin and ceftaroline followed by infectious disease. ?-Her lumbar drain was removed by neurosurgery 4/24. ?-Status post incision and drainage by orthopedic of her bilateral lower extremity on 4/20.  Managed by orthopedic ?-Pain control ?Echocardiogram- No vegetations seen.  Tentative plans for TEE today. ?-Blood cultures 4/22-NGTD ?PICC Removed.  Can be  placed again closer to discharge ? ? ?Iron deficiency anemia ?- Iron supplements and bowel regimen ? ?Depression ?- Zoloft ?  ? ? ? ?DVT prophylaxis: heparin injection 5,000 Units Start: 11/05/21 1400  SQ Heparin ?Code Status: Full ?Family Communication: None at bedside ? ?Status is: Inpatient ?Remains inpatient appropriate because: IV Abx, plans for TEE ? ? ? ? ?Subjective: ?Patient adamant this morning to get her p.o. pain medication but I explained to her that she is n.p.o. for her TEE.  She understands this.  No other complaints at this time. ?Examination: ?Constitutional: Not in acute distress ?Respiratory: Clear to auscultation bilaterally ?Cardiovascular: Normal sinus rhythm, no rubs ?Abdomen: Nontender nondistended good bowel sounds ?Musculoskeletal: No edema noted ?Skin: No rashes seen ?Neurologic: CN 2-12 grossly intact.  And nonfocal ?Psychiatric: Normal judgment and insight. Alert and oriented x 3. Normal mood. ? ?Wound vac in place for LE.  ? ? ?Objective: ?Vitals:  ? 11/06/21 0901 11/06/21 1617 11/06/21 2013 11/07/21 0459  ?BP: 114/63 138/70 123/62 117/67  ?Pulse: 67 70 66 62  ?Resp: 18 17 18 18   ?Temp: 97.6 ?F (36.4 ?C) 99.1 ?F (37.3 ?C) 98.6 ?F (37 ?C) 98 ?F (36.7 ?C)  ?TempSrc: Oral Oral    ?SpO2: 100% 100% 97% 97%  ?Weight:      ?Height:      ? ? ?Intake/Output Summary (Last 24 hours) at 11/07/2021 0754 ?Last data filed at 11/07/2021 [id number] ?Gross per 24 hour  ?Intake 2867.81 ml  ?Output --  ?Net 2867.81 ml  ? ?Filed Weights  ? 11/01/21 1815  ?Weight: 82.1 kg  ? ? ? ?Data Reviewed:  ? ?CBC: ?Recent Labs  ?Lab  10/31/21 ?1642 11/01/21 ?0108 11/01/21 ?0415 11/05/21 ?[id number] 11/07/21 ?0442  ?WBC 7.6 7.3 7.0 7.1 6.6  ?NEUTROABS 5.5  --   --  4.9  --   ?HGB 10.1* 8.5* 8.6* 9.0* 9.1*  ?HCT 33.6* 28.5* 29.1* 30.0* 29.5*  ?MCV 88.0 88.8 88.4 87.7 87.3  ?PLT 377 323 304 326 312  ? ?Basic Metabolic Panel: ?Recent Labs  ?Lab 10/31/21 ?1642 11/01/21 ?0108 11/01/21 ?0415 11/02/21 ?[id number] 11/04/21 ?0409 11/05/21 ?[id number]  11/06/21 ?[id number] 11/07/21 ?0442  ?NA 138  --  136  --   --  137 139 137  ?K 4.1  --  3.7  --   --  3.4* 3.6 4.5  ?CL 103  --  105  --   --  108 105 106  ?CO2 24  --  24  --   --  24 26 27   ?GLUCOSE 90  --  114*  --   --  88 98 104*  ?BUN 19  --  12  --   --  14 14 10   ?CREATININE 0.72   < > 0.52 0.66 0.58 0.75 0.63 0.61  ?CALCIUM 9.3  --  8.6*  --   --  8.4* 8.6* 8.6*  ?MG  --   --   --   --   --  1.8 1.8 1.9  ? < > = values in this interval not displayed.  ? ?GFR: ?Estimated Creatinine Clearance: 93.9 mL/min (by C-G formula based on SCr of 0.61 mg/dL). ?Liver Function Tests: ?Recent Labs  ?Lab 10/31/21 ?1642 11/01/21 ?0415 11/05/21 ?[id number] 11/06/21 ?[id number] 11/07/21 ?0442  ?AST 19 15 17 18  12*  ?ALT 13 12 19 22 16   ?ALKPHOS 70 58 53 59 59  ?BILITOT 0.5 0.7 0.5 0.6 0.6  ?PROT 8.7* 7.5 6.8 6.6 6.6  ?ALBUMIN 3.7 3.3* 2.9* 2.8* 2.9*  ? ?No results for input(s): LIPASE, AMYLASE in the last 168 hours. ?No results for input(s): AMMONIA in the last 168 hours. ?Coagulation Profile: ?Recent Labs  ?Lab 11/02/21 ?0540  ?INR 1.2  ? ?Cardiac Enzymes: ?Recent Labs  ?Lab 11/01/21 ?0108  ?CKTOTAL 15*  ? ?BNP (last 3 results) ?No results for input(s): PROBNP in the last 8760 hours. ?HbA1C: ?No results for input(s): HGBA1C in the last 72 hours. ?CBG: ?No results for input(s): GLUCAP in the last 168 hours. ?Lipid Profile: ?No results for input(s): CHOL, HDL, LDLCALC, TRIG, CHOLHDL, LDLDIRECT in the last 72 hours. ?Thyroid Function Tests: ?No results for input(s): TSH, T4TOTAL, FREET4, T3FREE, THYROIDAB in the last 72 hours. ?Anemia Panel: ?No results for input(s): VITAMINB12, FOLATE, FERRITIN, TIBC, IRON, RETICCTPCT in the last 72 hours. ?Sepsis Labs: ?Recent Labs  ?Lab 11/01/21 ?0108 11/01/21 ?0415  ?LATICACIDVEN 0.9 0.5  ? ? ?Recent Results (from the past 240 hour(s))  ?Culture, blood (routine x 2)     Status: Abnormal (Preliminary result)  ? Collection Time: 10/31/21 11:23 PM  ? Specimen: BLOOD  ?Result Value Ref Range Status  ? Specimen  Description   Final  ?  BLOOD RIGHT FOREARM ?Performed at [facility], [address] [place], [place] [address] ?  ? Special Requests   Final  ?  BOTTLES DRAWN AEROBIC AND ANAEROBIC Blood Culture results may not be optimal due to an excessive volume of blood received in culture bottles ?Performed at [facility], [address] [place], [place] [address] ?  ? Culture  Setup Time   Final  ?  GRAM POSITIVE COCCI ?IN BOTH AEROBIC AND ANAEROBIC  BOTTLES ?CRITICAL RESULT CALLED TO, READ BACK BY AND VERIFIED WITH: NATHAN BELUTH @ 0008 ON 11/02/21.Marland KitchenMarland KitchenTKR ?GRAM STAIN REVIEWED-AGREE WITH RESULT ?  ? Culture (A)  Final  ?  STAPHYLOCOCCUS AUREUS ?Sent to [place] for further susceptibility testing. ?Performed at [facility], [place] [address], [place], [place] [address] ?  ? Report Status PENDING  Incomplete  ?Culture, blood (routine x 2)     Status: Abnormal (Preliminary result)  ? Collection Time: 10/31/21 11:23 PM  ? Specimen: BLOOD  ?Result Value Ref Range Status  ? Specimen Description   Final  ?  BLOOD LEFT ASSIST CONTROL ?Performed at [facility], [address], [place], [place] [address] ?  ? Special Requests   Final  ?  BOTTLES DRAWN AEROBIC AND ANAEROBIC Blood Culture results may not be optimal due to an excessive volume of blood received in culture bottles ?Performed at [facility], [address], [place], [place] [address] ?  ? Culture  Setup Time   Final  ?  GRAM POSITIVE COCCI ?AEROBIC BOTTLE ONLY ?CRITICAL VALUE NOTED.  VALUE IS CONSISTENT WITH PREVIOUSLY REPORTED AND CALLED VALUE. ?GRAM STAIN REVIEWED-AGREE WITH RESULT ?Performed at [facility], [place] [address] [place], [place] [address] ?  ? Culture STAPHYLOCOCCUS AUREUS (A)  Final  ? Report Status PENDING  Incomplete  ?Blood Culture ID Panel (Reflexed)     Status: Abnormal  ? Collection Time: 10/31/21 11:23 PM  ?Result Value Ref Range Status  ? Enterococcus faecalis NOT DETECTED NOT DETECTED Final  ?  Enterococcus Faecium NOT DETECTED NOT DETECTED Final  ? Listeria monocytogenes NOT DETECTED NOT DETECTED Final  ? Staphylococcus species DETECTED (A) NOT DETECTED Final  ?  Comment: CRITICAL RESULT CALLED TO,

## 2021-11-07 NOTE — Progress Notes (Signed)
? ?Date of Admission:  10/31/2021    ? ? ?ID: Jill Cox is a 46 y.o. female    ?Principal Problem: ?  Back pain ?Active Problems: ?  MRSA bacteremia ?  Polyarthritis ?  Depression ?  Anemia ?  OM (osteomyelitis) ([place]) ?  Discitis ? ? ? ?Jill Cox is a 46 y.o. female with recent Disseminated MRSA infection on IV daptomycoin presents with worsening lower back pain of 5 days duration ?[facility] for MRSA bacteremia with multiple septic arthritis and pyomyositis and was transferred to [facility] 3/14-3/24 for orthopedic surgery for disseminated MRSA infection ?Had persistent bacteremia from 3/11, 3/12, 3/13 and 3/15 ?3/14 TEE- No veg ?3/16,3/17, 3/19 NG ?  ?3/18  forearm I/D and 4th dorsal compartment extensor tenosynovectomy  culture positive for MRSA ?3/20 aspiration  rt knee- No mrsa ?3/20 Aspiration RT ankle - MRSA ?3/20 Rt calf- MRSA ?3/20 Aspiration left left Knee -No MRSA ?3/20 Left thigh abscess- -I/D MRSA ?  ?She also had extensive vertebral involvement ?Rt facet  joint involvement  at T2-T3 /T3-T4  ?Cervical spine- dorsal epidural collection of 5 mm extending C1-C7 with moderate stenosis at C4-5, C5-6, and C6-7 level ?  ?Lumbar MRI done without contrast on 09/28/21 collection at the left aspect of the thecal sac at L5, measuring approximately 10 x 11 x 23 ?Mm. L4-L5 disc involvement was questioned ?  ?  ?She was initially treated with dual MRSA antibiotic with ceftaroline + Dapto and then discharged on dapto to complete 6 weeks until 11/13/21 ?After she went home she was doing fine for a week and then started doing stretching and started noticing pain left lumbar area. ?Also the rt calf I/D site had some foul smell with discharge after the sutures were removed. ?She also feels the left thigh site which has healed has pain and fullness. ?She saw ID physician yesterday at [place] ?And his plan was to continue IV as planned  ? ? ?Subjective: ?Patient had TEE today.  Negative for  endocarditis ?Feeling better ?Family at bedside ?Pain in the back and the legs better ?Walking with PT ? ? ? ?Medications:  ? midazolam      ? butamben-tetracaine-benzocaine      ? [MAR Hold] Chlorhexidine Gluconate Cloth  6 each Topical Daily  ? [MAR Hold] docusate sodium  100 mg Oral BID  ? [MAR Hold] feeding supplement  237 mL Oral BID BM  ? fentaNYL      ? [MAR Hold] ferrous sulfate  325 mg Oral Q breakfast  ? [MAR Hold] heparin injection (subcutaneous)  5,000 Units Subcutaneous Q8H  ? lidocaine      ? midazolam      ? [MAR Hold] nicotine  7 mg Transdermal Daily  ? [MAR Hold] ondansetron (ZOFRAN) IV  4 mg Intravenous Q8H  ? [MAR Hold] sertraline  25 mg Oral Daily  ? sodium chloride flush      ? ? ?Objective: ?Vital signs in last 24 hours: ?Temp:  [98 ?F (36.7 ?C)-99.1 ?F (37.3 ?C)] 98.7 ?F (37.1 ?C) (04/25 1250) ?Pulse Rate:  [61-73] 61 (04/25 1321) ?Resp:  [12-18] 12 (04/25 1321) ?BP: (117-152)/(62-93) 117/73 (04/25 1321) ?SpO2:  [97 %-100 %] 98 % (04/25 1321) ? ?PHYSICAL EXAM:  ?General: Alert, cooperative, no distress, Lungs: b/la air entry ?Heart: Regular rate and rhythm, no murmur, rub or gallop. ?Abdomen: Soft, non-tender,not distended. Bowel sounds normal. No masses ?Extremities:rt calf- I/D site wound vac ?Left thigh I/D site  wound vac ? ?Lymph: Cervical, supraclavicular normal. ?Neurologic: Grossly non-focal ? ?Lab Results ?Recent Labs  ?  11/05/21 ?[id number] 11/06/21 ?[id number] 11/07/21 ?0442  ?WBC 7.1  --  6.6  ?HGB 9.0*  --  9.1*  ?HCT 30.0*  --  29.5*  ?NA 137 139 137  ?K 3.4* 3.6 4.5  ?CL 108 105 106  ?CO2 24 26 27   ?BUN 14 14 10   ?CREATININE 0.75 0.63 0.61  ? ?Liver Panel ?Recent Labs  ?  11/06/21 ?0614 11/07/21 ?0442  ?PROT 6.6 6.6  ?ALBUMIN 2.8* 2.9*  ?AST 18 12*  ?ALT 22 16  ?ALKPHOS 59 59  ?BILITOT 0.6 0.6  ? ? 3/11, 3/12, 3/13 and 3/15 BC- MRSA ?3/16,3/17, 3/19  BC NG ?Microbiology: ?[facility] 4/18 MRSA from PICC site ?Rt calf wound culture Proteus ?Left thigh wound culture negative ?Culture from the L5-S1  area after washout this MRSA ? ?Studies/Results: ?ECHOCARDIOGRAM COMPLETE ? ?Result Date: 11/06/2021 ?   ECHOCARDIOGRAM REPORT   Patient Name:   Jill Cox Date of Exam: 11/05/2021 Medical Rec #:  [id number]   Height:       66.0 in Accession #:    [id number]  Weight:       181.0 lb Date of Birth:  07-Jan-1976   BSA:          1.917 m? Patient Age:    30 years    BP:           124/65 mmHg Patient Gender: F           HR:           57 bpm. Exam Location:  [place] Procedure: 2D Echo, Cardiac Doppler and Color Doppler Indications:     Bacteremia R78.81  History:         Patient has prior history of Echocardiogram examinations, most                  recent 09/24/2021. Signs/Symptoms:Severe sepsis; Risk                  Factors:Tobacco use.  Sonographer:     Luane School RDCS Referring Phys:  [id number] Barb Merino Diagnosing Phys: Kathlyn Sacramento MD IMPRESSIONS  1. Left ventricular ejection fraction, by estimation, is 55 to 60%. The left ventricle has normal function. The left ventricle has no regional wall motion abnormalities. There is mild left ventricular hypertrophy. Left ventricular diastolic parameters were normal.  2. Right ventricular systolic function is normal. The right ventricular size is normal. There is normal pulmonary artery systolic pressure.  3. Left atrial size was mildly dilated.  4. The mitral valve is normal in structure. Trivial mitral valve regurgitation. No evidence of mitral stenosis.  5. The aortic valve is normal in structure. Aortic valve regurgitation is trivial. No aortic stenosis is present.  6. The inferior vena cava is normal in size with greater than 50% respiratory variability, suggesting right atrial pressure of 3 mmHg. Conclusion(s)/Recommendation(s): No evidence of valvular vegetations on this transthoracic echocardiogram. FINDINGS  Left Ventricle: Left ventricular ejection fraction, by estimation, is 55 to 60%. The left ventricle has normal function. The left ventricle has no regional wall  motion abnormalities. The left ventricular internal cavity size was normal in size. There is  mild left ventricular hypertrophy. Left ventricular diastolic parameters were normal. Right Ventricle: The right ventricular size is normal. No increase in right ventricular wall thickness. Right ventricular systolic function is normal. There is normal pulmonary artery systolic pressure. The tricuspid regurgitant  velocity is 2.25 m/s, and  with an assumed right atrial pressure of 3 mmHg, the estimated right ventricular systolic pressure is 123XX123 mmHg. Left Atrium: Left atrial size was mildly dilated. Right Atrium: Right atrial size was normal in size. Pericardium: There is no evidence of pericardial effusion. Mitral Valve: The mitral valve is normal in structure. Trivial mitral valve regurgitation. No evidence of mitral valve stenosis. Tricuspid Valve: The tricuspid valve is normal in structure. Tricuspid valve regurgitation is trivial. No evidence of tricuspid stenosis. Aortic Valve: The aortic valve is normal in structure. Aortic valve regurgitation is trivial. No aortic stenosis is present. Pulmonic Valve: The pulmonic valve was normal in structure. Pulmonic valve regurgitation is trivial. No evidence of pulmonic stenosis. Aorta: The aortic root is normal in size and structure. Venous: The inferior vena cava is normal in size with greater than 50% respiratory variability, suggesting right atrial pressure of 3 mmHg. IAS/Shunts: No atrial level shunt detected by color flow Doppler.  LEFT VENTRICLE PLAX 2D LVIDd:         4.70 cm   Diastology LVIDs:         3.30 cm   LV e' medial:    9.46 cm/s LV PW:         1.20 cm   LV E/e' medial:  12.6 LV IVS:        1.20 cm   LV e' lateral:   13.90 cm/s LVOT diam:     2.10 cm   LV E/e' lateral: 8.6 LV SV:         84 LV SV Index:   44 LVOT Area:     3.46 cm?  RIGHT VENTRICLE             IVC RV S prime:     13.80 cm/s  IVC diam: 1.60 cm TAPSE (M-mode): 2.7 cm LEFT ATRIUM             Index         RIGHT ATRIUM           Index LA diam:        4.10 cm 2.14 cm/m?   RA Area:     21.00 cm? LA Vol (A2C):   57.6 ml 30.05 ml/m?  RA Volume:   61.30 ml  31.98 ml/m? LA Vol (A4C):   95.2 ml 49.67 ml/m? LA Bipla

## 2021-11-07 NOTE — Progress Notes (Signed)
?   11/07/21 1825  ?What Happened  ?Was fall witnessed? Yes  ?Who witnessed fall? no one  ?Patients activity before fall ambulating-unassisted  ?Point of contact head;hip/leg;arm/shoulder  ?Was patient injured? Unsure  ?Follow Up  ?MD notified Gerlean Ren  ?Time MD notified [phone]  ?Family notified Yes - comment  ?Time family notified 1830  ?Additional tests Yes-comment ?(CT)  ?Simple treatment Other (comment)  ?Progress note created (see row info) Yes  ?Adult Fall Risk Assessment  ?Risk Factor Category (scoring not indicated) Not Applicable  ?Age 46  ?Fall History: Fall within 6 months prior to admission 5  ?Elimination; Bowel and/or Urine Incontinence 0  ?Elimination; Bowel and/or Urine Urgency/Frequency 0  ?Medications: includes PCA/Opiates, Anti-convulsants, Anti-hypertensives, Diuretics, Hypnotics, Laxatives, Sedatives, and Psychotropics 5  ?Patient Care Equipment 2  ?Mobility-Assistance 2 ?(refusing assistance)  ?Mobility-Gait 0  ?Mobility-Sensory Deficit 0  ?Altered awareness of immediate physical environment 0  ?Impulsiveness 0  ?Lack of understanding of one's physical/cognitive limitations 0  ?Total Score 14  ?Patient Fall Risk Level High fall risk  ?Adult Fall Risk Interventions  ?Required Bundle Interventions *See Row Information* High fall risk - low, moderate, and high requirements implemented  ?Additional Interventions Other (Comment) ?(Pt refusing bed alarm, calling out, and assistance)  ?Screening for Fall Injury Risk (To be completed on HIGH fall risk patients) - Assessing Need for Floor Mats  ?Risk For Fall Injury- Criteria for Floor Mats Noncompliant with safety precautions  ?Will Implement Floor Mats Yes  ?Vitals  ?Temp (!) 97.5 ?F (36.4 ?C)  ?Temp Source Oral  ?BP 102/65  ?MAP (mmHg) 75  ?BP Location Left Arm  ?BP Method Automatic  ?Patient Position (if appropriate) Lying  ?Pulse Rate 83  ?Pulse Rate Source Monitor  ?Resp 18  ?Oxygen Therapy  ?SpO2 100 %  ?O2 Device Room Air  ?Pain Assessment  ?Pain  Scale 0-10  ?Pain Score 9  ?Pain Type Chronic pain;Acute pain  ?Pain Location Back  ?Pain Intervention(s) MD notified (Comment)  ?Neurological  ?Neuro (WDL) WDL  ?Glasgow Coma Scale  ?Eye Opening 4  ?Best Verbal Response (NON-intubated) 5  ?Best Motor Response 6  ?Glasgow Coma Scale Score 15  ?Musculoskeletal  ?Musculoskeletal (WDL) X  ?Assistive Device BSC  ?Generalized Weakness Yes  ?Integumentary  ?Integumentary (WDL) X  ?Skin Color Appropriate for ethnicity  ?Skin Condition Dry  ?Skin Integrity Other (Comment)  ?Skin Turgor Non-tenting  ? ?

## 2021-11-08 ENCOUNTER — Encounter: Payer: Self-pay | Admitting: Cardiovascular Disease

## 2021-11-08 ENCOUNTER — Inpatient Hospital Stay: Payer: Self-pay

## 2021-11-08 DIAGNOSIS — M4647 Discitis, unspecified, lumbosacral region: Secondary | ICD-10-CM | POA: Diagnosis not present

## 2021-11-08 DIAGNOSIS — F32A Depression, unspecified: Secondary | ICD-10-CM | POA: Diagnosis not present

## 2021-11-08 DIAGNOSIS — D649 Anemia, unspecified: Secondary | ICD-10-CM | POA: Diagnosis not present

## 2021-11-08 DIAGNOSIS — M869 Osteomyelitis, unspecified: Secondary | ICD-10-CM

## 2021-11-08 DIAGNOSIS — M130 Polyarthritis, unspecified: Secondary | ICD-10-CM

## 2021-11-08 DIAGNOSIS — M549 Dorsalgia, unspecified: Secondary | ICD-10-CM

## 2021-11-08 LAB — AEROBIC/ANAEROBIC CULTURE W GRAM STAIN (SURGICAL/DEEP WOUND)
Gram Stain: NONE SEEN
Gram Stain: NONE SEEN

## 2021-11-08 LAB — COMPREHENSIVE METABOLIC PANEL
ALT: 15 U/L (ref 0–44)
AST: 14 U/L — ABNORMAL LOW (ref 15–41)
Albumin: 3 g/dL — ABNORMAL LOW (ref 3.5–5.0)
Alkaline Phosphatase: 59 U/L (ref 38–126)
Anion gap: 9 (ref 5–15)
BUN: 10 mg/dL (ref 6–20)
CO2: 29 mmol/L (ref 22–32)
Calcium: 9 mg/dL (ref 8.9–10.3)
Chloride: 101 mmol/L (ref 98–111)
Creatinine, Ser: 0.8 mg/dL (ref 0.44–1.00)
GFR, Estimated: >60 mL/min (ref >60)
Glucose, Bld: 106 mg/dL — ABNORMAL HIGH (ref 70–99)
Potassium: 3.8 mmol/L (ref 3.5–5.1)
Sodium: 139 mmol/L (ref 135–145)
Total Bilirubin: 0.9 mg/dL (ref 0.3–1.2)
Total Protein: 6.8 g/dL (ref 6.5–8.1)

## 2021-11-08 LAB — CBC
HCT: 31.2 % — ABNORMAL LOW (ref 36.0–46.0)
Hemoglobin: 9.5 g/dL — ABNORMAL LOW (ref 12.0–15.0)
MCH: 26.2 pg (ref 26.0–34.0)
MCHC: 30.4 g/dL (ref 30.0–36.0)
MCV: 86 fL (ref 80.0–100.0)
Platelets: 349 10*3/uL (ref 150–400)
RBC: 3.63 MIL/uL — ABNORMAL LOW (ref 3.87–5.11)
RDW: 15.1 % (ref 11.5–15.5)
WBC: 7.5 10*3/uL (ref 4.0–10.5)
nRBC: 0 % (ref 0.0–0.2)

## 2021-11-08 LAB — MAGNESIUM: Magnesium: 2.1 mg/dL (ref 1.7–2.4)

## 2021-11-08 LAB — SURGICAL PATHOLOGY

## 2021-11-08 MED ORDER — SODIUM CHLORIDE 0.9% FLUSH
10.0000 mL | Freq: Two times a day (BID) | INTRAVENOUS | Status: DC
  Administered 2021-11-08 – 2021-11-10 (×4): 10 mL
  Administered 2021-11-10: 20 mL
  Administered 2021-11-11 – 2021-11-12 (×3): 10 mL

## 2021-11-08 MED ORDER — SODIUM CHLORIDE 0.9% FLUSH: 10.0000 mL | INTRAVENOUS | Status: DC | PRN

## 2021-11-08 NOTE — TOC Progression Note (Signed)
Transition of Care (TOC) - Progression Note  ? ? ?Patient Details  ?Name: Analisse Gethers ?MRN: [id number] ?Date of Birth: 06-20-1976 ? ?Transition of Care (TOC) CM/SW Contact  ?Kennetha Pearman A Williamson Cavanah, LCSW ?Phone Number: ?11/08/2021, 3:08 PM ? ?Clinical Narrative:   [facility] can not do wound vacs. [facility] will service pt for hh needs. [facility] will also service pt at home regarding iv antibiotics-- confirmed with Pam. Pt's wound vac delivered to bedside today. Pt will have to get a PICC prior to dc. ? ? ? ?  ?  ? ?Expected Discharge Plan and Services ?  ?  ?  ?  ?  ?                ?  ?  ?  ?  ?  ?  ?  ?  ?  ?  ? ? ?Social Determinants of Health (SDOH) Interventions ?  ? ?Readmission Risk Interventions ?   ? View : No data to display.  ?  ?  ?  ? ? ?

## 2021-11-08 NOTE — Progress Notes (Signed)
PT Cancellation Note ? ?Patient Details ?Name: Jill Cox ?MRN: [id number] ?DOB: Jul 17, 1975 ? ? ?Cancelled Treatment:    Reason Eval/Treat Not Completed: Patient declined, no reason specified Spoke with MD and PA about patient's bestrest orders due to fall from previous night. Cleared for movement with PT. Upon entry patient wanted to eat breakfast first before participating in PT. Requesting PT come back in a little bit. Will re-attempt at a later time/date as available and patient medically appropriate for PT. Thank you! ? ? ?Angelica Ran, PT  ?11/08/21. 9:17 AM ? ?

## 2021-11-08 NOTE — Progress Notes (Signed)
Peripherally Inserted Central Catheter Placement ? ?The IV Nurse has discussed with the patient and/or persons authorized to consent for the patient, the purpose of this procedure and the potential benefits and risks involved with this procedure.  The benefits include less needle sticks, lab draws from the catheter, and the patient may be discharged home with the catheter. Risks include, but not limited to, infection, bleeding, blood clot (thrombus formation), and puncture of an artery; nerve damage and irregular heartbeat and possibility to perform a PICC exchange if needed/ordered by physician.  Alternatives to this procedure were also discussed.  Bard Power PICC patient education guide, fact sheet on infection prevention and patient information card has been provided to patient /or left at bedside.   ? ?PICC Placement Documentation  ?PICC Single Lumen [id number] Right Basilic 39 cm 0 cm (Active)  ?Indication for Insertion or Continuance of Line Home intravenous therapies (PICC only) 11/08/21 1733  ?Exposed Catheter (cm) 0 cm 11/08/21 1733  ?Site Assessment Clean, Dry, Intact 11/08/21 1733  ?Line Status Flushed;Saline locked;Blood return noted 11/08/21 1733  ?Dressing Type Securing device;Transparent 11/08/21 1733  ?Dressing Status Antimicrobial disc in place 11/08/21 1733  ?Safety Lock Not Applicable [id number]  ?Line Care Connections checked and tightened 11/08/21 1733  ?Dressing Intervention New dressing 11/08/21 1733  ?Dressing Change Due 11/15/21 11/08/21 1733  ? ? ? ? ? ?Mickel Baas  Monicka Cyran ?11/08/2021, 5:35 PM ? ?

## 2021-11-08 NOTE — Progress Notes (Signed)
? ? ?MEDICINE DAILY PROGRESS NOTE ? ?Patient: Jill Cox 46 y.o. female ?MRN: [id number] ? ?Today is hospital day 7 after presenting to ED on 10/31/2021  5:46 PM with back pain. 46 year old with recent extensive hospitalization for MRSA bacteremia, multiple septic joints, epidural abscess treated conservatively and on IV daptomycin at home through 5/1 with a PICC line.  Followed up at ID clinic and deemed stable to complete antibiotics but presented back to the ER after going home from clinic with worsening back pain. ? ? 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 Admitted due to worsening back pain and suspected not relieving epidural abscess. Patient grew MRSA again.   ?underwent incision and drainage by orthopedic on 4/20 for bilateral lower extremity abscess.  ?underwent I&D of L5-S1 on 4/21 by neurosurgery ?Echocardiogram was negative, TEE showed no endocarditis  ? ?Procedures and Significant Results:  ?11/02/21 I&D bilateral LE, wound vac in place  ?11/03/21 I&D L5-S1, lumbar drain removed 11/06/21 ?TTE and TEE  ?PICC removed, will need to be replaced prior to discharge  ? ?Consultants:  ?Infectious disease ?Orthopedics  ?Neurosurgery  ? ? ? ?SUBJECTIVE:  ?Pt seen and examined at bedside, resting comfortably, parents present in the room. She denies fever, she reports pain is controlled and she's eager to get up and walk a bit with PT today despite fall last night. She reports lightheadedness upon standing/sitting  ? ? ? ? ? ? ? ? ?ASSESSMENT & PLAN ? ?MRSA bacteremia of L5-S1 epidural abscess ?Left thigh and right calf abscess ?Pilonidal sinus ?- Initially cultures were positive on 3/12 until 3/15 thereafter became negative and discharged home on daptomycin.   ?-- Readmitted on 4/18 due to positive bacteremia. Currently on IV vancomycin and ceftaroline followed by infectious  disease. ?-- Underwent incision and drainage by neurosurgery on 4/21 with drain placement. Her lumbar drain was removed by neurosurgery 4/24. ?- Status post incision and drainage by orthopedic of her bilateral lower extremity on 4/20.  Managed by orthopedic ?- Pain control ?-- TTE and TEE, no vegetations  ?- Blood cultures 4/22-NGTD ?-- PICC Removed.  Can be placed again closer to discharge ?  ?Iron deficiency anemia ?- Iron supplements and bowel regimen ?  ?Depression ?- Zoloft ?  ?VTE Ppx: heparin sq ?CODE STATUS: FULL ?Admitted from: home ?Expected Dispo: home w/ home health PT ?Barriers to discharge: appreciate recs from specialists re: antibiotics and ID follow up on discharge, need to arrange home health  ?Family communication: parents at bedside ? ? ? ? ? ? ? ? ? ? ? ? ? ?Objective: ?Vital signs in last 24 hours: ?Temp:  [97.5 ?F (36.4 ?C)-99.6 ?F (37.6 ?C)] 98.4 ?F (36.9 ?C) (04/26 0434) ?Pulse Rate:  [60-83] 62 (04/26 0434) ?Resp:  [12-19] 16 (04/26 0434) ?BP: (102-152)/(56-93) 130/69 (04/26 0434) ?SpO2:  [94 %-100 %] 98 % (04/26 0434) ?Weight change:  ?Last BM Date : 10/31/21 ? ?Intake/Output from previous day: ?04/25 0701 - 04/26 0700 ?In: 1617.9 [P.O.:720; I.V.:108.1; IV Piggyback:789.9] ?Out: -  ?Intake/Output this shift: ?No intake/output data recorded. ? ?Constitutional:  ?VSS, see nurse notes ?General Appearance: alert, well-developed, well-nourished, NAD ?Eyes: ?Normal lids and conjunctive, non-icteric sclera ?Ears, Nose, Mouth, Throat: ?Normal appearance ?Neck: ?No masses, trachea midline ?Respiratory: ?Normal respiratory effort ?Breath sounds normal, no wheeze/rhonchi/rales ?Cardiovascular: ?S1/S2 normal, no murmur/rub/gallop auscultated ?No lower extremity edema ?Gastrointestinal: ?Nontender, no masses ?  Musculoskeletal:  ?No clubbing/cyanosis of digits ?Neurological: ?No cranial nerve deficit on limited exam ?Psychiatric: ?Normal judgment/insight ?Normal mood and affect ? ? ?Lab Results: ?Recent  Labs  ?  11/07/21 ?0442 11/08/21 ?0333  ?WBC 6.6 7.5  ?HGB 9.1* 9.5*  ?HCT 29.5* 31.2*  ?PLT 312 349  ? ?BMET ?Recent Labs  ?  11/07/21 ?0442 11/08/21 ?0333  ?NA 137 139  ?K 4.5 3.8  ?CL 106 101  ?CO2 27 29  ?GLUCOSE 104* 106*  ?BUN 10 10  ?CREATININE 0.61 0.80  ?CALCIUM 8.6* 9.0  ? ? ?Studies/Results: ?CT HEAD WO CONTRAST (5MM) ? ?Result Date: 11/07/2021 ?CLINICAL DATA:  Dizziness EXAM: CT HEAD WITHOUT CONTRAST TECHNIQUE: Contiguous axial images were obtained from the base of the skull through the vertex without intravenous contrast. RADIATION DOSE REDUCTION: This exam was performed according to the departmental dose-optimization program which includes automated exposure control, adjustment of the mA and/or kV according to patient size and/or use of iterative reconstruction technique. COMPARISON:  None. FINDINGS: Brain: No acute intracranial abnormality. Specifically, no hemorrhage, hydrocephalus, mass lesion, acute infarction, or significant intracranial injury. Vascular: No hyperdense vessel or unexpected calcification. Skull: No acute calvarial abnormality. Sinuses/Orbits: No acute findings Other: None IMPRESSION: No acute intracranial abnormality. Electronically Signed   By: Rolm Baptise M.D.   On: 11/07/2021 19:05   ? ?Medications: I have reviewed the patient's current medications. ?Scheduled: ? Chlorhexidine Gluconate Cloth  6 each Topical Daily  ? docusate sodium  100 mg Oral BID  ? feeding supplement  237 mL Oral BID BM  ? ferrous sulfate  325 mg Oral Q breakfast  ? heparin injection (subcutaneous)  5,000 Units Subcutaneous Q8H  ? nicotine  7 mg Transdermal Daily  ? ondansetron (ZOFRAN) IV  4 mg Intravenous Q8H  ? sertraline  25 mg Oral Daily  ? ?Continuous: ? ceFTAROline (TEFLARO) IV 600 mg (11/08/21 1159)  ? methocarbamol (ROBAXIN) IV    ? vancomycin 200 mL/hr at 11/08/21 0650  ? ?[id number], cyclobenzaprine, hydrALAZINE, ibuprofen, ipratropium-albuterol, methocarbamol **OR** methocarbamol (ROBAXIN)  IV, metoCLOPramide **OR** metoCLOPramide (REGLAN) injection, metoprolol tartrate, morphine injection, ondansetron **OR** ondansetron (ZOFRAN) IV, oxyCODONE, polyethylene glycol, senna-docusate, traZODone, zolpidem ?Anti-infectives (From admission, onward)  ? ? Start     Dose/Rate Route Frequency Ordered Stop  ? 11/08/21 0600  vancomycin (VANCOCIN) IVPB 1000 mg/200 mL premix       ? 1,000 mg ?200 mL/hr over 60 Minutes Intravenous Every 12 hours 11/07/21 1435    ? 11/04/21 2200  vancomycin (VANCOCIN) IVPB 1000 mg/200 mL premix  Status:  Discontinued       ? 1,000 mg ?200 mL/hr over 60 Minutes Intravenous Every 12 hours 11/04/21 1131 11/04/21 1131  ? 11/04/21 1230  vancomycin (VANCOREADY) IVPB 1500 mg/300 mL  Status:  Discontinued       ? 1,500 mg ?150 mL/hr over 120 Minutes Intravenous Every 12 hours 11/04/21 1131 11/04/21 1141  ? 11/04/21 1230  Vancomycin (VANCOCIN) 1,500 mg in sodium chloride 0.9 % 500 mL IVPB       ? 1,500 mg ?250 mL/hr over 120 Minutes Intravenous Every 12 hours 11/04/21 1141 11/07/21 1420  ? 11/03/21 1100  Vancomycin (VANCOCIN) 1,500 mg in sodium chloride 0.9 % 500 mL IVPB  Status:  Discontinued       ? 1,500 mg ?250 mL/hr over 120 Minutes Intravenous Every 12 hours 11/03/21 1006 11/04/21 1131  ? 11/03/21 0900  vancomycin (VANCOREADY) IVPB 1500 mg/300 mL  Status:  Discontinued       ?  1,500 mg ?150 mL/hr over 120 Minutes Intravenous Every 12 hours 11/03/21 0719 11/03/21 1004  ? 11/02/21 2200  ceftaroline (TEFLARO) 600 mg in sodium chloride 0.9 % 100 mL IVPB       ? 600 mg ?100 mL/hr over 60 Minutes Intravenous Every 8 hours 11/02/21 1652    ? 11/01/21 1000  vancomycin (VANCOREADY) IVPB 750 mg/150 mL  Status:  Discontinued       ? 750 mg ?150 mL/hr over 60 Minutes Intravenous Every 8 hours 11/01/21 0109 11/03/21 0719  ? 11/01/21 0300  ceFEPIme (MAXIPIME) 2 g in sodium chloride 0.9 % 100 mL IVPB  Status:  Discontinued       ? 2 g ?200 mL/hr over 30 Minutes Intravenous Every 8 hours 11/01/21 0101  11/01/21 1539  ? 11/01/21 0130  vancomycin (VANCOCIN) IVPB 1000 mg/200 mL premix       ? 1,000 mg ?200 mL/hr over 60 Minutes Intravenous  Once 11/01/21 0059 11/01/21 0333  ? 10/31/21 2300  vancomycin (V

## 2021-11-08 NOTE — Progress Notes (Signed)
Occupational Therapy Treatment ?Patient Details ?Name: Jill Cox ?MRN: [id number] ?DOB: 05-09-1976 ?Today's Date: 11/08/2021 ? ? ?History of present illness Pt admitted for back pain secondary to MRSA infection. Pt is POD 2 from L5-S1 evacuation of epidural abscess and POD 3 from I&D of L thigh and R calf with wound vacs. ?  ?OT comments ? Pt reports improvement in pain level today; however, states that she feels lightheaded and dizzy with any OOB movement. Following transfer to/from [facility], pt requests to complete grooming tasks from bed level, declining even sitting EOB due to dizziness. Pt able to complete UB grooming with Mod I following set up, requests clean pair of socks but is unable to reach feet, needs Max A for doffing/donning socks. Pt states she would like to attempt shower when lightheadedness resolves, hopefully tomorrow or next day. Pt's parents have arrived from out of state, confirm that they will be available for several weeks to assist pt at home post DC. Discussed possibility of modifying vs. completely cancelling travel plans post DC; finding meaningful activities, occupations to pursue during rehab period. Given pt's weakness, now exacerbated by dizziness, sitting and standing balance fair-poor.  ? ?Recommendations for follow up therapy are one component of a multi-disciplinary discharge planning process, led by the attending physician.  Recommendations may be updated based on patient status, additional functional criteria and insurance authorization. ?   ?Follow Up Recommendations ? No OT follow up  ?  ?Assistance Recommended at Discharge PRN  ?Patient can return home with the following ? A little help with walking and/or transfers;A little help with bathing/dressing/bathroom;Assistance with cooking/housework ?  ?Equipment Recommendations ? None recommended by OT  ?  ?Recommendations for Other Services   ? ?  ?Precautions / Restrictions Precautions ?Precautions: Fall ?Restrictions ?Weight Bearing  Restrictions: No  ? ? ?  ? ?Mobility Bed Mobility ?Overal bed mobility: Needs Assistance ?Bed Mobility: Supine to Sit, Sit to Supine ?  ?  ?Supine to sit: Supervision ?Sit to supine: Supervision ?  ?General bed mobility comments: dizziness with movement ?  ? ?Transfers ?Overall transfer level: Needs assistance ?Equipment used: Rolling walker (2 wheels) ?Transfers: Sit to/from Stand ?Sit to Stand: Min assist ?  ?  ?  ?  ?  ?General transfer comment: close SUPV for safety 2/2 dizziness ?  ?  ?Balance Overall balance assessment: Needs assistance ?Sitting-balance support: Bilateral upper extremity supported ?Sitting balance-Leahy Scale: Fair ?  ?  ?Standing balance support: Bilateral upper extremity supported, During functional activity, Reliant on assistive device for balance ?Standing balance-Leahy Scale: Fair ?  ?  ?  ?  ?  ?  ?  ?  ?  ?  ?  ?  ?   ? ?ADL either performed or assessed with clinical judgement  ? ?ADL Overall ADL's : Needs assistance/impaired ?  ?  ?Grooming: Wash/dry hands;Wash/dry face;Oral care;Brushing hair;Set up;Modified independent;Bed level ?Grooming Details (indicate cue type and reason): Mod I in grooming, but had to complete at bed level ?  ?  ?  ?  ?  ?  ?Lower Body Dressing: Maximal assistance ?Lower Body Dressing Details (indicate cue type and reason): Max A for doffing/donning socks, 2/2 back pain, unable to reach feet ?Toilet Transfer: BSC/3in1;Minimal assistance;Stand-pivot;Rolling walker (2 wheels) ?  ?Toileting- Clothing Manipulation and Hygiene: Supervision/safety;Sitting/lateral lean ?  ?  ?  ?Functional mobility during ADLs: Rolling walker (2 wheels) ?  ?  ? ?Extremity/Trunk Assessment Upper Extremity Assessment ?Upper Extremity Assessment: Overall WFL for tasks assessed ?  ?  Lower Extremity Assessment ?Lower Extremity Assessment: Generalized weakness ?  ?  ?  ? ?Vision   ?  ?  ?Perception   ?  ?Praxis   ?  ? ?Cognition Arousal/Alertness: Awake/alert ?Behavior During Therapy: [facility]  for tasks assessed/performed ?Overall Cognitive Status: Within Functional Limits for tasks assessed ?  ?  ?  ?  ?  ?  ?  ?  ?  ?  ?  ?  ?  ?  ?  ?  ?  ?  ?  ?   ?Exercises Other Exercises ?Other Exercises: Min Guard/Min A for transfers to.from BSC, 2/2 weakness, dizziness. Grooming at bed level. Educ re: DC recs, ECS. ? ?  ?Shoulder Instructions   ? ? ?  ?General Comments    ? ? ?Pertinent Vitals/ Pain       Pain Assessment ?Pain Assessment: 0-10 ?Pain Score: 5  ?Pain Location: low back ?Pain Descriptors / Indicators: Aching, Guarding, Discomfort, Grimacing ?Pain Intervention(s): Limited activity within patient's tolerance, Repositioned ? ?Home Living   ?  ?  ?  ?  ?  ?  ?  ?  ?  ?  ?  ?  ?  ?  ?  ?  ?  ?  ? ?  ?Prior Functioning/Environment    ?  ?  ?  ?   ? ?Frequency ? Min 2X/week  ? ? ? ? ?  ?Progress Toward Goals ? ?OT Goals(current goals can now be found in the care plan section) ? Progress towards OT goals: Progressing toward goals ? ?Acute Rehab OT Goals ?OT Goal Formulation: With patient ?Time For Goal Achievement: 11/20/21 ?Potential to Achieve Goals: Good  ?Plan Discharge plan remains appropriate;Frequency remains appropriate   ? ?Co-evaluation ? ? ?   ?  ?  ?  ?  ? ?  ?AM-PAC OT "6 Clicks" Daily Activity     ?Outcome Measure ? ? Help from another person eating meals?: None ?Help from another person taking care of personal grooming?: A Little ?Help from another person toileting, which includes using toliet, bedpan, or urinal?: A Lot ?Help from another person bathing (including washing, rinsing, drying)?: A Lot ?Help from another person to put on and taking off regular upper body clothing?: A Little ?Help from another person to put on and taking off regular lower body clothing?: A Lot ?6 Click Score: 16 ? ?  ?End of Session Equipment Utilized During Treatment: Rolling walker (2 wheels) ? ?OT Visit Diagnosis: Muscle weakness (generalized) (M62.81);Unsteadiness on feet (R26.81) ?  ?Activity Tolerance Patient  tolerated treatment well ?  ?Patient Left in bed;with call bell/phone within reach;with bed alarm set;with family/visitor present ?  ?Nurse Communication Mobility status ?  ? ?   ? ?Time: [id number] ?OT Time Calculation (min): 24 min ? ?Charges: OT General Charges ?$OT Visit: 1 Visit ?OT Treatments ?$Self Care/Home Management : 23-37 mins ?Josiah Lobo, PhD, MS, OTR/L ?11/08/21, 2:55 PM ? ?

## 2021-11-08 NOTE — TOC Progression Note (Signed)
Transition of Care (TOC) - Progression Note  ? ? ?Patient Details  ?Name: Jill Cox ?MRN: [id number] ?Date of Birth: [birth year] ? ?Transition of Care (TOC) CM/SW Contact  ?Amarise Lillo A Mainor Hellmann, LCSW ?Phone Number: ?11/08/2021, 10:12 AM ? ?Clinical Narrative: wound vac to be delivered today.   ? ? ? ?  ?  ? ?Expected Discharge Plan and Services ?  ?  ?  ?  ?  ?                ?  ?  ?  ?  ?  ?  ?  ?  ?  ?  ? ? ?Social Determinants of Health (SDOH) Interventions ?  ? ?Readmission Risk Interventions ?   ? View : No data to display.  ?  ?  ?  ? ? ?

## 2021-11-08 NOTE — Progress Notes (Signed)
Physical Therapy Treatment ?Patient Details ?Name: Jill Cox ?MRN: [id number] ?DOB: 1975/11/14 ?Today's Date: 11/08/2021 ? ? ?History of Present Illness Pt admitted for back pain secondary to MRSA infection. Pt is POD 2 from L5-S1 evacuation of epidural abscess and POD 3 from I&D of L thigh and R calf with wound vacs. ? ?  ?PT Comments  ? ? Patient cleared of bedrest orders by MD. Patient tolerated fairly well and was agreeable to treatment. Upon entering room patient reported no pain at rest, however pain increased to 5/10 with functional activity. Patient requested assistance to bathroom upon entry. Patient completed supine to sit at [facility] with increased time/effort and use of bed rails. Upon sitting, patient demonstrated a L lateral lean until low back adjusted to sitting position per patient. CGA was required for sit to stand, and patient [facility] to [facility] before increased lightheadedness and pain were reported. Patient reported she left her back give out, however nothing felt by author. Patient requested to return to supine and attempt again tomorrow.  Patient was left in supine with all needs met and in reach and family present. Patient would continue to benefit from skilled physical therapy in order to optimize patient's return to PLOF. Continue to recommend HHPT upon discharge from acute hospitalization.  ?  ?Recommendations for follow up therapy are one component of a multi-disciplinary discharge planning process, led by the attending physician.  Recommendations may be updated based on patient status, additional functional criteria and insurance authorization. ? ?Follow Up Recommendations ? Home health PT ?  ?  ?Assistance Recommended at Discharge Intermittent Supervision/Assistance  ?Patient can return home with the following A little help with walking and/or transfers;A little help with bathing/dressing/bathroom;Assist for transportation;Help  with stairs or ramp for entrance ?  ?Equipment Recommendations ? None recommended by PT  ?  ?Recommendations for Other Services   ? ? ?  ?Precautions / Restrictions Precautions ?Precautions: Fall ?Precaution Comments: Mod fall risk ?Restrictions ?Weight Bearing Restrictions: No  ?  ? ?Mobility ? Bed Mobility ?Overal bed mobility: Needs Assistance ?Bed Mobility: Supine to Sit, Sit to Supine ?  ?  ?Supine to sit: Supervision (Significant increased time and effort) ?Sit to supine: Min guard (increased time and effort) ?  ?General bed mobility comments: some grimacing with pain on exertion ?Patient Response: Anxious, Cooperative ? ?Transfers ?Overall transfer level: Needs assistance ?Equipment used: Rolling walker (2 wheels) ?Transfers: Sit to/from Stand ?Sit to Stand: Min guard ?  ?  ?  ?  ?  ?General transfer comment: cueing for forward lean while powering up into standing ?  ? ?Ambulation/Gait ?Ambulation/Gait assistance: Min guard ?Gait Distance (Feet): 10 Feet (5 forward/5backwards) ?Assistive device: Rolling walker (2 wheels) ?Gait Pattern/deviations: Step-to pattern, Antalgic ?Gait velocity: decreased ?  ?  ?General Gait Details: amblulated 5 feet forward/backwards, patient complained of back giving out and increased pain/lightheadness however no giving out felt by author, patient requested to sit back down ? ? ?Stairs ?  ?  ?  ?  ?  ? ? ?Wheelchair Mobility ?  ? ?Modified Rankin (Stroke Patients Only) ?  ? ? ?  ?Balance Overall balance assessment: Needs assistance ?Sitting-balance support: Bilateral upper extremity supported ?Sitting balance-Leahy Scale: Fair ?  ?  ?Standing balance support: Bilateral upper extremity supported, During functional activity, Reliant on assistive device for balance ?Standing balance-Leahy Scale: Fair ?  ?  ?  ?  ?  ?  ?  ?  ?  ?  ?  ?  ?  ? ?  ?  Cognition Arousal/Alertness: Awake/alert ?Behavior During Therapy: [facility] for tasks assessed/performed ?Overall Cognitive Status: Within  Functional Limits for tasks assessed ?  ?  ?  ?  ?  ?  ?  ?  ?  ?  ?  ?  ?  ?  ?  ?  ?General Comments: pleasant and agreeable to therapy ?  ?  ? ?  ?Exercises   ? ?  ?General Comments   ?  ?  ? ?Pertinent Vitals/Pain Pain Assessment ?Pain Assessment: 0-10 ?Pain Score: 5  ?Faces Pain Scale: Hurts little more ?Pain Location: low back ?Pain Descriptors / Indicators: Operative site guarding, Guarding, Discomfort ?Pain Intervention(s): Monitored during session, Limited activity within patient's tolerance, Repositioned  ? ? ?Home Living   ?  ?  ?  ?  ?  ?  ?  ?  ?  ?   ?  ?Prior Function    ?  ?  ?   ? ?PT Goals (current goals can now be found in the care plan section) Acute Rehab PT Goals ?Patient Stated Goal: to go home ?PT Goal Formulation: With patient ?Time For Goal Achievement: 11/19/21 ?Potential to Achieve Goals: Good ?Progress towards PT goals: Progressing toward goals ? ?  ?Frequency ? ? ? Min 2X/week ? ? ? ?  ?PT Plan Current plan remains appropriate  ? ? ?Co-evaluation   ?  ?  ?  ?  ? ?  ?AM-PAC PT "6 Clicks" Mobility   ?Outcome Measure ? Help needed turning from your back to your side while in a flat bed without using bedrails?: None ?Help needed moving from lying on your back to sitting on the side of a flat bed without using bedrails?: None ?Help needed moving to and from a bed to a chair (including a wheelchair)?: A Little ?Help needed standing up from a chair using your arms (e.g., wheelchair or bedside chair)?: A Little ?Help needed to walk in hospital room?: A Little ?Help needed climbing 3-5 steps with a railing? : A Little ?6 Click Score: 20 ? ?  ?End of Session Equipment Utilized During Treatment: Gait belt ?Activity Tolerance: Patient tolerated treatment well;Patient limited by pain;Other (comment) (limited by lightheadness) ?Patient left: in bed;with family/visitor present;with call bell/phone within reach ?Nurse Communication: Mobility status ?PT Visit Diagnosis: Muscle weakness (generalized)  (M62.81);Difficulty in walking, not elsewhere classified (R26.2);Pain ?Pain - Right/Left:  (bilaterally) ?  ? ? ?Time: 8241-7530 ?PT Time Calculation (min) (ACUTE ONLY): 19 min ? ?Charges:  $Therapeutic Activity: 8-22 mins          ?          ? ?Iva Boop, PT  ?11/08/21. 10:46 AM ? ? ?

## 2021-11-09 ENCOUNTER — Other Ambulatory Visit (HOSPITAL_COMMUNITY): Payer: Self-pay

## 2021-11-09 ENCOUNTER — Encounter: Payer: 59 | Admitting: Physical Therapy

## 2021-11-09 DIAGNOSIS — M4656 Other infective spondylopathies, lumbar region: Secondary | ICD-10-CM | POA: Diagnosis not present

## 2021-11-09 DIAGNOSIS — M4620 Osteomyelitis of vertebra, site unspecified: Secondary | ICD-10-CM | POA: Diagnosis not present

## 2021-11-09 DIAGNOSIS — M549 Dorsalgia, unspecified: Secondary | ICD-10-CM | POA: Diagnosis not present

## 2021-11-09 DIAGNOSIS — D649 Anemia, unspecified: Secondary | ICD-10-CM | POA: Diagnosis not present

## 2021-11-09 DIAGNOSIS — F32A Depression, unspecified: Secondary | ICD-10-CM | POA: Diagnosis not present

## 2021-11-09 DIAGNOSIS — R7881 Bacteremia: Secondary | ICD-10-CM | POA: Diagnosis not present

## 2021-11-09 DIAGNOSIS — M4647 Discitis, unspecified, lumbosacral region: Secondary | ICD-10-CM | POA: Diagnosis not present

## 2021-11-09 DIAGNOSIS — I951 Orthostatic hypotension: Secondary | ICD-10-CM

## 2021-11-09 DIAGNOSIS — B9562 Methicillin resistant Staphylococcus aureus infection as the cause of diseases classified elsewhere: Secondary | ICD-10-CM | POA: Diagnosis not present

## 2021-11-09 LAB — CBC
HCT: 32 % — ABNORMAL LOW (ref 36.0–46.0)
Hemoglobin: 9.7 g/dL — ABNORMAL LOW (ref 12.0–15.0)
MCH: 26 pg (ref 26.0–34.0)
MCHC: 30.3 g/dL (ref 30.0–36.0)
MCV: 85.8 fL (ref 80.0–100.0)
Platelets: 349 10*3/uL (ref 150–400)
RBC: 3.73 MIL/uL — ABNORMAL LOW (ref 3.87–5.11)
RDW: 14.9 % (ref 11.5–15.5)
WBC: 6.3 10*3/uL (ref 4.0–10.5)
nRBC: 0 % (ref 0.0–0.2)

## 2021-11-09 LAB — CULTURE, BLOOD (ROUTINE X 2)
Culture: NO GROWTH
Culture: NO GROWTH
Special Requests: ADEQUATE
Special Requests: ADEQUATE

## 2021-11-09 LAB — CK: Total CK: 20 U/L — ABNORMAL LOW (ref 38–234)

## 2021-11-09 LAB — COMPREHENSIVE METABOLIC PANEL
ALT: 13 U/L (ref 0–44)
AST: 13 U/L — ABNORMAL LOW (ref 15–41)
Albumin: 3.1 g/dL — ABNORMAL LOW (ref 3.5–5.0)
Alkaline Phosphatase: 60 U/L (ref 38–126)
Anion gap: 6 (ref 5–15)
BUN: 11 mg/dL (ref 6–20)
CO2: 28 mmol/L (ref 22–32)
Calcium: 8.8 mg/dL — ABNORMAL LOW (ref 8.9–10.3)
Chloride: 105 mmol/L (ref 98–111)
Creatinine, Ser: 0.66 mg/dL (ref 0.44–1.00)
GFR, Estimated: >60 mL/min (ref >60)
Glucose, Bld: 117 mg/dL — ABNORMAL HIGH (ref 70–99)
Potassium: 3.8 mmol/L (ref 3.5–5.1)
Sodium: 139 mmol/L (ref 135–145)
Total Bilirubin: 0.7 mg/dL (ref 0.3–1.2)
Total Protein: 7.2 g/dL (ref 6.5–8.1)

## 2021-11-09 LAB — SEDIMENTATION RATE: Sed Rate: 71 mm/hr — ABNORMAL HIGH (ref 0–20)

## 2021-11-09 LAB — C-REACTIVE PROTEIN: CRP: 7.9 mg/dL — ABNORMAL HIGH (ref <1.0)

## 2021-11-09 LAB — MAGNESIUM: Magnesium: 2 mg/dL (ref 1.7–2.4)

## 2021-11-09 MED ORDER — SULFAMETHOXAZOLE-TRIMETHOPRIM 800-160 MG PO TABS
2.0000 | ORAL_TABLET | Freq: Two times a day (BID) | ORAL | Status: DC
  Administered 2021-11-10 – 2021-11-12 (×5): 2 via ORAL
  Filled 2021-11-09 (×5): qty 2

## 2021-11-09 MED ORDER — SODIUM CHLORIDE 0.9 % IV SOLN
10.0000 mg/kg | Freq: Every day | INTRAVENOUS | Status: DC
  Administered 2021-11-10 – 2021-11-11 (×2): 800 mg via INTRAVENOUS
  Filled 2021-11-09 (×2): qty 16

## 2021-11-09 MED ORDER — METRONIDAZOLE 500 MG PO TABS
500.0000 mg | ORAL_TABLET | Freq: Two times a day (BID) | ORAL | Status: DC
  Administered 2021-11-09 – 2021-11-12 (×7): 500 mg via ORAL
  Filled 2021-11-09 (×7): qty 1

## 2021-11-09 NOTE — Progress Notes (Signed)
Orthostatic VS obtained this evening. Jill Cox became dizzy after sitting and standing for the completion of the set of vitals. Safely assisted back to bed.   ? ? ? 11/08/21 2033 11/08/21 2036 11/08/21 2040  ?Vitals  ?BP (!) 148/72 (!) 110/54 (!) 98/53  ?BP Location Left Arm  --   --   ?BP Method Automatic  --   --   ?Patient Position (if appropriate) Lying Sitting Standing  ?Pulse Rate 80 87 (!) 113  ? ? ?

## 2021-11-09 NOTE — Progress Notes (Signed)
? ? ?MEDICINE DAILY PROGRESS NOTE ? ?Patient: Jill Cox 46 y.o. female ?MRN: [id number] ? ?Today is hospital day 8 after presenting to ED on 10/31/2021  5:46 PM with back pain. 46 year old with recent extensive hospitalization for MRSA bacteremia, multiple septic joints, epidural abscess treated conservatively and on IV daptomycin at home through 5/1 with a PICC line.  Followed up at ID clinic and deemed stable to complete antibiotics but presented back to the ER after going home from clinic with worsening back pain. ? ? 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 Admitted due to worsening back pain and suspected not relieving epidural abscess. Patient grew MRSA again.   ?underwent incision and drainage by orthopedic on 4/20 for bilateral lower extremity abscess.  ?underwent I&D of L5-S1 on 4/21 by neurosurgery ?Echocardiogram was negative, TEE showed no endocarditis  ?ID following, was awaiting culture/MIC which was available 11/09/21, need to keep patient overnight to confirm home antibiotics can be available but hopefully can go tomorrow AM 11/10/21 pending any further developments.  ? ?Procedures and Significant Results:  ?11/02/21 I&D bilateral LE, wound vac in place  ?11/03/21 I&D L5-S1, lumbar drain removed 11/06/21 ?TTE and TEE  ?PICC removed, replaced 04/08/21/21 ? ?Consultants:  ?Infectious disease ?Orthopedics  ?Neurosurgery  ? ? ? ?SUBJECTIVE:  ?Pt seen and examined at bedside, resting comfortably, parents and husband present in the room. She denies fever, she reports pain is controlled. Reports still feeling a bit dizzy (positive orthostatics last night) but otherwise feeling stonger, walking w/ PT ? ? ? ? ? ? ? ? ?ASSESSMENT & PLAN ? ?Back pain ?OM (osteomyelitis) ([place]) ?Discitis ?Polyarthritis ?MRSA bacteremia ?Initially cultures were positive on 3/12 until 3/15  thereafter became negative and discharged home on daptomycin.   ?Readmitted on 4/18 due to positive bacteremia. Currently on IV vancomycin and ceftaroline followed by infectious disease. ?Underwent incision and drainage by neurosurgery on 4/21 with drain placement. Her lumbar drain was removed by neurosurgery 4/24. ?Status post incision and drainage by orthopedic of her bilateral lower extremity on 4/20.  Managed by orthopedic ?Pain control ?TTE and TEE, no vegetations  ?Blood cultures 4/22-NGTD ?PICC replaced ?TOC to conform tomorrow that [facility] in place for wound vac, antibiotics via PICC  ?Pt had questions about rehab/SNF, I advised if PT believes she would do well at home then we should try to avoid her spending time in a facility, from an infection standpoint  ? ?Orthostatic hypotension ?Most likely d/t acute illness, poor po intake  ?Encourage increased po hydration, can liberalize salt intake  ? ?Anemia ?Stable ? ?Depression ?controlled ? ?  ?VTE Ppx: heparin sq ?CODE STATUS: FULL ?Admitted from: home ?Expected Dispo: home w/ home health PT ?Barriers to discharge: appreciate recs from specialists re: antibiotics and ID follow up on discharge, need to arrange home health  ?Family communication: parents at bedside ? ? ? ? ? ? ? ? ? ? ? ? ? ?Objective: ?Vital signs in last 24 hours: ?Temp:  [97.7 ?F (36.5 ?C)-98.8 ?F (37.1 ?C)] 98.8 ?F (37.1 ?C) (04/27 1713) ?Pulse Rate:  [55-113] 69 (04/27 1713) ?Resp:  [16-18] 16 (04/27 1713) ?BP: (98-148)/(53-72) 113/59 (04/27 1713) ?SpO2:  [99 %-100 %] 99 % (04/27 1713) ?Weight change:  ?Last BM Date : 10/31/21 ? ?Intake/Output from previous day: ?04/26 0701 - 04/27 0700 ?In: 1115.9 [P.O.:360; IV Piggyback:755.9] ?Out: -  ?Intake/Output  this shift: ?Total I/O ?In: 108.9 [I.V.:10; IV Piggyback:98.9] ?Out: 1 [Urine:1] ? ?Constitutional:  ?VSS, see nurse notes ?General Appearance: alert, well-developed, well-nourished, NAD ?Eyes: ?Normal lids and conjunctive, non-icteric  sclera ?Ears, Nose, Mouth, Throat: ?Normal appearance ?Neck: ?No masses, trachea midline ?Respiratory: ?Normal respiratory effort ?Breath sounds normal, no wheeze/rhonchi/rales ?Cardiovascular: ?S1/S2 normal, no murmur/rub/gallop auscultated ?No lower extremity edema ?Gastrointestinal: ?Nontender, no masses ?Musculoskeletal:  ?No clubbing/cyanosis of digits ?Neurological: ?No cranial nerve deficit on limited exam ?Psychiatric: ?Normal judgment/insight ?Normal mood and affect ? ? ?Lab Results: ?Recent Labs  ?  11/08/21 ?0333 11/09/21 ?0400  ?WBC 7.5 6.3  ?HGB 9.5* 9.7*  ?HCT 31.2* 32.0*  ?PLT 349 349  ? ? ?BMET ?Recent Labs  ?  11/08/21 ?0333 11/09/21 ?0400  ?NA 139 139  ?K 3.8 3.8  ?CL 101 105  ?CO2 29 28  ?GLUCOSE 106* 117*  ?BUN 10 11  ?CREATININE 0.80 0.66  ?CALCIUM 9.0 8.8*  ? ? ? ?Studies/Results: ?CT HEAD WO CONTRAST (5MM) ? ?Result Date: 11/07/2021 ?CLINICAL DATA:  Dizziness EXAM: CT HEAD WITHOUT CONTRAST TECHNIQUE: Contiguous axial images were obtained from the base of the skull through the vertex without intravenous contrast. RADIATION DOSE REDUCTION: This exam was performed according to the departmental dose-optimization program which includes automated exposure control, adjustment of the mA and/or kV according to patient size and/or use of iterative reconstruction technique. COMPARISON:  None. FINDINGS: Brain: No acute intracranial abnormality. Specifically, no hemorrhage, hydrocephalus, mass lesion, acute infarction, or significant intracranial injury. Vascular: No hyperdense vessel or unexpected calcification. Skull: No acute calvarial abnormality. Sinuses/Orbits: No acute findings Other: None IMPRESSION: No acute intracranial abnormality. Electronically Signed   By: Rolm Baptise M.D.   On: 11/07/2021 19:05  ? ?[place] EKG SITE RITE ? ?Result Date: 11/08/2021 ?If [employer] not attached, placement could not be confirmed due to current cardiac rhythm.  ? ?Medications: I have reviewed the patient's current  medications. ?Scheduled: ? Chlorhexidine Gluconate Cloth  6 each Topical Daily  ? docusate sodium  100 mg Oral BID  ? feeding supplement  237 mL Oral BID BM  ? ferrous sulfate  325 mg Oral Q breakfast  ? heparin injection (subcutaneous)  5,000 Units Subcutaneous Q8H  ? metroNIDAZOLE  500 mg Oral BID  ? nicotine  7 mg Transdermal Daily  ? sertraline  25 mg Oral Daily  ? sodium chloride flush  10-40 mL Intracatheter Q12H  ? [START ON 11/10/2021] sulfamethoxazole-trimethoprim  2 tablet Oral Q12H  ? ?Continuous: ? ceFTAROline (TEFLARO) IV Stopped (11/09/21 1532)  ? [START ON 11/10/2021] DAPTOmycin (CUBICIN)  IV    ? methocarbamol (ROBAXIN) IV    ? vancomycin 200 mL/hr at 11/09/21 0618  ? ?[id number], cyclobenzaprine, hydrALAZINE, ibuprofen, ipratropium-albuterol, methocarbamol **OR** methocarbamol (ROBAXIN) IV, metoCLOPramide **OR** metoCLOPramide (REGLAN) injection, metoprolol tartrate, morphine injection, ondansetron **OR** ondansetron (ZOFRAN) IV, oxyCODONE, polyethylene glycol, senna-docusate, sodium chloride flush, traZODone, zolpidem ?Anti-infectives (From admission, onward)  ? ? Start     Dose/Rate Route Frequency Ordered Stop  ? 11/10/21 1000  sulfamethoxazole-trimethoprim (BACTRIM DS) 800-160 MG per tablet 2 tablet       ? 2 tablet Oral Every 12 hours 11/09/21 1634    ? 11/10/21 0800  DAPTOmycin (CUBICIN) 800 mg in sodium chloride 0.9 % IVPB       ? 10 mg/kg ? 82.1 kg ?132 mL/hr over 30 Minutes Intravenous Daily 11/09/21 1634    ? 11/09/21 1000  metroNIDAZOLE (FLAGYL) tablet 500 mg       ?  500 mg Oral 2 times daily 11/09/21 0904    ? 11/08/21 0600  vancomycin (VANCOCIN) IVPB 1000 mg/200 mL premix       ? 1,000 mg ?200 mL/hr over 60 Minutes Intravenous Every 12 hours 11/07/21 1435 11/09/21 2359  ? 11/04/21 2200  vancomycin (VANCOCIN) IVPB 1000 mg/200 mL premix  Status:  Discontinued       ? 1,000 mg ?200 mL/hr over 60 Minutes Intravenous Every 12 hours 11/04/21 1131 11/04/21 1131  ? 11/04/21 1230   vancomycin (VANCOREADY) IVPB 1500 mg/300 mL  Status:  Discontinued       ? 1,500 mg ?150 mL/hr over 120 Minutes Intravenous Every 12 hours 11/04/21 1131 11/04/21 1141  ? 11/04/21 1230  Vancomycin (VANCOCIN) 1,500 mg in sodium

## 2021-11-09 NOTE — Progress Notes (Signed)
Pre-rounds chart review and preliminary plan / goals for today: ?Significant overnight events per chart: none. Orthostatic VS (+) ?Significant new findings on labs/other diagnostics: cultures updated yesterday. PICC also inserted  ?Dispo plan: HH w/ PT/OT and RN  ?Pending/Plan:  ?Appreciate ID recs re: [email or web address]  ?TOC to confirm HH w/ wound vac, PICC care   ? ?Please feel free to reach out via secure chat in Epic for non-urgent issues  ?Please page for urgent matters! ?This note will be updated after rounds to full progress note / discharge note.  ? ?

## 2021-11-09 NOTE — TOC Benefit Eligibility Note (Signed)
Patient Advocate Encounter ? ?Insurance verification completed.   ? ?The patient is currently admitted and upon discharge could be taking linezolid (Zyvox) 600 mg tablets. ? ?The current 30 day co-pay is, $14.00.  ? ?The patient is insured through [place] ([place] is the only [employer] that takes T[employer])  ? ? ? ?Lyndel Safe, CPhT ?Pharmacy Patient Advocate Specialist ?[place] Patient Advocate Team ?Direct Number: [phone]  Fax: [phone] ? ? ? ? ? ?  ?

## 2021-11-09 NOTE — Assessment & Plan Note (Addendum)
Encourage increased po hydration, can liberalize salt intake  ?Main barrier to discharge at this point given substantial orthostatic vital signs and symptoms, concern for high readmit/fall risk if she were to go home.  Have asked PT to reassess.  Started midodrine today.  A.m. cortisol pending.  Patient inquires about SNF/rehab ?

## 2021-11-09 NOTE — Progress Notes (Signed)
Physical Therapy Treatment ?Patient Details ?Name: Jill Cox ?MRN: [id number] ?DOB: 03-02-1976 ?Today's Date: 11/09/2021 ? ? ?History of Present Illness Pt admitted for back pain secondary to MRSA infection. Pt is POD 2 from L5-S1 evacuation of epidural abscess and POD 3 from I&D of L thigh and R calf with wound vacs. ? ?  ?PT Comments  ? ? Physical Therapy Treatment completed this date. Patient appeared in much better spirits this session, and eager to participate. Patient continues to require SUP for all bed mobility, however completed it with increased ease today. Sit to stand from EOB from an elevated height  was completed at SBA, however from recliner patient required CGA and cueing on hand placement. No dizzy spells were reported throughout session, and pain was reported as a 3/10 throughout the session. Patient was able to ambulate >200 feet at [facility] however progressed to SBA with RW and no LOB noted. Standing rest breaks required due to LBP and fear of dizzy spells. Patient additionally ascending and descended 4 steps with B hand rails at [facility]. Increased time required, however patient completed them safety with no LOB noted. Patient would continue to benefit from skilled physical therapy in order to optimize patient's return to PLOF. Continue to recommend HHPT upon discharge from acute hospitalization. ?   ?Recommendations for follow up therapy are one component of a multi-disciplinary discharge planning process, led by the attending physician.  Recommendations may be updated based on patient status, additional functional criteria and insurance authorization. ? ?Follow Up Recommendations ? Home health PT ?  ?  ?Assistance Recommended at Discharge Intermittent Supervision/Assistance  ?Patient can return home with the following A little help with walking and/or transfers;A little help with bathing/dressing/bathroom;Assist for transportation;Help with stairs or ramp for entrance ?  ?Equipment Recommendations ? None  recommended by PT  ?  ?Recommendations for Other Services   ? ? ?  ?Precautions / Restrictions Precautions ?Precautions: Fall ?Precaution Comments: Mod fall risk ?Restrictions ?Weight Bearing Restrictions: No  ?  ? ?Mobility ? Bed Mobility ?Overal bed mobility: Needs Assistance ?Bed Mobility: Supine to Sit, Sit to Supine ?  ?  ?Supine to sit: Supervision ?Sit to supine: Supervision ?  ?General bed mobility comments: increased ease compared to yesterdays session, continues to require increased time and effort ?Patient Response: Cooperative ? ?Transfers ?Overall transfer level: Needs assistance ?Equipment used: Rolling walker (2 wheels) ?Transfers: Sit to/from Stand ?Sit to Stand: Min guard (SBA) ?  ?  ?  ?  ?  ?General transfer comment: SBA from elevated bed height, CGA from recliner ?  ? ?Ambulation/Gait ?Ambulation/Gait assistance: Min guard (SBA) ?Gait Distance (Feet): 236 Feet ?Assistive device: Rolling walker (2 wheels) ?Gait Pattern/deviations: Step-to pattern, Antalgic, Narrow base of support ?Gait velocity: decreased ?  ?  ?General Gait Details: Guarded posture during ambulation, cueing to relax shoulders and decreased WBing through BUE on RW. Patient required multiple standing rest breaks during ambulation due to mild discomfort from back, however no dizziness noted this session ? ? ?Stairs ?Stairs: Yes ?Stairs assistance: Min guard ?Stair Management: Two rails, Step to pattern ?Number of Stairs: 4 ?General stair comments: patient ascending and descended steps in a safe manner, no LOB noted ? ? ?Wheelchair Mobility ?  ? ?Modified Rankin (Stroke Patients Only) ?  ? ? ?  ?Balance Overall balance assessment: Needs assistance ?Sitting-balance support: Bilateral upper extremity supported ?Sitting balance-Leahy Scale: Fair ?  ?  ?Standing balance support: Bilateral upper extremity supported, During functional activity, Reliant on assistive  device for balance ?Standing balance-Leahy Scale: Fair ?Standing balance  comment: balance good but pt demonstrates weakness in standing ?  ?  ?  ?  ?  ?  ?  ?  ?  ?  ?  ?  ? ?  ?Cognition Arousal/Alertness: Awake/alert ?Behavior During Therapy: [facility] for tasks assessed/performed ?Overall Cognitive Status: Within Functional Limits for tasks assessed ?  ?  ?  ?  ?  ?  ?  ?  ?  ?  ?  ?  ?  ?  ?  ?  ?General Comments: pleasant and agreeable to therapy ?  ?  ? ?  ?Exercises   ? ?  ?General Comments   ?  ?  ? ?Pertinent Vitals/Pain Pain Assessment ?Pain Assessment: 0-10 ?Pain Score: 3  ?Faces Pain Scale: Hurts a little bit ?Pain Location: low back ?Pain Descriptors / Indicators: Aching, Guarding, Discomfort, Grimacing ?Pain Intervention(s): Monitored during session, Limited activity within patient's tolerance, Repositioned  ? ? ?Home Living   ?  ?  ?  ?  ?  ?  ?  ?  ?  ?   ?  ?Prior Function    ?  ?  ?   ? ?PT Goals (current goals can now be found in the care plan section) Acute Rehab PT Goals ?Patient Stated Goal: to go home ?PT Goal Formulation: With patient ?Time For Goal Achievement: 11/19/21 ?Potential to Achieve Goals: Good ?Progress towards PT goals: Progressing toward goals ? ?  ?Frequency ? ? ? Min 2X/week ? ? ? ?  ?PT Plan Current plan remains appropriate  ? ? ?Co-evaluation   ?  ?  ?  ?  ? ?  ?AM-PAC PT "6 Clicks" Mobility   ?Outcome Measure ? Help needed turning from your back to your side while in a flat bed without using bedrails?: None ?Help needed moving from lying on your back to sitting on the side of a flat bed without using bedrails?: None ?Help needed moving to and from a bed to a chair (including a wheelchair)?: A Little ?Help needed standing up from a chair using your arms (e.g., wheelchair or bedside chair)?: A Little ?Help needed to walk in hospital room?: A Little ?Help needed climbing 3-5 steps with a railing? : A Little ?6 Click Score: 20 ? ?  ?End of Session Equipment Utilized During Treatment: Gait belt ?Activity Tolerance: Patient tolerated treatment well;Patient  limited by pain;Other (comment) (limited by anxiety of dizzy spells) ?Patient left: in bed;with call bell/phone within reach;with bed alarm set;with family/visitor present ?Nurse Communication: Mobility status ?PT Visit Diagnosis: Muscle weakness (generalized) (M62.81);Difficulty in walking, not elsewhere classified (R26.2);Pain ?Pain - Right/Left:  (bilaterally) ?  ? ? ?Time: [phone] ?PT Time Calculation (min) (ACUTE ONLY): 32 min ? ?Charges:  $Gait Training: 8-22 mins ?$Therapeutic Activity: 8-22 mins          ?          ?Iva Boop, PT  ?11/09/21. 10:13 AM ? ? ?

## 2021-11-09 NOTE — Progress Notes (Signed)
? ?  Subjective: ?2 Days Post-Op  ?IRRIGATION AND DEBRIDEMENT WOUND LEFT THIGH, AND RIGHT CALF (Bilateral) - RIGHT LOWER LEG, LEFT UPPER LEG ?Patient reports pain as mild. Pain much improved in both legs. ?Patient is well with no complaints.  ?Denies any CP, SOB, ABD pain. ?We will continue therapy today.  ? ?Objective: ?Vital signs in last 24 hours: ?Temp:  [97.7 ?F (36.5 ?C)-98.6 ?F (37 ?C)] 98 ?F (36.7 ?C) (04/27 [id number]) ?Pulse Rate:  [55-113] 55 (04/27 0841) ?Resp:  [16-18] 16 (04/27 0841) ?BP: (98-148)/(53-72) 120/62 (04/27 0841) ?SpO2:  [95 %-100 %] 100 % (04/27 0841) ? ?Intake/Output from previous day: ?04/26 0701 - 04/27 0700 ?In: 1115.9 [P.O.:360; IV Piggyback:755.9] ?Out: -  ?Intake/Output this shift: ?Total I/O ?In: 10 [I.V.:10] ?Out: 1 [Urine:1] ? ?Recent Labs  ?  11/07/21 ?0442 11/08/21 ?0333 11/09/21 ?0400  ?HGB 9.1* 9.5* 9.7*  ? ?Recent Labs  ?  11/08/21 ?0333 11/09/21 ?0400  ?WBC 7.5 6.3  ?RBC 3.63* 3.73*  ?HCT 31.2* 32.0*  ?PLT 349 349  ? ?Recent Labs  ?  11/08/21 ?0333 11/09/21 ?0400  ?NA 139 139  ?K 3.8 3.8  ?CL 101 105  ?CO2 29 28  ?BUN 10 11  ?CREATININE 0.80 0.66  ?GLUCOSE 106* 117*  ?CALCIUM 9.0 8.8*  ? ?No results for input(s): LABPT, INR in the last 72 hours. ? ? ?EXAM ?General - Patient is Alert, Appropriate, and Oriented ?Extremity - Neurovascular intact ?Sensation intact distally ?Intact pulses distally ?Dorsiflexion/Plantar flexion intact ?Dressing - dressing C/D/I and no drainage, wound vac intact to BLE. No new drainage. Wound vac with white foam changed. Good granulation tissue present with no active drainage or bleeding. ? ? ? ? ? ?Motor Function - intact, moving foot and toes well on exam.  ? ?Past Medical History:  ?Diagnosis Date  ? Depression   ? Engages in [place]   ? PONV (postoperative nausea and vomiting)   ? ? ?Assessment/Plan:   ?2 Days Post-Op Procedure(s) (LRB): ?TRANSESOPHAGEAL ECHOCARDIOGRAM (TEE) (N/A) ?Principal Problem: ?  Back pain ?Active Problems: ?  MRSA bacteremia ?   Polyarthritis ?  Depression ?  Anemia ?  OM (osteomyelitis) ([place]) ?  Discitis ?  Orthostatic hypotension ? ?Estimated body mass index is 29.23 kg/m? as calculated from the following: ?  Height as of this encounter: 5' 5.98" (1.676 m). ?  Weight as of this encounter: 82.1 kg. ?Advance diet ?Up with therapy ?Significant overall improvement in bilateral lower extremities from I&D. ?Continue with wound VAC.  Change wound vac every Monday Wednesday and Friday with white foam. ?Antibiotics per infectious disease. ?Follow up with [facility] orthopedics in 2 weeks for recheck ? ? ? ?T. Rachelle Hora, PA-C ?[place] ?11/09/2021, 12:33 PM ?  ? ? ?

## 2021-11-09 NOTE — Progress Notes (Signed)
? ?Date of Admission:  10/31/2021    ? ? ?ID: Jill Cox is a 46 y.o. female    ?Principal Problem: ?  Back pain ?Active Problems: ?  MRSA bacteremia ?  Polyarthritis ?  Depression ?  Anemia ?  OM (osteomyelitis) ([place]) ?  Discitis ?  Orthostatic hypotension ? ? ? ?Jill Cox is a 46 y.o. 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  ?3/18  forearm I/D and 4th dorsal compartment extensor tenosynovectomy  culture positive for MRSA ?3/20 aspiration  rt knee- No mrsa ?3/20 Aspiration RT ankle - MRSA ?3/20 Rt calf- MRSA ?3/20 Aspiration left left Knee -No MRSA ?3/20 Left thigh abscess- -I/D MRSA ?  ?She also had extensive vertebral involvement ?Rt facet  joint involvement  at T2-T3 /T3-T4  ?Cervical spine- dorsal epidural collection of 5 mm extending C1-C7 with moderate stenosis at C4-5, C5-6, and C6-7 level ?  ?Lumbar MRI done without contrast on 09/28/21 collection at the left aspect of the thecal sac at L5, measuring approximately 10 x 11 x 23 ?Mm. L4-L5 disc involvement was questioned ?  ?  ?She was initially treated with dual MRSA antibiotic with ceftaroline + Dapto and then discharged on dapto to complete 6 weeks until 11/13/21 ?After she went home she was doing fine for a week and then started doing stretching and started noticing pain left lumbar area. ?Also the rt calf I/D site had some foul smell with discharge after the sutures were removed. ?She also feels the left thigh site which has healed has pain and fullness. ?She saw ID physician yesterday at [place] ?And his plan was to continue IV as planned  ? ? ?Subjective: ?Has some dizziness  on walking- better than before ?Pain legs and back much better ? ? ? ?Medications:  ? Chlorhexidine Gluconate Cloth  6 each Topical Daily  ? docusate sodium  100 mg Oral BID  ? feeding supplement  237 mL Oral BID BM  ? ferrous sulfate  325 mg Oral Q breakfast  ? heparin injection (subcutaneous)  5,000 Units Subcutaneous Q8H  ? metroNIDAZOLE  500 mg Oral BID  ? nicotine  7 mg Transdermal Daily  ? sertraline  25 mg Oral Daily  ? sodium chloride flush  10-40 mL Intracatheter Q12H  ? [START ON 11/10/2021] sulfamethoxazole-trimethoprim  2 tablet Oral Q12H  ? ? ?Objective: ?Vital signs in last 24 hours: ?Temp:  [97.7 ?F (36.5 ?C)-98.8 ?F (37.1 ?C)] 98.4 ?F (36.9 ?C) (04/27 1959) ?Pulse Rate:  [55-77] 77 (04/27 1959) ?Resp:  [16-20] 20 (04/27 1959) ?BP: (108-120)/(55-64) 108/64 (04/27 1959) ?SpO2:  [98 %-100 %] 98 % (04/27 1959) ? ?PHYSICAL EXAM:  ?General: Alert, cooperative, no distress, Lungs: b/la air entry ?Heart: Regular rate and rhythm, no murmur, rub or gallop. ?Abdomen: Soft, non-tender,not distended. Bowel sounds normal. No masses ?Extremities:rt calf- I/D wound- smaller ?Left thigh lateral side ? ? ?Lymph: Cervical, supraclavicular normal. ?Neurologic: Grossly non-focal ? ?Lab Results ?Recent Labs  ?  11/08/21 ?0333 11/09/21 ?0400  ?WBC 7.5 6.3  ?HGB 9.5* 9.7*  ?HCT 31.2* 32.0*  ?NA 139 139  ?K 3.8 3.8  ?CL 101 105  ?CO2 29  28  ?BUN 10 11  ?CREATININE 0.80 0.66  ? ?Liver Panel ?Recent Labs  ?  11/08/21 ?0333 11/09/21 ?0400  ?PROT 6.8 7.2  ?ALBUMIN 3.0* 3.1*  ?AST 14* 13*  ?ALT 15 13  ?ALKPHOS 59 60  ?BILITOT 0.9 0.7  ? ? 3/11, 3/12, 3/13 and 3/15 BC- MRSA ?3/16,3/17, 3/19  BC NG ?Microbiology: ?[facility] 4/18 MRSA from PICC site ?Rt calf wound culture Proteus ?Left thigh wound culture negative ?Culture from the L5-S1 area after washout this MRSA ? ?Studies/Results: ?[place] EKG SITE RITE ? ?Result Date: 11/08/2021 ?If [employer] not attached, placement could not be confirmed due to current cardiac rhythm.  ? ?Reviewed  ultrasound personally ? ?Mid calf abscess rt ? ?Assessment/Plan: ?Recent disseminated MRSA infection in March 2023 with multiple joints involvement and pyomyositis and spine involvement ?Had multiple I/D at [facility] and after 2 weeks was discharged on Iv dapto on 10/06/21 ?Readmitted with new lower lumbar pain ?MRI L5-S1 discitis and abscess ? ? ?1) Rt calf muscle- re-accumulation of abscess ?S/p I/D  and culture is Proteus on ceftaroline.  Also has a wound VAC ? ?2) Left thigh - re-accumulation of abscess s/p I/D - neg culture.  Has wound VAC ? ?3) MRSA bacteremia from  blood taken rt PICC line- PICC removed- Repeat blood culture neg  ?TEE  no vegetation ?On vanco and ceftaroline- susceptibiltis available MIC of Dapto<1 ?Pt will go on IV dapto 10mg /kg for another 5 weeks ?Will also get bactrim DS 2 BID  to treat proteus and MRSA X 2 weeks ? ?Flagyl 500mg  BID for bacteroides in the abscess X 2 weeks ? ? ?4) L5-S1 discitis and abscess ?S/p washout- culture MRSA ? ?5) Pilonidal sinus- culture shows proteus-on ceftaroline ? ?Discussed the management with patient and ?her husband and hospitalist and pharmacist ? ?

## 2021-11-09 NOTE — Treatment Plan (Signed)
Diagnosis: ?Disseminated MRSA infection ?Baseline Creatinine <1 ? ?Culture Result: MRSA ? ?No Known Allergies ? ?OPAT Orders ?Discharge antibiotics: ?Daptomycin ( 28m/kg)  8063mIv every 24 hrs ?Duration: ?Total of 6 weeks ?End Date: ?12/14/21 ? ?[facility] ? ?Labs weekly while on IV antibiotics: ?_X_ CBC with differential ? ?_X_ CMP ?_X_ CRP ?_X_ ESR ?_X_  CK ? ?_X_ Please pull PIC at completion of IV antibiotics ?_ ? ?Fax weekly labs to [phone] ?Clinic Follow Up Appt: 11/28/21 at 9.30AM ? ? ?Call [phone] any questions ? ?  ?

## 2021-11-10 DIAGNOSIS — M4647 Discitis, unspecified, lumbosacral region: Secondary | ICD-10-CM | POA: Diagnosis not present

## 2021-11-10 DIAGNOSIS — D649 Anemia, unspecified: Secondary | ICD-10-CM | POA: Diagnosis not present

## 2021-11-10 DIAGNOSIS — B9562 Methicillin resistant Staphylococcus aureus infection as the cause of diseases classified elsewhere: Secondary | ICD-10-CM | POA: Diagnosis not present

## 2021-11-10 DIAGNOSIS — R7881 Bacteremia: Secondary | ICD-10-CM | POA: Diagnosis not present

## 2021-11-10 DIAGNOSIS — M4656 Other infective spondylopathies, lumbar region: Secondary | ICD-10-CM | POA: Diagnosis not present

## 2021-11-10 DIAGNOSIS — I951 Orthostatic hypotension: Secondary | ICD-10-CM

## 2021-11-10 DIAGNOSIS — M4620 Osteomyelitis of vertebra, site unspecified: Secondary | ICD-10-CM | POA: Diagnosis not present

## 2021-11-10 LAB — MISC LABCORP TEST (SEND OUT): Labcorp test code: 88005

## 2021-11-10 LAB — COMPREHENSIVE METABOLIC PANEL
ALT: 14 U/L (ref 0–44)
AST: 13 U/L — ABNORMAL LOW (ref 15–41)
Albumin: 3.1 g/dL — ABNORMAL LOW (ref 3.5–5.0)
Alkaline Phosphatase: 58 U/L (ref 38–126)
Anion gap: 8 (ref 5–15)
BUN: 11 mg/dL (ref 6–20)
CO2: 26 mmol/L (ref 22–32)
Calcium: 9.1 mg/dL (ref 8.9–10.3)
Chloride: 103 mmol/L (ref 98–111)
Creatinine, Ser: 0.68 mg/dL (ref 0.44–1.00)
GFR, Estimated: >60 mL/min (ref >60)
Glucose, Bld: 112 mg/dL — ABNORMAL HIGH (ref 70–99)
Potassium: 3.7 mmol/L (ref 3.5–5.1)
Sodium: 137 mmol/L (ref 135–145)
Total Bilirubin: 0.6 mg/dL (ref 0.3–1.2)
Total Protein: 7.3 g/dL (ref 6.5–8.1)

## 2021-11-10 LAB — CBC
HCT: 31.6 % — ABNORMAL LOW (ref 36.0–46.0)
Hemoglobin: 9.5 g/dL — ABNORMAL LOW (ref 12.0–15.0)
MCH: 25.6 pg — ABNORMAL LOW (ref 26.0–34.0)
MCHC: 30.1 g/dL (ref 30.0–36.0)
MCV: 85.2 fL (ref 80.0–100.0)
Platelets: 362 10*3/uL (ref 150–400)
RBC: 3.71 MIL/uL — ABNORMAL LOW (ref 3.87–5.11)
RDW: 14.9 % (ref 11.5–15.5)
WBC: 6.4 10*3/uL (ref 4.0–10.5)
nRBC: 0 % (ref 0.0–0.2)

## 2021-11-10 LAB — SUSCEPTIBILITY, AER + ANAEROB

## 2021-11-10 LAB — MAGNESIUM: Magnesium: 1.9 mg/dL (ref 1.7–2.4)

## 2021-11-10 LAB — SUSCEPTIBILITY RESULT

## 2021-11-10 MED ORDER — LIDOCAINE 4 % EX CREA
TOPICAL_CREAM | Freq: Four times a day (QID) | CUTANEOUS | Status: DC | PRN
  Filled 2021-11-10: qty 5

## 2021-11-10 MED ORDER — MIDODRINE HCL 5 MG PO TABS
5.0000 mg | ORAL_TABLET | Freq: Two times a day (BID) | ORAL | Status: DC
  Administered 2021-11-10 – 2021-11-12 (×4): 5 mg via ORAL
  Filled 2021-11-10 (×4): qty 1

## 2021-11-10 MED ORDER — POLYETHYLENE GLYCOL 3350 17 G PO PACK
17.0000 g | PACK | Freq: Two times a day (BID) | ORAL | Status: DC
  Administered 2021-11-10 – 2021-11-12 (×4): 17 g via ORAL
  Filled 2021-11-10 (×4): qty 1

## 2021-11-10 NOTE — Progress Notes (Signed)
PT Cancellation Note ? ?Patient Details ?Name: Jill Cox ?MRN: [id number] ?DOB: Dec 09, 1975 ? ? ?Cancelled Treatment:    Reason Eval/Treat Not Completed: Patient declined, no reason specified. Pt received prone with family member at bedside. Pt and family member declining PT as she is having significant pain from constipation. Requesting PT to re-attempt reassessment tomorrow. Attending MD informed. Will re-attempt as able. ? ? ?Delphia Grates. Fairly IV, PT, DPT ?Physical Therapist- [place]  ?[facility]  ?11/10/2021, 1:48 PM ?

## 2021-11-10 NOTE — Progress Notes (Signed)
? ? ?MEDICINE DAILY PROGRESS NOTE ? ?Patient: Jill Cox 46 y.o. female ?MRN: [id number] ? ?Today is hospital day 9 after presenting to ED on 10/31/2021  5:46 PM with back pain. 46 year old with recent extensive hospitalization for MRSA bacteremia, multiple septic joints, epidural abscess treated conservatively and on IV daptomycin at home through 5/1 with a PICC line.  Followed up at ID clinic and deemed stable to complete antibiotics but presented back to the ER after going home from clinic with worsening back pain. ? ? 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 Admitted due to worsening back pain and suspected not relieving epidural abscess. Patient grew MRSA again.   ?underwent incision and drainage by orthopedic on 4/20 for bilateral lower extremity abscess.  ?underwent I&D of L5-S1 on 4/21 by neurosurgery ?Echocardiogram was negative, TEE showed no endocarditis  ?ID following, was awaiting culture/MIC which was available 11/09/21, need to keep patient overnight to confirm home antibiotics can be available.  ?Was hoping to d/c home 11/10/21 however significant orthostatic hypotension presents fall risk, risk for readmission. AM cortisol level pending, started midodrine.  ? ?Procedures and Significant Results:  ?11/02/21 I&D bilateral LE, wound vac in place  ?11/03/21 I&D L5-S1, lumbar drain removed 11/06/21 ?TTE and TEE  ?PICC removed, replaced 04/08/21/21 ? ?Consultants:  ?Infectious disease ?Orthopedics  ?Neurosurgery  ? ? ? ?SUBJECTIVE:  ?Pt seen and examined at bedside, resting comfortably, parents and husband present in the room. She denies fever, she reports pain is controlled.  She is significantly anxious about the orthostatic hypotension/dizziness and is asking about SNF/rehab versus home with home health. ? ? ? ? ? ? ? ? ?ASSESSMENT & PLAN ? ?Back pai ?OM  (osteomyelitis) ([place]) ?Discitis ?Polyarthritis ?MRSA bacteremia ?Initially cultures were positive on 3/12 until 3/15 thereafter became negative and discharged home on daptomycin.   ?Readmitted on 4/18 due to positive bacteremia. Currently on IV vancomycin and ceftaroline followed by infectious disease. ?Underwent incision and drainage by neurosurgery on 4/21 with drain placement. Her lumbar drain was removed by neurosurgery 4/24. ?Status post incision and drainage by orthopedic of her bilateral lower extremity on 4/20.  Managed by orthopedic ?Pain control ?TTE and TEE, no vegetations  ?Blood cultures 4/22-NGTD ?PICC Removed.  Can be placed again closer to discharge ? ?Anemia ?Depression ?controlled ? ?Orthostatic hypotension ?Encourage increased po hydration, can liberalize salt intake  ?Main barrier to discharge at this point given substantial orthostatic vital signs and symptoms, concern for high readmit/fall risk if she were to go home.  Have asked PT to reassess.  Started midodrine today.  A.m. cortisol pending.  Patient inquires about SNF/rehab ? ?  ?VTE Ppx: heparin sq ?CODE STATUS: FULL ?Admitted from: home ?Expected Dispo: home w/ home health PT ?Barriers to discharge: appreciate recs from specialists re: antibiotics and ID follow up on discharge, need to arrange home health  ?Family communication: parents at bedside ? ? ? ? ? ? ? ? ? ? ? ? ? ?Objective: ?Vital signs in last 24 hours: ?Temp:  [98.4 ?F (36.9 ?C)-98.8 ?F (37.1 ?C)] 98.7 ?F (37.1 ?C) (04/28 [id number]) ?Pulse Rate:  [58-77] 58 (04/28 0909) ?Resp:  [16-20] 16 (04/28 0909) ?BP: (105-125)/(57-66) 125/66 (04/28 0909) ?SpO2:  [98 %-100 %] 100 % (04/28 0909) ?Weight change:  ?Last BM Date : 10/31/21 ? ?Intake/Output from previous day: ?04/27 0701 - 04/28 0700 ?In: 1258.5 [  P.O.:720; I.V.:10; IV Piggyback:528.5] ?Out: 1 [Urine:1] ?Intake/Output this shift: ?No intake/output data recorded. ? ?Constitutional:  ?VSS, see nurse notes ?General Appearance: alert,  well-developed, well-nourished, NAD ?Eyes: ?Normal lids and conjunctive, non-icteric sclera ?Ears, Nose, Mouth, Throat: ?Normal appearance ?Neck: ?No masses, trachea midline ?Respiratory: ?Normal respiratory effort ?Breath sounds normal, no wheeze/rhonchi/rales ?Cardiovascular: ?S1/S2 normal, no murmur/rub/gallop auscultated ?No lower extremity edema ?Gastrointestinal: ?Nontender, no masses ?Musculoskeletal:  ?No clubbing/cyanosis of digits ?Neurological: ?No cranial nerve deficit on limited exam ?Psychiatric: ?Normal judgment/insight ?Normal mood and affect ? ? ?Lab Results: ?Recent Labs  ?  11/09/21 ?0400 11/10/21 ?0530  ?WBC 6.3 6.4  ?HGB 9.7* 9.5*  ?HCT 32.0* 31.6*  ?PLT 349 362  ? ? ?BMET ?Recent Labs  ?  11/09/21 ?0400 11/10/21 ?0530  ?NA 139 137  ?K 3.8 3.7  ?CL 105 103  ?CO2 28 26  ?GLUCOSE 117* 112*  ?BUN 11 11  ?CREATININE 0.66 0.68  ?CALCIUM 8.8* 9.1  ? ? ? ?Studies/Results: ?No results found. ? ?Medications: I have reviewed the patient's current medications. ?Scheduled: ? Chlorhexidine Gluconate Cloth  6 each Topical Daily  ? docusate sodium  100 mg Oral BID  ? feeding supplement  237 mL Oral BID BM  ? ferrous sulfate  325 mg Oral Q breakfast  ? heparin injection (subcutaneous)  5,000 Units Subcutaneous Q8H  ? metroNIDAZOLE  500 mg Oral BID  ? midodrine  5 mg Oral BID WC  ? nicotine  7 mg Transdermal Daily  ? sertraline  25 mg Oral Daily  ? sodium chloride flush  10-40 mL Intracatheter Q12H  ? sulfamethoxazole-trimethoprim  2 tablet Oral Q12H  ? ?Continuous: ? DAPTOmycin (CUBICIN)  IV 800 mg (11/10/21 1007)  ? methocarbamol (ROBAXIN) IV    ? ?[id number], cyclobenzaprine, hydrALAZINE, ibuprofen, ipratropium-albuterol, methocarbamol **OR** methocarbamol (ROBAXIN) IV, metoCLOPramide **OR** metoCLOPramide (REGLAN) injection, metoprolol tartrate, morphine injection, ondansetron **OR** ondansetron (ZOFRAN) IV, oxyCODONE, polyethylene glycol, senna-docusate, sodium chloride flush, traZODone,  zolpidem ?Anti-infectives (From admission, onward)  ? ? Start     Dose/Rate Route Frequency Ordered Stop  ? 11/10/21 1000  sulfamethoxazole-trimethoprim (BACTRIM DS) 800-160 MG per tablet 2 tablet       ? 2 tablet Oral Every 12 hours 11/09/21 1634 11/24/21 0959  ? 11/10/21 0800  DAPTOmycin (CUBICIN) 800 mg in sodium chloride 0.9 % IVPB       ? 10 mg/kg ? 82.1 kg ?132 mL/hr over 30 Minutes Intravenous Daily 11/09/21 1634    ? 11/09/21 1000  metroNIDAZOLE (FLAGYL) tablet 500 mg       ? 500 mg Oral 2 times daily 11/09/21 0904 11/23/21 0959  ? 11/08/21 0600  vancomycin (VANCOCIN) IVPB 1000 mg/200 mL premix       ? 1,000 mg ?200 mL/hr over 60 Minutes Intravenous Every 12 hours 11/07/21 1435 11/09/21 1852  ? 11/04/21 2200  vancomycin (VANCOCIN) IVPB 1000 mg/200 mL premix  Status:  Discontinued       ? 1,000 mg ?200 mL/hr over 60 Minutes Intravenous Every 12 hours 11/04/21 1131 11/04/21 1131  ? 11/04/21 1230  vancomycin (VANCOREADY) IVPB 1500 mg/300 mL  Status:  Discontinued       ? 1,500 mg ?150 mL/hr over 120 Minutes Intravenous Every 12 hours 11/04/21 1131 11/04/21 1141  ? 11/04/21 1230  Vancomycin (VANCOCIN) 1,500 mg in sodium chloride 0.9 % 500 mL IVPB       ? 1,500 mg ?250 mL/hr over 120 Minutes Intravenous Every 12 hours 11/04/21 1141 11/07/21 1420  ? 11/03/21  1100  Vancomycin (VANCOCIN) 1,500 mg in sodium chloride 0.9 % 500 mL IVPB  Status:  Discontinued       ? 1,500 mg ?250 mL/hr over 120 Minutes Intravenous Every 12 hours 11/03/21 1006 11/04/21 1131  ? 11/03/21 0900  vancomycin (VANCOREADY) IVPB 1500 mg/300 mL  Status:  Discontinued       ? 1,500 mg ?150 mL/hr over 120 Minutes Intravenous Every 12 hours 11/03/21 0719 11/03/21 1004  ? 11/02/21 2200  ceftaroline (TEFLARO) 600 mg in sodium chloride 0.9 % 100 mL IVPB  Status:  Discontinued       ? 600 mg ?100 mL/hr over 60 Minutes Intravenous Every 8 hours 11/02/21 1652 11/09/21 2359  ? 11/01/21 1000  vancomycin (VANCOREADY) IVPB 750 mg/150 mL  Status:  Discontinued        ? 750 mg ?150 mL/hr over 60 Minutes Intravenous Every 8 hours 11/01/21 0109 11/03/21 0719  ? 11/01/21 0300  ceFEPIme (MAXIPIME) 2 g in sodium chloride 0.9 % 100 mL IVPB  Status:  Discontinued       ? 2 g ?200 mL/hr over

## 2021-11-10 NOTE — Progress Notes (Signed)
Patient experiencing a lot of pain from impacted stool. Enema administered. Mild relief. MD Alexander aware. New order for prn lidocaine and daily miralax.  ?

## 2021-11-10 NOTE — Progress Notes (Signed)
? ?Date of Admission:  10/31/2021    ? ? ?ID: Jill Cox is a 46 y.o. female    ?Principal Problem: ?  Back pain ?Active Problems: ?  MRSA bacteremia ?  Polyarthritis ?  Depression ?  Anemia ?  OM (osteomyelitis) ([place]) ?  Discitis ?  Orthostatic hypotension ? ? ? ?Jill Cox is a 46 y.o. 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  ?3/18  forearm I/D and 4th dorsal compartment extensor tenosynovectomy  culture positive for MRSA ?3/20 aspiration  rt knee- No mrsa ?3/20 Aspiration RT ankle - MRSA ?3/20 Rt calf- MRSA ?3/20 Aspiration left left Knee -No MRSA ?3/20 Left thigh abscess- -I/D MRSA ?  ?She also had extensive vertebral involvement ?Rt facet  joint involvement  at T2-T3 /T3-T4  ?Cervical spine- dorsal epidural collection of 5 mm extending C1-C7 with moderate stenosis at C4-5, C5-6, and C6-7 level ?  ?Lumbar MRI done without contrast on 09/28/21 collection at the left aspect of the thecal sac at L5, measuring approximately 10 x 11 x 23 ?Mm. L4-L5 disc involvement was questioned ?  ?  ?She was initially treated with dual MRSA antibiotic with ceftaroline + Dapto and then discharged on dapto to complete 6 weeks until 11/13/21 ?After she went home she was doing fine for a week and then started doing stretching and started noticing pain left lumbar area. ?Also the rt calf I/D site had some foul smell with discharge after the sutures were removed. ?She also feels the left thigh site which has healed has pain and fullness. ?She saw ID physician yesterday at [place] ?And his plan was to continue IV as planned  ? ? ?Subjective: ?Pt has dizziness on  standing ?Severely constipated ? ? ? ?Medications:  ? Chlorhexidine Gluconate Cloth  6 each Topical Daily  ? docusate sodium  100 mg Oral BID  ? feeding supplement  237 mL Oral BID BM  ? ferrous sulfate  325 mg Oral Q breakfast  ? heparin injection (subcutaneous)  5,000 Units Subcutaneous Q8H  ? metroNIDAZOLE  500 mg Oral BID  ? midodrine  5 mg Oral BID WC  ? nicotine  7 mg Transdermal Daily  ? polyethylene glycol  17 g Oral BID  ? sertraline  25 mg Oral Daily  ? sodium chloride flush  10-40 mL Intracatheter Q12H  ? sulfamethoxazole-trimethoprim  2 tablet Oral Q12H  ? ? ?Objective: ?Vital signs in last 24 hours: ?Temp:  [97.8 ?F (36.6 ?C)-98.7 ?F (37.1 ?C)] 98.6 ?F (37 ?C) (04/28 1955) ?Pulse Rate:  [58-91] 91 (04/28 1955) ?Resp:  [16-20] 20 (04/28 1955) ?BP: (105-141)/(57-76) 141/71 (04/28 1955) ?SpO2:  [98 %-100 %] 98 % (04/28 1955) ? ?PHYSICAL EXAM:  ?General: Alert, cooperative, some  distress, Lungs: b/la air entry ?Heart: Regular rate and rhythm, no murmur, rub or gallop. ?Abdomen: Soft, non-tender,not distended. Bowel sounds normal. No masses ?Extremities:rt calf- I/D wound- smaller ?Left thigh lateral side ? ? ?Lymph: Cervical, supraclavicular normal. ?Neurologic: Grossly non-focal ? ?Lab Results ?Recent Labs  ?  11/09/21 ?0400 11/10/21 ?0530  ?WBC 6.3 6.4  ?HGB 9.7* 9.5*  ?HCT 32.0* 31.6*  ?NA 139 137  ?K  3.8 3.7  ?CL 105 103  ?CO2 28 26  ?BUN 11 11  ?CREATININE 0.66 0.68  ? ?Liver Panel ?Recent Labs  ?  11/09/21 ?0400 11/10/21 ?0530  ?PROT 7.2 7.3  ?ALBUMIN 3.1* 3.1*  ?AST 13* 13*  ?ALT 13 14  ?ALKPHOS 60 58  ?BILITOT 0.7 0.6  ? ? 3/11, 3/12, 3/13 and 3/15 BC- MRSA ?3/16,3/17, 3/19  BC NG ?Microbiology: ?[facility] 4/18 MRSA from PICC site ?Rt calf wound culture Proteus ?Left thigh wound culture negative ?Culture from the L5-S1 area after washout this MRSA ? ?Studies/Results: ?No results found. ? ?Reviewed ultrasound personally ? ?Mid calf abscess rt ? ?Assessment/Plan: ?Recent disseminated MRSA infection in March  2023 with multiple joints involvement and pyomyositis and spine involvement ?Had multiple I/D at [facility] and after 2 weeks was discharged on Iv dapto on 10/06/21 ?Readmitted with new lower lumbar pain ?MRI L5-S1 discitis and abscess ? ? ?1) Rt calf muscle- re-accumulation of abscess ?S/p I/D  and culture is Proteus on ceftaroline.  Also has a wound VAC ? ?2) Left thigh - re-accumulation of abscess s/p I/D - neg culture.  Has wound VAC ? ?3) MRSA bacteremia from  blood taken rt PICC line- PICC removed- Repeat blood culture neg  ?TEE  no vegetation ?On Dapto  ( 5 weeks) and bactrim  ( 2 weeks) -Will also get bactrim DS 2 BID  to treat proteus  X 2 weeks ? ?Flagyl 500mg  BID for bacteroides in the abscess X 2 weeks ? ? ?4) L5-S1 discitis and abscess ?S/p washout- culture MRSA ? ?5) Pilonidal sinus- culture shows proteus-on bactrim ? ?6) constipation ? ?Anemia ? ?Hypoalbuminemia ? ?Dizziness with orthostasis- started midodrine ? ?Discussed the management with patient and ?her husband and her nurses ? ?ID will follow her peripherally this weekend ?Call if needed ? ?

## 2021-11-10 NOTE — Progress Notes (Signed)
Occupational Therapy Treatment ?Patient Details ?Name: Jill Cox ?MRN: [id number] ?DOB: 1975-07-29 ?Today's Date: 11/10/2021 ? ? ?History of present illness Pt admitted for back pain secondary to MRSA infection. Pt is POD 2 from L5-S1 evacuation of epidural abscess and POD 3 from I&D of L thigh and R calf with wound vacs. ?  ?OT comments ? Pt seen for OT session.  Orthostatic taken in prep for shower.  Supine 137/76, sitting 106/63, standing 88/50 (unable to tolerate standing 3 min and had to sit after 2 min).  Nursing notified of vitals and relayed to MD.  Unable to shower d/t orthostatics.  Pt sat EOB to don shirt with set up.  Donned new diaper via rolling in supine.  Bed mobility today with modified indep, extra time and use of rails.  Reinforced ADL safety, making sure to make slow positional changes during ADL completion to allow body to acclimate.  Pt verbalized understanding.  Pt states that parents are now in town from out of state and mother is able to stay an extra week to assist pt as needed since spouse works from home. Left pt in bed with all necessary items within reach.  ? ?Recommendations for follow up therapy are one component of a multi-disciplinary discharge planning process, led by the attending physician.  Recommendations may be updated based on patient status, additional functional criteria and insurance authorization. ?   ?Follow Up Recommendations ? No OT follow up  ?  ?Assistance Recommended at Discharge PRN  ?Patient can return home with the following ? A little help with walking and/or transfers;A little help with bathing/dressing/bathroom;Assistance with cooking/housework ?  ?Equipment Recommendations ? None recommended by OT  ?  ?Recommendations for Other Services   ? ?  ?Precautions / Restrictions Precautions ?Precautions: Fall ?Precaution Comments: Mod fall risk; orthostatic hypotension ?Restrictions ?Weight Bearing Restrictions: No  ? ? ?  ? ?Mobility Bed Mobility ?Overal bed  mobility: Modified Independent ?Bed Mobility: Supine to Sit, Sit to Supine ?  ?  ?Supine to sit: Modified independent (Device/Increase time) ?Sit to supine: Modified independent (Device/Increase time) ?  ?General bed mobility comments: Pt able to utilize bed rails and adjust HOB and foot of bed as needed ?Patient Response: Cooperative ? ?Transfers ?Overall transfer level: Needs assistance ?Equipment used: Rolling walker (2 wheels) ?Transfers: Sit to/from Stand ?Sit to Stand: Supervision, From elevated surface ?  ?  ?  ?  ?  ?  ?  ?  ?Balance Overall balance assessment: Needs assistance ?Sitting-balance support: Feet supported ?Sitting balance-Leahy Scale: Good ?Sitting balance - Comments: EOB sitting for orthostatics and UB dressing ?  ?Standing balance support: Bilateral upper extremity supported, During functional activity, Reliant on assistive device for balance ?Standing balance-Leahy Scale: Fair ?Standing balance comment: Pt reports putting more weight into her BUEs into the walker d/t back discomfort and LE weakness ?  ?  ?  ?  ?  ?  ?  ?  ?  ?  ?  ?   ? ?ADL either performed or assessed with clinical judgement  ? ?ADL Overall ADL's : Needs assistance/impaired ?  ?  ?Grooming: Applying deodorant;Set up ?Grooming Details (indicate cue type and reason): set up at bed level d/t orthostatic hypotension ?  ?  ?  ?  ?Upper Body Dressing : Set up;Sitting ?Upper Body Dressing Details (indicate cue type and reason): required OT to retrieve tshirt from across room d/t orthostatic hypotension ?Lower Body Dressing: Moderate assistance ?Lower Body Dressing Details (indicate cue  type and reason): pt able to doff socks in sitting by pushing off with feet.  Pt states spouse did obtain reachers for her at home.  Donned new diaper in supine d/t orthostatic hypotension.  Pt assisted with rolling and bridging. ?  ?  ?  ?  ?  ?  ?Functional mobility during ADLs: Rolling walker (2 wheels) ?General ADL Comments: sit to stand from  raised bed with SBA and RW. ?  ? ?Extremity/Trunk Assessment Upper Extremity Assessment ?Upper Extremity Assessment: Overall WFL for tasks assessed ?  ?Lower Extremity Assessment ?Lower Extremity Assessment: Generalized weakness ?  ?Cervical / Trunk Assessment ?Cervical / Trunk Assessment: Normal ?  ? ?   ?  ?  ?   ?  ?   ?  ? ?Cognition Arousal/Alertness: Awake/alert ?Behavior During Therapy: [facility] for tasks assessed/performed ?Overall Cognitive Status: Within Functional Limits for tasks assessed ?  ?  ?  ?  ?  ?  ?  ?  ?  ?  ?  ?  ?  ?  ?  ?  ?General Comments: pleasant and agreeable to therapy ?  ?  ?   ?Exercises Other Exercises ?Other Exercises: Reinforced slow positional changes during ADL completion to allow body to acclimate ? ?  ?   ? ? ?  ?    ? ? ?Pertinent Vitals/ Pain       Pain Assessment ?Pain Assessment: 0-10 ?Pain Score: 4  ?Pain Location: low back ?Pain Descriptors / Indicators: Aching, Guarding, Discomfort, Grimacing ?Pain Intervention(s): Monitored during session, Repositioned, Limited activity within patient's tolerance ? ?   ?  ?  ?  ?  ?  ?  ?  ?  ?  ?  ?  ?  ?  ?  ?  ?  ?  ?  ? ?  ?    ?  ?  ?  ?   ? ?Frequency ? Min 2X/week  ? ? ? ? ?  ?Progress Toward Goals ? ?OT Goals(current goals can now be found in the care plan section) ? Progress towards OT goals: Progressing toward goals ? ?Acute Rehab OT Goals ?Patient Stated Goal: to be stronger ?OT Goal Formulation: With patient ?Time For Goal Achievement: 11/20/21 ?Potential to Achieve Goals: Good  ?Plan Discharge plan remains appropriate;Frequency remains appropriate   ? ?Co-evaluation ? ? ?   ?  ?  ?  ?  ? ?  ?AM-PAC OT "6 Clicks" Daily Activity     ?Outcome Measure ? ? Help from another person eating meals?: None ?Help from another person taking care of personal grooming?: A Little ?Help from another person toileting, which includes using toliet, bedpan, or urinal?: A Little ?Help from another person bathing (including washing, rinsing,  drying)?: A Little ?Help from another person to put on and taking off regular upper body clothing?: A Little ?Help from another person to put on and taking off regular lower body clothing?: A Lot ?6 Click Score: 18 ? ?  ?End of Session Equipment Utilized During Treatment: Rolling walker (2 wheels) ? ?OT Visit Diagnosis: Muscle weakness (generalized) (M62.81);Unsteadiness on feet (R26.81) ?  ?Activity Tolerance Other (comment) (orthostatic hypotension limited shower completion) ?  ?Patient Left in bed;with call bell/phone within reach ?  ?Nurse Communication Other (comment) (orthostatics) ?  ? ?   ? ?Time: [id number] ?OT Time Calculation (min): 33 min ? ?Charges: OT General Charges ?$OT Visit: 1 Visit ?OT Treatments ?$Self Care/Home Management : 23-37 mins ?Leta Speller,  MS, OTR/L ? ? ?Jill Cox ?11/10/2021, 10:07 AM ?

## 2021-11-10 NOTE — Care Management (Signed)
Pre-rounds chart review and preliminary plan / goals for today: ?Significant overnight events per chart: none ?Significant new labs/other diagnostics:  ?Awaiting AM cortisol levels ?Significantly orthostatic, OT unable to get pt to the shower ?Dispo plan:  ?Was hoping to d/c home but if ambulatory dysfunction d/t orthostatic hypotension makes this unsafe then may need to consider SNF ?ID has made recommendations for abx ?Pending/Plan:  ?Appreciate PT recs re: change in status / need for SNF until orthostatic dizziness/hypotension improves ?Appreciate TOC to confirm all HH in place / PICC abx in place vs needs for SNF pending PT recs  ? ? ?Please feel free to reach out via secure chat in Epic for non-urgent issues and updates.  ? ?Please page for urgent matters! ? ?This note will be updated after rounds to full progress note / discharge note.  ? ?

## 2021-11-10 NOTE — Progress Notes (Signed)
PHARMACY CONSULT NOTE FOR: ? ?OUTPATIENT  PARENTERAL ANTIBIOTIC THERAPY (OPAT) ? ?Indication: Disseminated MRSA infection ?Regimen: Daptomycin 800mg  IV q24h ?End date: 12/14/2021 ? ?Note - also on following oral antibiotics ?Metronidazole 500mg  BID x 14 days ?TMP/SMZ 2 DS tabs BIC x 14 days ? ?IV antibiotic discharge orders are pended. ?To discharging provider:  please sign these orders via discharge navigator,  ?Select New Orders & click on the button choice - Manage This Unsigned Work.  ?  ? ?Thank you for allowing pharmacy to be a part of this patient's care. ? ?Doreene Eland, PharmD, BCPS, BCIDP ?Work Cell: [phone] ?11/10/2021 9:04 AM ? ? ? ?

## 2021-11-11 LAB — CBC
HCT: 33.7 % — ABNORMAL LOW (ref 36.0–46.0)
Hemoglobin: 10.3 g/dL — ABNORMAL LOW (ref 12.0–15.0)
MCH: 25.4 pg — ABNORMAL LOW (ref 26.0–34.0)
MCHC: 30.6 g/dL (ref 30.0–36.0)
MCV: 83.2 fL (ref 80.0–100.0)
Platelets: 371 10*3/uL (ref 150–400)
RBC: 4.05 MIL/uL (ref 3.87–5.11)
RDW: 14.8 % (ref 11.5–15.5)
WBC: 9.8 10*3/uL (ref 4.0–10.5)
nRBC: 0 % (ref 0.0–0.2)

## 2021-11-11 LAB — BASIC METABOLIC PANEL
Anion gap: 9 (ref 5–15)
BUN: 13 mg/dL (ref 6–20)
CO2: 25 mmol/L (ref 22–32)
Calcium: 9.2 mg/dL (ref 8.9–10.3)
Chloride: 103 mmol/L (ref 98–111)
Creatinine, Ser: 0.71 mg/dL (ref 0.44–1.00)
GFR, Estimated: >60 mL/min (ref >60)
Glucose, Bld: 131 mg/dL — ABNORMAL HIGH (ref 70–99)
Potassium: 3.5 mmol/L (ref 3.5–5.1)
Sodium: 137 mmol/L (ref 135–145)

## 2021-11-11 LAB — CORTISOL-AM, BLOOD: Cortisol - AM: 14 ug/dL (ref 6.7–22.6)

## 2021-11-11 LAB — MAGNESIUM: Magnesium: 1.9 mg/dL (ref 1.7–2.4)

## 2021-11-11 MED ORDER — SENNOSIDES-DOCUSATE SODIUM 8.6-50 MG PO TABS
3.0000 | ORAL_TABLET | Freq: Two times a day (BID) | ORAL | Status: AC
  Administered 2021-11-11 – 2021-11-12 (×2): 3 via ORAL
  Filled 2021-11-11 (×2): qty 3

## 2021-11-11 MED ORDER — MILK AND MOLASSES ENEMA
1.0000 | Freq: Once | RECTAL | Status: AC
  Administered 2021-11-11: 240 mL via RECTAL
  Filled 2021-11-11: qty 240

## 2021-11-11 MED ORDER — LACTULOSE 10 GM/15ML PO SOLN
20.0000 g | Freq: Two times a day (BID) | ORAL | Status: DC
  Administered 2021-11-11 – 2021-11-12 (×2): 20 g via ORAL
  Filled 2021-11-11 (×2): qty 30

## 2021-11-11 NOTE — TOC Transition Note (Addendum)
Transition of Care (TOC) - CM/SW Discharge Note ? ? ?Patient Details  ?Name: Noell Lorensen ?MRN: [id number] ?Date of Birth: [birth year] ? ?Transition of Care (TOC) CM/SW Contact:  ?Beula Joyner E Goldman Birchall, LCSW ?Phone Number: ?11/11/2021, 2:14 PM ? ? ?Clinical Narrative:   Patient to DC home today with home health, wound vac, and home IV medications.  ?CSW confirmed with Pam with [facility] and Jason with [facility].  ?Per Pam, patient will need to get today's dose prior to DC today, notified DO and RN.  ? ?3:44- Per DO, DC delayed to tomorrow at 9 am. Notified Pam and Barbara Cower.  ? ?Final next level of care: Home w Home Health Services ?Barriers to Discharge: Barriers Resolved ? ? ?Patient Goals and CMS Choice ?  ?  ?  ? ?Discharge Placement ?  ?           ?  ?  ?  ?  ? ?Discharge Plan and Services ?  ?  ?           ?DME Arranged: IV pump/equipment ?DME Agency: Other - Comment ([facility]) ?Date DME Agency Contacted: 11/11/21 ?  ?Representative spoke with at DME Agency: Pam ?HH Arranged: RN ?HH Agency: [facility] ?Date HH Agency Contacted: 11/11/21 ?  ?Representative spoke with at [facility] Agency: Barbara Cower ? ?Social Determinants of Health (SDOH) Interventions ?  ? ? ?Readmission Risk Interventions ?   ? View : No data to display.  ?  ?  ?  ? ? ? ? ? ?

## 2021-11-11 NOTE — Progress Notes (Signed)
? ? ?MEDICINE DAILY PROGRESS NOTE ? ?Patient: Jill Cox 46 y.o. female ?MRN: [id number] ? ?Today is hospital day 10 after presenting to ED on 10/31/2021  5:46 PM with back pain. 46 year old with recent extensive hospitalization for MRSA bacteremia, multiple septic joints, epidural abscess treated conservatively and on IV daptomycin at home through 5/1 with a PICC line.  Followed up at ID clinic and deemed stable to complete antibiotics but presented back to the ER after going home from clinic with worsening back pain. ? ? 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 Admitted due to worsening back pain and suspected not relieving epidural abscess. Patient grew MRSA again.   ?underwent incision and drainage by orthopedic on 4/20 for bilateral lower extremity abscess.  ?underwent I&D of L5-S1 on 4/21 by neurosurgery ?Echocardiogram was negative, TEE showed no endocarditis  ?ID following, was awaiting culture/MIC which was available 11/09/21, need to keep patient overnight to confirm home antibiotics can be available.  ?Was hoping to d/c home 11/10/21 however significant orthostatic hypotension presents fall risk, risk for readmission. AM cortisol level pending, started midodrine. 11/11/21 pt reports she will not have her husband home until very late tonight / early tomorrow to help her with her needs.  ? ?Procedures and Significant Results:  ?11/02/21 I&D bilateral LE, wound vac in place  ?11/03/21 I&D L5-S1, lumbar drain removed 11/06/21 ?TTE and TEE  ?PICC removed, replaced 04/08/21/21 ? ?Consultants:  ?Infectious disease ?Orthopedics  ?Neurosurgery  ? ? ? ?SUBJECTIVE:  ?Pt seen and examined at bedside, resting comfortably, parents present. Patient is feeling more eager to go home today but she is worried about her safety if her husband isn't available to help her. She lets me know he  is out of town and will be home tomorrow.  ? ? ? ? ? ? ? ? ?ASSESSMENT & PLAN ? ?Back pain ?OM (osteomyelitis) ([place]) ?Discitis ?Polyarthritis ?MRSA bacteremia ?Initially cultures were positive on 3/12 until 3/15 thereafter became negative and discharged home on daptomycin.   ?Readmitted on 4/18 due to positive bacteremia. Currently on IV vancomycin and ceftaroline followed by infectious disease. ?Underwent incision and drainage by neurosurgery on 4/21 with drain placement. Her lumbar drain was removed by neurosurgery 4/24. ?Status post incision and drainage by orthopedic of her bilateral lower extremity on 4/20.  Managed by orthopedic ?Pain control ?TTE and TEE, no vegetations  ?Blood cultures 4/22-NGTD ?PICC Removed and replaced ?Home antibiotics plan in place per ID  ?Home health in place for PICC and wound vac maintenance ? ? ?Anemia ?Depression ?controlled ? ?Orthostatic hypotension ?Encourage increased po hydration, can liberalize salt intake  ?Main barrier to discharge at this point given substantial orthostatic vital signs and symptoms, concern for high readmit/fall risk if she were to go home.  Have asked PT to reassess.  Started midodrine today.  A.m. cortisol pending. Echo as above WNL function  ? ?Constipation ?Several enemas w/ minimal effect ?Continue MiraLax  ?Added Senna and Lactulose  ?Pt encouraged to walk and hydrate!  ? ?  ?VTE Ppx: heparin sq ?CODE STATUS: FULL ?Admitted from: home ?Expected Dispo: home w/ home health PT ?Barriers to discharge: lack of help at home today - given high fall risk and risk of readmission, I am ok to hold discharge until her husband is available - she states there should be no problem to going home tomorrow and  her parents may also be able to step in to help. If no new events, plan to d/c home first thing tomorrow AM ?Family communication: parents at bedside ? ? ? ? ? ? ? ? ? ? ? ? ? ?Objective: ?Vital signs in last 24 hours: ?Temp:  [98.3 ?F (36.8 ?C)-98.8 ?F (37.1  ?C)] 98.8 ?F (37.1 ?C) (04/29 [id number]) ?Pulse Rate:  [78-93] 78 (04/29 0738) ?Resp:  [17-20] 17 (04/29 [id number]) ?BP: (128-144)/(71-84) 128/71 (04/29 [id number]) ?SpO2:  [98 %-99 %] 99 % (04/29 0738) ?Weight change:  ?Last BM Date : 11/10/21 ? ?Intake/Output from previous day: ?04/28 0701 - 04/29 0700 ?In: 65 [IV Piggyback:66] ?Out: -  ?Intake/Output this shift: ?No intake/output data recorded. ? ?Constitutional:  ?VSS, see nurse notes ?General Appearance: alert, well-developed, well-nourished, NAD ?Eyes: ?Normal lids and conjunctive, non-icteric sclera ?Ears, Nose, Mouth, Throat: ?Normal appearance ?Neck: ?No masses, trachea midline ?Respiratory: ?Normal respiratory effort ?Breath sounds normal, no wheeze/rhonchi/rales ?Cardiovascular: ?S1/S2 normal, no murmur/rub/gallop auscultated ?No lower extremity edema ?Gastrointestinal: ?Nontender, no masses ?Bowel sounds normal  ?Musculoskeletal:  ?No clubbing/cyanosis of digits ?Neurological: ?No cranial nerve deficit on limited exam ?Psychiatric: ?Normal judgment/insight ?Normal mood and affect ? ? ?Lab Results: ?Recent Labs  ?  11/10/21 ?0530 11/11/21 ?0458  ?WBC 6.4 9.8  ?HGB 9.5* 10.3*  ?HCT 31.6* 33.7*  ?PLT 362 371  ? ? ?BMET ?Recent Labs  ?  11/10/21 ?0530 11/11/21 ?0458  ?NA 137 137  ?K 3.7 3.5  ?CL 103 103  ?CO2 26 25  ?GLUCOSE 112* 131*  ?BUN 11 13  ?CREATININE 0.68 0.71  ?CALCIUM 9.1 9.2  ? ? ? ?Studies/Results: ?No results found. ? ?Medications: I have reviewed the patient's current medications. ?Scheduled: ? Chlorhexidine Gluconate Cloth  6 each Topical Daily  ? docusate sodium  100 mg Oral BID  ? feeding supplement  237 mL Oral BID BM  ? ferrous sulfate  325 mg Oral Q breakfast  ? heparin injection (subcutaneous)  5,000 Units Subcutaneous Q8H  ? lactulose  20 g Oral BID  ? metroNIDAZOLE  500 mg Oral BID  ? midodrine  5 mg Oral BID WC  ? nicotine  7 mg Transdermal Daily  ? polyethylene glycol  17 g Oral BID  ? senna-docusate  3 tablet Oral BID  ? sertraline  25 mg Oral  Daily  ? sodium chloride flush  10-40 mL Intracatheter Q12H  ? sulfamethoxazole-trimethoprim  2 tablet Oral Q12H  ? ?Continuous: ? DAPTOmycin (CUBICIN)  IV Stopped (11/10/21 1924)  ? methocarbamol (ROBAXIN) IV    ? ?[id number], cyclobenzaprine, hydrALAZINE, ibuprofen, ipratropium-albuterol, lidocaine, methocarbamol **OR** methocarbamol (ROBAXIN) IV, metoCLOPramide **OR** metoCLOPramide (REGLAN) injection, metoprolol tartrate, morphine injection, ondansetron **OR** ondansetron (ZOFRAN) IV, oxyCODONE, polyethylene glycol, senna-docusate, sodium chloride flush, traZODone, zolpidem ?Anti-infectives (From admission, onward)  ? ? Start     Dose/Rate Route Frequency Ordered Stop  ? 11/10/21 1000  sulfamethoxazole-trimethoprim (BACTRIM DS) 800-160 MG per tablet 2 tablet       ? 2 tablet Oral Every 12 hours 11/09/21 1634 11/24/21 0959  ? 11/10/21 0800  DAPTOmycin (CUBICIN) 800 mg in sodium chloride 0.9 % IVPB       ? 10 mg/kg ? 82.1 kg ?132 mL/hr over 30 Minutes Intravenous Daily 11/09/21 1634    ? 11/09/21 1000  metroNIDAZOLE (FLAGYL) tablet 500 mg       ? 500 mg Oral 2 times daily 11/09/21 0904 11/23/21 0959  ? 11/08/21 0600  vancomycin (VANCOCIN) IVPB 1000 mg/200 mL premix       ?  1,000 mg ?200 mL/hr over 60 Minutes Intravenous Every 12 hours 11/07/21 1435 11/09/21 1852  ? 11/04/21 2200  vancomycin (VANCOCIN) IVPB 1000 mg/200 mL premix  Status:  Discontinued       ? 1,000 mg ?200 mL/hr over 60 Minutes Intravenous Every 12 hours 11/04/21 1131 11/04/21 1131  ? 11/04/21 1230  vancomycin (VANCOREADY) IVPB 1500 mg/300 mL  Status:  Discontinued       ? 1,500 mg ?150 mL/hr over 120 Minutes Intravenous Every 12 hours 11/04/21 1131 11/04/21 1141  ? 11/04/21 1230  Vancomycin (VANCOCIN) 1,500 mg in sodium chloride 0.9 % 500 mL IVPB       ? 1,500 mg ?250 mL/hr over 120 Minutes Intravenous Every 12 hours 11/04/21 1141 11/07/21 1420  ? 11/03/21 1100  Vancomycin (VANCOCIN) 1,500 mg in sodium chloride 0.9 % 500 mL IVPB  Status:   Discontinued       ? 1,500 mg ?250 mL/hr over 120 Minutes Intravenous Every 12 hours 11/03/21 1006 11/04/21 1131  ? 11/03/21 0900  vancomycin (VANCOREADY) IVPB 1500 mg/300 mL  Status:  Discontinued       ?

## 2021-11-11 NOTE — Care Management (Deleted)
Pre-rounds chart review and preliminary plan / goals for today: ?Significant overnight events per chart: none ?Significant new findings on labs/other diagnostics:  ?Orthostatic hypotension, pt unable to tolerate standing  ?Dispo plan:  ?Home w/ HH was the plan, pt and family have been resistant to discharge d/t significant orthostatic hypotension/tachycardia/dizziness ?Pending/Plan:  ?Will assess if adding midodrine yesterday improved pt's symptoms.  ?AM cortisol level still pending from yesterday  ?PT to see today (?weekend coverage?) ? ?Please feel free to reach out via secure chat in Epic for non-urgent issues. Please page for urgent matters! ? ?This note will be updated after rounds to full progress note / discharge note as appropriate ? ? ? ? ? ? ? ? ? ?04/28 10:00: Supine 137/76, sitting 106/63, standing 88/50 (unable to tolerate standing 3 min and had to sit after 2 min).   ?  11/11/21 0738  ?Orthostatic Lying   ?BP- Lying 128/71  ?Pulse- Lying 78  ?Orthostatic Sitting  ?BP- Sitting 100/66  ?Pulse- Sitting 98  ?Orthostatic Standing at 0 minutes  ?BP- Standing at 0 minutes (!) 82/52  ?Pulse- Standing at 0 minutes 120  ?Orthostatic Standing at 3 minutes  ?BP- Standing at 3 minutes   ?(Patient unable to tolerate)  ?  ?

## 2021-11-11 NOTE — Progress Notes (Signed)
?   11/11/21 0738  ?Orthostatic Lying   ?BP- Lying 128/71  ?Pulse- Lying 78  ?Orthostatic Sitting  ?BP- Sitting 100/66  ?Pulse- Sitting 98  ?Orthostatic Standing at 0 minutes  ?BP- Standing at 0 minutes (!) 82/52  ?Pulse- Standing at 0 minutes 120  ?Orthostatic Standing at 3 minutes  ?BP- Standing at 3 minutes  ?(Patient unable to tolerate)  ? ? ?

## 2021-11-12 LAB — CULTURE, BLOOD (ROUTINE X 2)

## 2021-11-12 LAB — CBC
HCT: 33.1 % — ABNORMAL LOW (ref 36.0–46.0)
Hemoglobin: 10.1 g/dL — ABNORMAL LOW (ref 12.0–15.0)
MCH: 25.4 pg — ABNORMAL LOW (ref 26.0–34.0)
MCHC: 30.5 g/dL (ref 30.0–36.0)
MCV: 83.2 fL (ref 80.0–100.0)
Platelets: 353 10*3/uL (ref 150–400)
RBC: 3.98 MIL/uL (ref 3.87–5.11)
RDW: 15.3 % (ref 11.5–15.5)
WBC: 8.1 10*3/uL (ref 4.0–10.5)
nRBC: 0 % (ref 0.0–0.2)

## 2021-11-12 LAB — MAGNESIUM: Magnesium: 2.1 mg/dL (ref 1.7–2.4)

## 2021-11-12 MED ORDER — DOCUSATE SODIUM 100 MG PO CAPS
100.0000 mg | ORAL_CAPSULE | Freq: Two times a day (BID) | ORAL | 0 refills | Status: AC
Start: 2021-11-12 — End: ?

## 2021-11-12 MED ORDER — TRAZODONE HCL 50 MG PO TABS: 50.0000 mg | ORAL_TABLET | Freq: Every evening | ORAL | 0 refills | Status: AC | PRN

## 2021-11-12 MED ORDER — METHOCARBAMOL 500 MG PO TABS: 500.0000 mg | ORAL_TABLET | Freq: Four times a day (QID) | ORAL | 0 refills | Status: AC | PRN

## 2021-11-12 MED ORDER — SODIUM CHLORIDE 0.9 % IV SOLN: 10.0000 mg/kg | Freq: Every day | INTRAVENOUS | Status: DC

## 2021-11-12 MED ORDER — OXYCODONE HCL 5 MG PO TABS: 5.0000 mg | ORAL_TABLET | ORAL | 0 refills | Status: AC | PRN

## 2021-11-12 MED ORDER — METRONIDAZOLE 500 MG PO TABS
500.0000 mg | ORAL_TABLET | Freq: Two times a day (BID) | ORAL | 0 refills | Status: AC
Start: 2021-11-12 — End: 2021-12-07

## 2021-11-12 MED ORDER — POLYETHYLENE GLYCOL 3350 17 G PO PACK: 17.0000 g | PACK | Freq: Every day | ORAL | 0 refills | Status: AC | PRN

## 2021-11-12 MED ORDER — SULFAMETHOXAZOLE-TRIMETHOPRIM 800-160 MG PO TABS
2.0000 | ORAL_TABLET | Freq: Two times a day (BID) | ORAL | 0 refills | Status: AC
Start: 2021-11-12 — End: 2021-11-25

## 2021-11-12 MED ORDER — DAPTOMYCIN IV (FOR PTA / DISCHARGE USE ONLY): 800.0000 mg | INTRAVENOUS | 0 refills | Status: DC

## 2021-11-12 MED ORDER — SENNOSIDES-DOCUSATE SODIUM 8.6-50 MG PO TABS: 1.0000 | ORAL_TABLET | Freq: Every evening | ORAL | 0 refills | Status: AC | PRN

## 2021-11-12 MED ORDER — ONDANSETRON HCL 4 MG PO TABS
4.0000 mg | ORAL_TABLET | Freq: Four times a day (QID) | ORAL | 0 refills | Status: AC | PRN
Start: 2021-11-12 — End: ?

## 2021-11-12 NOTE — TOC Transition Note (Signed)
Transition of Care (TOC) - CM/SW Discharge Note ? ? ?Patient Details  ?Name: Rylinn Linzy ?MRN: [id number] ?Date of Birth: 05-15-1976 ? ?Transition of Care (TOC) CM/SW Contact:  ?Bryen Hinderman E Jessejames Steelman, LCSW ?Phone Number: ?11/12/2021, 9:41 AM ? ? ?Clinical Narrative:   Patient to DC home this morning. Pam with [facility] and Jason with [facility] notified.  ? ? ? ?Final next level of care: Home w Home Health Services ?Barriers to Discharge: Barriers Resolved ? ? ?Patient Goals and CMS Choice ?  ?  ?  ? ?Discharge Placement ?  ?           ?  ?  ?  ?  ? ?Discharge Plan and Services ?  ?  ?           ?DME Arranged: IV pump/equipment ?DME Agency: Other - Comment ([facility]) ?Date DME Agency Contacted: 11/11/21 ?  ?Representative spoke with at DME Agency: Pam ?HH Arranged: RN ?HH Agency: [facility] ?Date HH Agency Contacted: 11/11/21 ?  ?Representative spoke with at [facility] Agency: Barbara Cower ? ?Social Determinants of Health (SDOH) Interventions ?  ? ? ?Readmission Risk Interventions ?   ? View : No data to display.  ?  ?  ?  ? ? ? ? ? ?

## 2021-11-13 ENCOUNTER — Telehealth: Payer: Self-pay

## 2021-11-13 NOTE — Telephone Encounter (Signed)
Spoke with patient's spouse, Jeneen Rinks, who requested [facility] fax number in order for insurance company to fax documents. Fax number provided. ? ?Jeneen Rinks stated the insurance company will need a response by next Monday and that he will follow up tomorrow to ensure receipt of documents. ? ?Binnie Kand, RN  ?

## 2021-11-13 NOTE — Discharge Summary (Signed)
?Physician Discharge Summary ?  ?Patient: Jill Cox MRN: [id number] DOB: 11/03/1975  ?Admit date:     10/31/2021  ?Discharge date: 11/13/21  ?Discharge Physician: Emeterio Reeve  ? ?PCP: Bubba Camp, [place]  ? ?Recommendations at discharge:  ?Home health in place for wound vac and IV abx, PT.  ?Follow w/ surgery as instructed  ?PCP to monitor orthoststic hypotension, can continue midodrine vs reduce/discontinue if improves, would repeat labs CBC CMP in 1-2 weeks  ? ?Discharge Diagnoses: ?Principal Problem: ?  Back pain ?Active Problems: ?  OM (osteomyelitis) ([place]) ?  Discitis ?  Polyarthritis ?  MRSA bacteremia ?  Anemia ?  Depression ?  Orthostatic hypotension ? ?Resolved Problems: ?  * No resolved hospital problems. * ? 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 Admitted due to worsening back pain and suspected not relieving epidural abscess. Patient grew MRSA again.   ?underwent incision and drainage by orthopedic on 4/20 for bilateral lower extremity abscess.  ?underwent I&D of L5-S1 on 4/21 by neurosurgery ?Echocardiogram was negative, TEE showed no endocarditis  ?ID following, was awaiting culture/MIC which was available 11/09/21, need to keep patient overnight to confirm home antibiotics can be available.  ?Was hoping to d/c home 11/10/21 however significant orthostatic hypotension presents fall risk, risk for readmission. AM cortisol level pending, started midodrine. 11/11/21 pt reports improvement in dizziness but not resolution. 04/30 was able to discharge home in AM  ? ?Assessment and Plan: ?* Back pain ?Marland KitchenDiscitis ?.OM (osteomyelitis) ([place]) ?.Polyarthritis ?Marland KitchenMRSA bacteremia ?Initially cultures were positive on 3/12 until 3/15 thereafter became negative and discharged home on daptomycin.   ?Readmitted on 4/18 due to positive bacteremia. Currently on IV vancomycin and ceftaroline  followed by infectious disease. ?Underwent incision and drainage by neurosurgery on 4/21 with drain placement. Her lumbar drain was removed by neurosurgery 4/24. ?Status post incision and drainage by orthopedic of her bilateral lower extremity on 4/20.  Managed by orthopedic ?Pain control ?TTE and TEE, no vegetations  ?Blood cultures 4/22-NGTD ?PICC in place, IV abx on discharge per ID: On Dapto  ( 5 weeks) and bactrim  ( 2 weeks) -Will also get bactrim DS 2 BID  to treat proteus  X 2 weeks. Flagyl 547m BID for bacteroides in the abscess X 2 weeks ? ?Anemia ?.Depression ?Remained stable  ? ?Orthostatic hypotension ?Started midodrine.  A.m. cortisol was normal .   ? ? ?  ? ?Pain control - [place]. and patient was instructed, not to drive, operate heavy machinery, perform activities at heights, swimming or participation in water activities or provide baby-sitting services while on Pain, Sleep and Anxiety Medications; until their outpatient Physician has advised to do so again. Also recommended to not to take more than prescribed Pain, Sleep and Anxiety Medications.  ?Consultants:  ?Infectious disease ?Orthopedics  ?Neurosurgery  ? ?Procedures performed:  ?11/02/21 I&D bilateral LE, wound vac in place  ?11/03/21 I&D L5-S1, lumbar drain removed 11/06/21 ?TTE and TEE  ?PICC removed, replaced 04/08/21/21 ?  ?Disposition: Home health ?Diet recommendation:  ?Discharge Diet Orders (From admission, onward)  ? ?  Start     Ordered  ? 11/12/21 0000  Diet - low sodium heart healthy       ? 11/12/21 0820  ? ?  ?  ? ?  ? ?Regular diet ?DISCHARGE MEDICATION: ?Allergies as of 11/12/2021   ?  No Known Allergies ?  ? ?  ?Medication List  ?  ? ?STOP taking these medications   ? ?DAPTOmycin in sodium chloride 0.9 % 50 mL ?Replaced by: DAPTOmycin 800 mg in sodium chloride 0.9 % 50 mL ?  ?oxyCODONE-acetaminophen 5-325 MG tablet ?Commonly known as: PERCOCET/ROXICET ?   ?potassium chloride SA 20 MEQ tablet ?Commonly known as: KLOR-CON M ?  ? ?  ? ?TAKE these medications   ? ?acetaminophen 325 MG tablet ?Commonly known as: TYLENOL ?Take 2 tablets (650 mg total) by mouth every 6 (six) hours as needed for mild pain or fever. ?  ?cyclobenzaprine 10 MG tablet ?Commonly known as: FLEXERIL ?Take 1 tablet (10 mg total) by mouth 3 (three) times daily as needed for up to 20 days for muscle spasms. ?  ?daptomycin  IVPB ?Commonly known as: CUBICIN ?Inject 800 mg into the vein daily. Indication:  MRSA bacteremia with discitis and epidural abscess ?First Dose: Yes ?Last Day of Therapy:  12/14/2021 ?Labs - Once weekly:  CBC/D, CMP, CRP, ESR and CPK ?Please pull PIC at completion of IV antibiotics ?Fax weekly labs to [phone] ?Method of administration: IV Push ?Method of administration may be changed at the discretion of home infusion pharmacist based upon assessment of the patient and/or caregiver's ability to self-administer the medication ordered. ?What changed:  ?how much to take ?additional instructions ?  ?DAPTOmycin 800 mg in sodium chloride 0.9 % 50 mL ?Inject 800 mg into the vein daily at 8 pm. ?Replaces: DAPTOmycin in sodium chloride 0.9 % 50 mL ?  ?docusate sodium 100 MG capsule ?Commonly known as: COLACE ?Take 1 capsule (100 mg total) by mouth 2 (two) times daily. ?  ?feeding supplement Liqd ?Take 237 mLs by mouth 2 (two) times daily between meals. ?  ?ferrous sulfate 325 (65 FE) MG tablet ?Take 1 tablet (325 mg total) by mouth daily with breakfast. ?  ?lidocaine 5 % ointment ?Commonly known as: XYLOCAINE ?Apply 1 application. topically 4 (four) times daily as needed. ?  ?methocarbamol 500 MG tablet ?Commonly known as: ROBAXIN ?Take 1 tablet (500 mg total) by mouth every 6 (six) hours as needed for muscle spasms. ?  ?metroNIDAZOLE 500 MG tablet ?Commonly known as: FLAGYL ?Take 1 tablet (500 mg total) by mouth 2 (two) times daily for 25 days. ?  ?multivitamin with minerals Tabs  tablet ?Take 1 tablet by mouth daily. ?  ?naproxen 500 MG tablet ?Commonly known as: NAPROSYN ?Take 500 mg by mouth 2 (two) times daily as needed. ?  ?ondansetron 4 MG tablet ?Commonly known as: ZOFRAN ?Take 1 tablet (4 mg total) by mouth every 6 (six) hours as needed for nausea. ?  ?oxyCODONE 5 MG immediate release tablet ?Commonly known as: Oxy IR/ROXICODONE ?Take 1 tablet (5 mg total) by mouth every 4 (four) hours as needed for severe pain or moderate pain. ?  ?polyethylene glycol 17 g packet ?Commonly known as: MIRALAX / GLYCOLAX ?Take 17 g by mouth daily as needed for moderate constipation or mild constipation. ?  ?senna-docusate 8.6-50 MG tablet ?Commonly known as: Senokot-S ?Take 1 tablet by mouth at bedtime as needed for moderate constipation. ?  ?sertraline 25 MG tablet ?Commonly known as: ZOLOFT ?Take 25 mg by mouth every morning. ?  ?sulfamethoxazole-trimethoprim 800-160 MG tablet ?Commonly known as: BACTRIM DS ?Take 2 tablets by mouth every 12 (twelve) hours for 26 doses. ?  ?traZODone 50 MG tablet ?Commonly known as: DESYREL ?Take 1 tablet (50 mg total) by mouth at  bedtime as needed for sleep. ?  ? ?  ? ?  ?  ? ? ?  ?Discharge Care Instructions  ?(From admission, onward)  ?  ? ? ?  ? ?  Start     Ordered  ? 11/12/21 0000  Change dressing on IV access line weekly and PRN  (Home infusion instructions - [facility] )       ? 11/12/21 0820  ? 11/12/21 0000  Discharge wound care:       ?Comments: Wound vac care per protocol. Other incisions keep clean/dry and change dressings daily / twice per day  ? 11/12/21 0820  ? ?  ?  ? ?  ? ? Follow-up Information   ? ? Loleta Dicker, PA Follow up in 2 week(s).   ?Why: for incision check. Please call the office for appointment date and time ?Contact information: ?[place] [address] ?[phone] ? ? ?  ?  ? ?  ?  ? ?  ? ?Discharge Exam: ?Danley Danker Weights  ? 11/01/21 1815  ?Weight: 82.1 kg  ? ?Constitutional:  ?VSS, see nurse  notes ?General Appearance: alert, well-developed, well-nourished, NAD ?Eyes: ?Normal lids and conjunctive, non-icteric sclera ?Ears, Nose, Mouth, Throat: ?Normal appearance ?Neck: ?No masses, trachea midline ?Respiratory: ?No

## 2021-11-14 ENCOUNTER — Ambulatory Visit: Admitting: Internal Medicine

## 2021-11-14 ENCOUNTER — Encounter: Payer: 59 | Admitting: Physical Therapy

## 2021-11-14 NOTE — Telephone Encounter (Signed)
Called patient's husband regarding FMLA paperwork. This will need to be completed by PCP.  Relayed this to [place] who verbalized understanding. ?Leatrice Jewels, RMA  ?

## 2021-11-16 ENCOUNTER — Encounter: Payer: 59 | Admitting: Physical Therapy

## 2021-11-20 ENCOUNTER — Encounter: Payer: 59 | Attending: Internal Medicine | Admitting: Internal Medicine

## 2021-11-20 DIAGNOSIS — A4102 Sepsis due to Methicillin resistant Staphylococcus aureus: Secondary | ICD-10-CM | POA: Diagnosis not present

## 2021-11-20 DIAGNOSIS — L97218 Non-pressure chronic ulcer of right calf with other specified severity: Secondary | ICD-10-CM | POA: Diagnosis not present

## 2021-11-20 DIAGNOSIS — L97128 Non-pressure chronic ulcer of left thigh with other specified severity: Secondary | ICD-10-CM | POA: Insufficient documentation

## 2021-11-20 NOTE — Progress Notes (Signed)
Jill Cox, Jill Cox ([id number]) ?Visit Report for 11/20/2021 ?Abuse Risk Screen Details ?Patient Name: Jill Cox, Jill Cox ?Date of Service: 11/20/2021 12:45 PM ?Medical Record Number: [id number] ?Patient Account Number: [id number] ?Date of Birth/Sex: Apr 05, 1976 (46 y.o. Female) ?Treating RN: Cornell Barman ?Primary Care Hagan Vanauken: Bubba Camp Other Clinician: ?Referring Asahel Risden: Hessie Knows ?Treating Timmothy Baranowski/Extender: Ricard Dillon ?Weeks in Treatment: 0 ?Abuse Risk Screen Items ?Answer ?ABUSE RISK SCREEN: ?Has anyone close to you tried to hurt or harm you recentlyo No ?Do you feel uncomfortable with anyone in your familyo No ?Has anyone forced you do things that you didnot want to doo No ?Electronic Signature(s) ?Signed: 11/20/2021 4:28:03 PM By: Gretta Cool, BSN, RN, CWS, Kim RN, BSN ?Entered By: Gretta Cool, BSN, RN, CWS, Kim on 11/20/2021 13:04:45 ?Jill Cox, Jill Cox ([id number]) ?-------------------------------------------------------------------------------- ?Activities of Daily Living Details ?Patient Name: Jill Cox, Jill Cox ?Date of Service: 11/20/2021 12:45 PM ?Medical Record Number: [id number] ?Patient Account Number: [id number] ?Date of Birth/Sex: 1975-08-04 (46 y.o. Female) ?Treating RN: Cornell Barman ?Primary Care Derra Shartzer: Bubba Camp Other Clinician: ?Referring Letanya Froh: Hessie Knows ?Treating Corrie Reder/Extender: Ricard Dillon ?Weeks in Treatment: 0 ?Activities of Daily Living Items ?Answer ?Activities of Daily Living (Please select one for each item) ?Drive Automobile Need Assistance ?Take Medications Completely Able ?Use Telephone Completely Able ?Care for Appearance Need Assistance ?Use Toilet Need Assistance ?Bath / Shower Need Assistance ?Dress Self Need Assistance ?Feed Self Need Assistance ?Walk Completely Able ?Get In / Out Bed Need Assistance ?Housework Need Assistance ?Prepare Meals Need Assistance ?Handle Money Need Assistance ?Shop for Self Need Assistance ?Electronic Signature(s) ?Signed: 11/20/2021 4:28:03 PM By: Gretta Cool,  BSN, RN, CWS, Kim RN, BSN ?Entered By: Gretta Cool, BSN, RN, CWS, Kim on 11/20/2021 13:05:09 ?Jill Cox, Jill Cox ([id number]) ?-------------------------------------------------------------------------------- ?Education Screening Details ?Patient Name: Jill Cox, Jill Cox ?Date of Service: 11/20/2021 12:45 PM ?Medical Record Number: [id number] ?Patient Account Number: [id number] ?Date of Birth/Sex: 25-May-1976 (46 y.o. Female) ?Treating RN: Cornell Barman ?Primary Care Tjay Velazquez: Bubba Camp Other Clinician: ?Referring Sultan Pargas: Hessie Knows ?Treating Tyriana Helmkamp/Extender: Ricard Dillon ?Weeks in Treatment: 0 ?Primary Learner Assessed: Patient ?Learning Preferences/Education Level/Primary Language ?Learning Preference: Explanation, Demonstration ?Highest Education Level: College or Above ?Preferred Language: English ?Cognitive Barrier ?Language Barrier: No ?Translator Needed: No ?Memory Deficit: No ?Emotional Barrier: No ?Cultural/Religious Beliefs Affecting Medical Care: No ?Physical Barrier ?Impaired Vision: Yes Glasses ?Impaired Hearing: No ?Decreased Hand dexterity: No ?Knowledge/Comprehension ?Knowledge Level: High ?Comprehension Level: High ?Ability to understand written instructions: High ?Ability to understand verbal instructions: High ?Motivation ?Anxiety Level: Calm ?Cooperation: Cooperative ?Education Importance: Acknowledges Need ?Interest in Health Problems: Asks Questions ?Perception: Coherent ?Willingness to Engage in Self-Management ?High ?Activities: ?Readiness to Engage in Self-Management ?High ?Activities: ?Electronic Signature(s) ?Signed: 11/20/2021 4:28:03 PM By: Gretta Cool, BSN, RN, CWS, Kim RN, BSN ?Entered By: Gretta Cool, BSN, RN, CWS, Kim on 11/20/2021 13:05:41 ?Jill Cox, Jill Cox ([id number]) ?-------------------------------------------------------------------------------- ?Fall Risk Assessment Details ?Patient Name: Jill Cox, Jill Cox ?Date of Service: 11/20/2021 12:45 PM ?Medical Record Number: [id number] ?Patient Account Number:  [id number] ?Date of Birth/Sex: 28-May-1976 (46 y.o. Female) ?Treating RN: Cornell Barman ?Primary Care Avonte Sensabaugh: Bubba Camp Other Clinician: ?Referring Loyed Wilmes: Hessie Knows ?Treating Kainen Struckman/Extender: Ricard Dillon ?Weeks in Treatment: 0 ?Fall Risk Assessment Items ?Have you had 2 or more falls in the last 12 monthso 0 No ?Have you had any fall that resulted in injury in the last 12 monthso 0 No ?FALLS RISK SCREEN ?History of falling - immediate or within 3 months 25 Yes ?Secondary diagnosis (Do you have 2 or more medical diagnoseso) 0 No ?Ambulatory aid ?None/bed rest/wheelchair/nurse 0 No ?Crutches/cane/walker 15 Yes ?Furniture  0 No ?Intravenous therapy Access/Saline/Heparin Lock 0 No ?Gait/Transferring ?Normal/ bed rest/ wheelchair 0 Yes ?Weak (short steps with or without shuffle, stooped but able to lift head while walking, may ?0 No ?seek support from furniture) ?Impaired (short steps with shuffle, may have difficulty arising from chair, head down, impaired ?0 No ?balance) ?Mental Status ?Oriented to own ability 0 Yes ?Electronic Signature(s) ?Signed: 11/20/2021 4:28:03 PM By: Gretta Cool, BSN, RN, CWS, Kim RN, BSN ?Entered By: Gretta Cool, BSN, RN, CWS, Kim on 11/20/2021 13:06:04 ?Jill Cox, Jill Cox ([id number]) ?-------------------------------------------------------------------------------- ?Foot Assessment Details ?Patient Name: Jill Cox, Jill Cox ?Date of Service: 11/20/2021 12:45 PM ?Medical Record Number: [id number] ?Patient Account Number: [id number] ?Date of Birth/Sex: 1975/12/29 (46 y.o. Female) ?Treating RN: Cornell Barman ?Primary Care Jaina Morin: Bubba Camp Other Clinician: ?Referring Evian Salguero: Hessie Knows ?Treating Rosibel Giacobbe/Extender: Ricard Dillon ?Weeks in Treatment: 0 ?Foot Assessment Items ?Site Locations ?+ = Sensation present, - = Sensation absent, C = Callus, U = Ulcer ?R = Redness, W = Warmth, M = Maceration, PU = Pre-ulcerative lesion ?F = Fissure, S = Swelling, D = Dryness ?Assessment ?Right:  Left: ?Other Deformity: No No ?Prior Foot Ulcer: No No ?Prior Amputation: No No ?Charcot Joint: No No ?Ambulatory Status: Ambulatory Without Help ?Gait: Steady ?Electronic Signature(s) ?Signed: 11/20/2021 4:28:03 PM By: Gretta Cool, BSN, RN, CWS, Kim RN, BSN ?Entered By: Gretta Cool, BSN, RN, CWS, Kim on 11/20/2021 13:06:45 ?Jill Cox, Jill Cox ([id number]) ?-------------------------------------------------------------------------------- ?Nutrition Risk Screening Details ?Patient Name: Jill Cox, STORRER ?Date of Service: 11/20/2021 12:45 PM ?Medical Record Number: [id number] ?Patient Account Number: [id number] ?Date of Birth/Sex: 1975-09-02 (46 y.o. Female) ?Treating RN: Cornell Barman ?Primary Care Thais Silberstein: Bubba Camp Other Clinician: ?Referring Keylen Uzelac: Hessie Knows ?Treating Kenniel Bergsma/Extender: Ricard Dillon ?Weeks in Treatment: 0 ?Height (in): 66 ?Weight (lbs): 162 ?Body Mass Index (BMI): 26.1 ?Nutrition Risk Screening Items ?Score Screening ?NUTRITION RISK SCREEN: ?I have an illness or condition that made me change the kind and/or amount of food I eat 0 No ?I eat fewer than two meals per day 0 No ?I eat few fruits and vegetables, or milk products 0 No ?I have three or more drinks of beer, liquor or wine almost every day 0 No ?I have tooth or mouth problems that make it hard for me to eat 0 No ?I don't always have enough money to buy the food I need 0 No ?I eat alone most of the time 0 No ?I take three or more different prescribed or over-the-counter drugs a day 1 Yes ?Without wanting to, I have lost or gained 10 pounds in the last six months 0 No ?I am not always physically able to shop, cook and/or feed myself 0 No ?Nutrition Protocols ?Good Risk Protocol 0 No interventions needed ?Moderate Risk Protocol ?High Risk Proctocol ?Risk Level: Good Risk ?Score: 1 ?Electronic Signature(s) ?Signed: 11/20/2021 4:28:03 PM By: Gretta Cool, BSN, RN, CWS, Kim RN, BSN ?Entered By: Gretta Cool, BSN, RN, CWS, Kim on 11/20/2021 13:06:35 ?

## 2021-11-21 ENCOUNTER — Encounter: Payer: 59 | Admitting: Physical Therapy

## 2021-11-21 NOTE — Progress Notes (Signed)
KLYN, CHRISTAKOS ([id number]) ?Visit Report for 11/20/2021 ?Allergy List Details ?Patient Name: Jill Cox, Jill Cox ?Date of Service: 11/20/2021 12:45 PM ?Medical Record Number: [id number] ?Patient Account Number: [id number] ?Date of Birth/Sex: 1976-05-15 (46 y.o. Female) ?Treating RN: Cornell Barman ?Primary Care Lacie Landry: Bubba Camp Other Clinician: ?Referring Axton Cihlar: Hessie Knows ?Treating Maruice Pieroni/Extender: Ricard Dillon ?Weeks in Treatment: 0 ?Allergies ?Active Allergies ?No Known Drug Allergies ?Allergy Notes ?Electronic Signature(s) ?Signed: 11/20/2021 4:28:03 PM By: Gretta Cool, BSN, RN, CWS, Kim RN, BSN ?Entered By: Gretta Cool, BSN, RN, CWS, Kim on 11/20/2021 13:01:24 ?YLIANNA, ARBAIZA ([id number]) ?-------------------------------------------------------------------------------- ?Arrival Information Details ?Patient Name: Jill Cox, Jill Cox ?Date of Service: 11/20/2021 12:45 PM ?Medical Record Number: [id number] ?Patient Account Number: [id number] ?Date of Birth/Sex: 08-Aug-1975 (46 y.o. Female) ?Treating RN: Cornell Barman ?Primary Care Gaylia Kassel: Bubba Camp Other Clinician: ?Referring Mc Hollen: Hessie Knows ?Treating Gabryel Talamo/Extender: Ricard Dillon ?Weeks in Treatment: 0 ?Visit Information ?Patient Arrived: Ambulatory ?Arrival Time: 12:55 ?Accompanied By: husband, Jeneen Rinks ?Transfer Assistance: None ?Patient Identification Verified: Yes ?Secondary Verification Process Completed: Yes ?Patient Has Alerts: Yes ?Patient Alerts: NOT DIABETIC ?Electronic Signature(s) ?Signed: 11/20/2021 4:28:03 PM By: Gretta Cool, BSN, RN, CWS, Kim RN, BSN ?Entered By: Gretta Cool, BSN, RN, CWS, Kim on 11/20/2021 12:59:06 ?Jill Cox, Jill Cox ([id number]) ?-------------------------------------------------------------------------------- ?Clinic Level of Care Assessment Details ?Patient Name: Jill Cox, Jill Cox ?Date of Service: 11/20/2021 12:45 PM ?Medical Record Number: [id number] ?Patient Account Number: [id number] ?Date of Birth/Sex: 1976-02-07 (46 y.o. Female) ?Treating RN: Cornell Barman ?Primary Care Breunna Nordmann: Bubba Camp Other Clinician: ?Referring Aleatha Taite: Hessie Knows ?Treating Chasitie Passey/Extender: Ricard Dillon ?Weeks in Treatment: 0 ?Clinic Level of Care Assessment Items ?TOOL 2 Quantity Score ?[]  - Use when only an EandM is performed on the INITIAL visit 0 ?ASSESSMENTS - Nursing Assessment / Reassessment ?X - General Physical Exam (combine w/ comprehensive assessment (listed just below) when performed on new ?1 20 ?pt. evals) ?X- 1 25 ?Comprehensive Assessment (HX, ROS, Risk Assessments, Wounds Hx, etc.) ?ASSESSMENTS - Wound and Skin Assessment / Reassessment ?X - Simple Wound Assessment / Reassessment - one wound 1 5 ?X- 2 5 ?Complex Wound Assessment / Reassessment - multiple wounds ?[]  - 0 ?Dermatologic / Skin Assessment (not related to wound area) ?ASSESSMENTS - Ostomy and/or Continence Assessment and Care ?[]  - Incontinence Assessment and Management 0 ?[]  - 0 ?Ostomy Care Assessment and Management (repouching, etc.) ?PROCESS - Coordination of Care ?X - Simple Patient / Family Education for ongoing care 1 15 ?[]  - 0 ?Complex (extensive) Patient / Family Education for ongoing care ?X- 1 10 ?Staff obtains Consents, Records, Test Results / Process Orders ?[]  - 0 ?Staff telephones HHA, Nursing Homes / Clarify orders / etc ?[]  - 0 ?Routine Transfer to another Facility (non-emergent condition) ?[]  - 0 ?Routine Hospital Admission (non-emergent condition) ?X- 1 15 ?New Admissions / Biomedical engineer / Ordering NPWT, Apligraf, etc. ?[]  - 0 ?Emergency Hospital Admission (emergent condition) ?X- 1 10 ?Simple Discharge Coordination ?[]  - 0 ?Complex (extensive) Discharge Coordination ?PROCESS - Special Needs ?[]  - Pediatric / Minor Patient Management 0 ?[]  - 0 ?Isolation Patient Management ?[]  - 0 ?Hearing / Language / Visual special needs ?[]  - 0 ?Assessment of Community assistance (transportation, D/C planning, etc.) ?[]  - 0 ?Additional assistance / Altered mentation ?[]  -  0 ?Support Surface(s) Assessment (bed, cushion, seat, etc.) ?INTERVENTIONS - Wound Cleansing / Measurement ?X - Wound Imaging (photographs - any number of wounds) 1 5 ?[]  - 0 ?Wound Tracing (instead of photographs) ?[]  - 0 ?Simple Wound Measurement - one wound ?X- 2 5 ?Complex Wound Measurement -  multiple wounds ?Jill Cox, Jill Cox ([id number]) ?[]  - 0 ?Simple Wound Cleansing - one wound ?X- 2 5 ?Complex Wound Cleansing - multiple wounds ?INTERVENTIONS - Wound Dressings ?X - Small Wound Dressing one or multiple wounds 2 10 ?[]  - 0 ?Medium Wound Dressing one or multiple wounds ?[]  - 0 ?Large Wound Dressing one or multiple wounds ?[]  - 0 ?Application of Medications - injection ?INTERVENTIONS - Miscellaneous ?[]  - External ear exam 0 ?[]  - 0 ?Specimen Collection (cultures, biopsies, blood, body fluids, etc.) ?[]  - 0 ?Specimen(s) / Culture(s) sent or taken to Lab for analysis ?[]  - 0 ?Patient Transfer (multiple staff / Civil Service fast streamer / Similar devices) ?[]  - 0 ?Simple Staple / Suture removal (25 or less) ?[]  - 0 ?Complex Staple / Suture removal (26 or more) ?[]  - 0 ?Hypo / Hyperglycemic Management (close monitor of Blood Glucose) ?[]  - 0 ?Ankle / Brachial Index (ABI) - do not check if billed separately ?Has the patient been seen at the hospital within the last three years: Yes ?Total Score: 155 ?Level Of Care: New/Established - Level ?4 ?Electronic Signature(s) ?Signed: 11/20/2021 4:28:03 PM By: Gretta Cool, BSN, RN, CWS, Kim RN, BSN ?Entered By: Gretta Cool, BSN, RN, CWS, Kim on 11/20/2021 13:55:48 ?Jill Cox, Jill Cox ([id number]) ?-------------------------------------------------------------------------------- ?Encounter Discharge Information Details ?Patient Name: Jill Cox, Jill Cox ?Date of Service: 11/20/2021 12:45 PM ?Medical Record Number: [id number] ?Patient Account Number: [id number] ?Date of Birth/Sex: Feb 20, 1976 (46 y.o. Female) ?Treating RN: Cornell Barman ?Primary Care Amillya Chavira: Bubba Camp Other Clinician: ?Referring Afomia Blackley: Hessie Knows ?Treating Marzell Allemand/Extender: Ricard Dillon ?Weeks in Treatment: 0 ?Encounter Discharge Information Items ?Discharge Condition: Stable ?Ambulatory Status: Gilford Rile ?Discharge Destination: Home ?Transportation: Private Auto ?Accompanied By: husband ?Schedule Follow-up Appointment: Yes ?Clinical Summary of Care: ?Electronic Signature(s) ?Signed: 11/20/2021 4:28:03 PM By: Gretta Cool, BSN, RN, CWS, Kim RN, BSN ?Entered By: Gretta Cool, BSN, RN, CWS, Kim on 11/20/2021 13:57:20 ?Jill Cox, Jill Cox ([id number]) ?-------------------------------------------------------------------------------- ?Lower Extremity Assessment Details ?Patient Name: Jill Cox, Jill Cox ?Date of Service: 11/20/2021 12:45 PM ?Medical Record Number: [id number] ?Patient Account Number: [id number] ?Date of Birth/Sex: [birth year] (46 y.o. Female) ?Treating RN: Cornell Barman ?Primary Care Skippy Marhefka: Bubba Camp Other Clinician: ?Referring Natacha Jepsen: Hessie Knows ?Treating Kelsie Kramp/Extender: Ricard Dillon ?Weeks in Treatment: 0 ?Edema Assessment ?Assessed: [Left: No] [Right: No] ?Edema: [Left: No] [Right: No] ?Vascular Assessment ?Pulses: ?Dorsalis Pedis ?Palpable: [Left:Yes] [Right:Yes] ?Electronic Signature(s) ?Signed: 11/20/2021 4:28:03 PM By: Gretta Cool, BSN, RN, CWS, Kim RN, BSN ?Entered By: Gretta Cool, BSN, RN, CWS, Kim on 11/20/2021 13:27:30 ?Jill Cox, Jill Cox ([id number]) ?-------------------------------------------------------------------------------- ?Multi Wound Chart Details ?Patient Name: Jill Cox, Jill Cox ?Date of Service: 11/20/2021 12:45 PM ?Medical Record Number: [id number] ?Patient Account Number: [id number] ?Date of Birth/Sex: 1976-05-04 (46 y.o. Female) ?Treating RN: Cornell Barman ?Primary Care Dani Wallner: Bubba Camp Other Clinician: ?Referring Calee Nugent: Hessie Knows ?Treating Darothy Courtright/Extender: Ricard Dillon ?Weeks in Treatment: 0 ?Vital Signs ?Height(in): 66 ?Pulse(bpm): 76 ?Weight(lbs): 162 ?Blood Pressure(mmHg): 148/87 ?Body Mass Index(BMI): 26.1 ?Temperature(??F):  98.2 ?Respiratory Rate(breaths/min): 16 ?Photos: [N/A:N/A] ?Wound Location: Left, Lateral Upper Leg Right, Posterior Lower Leg N/A ?Wounding Event: Gradually Appeared Gradually Appeared N/A ?Primary Etiology: Infection - not e

## 2021-11-21 NOTE — Progress Notes (Signed)
Jill Cox ([id number]) ?Visit Report for 11/20/2021 ?Chief Complaint Document Details ?Patient Name: Jill Cox, Jill Cox ?Date of Service: 11/20/2021 12:45 PM ?Medical Record Number: [id number] ?Patient Account Number: [id number] ?Date of Birth/Sex: 05/30/1976 (46 y.o. Female) ?Treating RN: Huel Coventry ?Primary Care Provider: Cleon Dew Other Clinician: ?Referring Provider: Kennedy Bucker ?Treating Provider/Extender: Maxwell Caul ?Weeks in Treatment: 0 ?Information Obtained from: Patient ?Chief Complaint ?11/20/2021; patient is here for 2 remaining wounds 1 on the left upper anterior thigh and 1 on the right posterior calf. Both of these wounds have ?wound vacs on them and seem to be in good condition ?Electronic Signature(s) ?Signed: 11/21/2021 6:42:33 AM By: Baltazar Najjar MD ?Entered By: Baltazar Najjar on 11/20/2021 14:03:26 ?[name], [name] ([id number]) ?-------------------------------------------------------------------------------- ?HPI Details ?Patient Name: Jill Cox, Jill Cox ?Date of Service: 11/20/2021 12:45 PM ?Medical Record Number: [id number] ?Patient Account Number: [id number] ?Date of Birth/Sex: 1975-12-25 (46 y.o. Female) ?Treating RN: Huel Coventry ?Primary Care Provider: Cleon Dew Other Clinician: ?Referring Provider: Kennedy Bucker ?Treating Provider/Extender: Maxwell Caul ?Weeks in Treatment: 0 ?History of Present Illness ?HPI Description: IandDADMIT ?11/20/2021 ?This is a 46 year old healthy woman who presented to the hospital in March with right shoulder pain for which she had received steroid injections. ?She was admitted from 3/12 through 3/14 with positive MRSA joint pain and sepsis. TEE was negative. Multiple joints were involved. 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 She required use of her right thigh left ?forearm right calf. On 3/20 she required IandD's of abscesses on the left thigh and right calf aspiration of  both knees and left knee and right ankle. 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 This was debrided by ?neurosurgery. At that point was put on daptomycin and Bactrim ?She is currently on Bactrim and Flagyl and daptomycin. She has 2 wounds 1 on the left lateral thigh 1 on the right posterior calf both of these look ?reasonably healthy. She is using a wound VAC changed by home health minimal drainage. They are here for a second opinion about condition of ?the wounds. They follow-up with their surgeons in a week's time. Using a wound VAC to both wound areas ?The patient's family member is a PA in [place]. [place] apparently at a wound care center ?Electronic Signature(s) ?Signed: 11/21/2021 6:42:33 AM By: Baltazar Najjar MD ?Entered By: Baltazar Najjar on 11/20/2021 14:07:10 ?[name], [name] ([id number]) ?-------------------------------------------------------------------------------- ?Physical Exam Details ?Patient Name: Jill Cox, Jill Cox ?Date of Service: 11/20/2021 12:45 PM ?Medical Record Number: [id number] ?Patient Account Number: [id number] ?Date of Birth/Sex: 02-Aug-1975 (46 y.o. Female) ?Treating RN: Huel Coventry ?Primary Care Provider: Cleon Dew Other Clinician: ?Referring Provider: Kennedy Bucker ?Treating Provider/Extender: Maxwell Caul ?Weeks in Treatment: 0 ?Constitutional ?Patient is hypertensive.. Pulse regular and within target range for patient.Marland Kitchen Respirations regular, non-labored and within target range.Marland Kitchen ?Temperature is normal and within the target range for the patient.Marland Kitchen appears in no distress. Does not appear systemically unwell. ?Cardiovascular ?Pedal pulses are palpable on the left. ?Notes ?Wound exam; left lateral thigh clean wound minimal undermining. No debridement was required. Similar condition of the larger wound on the ?right posterior calf. Again some minimal undermining distally. Under illumination there was no need  for debridement. No surrounding erythema ?Electronic Signature(s) ?Signed: 11/21/2021 6:42:33 AM By: Baltazar Najjar MD ?Entered By: Baltazar Najjar on 11/20/2021 14:08:46 ?[name], [name] ([id number]) ?-------------------------------------------------------------------------------- ?Physician Orders Details ?Patient Name: Jill Cox, Jill Cox ?  Date of Service: 11/20/2021 12:45 PM ?Medical Record Number: [id number] ?Patient Account Number: [id number] ?Date of Birth/Sex: Jul 25, 1975 (46 y.o. Female) ?Treating RN: Huel Coventry ?Primary Care Provider: Cleon Dew Other Clinician: ?Referring Provider: Kennedy Bucker ?Treating Provider/Extender: Maxwell Caul ?Weeks in Treatment: 0 ?Verbal / Phone Orders: No ?Diagnosis Coding ?Follow-up Appointments ?Wound #1 Left,Lateral Upper Leg ?o Return Appointment in 1 week. ?Wound #2 Right,Posterior Lower Leg ?o Return Appointment in 1 week. ?Home Health ?o CONTINUE Home Health for wound care. May utilize formulary equivalent dressing for wound treatment orders unless ?otherwise specified. Home Health Nurse may visit PRN to address patientos wound care needs. - [facility] ?o Scheduled days for dressing changes to be completed; exception, patient has scheduled wound care visit that day. ?o **Please direct any NON-WOUND related issues/requests for orders to patient's Primary Care Physician. **If current ?dressing causes regression in wound condition, may D/C ordered dressing product/s and apply Normal Saline Moist ?Dressing daily until next Wound Healing Center or Other MD appointment. **Notify Wound Healing Center of regression in ?wound condition at [phone]. ?Bathing/ Shower/ Hygiene ?o May shower; gently cleanse wound with antibacterial soap, rinse and pat dry prior to dressing wounds ?Negative Pressure Wound Therapy ?o Wound VAC settings at continuous pressure. Use foam to wound cavity. Please order WHITE foam to fill any ?tunnel/s and/or undermining when  necessary. Change VAC dressing 2 X WEEK. Change canister as indicated when full. - ?Black foam only ?o Home Health Nurse may d/c VAC for s/s of increased infection, significant wound regression, or uncontrolled drainage. ?Notify Wound Healing Center at [phone]. ?o Number of foam/gauze pieces used in the dressing = ?Wound Treatment ?Electronic Signature(s) ?Signed: 11/20/2021 4:28:03 PM By: Elliot Gurney, BSN, RN, CWS, Kim RN, BSN ?Signed: 11/21/2021 6:42:33 AM By: Baltazar Najjar MD ?Entered By: Elliot Gurney, BSN, RN, CWS, Kim on 11/20/2021 13:52:04 ?Jill Cox, Jill Cox ([id number]) ?-------------------------------------------------------------------------------- ?Problem List Details ?Patient Name: Jill Cox, Jill Cox ?Date of Service: 11/20/2021 12:45 PM ?Medical Record Number: [id number] ?Patient Account Number: [id number] ?Date of Birth/Sex: 1976-04-22 (46 y.o. 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 Female) ?Treating RN: Huel Coventry ?Primary Care Provider: Cleon Dew  Other Clinician: ?Referring Provider: Kennedy Bucker ?Treating Provider/Extender: Maxwell Caul ?Weeks in Treatment: 0 ?Subjective ?Chief Complaint ?Information obtained from Patient ?11/20/2021; patient is he

## 2021-11-23 ENCOUNTER — Encounter: Payer: 59 | Admitting: Physical Therapy

## 2021-11-28 ENCOUNTER — Ambulatory Visit: Payer: 59 | Attending: Infectious Diseases | Admitting: Infectious Diseases

## 2021-11-28 ENCOUNTER — Encounter: Payer: Self-pay | Admitting: Infectious Diseases

## 2021-11-28 ENCOUNTER — Encounter: Payer: 59 | Admitting: Physical Therapy

## 2021-11-28 VITALS — BP 126/86 | HR 77 | Temp 96.6°F | Ht 66.0 in | Wt 165.0 lb

## 2021-11-28 DIAGNOSIS — B964 Proteus (mirabilis) (morganii) as the cause of diseases classified elsewhere: Secondary | ICD-10-CM | POA: Diagnosis not present

## 2021-11-28 DIAGNOSIS — A4902 Methicillin resistant Staphylococcus aureus infection, unspecified site: Secondary | ICD-10-CM | POA: Diagnosis present

## 2021-11-28 DIAGNOSIS — L02415 Cutaneous abscess of right lower limb: Secondary | ICD-10-CM | POA: Insufficient documentation

## 2021-11-28 DIAGNOSIS — Y838 Other surgical procedures as the cause of abnormal reaction of the patient, or of later complication, without mention of misadventure at the time of the procedure: Secondary | ICD-10-CM | POA: Insufficient documentation

## 2021-11-28 DIAGNOSIS — M6009 Infective myositis, multiple sites: Secondary | ICD-10-CM | POA: Insufficient documentation

## 2021-11-28 DIAGNOSIS — E8809 Other disorders of plasma-protein metabolism, not elsewhere classified: Secondary | ICD-10-CM | POA: Insufficient documentation

## 2021-11-28 DIAGNOSIS — B9562 Methicillin resistant Staphylococcus aureus infection as the cause of diseases classified elsewhere: Secondary | ICD-10-CM | POA: Insufficient documentation

## 2021-11-28 DIAGNOSIS — M4637 Infection of intervertebral disc (pyogenic), lumbosacral region: Secondary | ICD-10-CM | POA: Insufficient documentation

## 2021-11-28 DIAGNOSIS — T80211A Bloodstream infection due to central venous catheter, initial encounter: Secondary | ICD-10-CM | POA: Insufficient documentation

## 2021-11-28 DIAGNOSIS — R7881 Bacteremia: Secondary | ICD-10-CM | POA: Diagnosis not present

## 2021-11-28 DIAGNOSIS — B9689 Other specified bacterial agents as the cause of diseases classified elsewhere: Secondary | ICD-10-CM | POA: Diagnosis not present

## 2021-11-28 DIAGNOSIS — L02416 Cutaneous abscess of left lower limb: Secondary | ICD-10-CM | POA: Diagnosis not present

## 2021-11-28 DIAGNOSIS — D649 Anemia, unspecified: Secondary | ICD-10-CM | POA: Insufficient documentation

## 2021-11-28 LAB — CULTURE, BLOOD (ROUTINE X 2)

## 2021-11-28 NOTE — Progress Notes (Signed)
NAME: Jill Cox  ?DOB: Jun 27, 1976  ?MRN: [id number]  ?Date/Time: 11/28/2021 9:42 AM ? ? ?Subjective:  ?Follow up after recent hospitalization- here with her husband ?? ?Jill Cox is a 46 y.o. with a history of ?10/31/2021 until 11/13/2021 for MRSA infection. ?Patient has complicated infection history ?Her history is as follows.  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  She had persistent bacteremia on 311, 312, 3/13 and 09/27/2021. ?On 09/26/2021 TEE was negative ?Then she had blood cultures on 316, 317 and 319 and they were negative. ?She underwent forearm abscess incision and drainage on 09/30/2021 and also had a fourth dorsal compartment extensor Tina synovectomy.  Culture was positive for MRSA ?She had aspiration of the right knee on 10/02/2021 and that was negative for MRSA.  She had aspiration of the right ankle on 10/02/2021 it was positive for MRSA.  She had a right calf abscess drained and that was positive for MRSA ?She had aspiration of left knee on 10/02/2021 and that was negative for MRSA ?She had left thigh abscess drained on 10/02/2021 and that was positive for MRSA. ?She also had extensive vertebral involvement.  There was right facet joint involvement at T2-T3/T3-T4.  Cervical spine dorsal epidural collection of 5 mm extending C1-C7 with moderate stenosis at C4-C5, C5-C6 and C6-C7 level ?Lumbar MRI done without contrast on 09/28/2021 showed a collection at the left aspect of the thecal sac at L5 measuring 10 into 1123 mm. ?She was initially treated with dual MRSA antibiotic coverage.  She had ceftaroline and daptomycin.  On discharge it was changed to daptomycin to complete 6 weeks until 11/13/2021. ?After she went home she was doing fine for a week and then started doing stretching and started noticing pain left lumbar area.  Also the  right calf I&D site had some foul-smelling discharge and the sutures were removed.  She also felt the left thigh site was full and painful.  Because of worsening pain she came to [facility] on 10/31/2021. ?The blood culture from the PICC site was positive for MRSA but the peripheral site was negative.  She had right calf wound culture which was positive for Proteus and anaerobes.  She underwent Right calf abscess I&D and left thigh abscess I&D and wound VAC was placed ?The culture from the right calf abscess was Proteus and anaerobes. ?The left thigh abscess had no bacteria ?For the epidural abscess at L5-S1 disc space she was seen by Dr. Cari Caraway and underwent L5-S1 laminal foraminotomy for evacuation of epidural abscess.  The culture was positive for MRSA.  She also had a pilonidal sinus and the culture was positive for Proteus.  She was on daptomycin and ceftaroline until discharge.  On discharge she was sent on daptomycin 10 mg/kg body weight to complete 6 weeks on 12/14/2021 and Bactrim double strength to twice daily for 2 weeks for the Proteus and metronidazole for 2 weeks as well.  She is here to see me for follow-up.  She is doing much better. ?She is participating with physical therapy.  She has been walking with a walker and today is the first time she is used a cane ?Physical therapy is asked her to go on the treadmill. ?She is followed at the wound clinic ?And says both the sites are doing well ? ?Past Medical History:  ?Diagnosis Date  ? Depression   ?  Engages in [place]   ? PONV (postoperative nausea and vomiting)   ?  ?Past Surgical History:  ?Procedure Laterality Date  ? CHOLECYSTECTOMY    ? I & D EXTREMITY Left 09/30/2021  ? Procedure: IRRIGATION AND DEBRIDEMENT LEFT FOREARM ABSCESS;  Surgeon: Iran Planas, MD;  Location: [place];  Service: Orthopedics;  Laterality: Left;  ? I & D EXTREMITY Bilateral 10/02/2021  ? Procedure: IRRIGATION AND DEBRIDEMENT EXTREMITY;  Surgeon: Altamese Florida Ridge, MD;  Location: [place];   Service: Orthopedics;  Laterality: Bilateral;  arthrocentsis bilateral knees and right ankle ?  ? INCISION AND DRAINAGE OF WOUND Bilateral 11/02/2021  ? Procedure: IRRIGATION AND DEBRIDEMENT WOUND LEFT THIGH, AND RIGHT CALF;  Surgeon: Hessie Knows, MD;  Location: [facility];  Service: Orthopedics;  Laterality: Bilateral;  RIGHT LOWER LEG, LEFT UPPER LEG  ? LUMBAR LAMINECTOMY FOR EPIDURAL ABSCESS N/A 11/03/2021  ? Procedure: L5-S1 EVACUATION OF EPIDURAL ABSCESS;  Surgeon: Meade Maw, MD;  Location: [facility];  Service: Neurosurgery;  Laterality: N/A;  ? TEE WITHOUT CARDIOVERSION N/A 09/26/2021  ? Procedure: TRANSESOPHAGEAL ECHOCARDIOGRAM (TEE);  Surgeon: Minna Merritts, MD;  Location: [facility];  Service: Cardiovascular;  Laterality: N/A;  ? TEE WITHOUT CARDIOVERSION N/A 11/07/2021  ? Procedure: TRANSESOPHAGEAL ECHOCARDIOGRAM (TEE);  Surgeon: Minna Merritts, MD;  Location: [facility];  Service: Cardiovascular;  Laterality: N/A;  ?  ?Social History  ? ?Socioeconomic History  ? Marital status: Married  ?  Spouse name: Not on file  ? Number of children: Not on file  ? Years of education: Not on file  ? Highest education level: Not on file  ?Occupational History  ? Not on file  ?Tobacco Use  ? Smoking status: Every Day  ?  Types: E-cigarettes  ?  Passive exposure: Past  ? Smokeless tobacco: Never  ? Tobacco comments:  ?  Pt is doing vaping  ?Substance and Sexual Activity  ? Alcohol use: Not Currently  ? Drug use: Never  ? Sexual activity: Not on file  ?Other Topics Concern  ? Not on file  ?Social History Narrative  ? Not on file  ? ?Social Determinants of Health  ? ?Financial Resource Strain: Not on file  ?Food Insecurity: Not on file  ?Transportation Needs: Not on file  ?Physical Activity: Not on file  ?Stress: Not on file  ?Social Connections: Not on file  ?Intimate Partner Violence: Not on file  ?  ?Family History  ?Problem Relation Age of Onset  ? Heart disease Mother   ? Heart disease Father   ? ?No Known  Allergies ?I? ?Current Outpatient Medications  ?Medication Sig Dispense Refill  ? acetaminophen (TYLENOL) 325 MG tablet Take 2 tablets (650 mg total) by mouth every 6 (six) hours as needed for mild pain or fever.    ? daptomycin (CUBICIN) IVPB Inject 800 mg into the vein daily. Indication:  MRSA bacteremia with discitis and epidural abscess ?First Dose: Yes ?Last Day of Therapy:  12/14/2021 ?Labs - Once weekly:  CBC/D, CMP, CRP, ESR and CPK ?Please pull PIC at completion of IV antibiotics ?Fax weekly labs to [phone] ?Method of administration: IV Push ?Method of administration may be changed at the discretion of home infusion pharmacist based upon assessment of the patient and/or caregiver's ability to self-administer the medication ordered. 34 Units 0  ? DAPTOmycin 800 mg in sodium chloride 0.9 % 50 mL Inject 800 mg into the vein daily at 8 pm.    ? lidocaine (XYLOCAINE) 5 % ointment Apply  1 application. topically 4 (four) times daily as needed. 35.44 g 1  ? methocarbamol (ROBAXIN) 500 MG tablet Take 1 tablet (500 mg total) by mouth every 6 (six) hours as needed for muscle spasms. 30 tablet 0  ? metroNIDAZOLE (FLAGYL) 500 MG tablet Take 1 tablet (500 mg total) by mouth 2 (two) times daily for 25 days. 50 tablet 0  ? Multiple Vitamin (MULTIVITAMIN WITH MINERALS) TABS tablet Take 1 tablet by mouth daily.    ? naproxen (NAPROSYN) 500 MG tablet Take 500 mg by mouth 2 (two) times daily as needed.    ? ondansetron (ZOFRAN) 4 MG tablet Take 1 tablet (4 mg total) by mouth every 6 (six) hours as needed for nausea. 20 tablet 0  ? oxyCODONE (OXY IR/ROXICODONE) 5 MG immediate release tablet Take 1 tablet (5 mg total) by mouth every 4 (four) hours as needed for severe pain or moderate pain. 30 tablet 0  ? polyethylene glycol (MIRALAX / GLYCOLAX) 17 g packet Take 17 g by mouth daily as needed for moderate constipation or mild constipation. 14 each 0  ? senna-docusate (SENOKOT-S) 8.6-50 MG tablet Take 1 tablet by mouth at  bedtime as needed for moderate constipation. 30 tablet 0  ? sertraline (ZOLOFT) 25 MG tablet Take 25 mg by mouth every morning.    ? traZODone (DESYREL) 50 MG tablet Take 1 tablet (50 mg total) by mouth at bed

## 2021-11-28 NOTE — Patient Instructions (Addendum)
You are here for follow up of the MRSA infection- doing well- antibiotics till June 1st and then PICC can be removed. Labs are improving- will follow up 4-5 weeks. Will repeat blood culture 12/23/21 ? ?

## 2021-11-30 ENCOUNTER — Encounter: Payer: 59 | Admitting: Physical Therapy

## 2021-12-01 ENCOUNTER — Encounter: Payer: 59 | Admitting: Physician Assistant

## 2021-12-01 DIAGNOSIS — L97128 Non-pressure chronic ulcer of left thigh with other specified severity: Secondary | ICD-10-CM | POA: Diagnosis not present

## 2021-12-02 NOTE — Progress Notes (Addendum)
Jill Cox, Jill Cox ([id number]) Visit Report for 12/01/2021 Chief Complaint Document Details Patient Name: Jill Cox Date of Service: 12/01/2021 8:30 AM Medical Record Number: [id number] Patient Account Number: [id number] Date of Birth/Sex: Feb 01, 1976 (46 y.o. F) Treating RN: Cornell Barman Primary Care Provider: Bubba Camp Other Clinician: Referring Provider: Bubba Camp Treating Provider/Extender: Skipper Cliche in Treatment: 1 Information Obtained from: Patient Chief Complaint 11/20/2021; patient is here for 2 remaining wounds 1 on the left upper anterior thigh and 1 on the right posterior calf. Both of these wounds have wound vacs on them and seem to be in good condition Electronic Signature(s) Signed: 12/01/2021 8:45:47 AM By: Worthy Keeler PA-C Entered By: Worthy Keeler on 12/01/2021 08:45:47 WRENN, STOURS ([id number]) -------------------------------------------------------------------------------- HPI Details Patient Name: Jill Cox Date of Service: 12/01/2021 8:30 AM Medical Record Number: [id number] Patient Account Number: [id number] Date of Birth/Sex: 1976/03/31 (45 y.o. F) Treating RN: Cornell Barman Primary Care Provider: Bubba Camp Other Clinician: Referring Provider: Bubba Camp Treating Provider/Extender: Skipper Cliche in Treatment: 1 History of Present Illness HPI Description: IandDADMIT 11/20/2021 This is a 46 year old healthy woman who presented to the hospital in March with right shoulder pain for which she had received steroid injections. She was admitted from 3/12 through 3/14 with positive MRSA joint pain and sepsis. TEE was negative. Multiple joints were involved. 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 She required use of her right thigh left forearm right calf. On 3/20 she required IandD's of abscesses on the left thigh and right calf aspiration of both  knees and left knee and right ankle. 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 This was debrided by neurosurgery. At that point was put on daptomycin and Bactrim She is currently on Bactrim and Flagyl and daptomycin. She has 2 wounds 1 on the left lateral thigh 1 on the right posterior calf both of these look reasonably healthy. She is using a wound VAC changed by home health minimal drainage. They are here for a second opinion about condition of the wounds. They follow-up with their surgeons in a week's time. Using a wound VAC to both wound areas The patient's family member is a PA in [place] apparently at a wound care center 11-21-2021 upon evaluation today patient appears to be doing well with regard to her wounds. In fact this is the first time of seeing her she saw Dr. Dellia Nims on admission. Nonetheless her wounds are doing significantly better. Fortunately there does not appear to be any evidence of infection locally nor systemically which is great news. No fevers, chills, nausea, vomiting, or diarrhea. Electronic Signature(s) Signed: 12/01/2021 9:23:15 AM By: Worthy Keeler PA-C Entered By: Worthy Keeler on 12/01/2021 09:23:14 PRESCIOUS, SUCHER ([id number]) -------------------------------------------------------------------------------- Physical Exam Details Patient Name: Jill Cox Date of Service: 12/01/2021 8:30 AM Medical Record Number: [id number] Patient Account Number: [id number] Date of Birth/Sex: 10-29-1975 (45 y.o. F) Treating RN: Cornell Barman Primary Care Provider: Bubba Camp Other Clinician: Referring Provider: Bubba Camp Treating Provider/Extender: Jeri Cos Weeks in Treatment: 1 Constitutional Well-nourished and well-hydrated in no acute distress. Respiratory normal breathing without difficulty. Psychiatric this patient is able to make decisions and demonstrates good insight  into disease process. Alert and Oriented x 3. pleasant and cooperative. Notes Upon inspection patient's wound bed actually showed signs of good granulation and epithelization at this  point. I do believe that the wound VAC is probably not necessary currently I do think that she has made excellent progress and I believe the [facility] will continue to function well as far as getting the last little bit of this healed without damaging the good skin around. She is very pleased to hear that she is happy to get rid of the wound VAC. Electronic Signature(s) Signed: 12/01/2021 9:23:39 AM By: Lenda KelpStone III, Bejamin Hackbart PA-C Entered By: Lenda KelpStone III, Deaundra Kutzer on 12/01/2021 09:23:39 Jill Cox, Malone ([id number]) -------------------------------------------------------------------------------- Physician Orders Details Patient Name: Jill Cox, Sharlynn Date of Service: 12/01/2021 8:30 AM Medical Record Number: [id number] Patient Account Number: [id number] Date of Birth/Sex: 24-Feb-1976 (45 y.o. 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 o Nurse Visit as needed Wound #2 Right,Posterior Lower Leg o Return Appointment in 1 week. [facility]. May utilize formulary equivalent dressing for wound treatment orders unless otherwise specified. Home Health Nurse may visit PRN to address patientos wound care needs. - [facility]; Monday, Wednesday Friday unless patient has a  wound care visit that day. o Scheduled days for dressing changes to be completed; exception, patient has scheduled wound care visit that day. o **Please direct any NON-WOUND related issues/requests for orders to patient's Primary Care Physician. **If current dressing causes regression in wound condition, may D/C ordered dressing product/s and apply Normal Saline Moist Dressing daily until next Wound Healing Center or Other MD appointment. **Notify Wound Healing Center of regression in wound condition at [phone]. Bathing/ Shower/ Hygiene o May shower; gently cleanse wound with antibacterial soap, rinse and pat dry prior to dressing wounds Negative Pressure Wound Therapy o Discontinue NPWT. Wound Treatment Wound #1 - Upper Leg Wound Laterality: Left, Lateral Cleanser: Wound Cleanser (Home Health) 3 x Per Week/30 Days Discharge Instructions: Wash your hands with soap and water. Remove old dressing, discard into plastic bag and place into trash. Cleanse the wound with Wound Cleanser prior to applying a clean dressing using gauze sponges, not tissues or cotton balls. Do not scrub or use excessive force. Pat dry using gauze sponges, not tissue or cotton balls. Peri-Wound Care: Skin Prep (Home Health) 3 x Per Week/30 Days Discharge Instructions: Use skin prep as directed Primary Dressing: Hydrofera Blue Ready Transfer Foam, 2.5x2.5 (in/in) (Home Health) 3 x Per Week/30 Days Discharge Instructions: Apply Hydrofera Blue Ready to wound bed as directed Secondary Dressing: (SILICONE BORDER) Zetuvit Plus SILICONE BORDER Dressing 4x4 (in/in) (Home Health) 3 x Per Week/30 Days Discharge Instructions: Please do not put silicone bordered dressings under wraps. Use non-bordered dressing only. Wound #2 - Lower Leg Wound Laterality: Right, Posterior Cleanser: Wound Cleanser (Home Health) 3 x Per Week/30 Days Discharge Instructions: Wash your hands with soap and water. Remove old dressing, discard  into plastic bag and place into trash. Cleanse the wound with Wound Cleanser prior to applying a clean dressing using gauze sponges, not tissues or cotton balls. Do not scrub or use excessive force. Pat dry using gauze sponges, not tissue or cotton balls. Peri-Wound Care: Skin Prep (Home Health) 3 x Per  Week/30 Days Discharge Instructions: Use skin prep as directed SAKIA, BRIZUELA ([id number]) Primary Dressing: Hydrofera Blue Ready Transfer Foam, 2.5x2.5 (in/in) (Home Health) 3 x Per Week/30 Days Discharge Instructions: Apply Hydrofera Blue Ready to wound bed as directed Secondary Dressing: (SILICONE BORDER) Zetuvit Plus SILICONE BORDER Dressing 4x4 (in/in) ([place]) 3 x Per Week/30 Days Discharge Instructions: Please do not put silicone bordered dressings under wraps. Use non-bordered dressing only. Electronic Signature(s) Signed: 12/01/2021 1:47:08 PM By: Worthy Keeler PA-C Signed: 12/04/2021 4:42:58 PM By: Gretta Cool BSN, RN, CWS, Kim RN, BSN Entered By: Gretta Cool, BSN, RN, CWS, Kim on 12/01/2021 09:01:09 Jill Cox ([id number]) -------------------------------------------------------------------------------- Problem List Details Patient Name: Jill Cox Date of Service: 12/01/2021 8:30 AM Medical Record Number: [id number] Patient Account Number: [id number] Date of Birth/Sex: 1975-10-13 (45 y.o. 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 F) Treating RN: Cornell Barman Primary Care Provider: Bubba Camp Other Clinician: Referring Provider: Bubba Camp Treating Provider/Extender: Skipper Cliche in Treatment: 1 Subjective Chief Complaint Information obtained from Patient 11/20/2021; patient is here for 2 remaining wounds 1 on the left upper anterior thigh and 1 on the right posterior calf. Both of these wounds have wound vacs on them and seem to be in good condition History of Present Illness (HPI) IandDADMIT 11/20/2021 This is a 46 year old healthy woman who presented to the hospital in March with right shoulder pain for which she had received steroid injections. She was admitted from 3/12 through 3/14 with positive MRSA joint pain and sepsis. TEE was negative. Multiple joints were involved. 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 She required use of her right thigh left forearm right calf. On 3/20 she required IandD's of abscesses on the left thigh and right calf aspiration of both knees and left knee and right ankle. 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 This was debrided by neurosurgery. At that point was put on daptomycin and Bactrim She is currently on Bactrim and Flagyl and daptomycin. She has 2 wounds 1 on the left lateral thigh 1 on the right posterior calf both of these look reasonably healthy. She is using a wound VAC changed by home health minimal  drainage. They are here for a second opinion about condition of the wounds. They follow-up with their surgeons in a week's time. Using a wound VAC to both wound areas The patient's family member is a PA in [place] apparently at a wound care center 11-21-2021 upon evaluation today patient appears to be doing well with regard to her wounds. In fact this is the first time of seeing her she saw Dr. Dellia Nims on admission. Nonetheless her wounds  are doing significantly better. Fortunately there does not appear to be any evidence of infection locally nor systemically which is great news. No fevers, chills, nausea, vomiting, or diarrhea. Objective Constitutional Well-nourished and well-hydrated in no acute distress. Vitals Time Taken: 8:30 AM, Height: 66 in, Weight: 162 lbs, BMI: 26.1, Temperature: 98.3 F, Pulse: 69 bpm, Respiratory Rate: 16 breaths/min, Blood Pressure: 155/86 mmHg. Respiratory normal breathing without difficulty. Psychiatric this patient is able to make decisions and demonstrates good insight into disease process. Alert and Oriented x 3. pleasant and cooperative. General Notes: Upon inspection patient's wound bed actually showed signs of good granulation and epithelization at this point. I do believe that the wound VAC is probably not necessary currently I do think that she has made excellent progress and I believe the [facility] will continue to function well as far as getting the last little bit of this healed without damaging the good skin around. She is very pleased to hear that she is happy to get rid of the wound VAC. Integumentary (Hair, Skin) Wound #1 status is Open. Original cause of wound was Gradually Appeared. The date acquired was: 11/03/2021. The wound has been in treatment 1 weeks. The wound is located on the Left,Lateral Upper Leg. The wound measures 1.5cm length x 0.4cm width x 0.4cm depth; 0.471cm^2 area and 0.188cm^3 volume. There is Fat Layer (Subcutaneous  Tissue) exposed. There is no tunneling noted, however, there is undermining starting at 9:00 and ending at 12:00 with a maximum distance of 0.5cm. There is a medium amount of serous drainage noted. The wound margin is flat and intact. There is large (67-100%) granulation within the wound bed. There is a small (1-33%) amount of necrotic tissue within the wound bed. Wound #2 status is Open. Original cause of wound was Gradually Appeared. The date acquired was: 11/03/2021. The wound has been in treatment Aceituno, Lu ([id number]) 1 weeks. The wound is located on the Right,Posterior Lower Leg. The wound measures 2cm length x 0.3cm width x 0.4cm depth; 0.471cm^2 area and 0.188cm^3 volume. There is Fat Layer (Subcutaneous Tissue) exposed. There is undermining starting at 6:00 and ending at 12:00 with a maximum distance of 0.2cm. There is a medium amount of serous drainage noted. The wound margin is flat and intact. There is large (67-100%) granulation within the wound bed. There is no necrotic tissue within the wound bed. 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 Nurse Visit as needed Wound #2 Right,Posterior Lower Leg: Return Appointment in 1 week. Home Health: [facility]. May utilize formulary equivalent dressing for wound treatment orders unless otherwise specified. Home Health Nurse may visit PRN to address patient s wound care needs. - [facility]; Monday, Wednesday Friday unless patient has a wound care visit that day. Scheduled days for dressing changes to be completed; exception, patient has scheduled wound care visit that day. **Please direct any NON-WOUND related issues/requests for  orders to patient's Primary Care Physician. **If current dressing causes regression in wound condition, may D/C ordered dressing product/s and apply Normal Saline Moist Dressing daily until next [facility] or Other MD appointment. **Notify Wound Healing Center of regression in wound condition at [phone]. Bathing/ Shower/ Hygiene: May shower; gently cleanse wound with antibacterial soap, rinse and pat dry prior  to dressing wounds Negative Pressure Wound Therapy: Discontinue NPWT. WOUND #1: - Upper Leg Wound Laterality: Left, Lateral Cleanser: Wound Cleanser (Home Health) 3 x Per Week/30 Days Discharge Instructions: Wash your hands with soap and water. Remove old dressing, discard into plastic bag and place into trash. Cleanse the wound with Wound Cleanser prior to applying a clean dressing using gauze sponges, not tissues or cotton balls. Do not scrub or use excessive force. Pat dry using gauze sponges, not tissue or cotton balls. Peri-Wound Care: Skin Prep (Home Health) 3 x Per Week/30 Days Discharge Instructions: Use skin prep as directed Primary Dressing: Hydrofera Blue Ready Transfer Foam, 2.5x2.5 (in/in) (Home Health) 3 x Per Week/30 Days Discharge Instructions: Apply Hydrofera Blue Ready to wound bed as directed Secondary Dressing: (SILICONE BORDER) Zetuvit Plus SILICONE BORDER Dressing 4x4 (in/in) (Home Health) 3 x Per Week/30 Days Discharge Instructions: Please do not put silicone bordered dressings under wraps. Use non-bordered dressing only. WOUND #2: - Lower Leg Wound Laterality: Right, Posterior Cleanser: Wound Cleanser (Home Health) 3 x Per Week/30 Days Discharge Instructions: Wash your hands with soap and water. Remove old dressing, discard into plastic bag and place into trash. Cleanse the wound with Wound Cleanser prior to applying a clean dressing using gauze sponges, not tissues or cotton balls. Do not scrub or use excessive force. Pat dry using gauze sponges,  not tissue or cotton balls. Peri-Wound Care: Skin Prep (Home Health) 3 x Per Week/30 Days Discharge Instructions: Use skin prep as directed Primary Dressing: Hydrofera Blue Ready Transfer Foam, 2.5x2.5 (in/in) (Home Health) 3 x Per Week/30 Days Discharge Instructions: Apply Hydrofera Blue Ready to wound bed as directed Secondary Dressing: (SILICONE BORDER) Zetuvit Plus SILICONE BORDER Dressing 4x4 (in/in) (Home Health) 3 x Per Week/30 Days Discharge Instructions: Please do not put silicone bordered dressings under wraps. Use non-bordered dressing only. 1. I am good recommend currently that we going continue with the wound care measures as before and the patient is in agreement with plan with regard to dressing changes 3 times per week. With that being said we will make a change instead of the wound VAC she will be using Hydrofera Blue. 2. I am also can recommend that we have the patient continue with the border foam dressing to cover which I think is going to do a good job which is Skin-Prep the area around hopefully prevent any irritation. 3. I am also going to suggest that she should continue to follow-up weekly we will keep an eye on things make sure everything is doing fine if anything changes she should let me know in the meantime but honestly I am hopeful we will get this healed fairly quickly. FLORMARIA, MOILANEN ([id number]) We will see patient back for reevaluation in 1 week here in the clinic. If anything worsens or changes patient will contact our office for additional recommendations. Electronic Signature(s) Signed: 12/01/2021 9:24:24 AM By: Worthy Keeler PA-C Entered By: Worthy Keeler on 12/01/2021 09:24:23 Jill Cox ([id number]) -------------------------------------------------------------------------------- SuperBill Details Patient Name: Jill Cox Date of Service: 12/01/2021 Medical Record Number: [id number] Patient Account Number: [id number] Date of Birth/Sex: May 22, 1976 (45  y.o. 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 A41.02 Sepsis due to Methicillin resistant Staphylococcus aureus Facility  Procedures CPT4 Code: [id number] Description: [id number] - WOUND CARE VISIT-LEV 4 EST PT Modifier: Quantity: 1 Physician Procedures CPT4 Code: [id number] Description: [id number] - WC PHYS LEVEL 3 - EST PT Modifier: Quantity: 1 CPT4 Code: Description: ICD-10 Diagnosis Description [id number] 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

## 2021-12-04 NOTE — Progress Notes (Signed)
SARANNE, PINTA ([id number]) Visit Report for 12/01/2021 Arrival Information Details Patient Name: Jill Cox, Jill Cox Date of Service: 12/01/2021 8:30 AM Medical Record Number: [id number] Patient Account Number: [id number] Date of Birth/Sex: July 04, 1976 (46 y.o. F) Treating RN: Cornell Barman Primary Care Mikyla Schachter: Bubba Camp Other Clinician: Referring Aundrea Horace: Bubba Camp Treating Shalynn Jorstad/Extender: Skipper Cliche in Treatment: 1 Visit Information History Since Last Visit Has Dressing in Place as Prescribed: Yes Patient Arrived: Ambulatory Pain Present Now: No Arrival Time: 08:29 Accompanied By: self Transfer Assistance: None Patient Identification Verified: Yes Secondary Verification Process Completed: Yes Patient Has Alerts: Yes Patient Alerts: NOT DIABETIC Electronic Signature(s) Signed: 12/04/2021 4:42:58 PM By: Gretta Cool, BSN, RN, CWS, Kim RN, BSN Entered By: Gretta Cool, BSN, RN, CWS, Kim on 12/01/2021 08:30:34 Jill Cox ([id number]) -------------------------------------------------------------------------------- Clinic Level of Care Assessment Details Patient Name: Jill Cox Date of Service: 12/01/2021 8:30 AM Medical Record Number: [id number] Patient Account Number: [id number] Date of Birth/Sex: [birth year] (45 y.o. F) Treating RN: Cornell Barman Primary Care Annalee Meyerhoff: Bubba Camp Other Clinician: Referring Jennah Satchell: Bubba Camp Treating Caileb Rhue/Extender: Skipper Cliche in Treatment: 1 Clinic Level of Care Assessment Items TOOL 4 Quantity Score []  - Use when only an EandM is performed on FOLLOW-UP visit 0 ASSESSMENTS - Nursing Assessment / Reassessment X - Reassessment of Co-morbidities (includes updates in patient status) 1 10 X- 1 5 Reassessment of Adherence to Treatment Plan ASSESSMENTS - Wound and Skin Assessment / Reassessment []  - Simple Wound Assessment / Reassessment - one wound 0 X- 2 5 Complex Wound Assessment / Reassessment - multiple wounds []   - 0 Dermatologic / Skin Assessment (not related to wound area) ASSESSMENTS - Focused Assessment []  - Circumferential Edema Measurements - multi extremities 0 []  - 0 Nutritional Assessment / Counseling / Intervention []  - 0 Lower Extremity Assessment (monofilament, tuning fork, pulses) []  - 0 Peripheral Arterial Disease Assessment (using hand held doppler) ASSESSMENTS - Ostomy and/or Continence Assessment and Care []  - Incontinence Assessment and Management 0 []  - 0 Ostomy Care Assessment and Management (repouching, etc.) PROCESS - Coordination of Care X - Simple Patient / Family Education for ongoing care 1 15 []  - 0 Complex (extensive) Patient / Family Education for ongoing care X- 1 10 Staff obtains Programmer, systems, Records, Test Results / Process Orders []  - 0 Staff telephones HHA, Nursing Homes / Clarify orders / etc []  - 0 Routine Transfer to another Facility (non-emergent condition) []  - 0 Routine Hospital Admission (non-emergent condition) []  - 0 New Admissions / Biomedical engineer / Ordering NPWT, Apligraf, etc. []  - 0 Emergency Hospital Admission (emergent condition) X- 1 10 Simple Discharge Coordination []  - 0 Complex (extensive) Discharge Coordination PROCESS - Special Needs []  - Pediatric / Minor Patient Management 0 []  - 0 Isolation Patient Management []  - 0 Hearing / Language / Visual special needs []  - 0 Assessment of Community assistance (transportation, D/C planning, etc.) []  - 0 Additional assistance / Altered mentation []  - 0 Support Surface(s) Assessment (bed, cushion, seat, etc.) INTERVENTIONS - Wound Cleansing / Measurement Dolata, Bryanda ([id number]) []  - 0 Simple Wound Cleansing - one wound X- 2 5 Complex Wound Cleansing - multiple wounds X- 1 5 Wound Imaging (photographs - any number of wounds) []  - 0 Wound Tracing (instead of photographs) []  - 0 Simple Wound Measurement - one wound X- 2 5 Complex Wound Measurement - multiple  wounds INTERVENTIONS - Wound Dressings []  - Small Wound Dressing one or multiple wounds 0 X- 2 15 Medium Wound Dressing one or multiple wounds []  -  0 Large Wound Dressing one or multiple wounds []  - 0 Application of Medications - topical []  - 0 Application of Medications - injection INTERVENTIONS - Miscellaneous []  - External ear exam 0 []  - 0 Specimen Collection (cultures, biopsies, blood, body fluids, etc.) []  - 0 Specimen(s) / Culture(s) sent or taken to Lab for analysis []  - 0 Patient Transfer (multiple staff / Harrel Lemon Lift / Similar devices) []  - 0 Simple Staple / Suture removal (25 or less) []  - 0 Complex Staple / Suture removal (26 or more) []  - 0 Hypo / Hyperglycemic Management (close monitor of Blood Glucose) []  - 0 Ankle / Brachial Index (ABI) - do not check if billed separately X- 1 5 Vital Signs Has the patient been seen at the hospital within the last three years: Yes Total Score: 120 Level Of Care: New/Established - Level 4 Electronic Signature(s) Signed: 12/04/2021 4:42:58 PM By: Gretta Cool, BSN, RN, CWS, Kim RN, BSN Entered By: Gretta Cool, BSN, RN, CWS, Kim on 12/01/2021 09:04:12 Jill Cox ([id number]) -------------------------------------------------------------------------------- Encounter Discharge Information Details Patient Name: Jill Cox Date of Service: 12/01/2021 8:30 AM Medical Record Number: [id number] Patient Account Number: [id number] Date of Birth/Sex: [birth year] (45 y.o. F) Treating RN: Cornell Barman Primary Care Jacquelynn Friend: Bubba Camp Other Clinician: Referring Jaida Basurto: Bubba Camp Treating Annistyn Depass/Extender: Skipper Cliche in Treatment: 1 Encounter Discharge Information Items Discharge Condition: Stable Ambulatory Status: Cane Discharge Destination: Home Transportation: Private Auto Accompanied By: self Schedule Follow-up Appointment: Yes Clinical Summary of Care: Electronic Signature(s) Signed: 12/04/2021 4:42:58 PM By:  Gretta Cool, BSN, RN, CWS, Kim RN, BSN Entered By: Gretta Cool, BSN, RN, CWS, Kim on 12/01/2021 09:04:45 Jill Cox ([id number]) -------------------------------------------------------------------------------- Lower Extremity Assessment Details Patient Name: Jill Cox Date of Service: 12/01/2021 8:30 AM Medical Record Number: [id number] Patient Account Number: [id number] Date of Birth/Sex: July 23, 1975 (45 y.o. F) Treating RN: Cornell Barman Primary Care Accalia Rigdon: Bubba Camp Other Clinician: Referring Donielle Radziewicz: Bubba Camp Treating Delan Ksiazek/Extender: Jeri Cos Weeks in Treatment: 1 Edema Assessment Assessed: Shirlyn Goltz: Yes] [Right: Yes] Edema: [Left: No] [Right: No] Vascular Assessment Pulses: Dorsalis Pedis Palpable: [Left:Yes] [Right:Yes] Electronic Signature(s) Signed: 12/04/2021 4:42:58 PM By: Gretta Cool, BSN, RN, CWS, Kim RN, BSN Entered By: Gretta Cool, BSN, RN, CWS, Kim on 12/01/2021 08:45:25 Jill Cox ([id number]) -------------------------------------------------------------------------------- Multi Wound Chart Details Patient Name: Jill Cox Date of Service: 12/01/2021 8:30 AM Medical Record Number: [id number] Patient Account Number: [id number] Date of Birth/Sex: 05-Jul-1976 (45 y.o. F) Treating RN: Cornell Barman Primary Care Tiyah Zelenak: Bubba Camp Other Clinician: Referring Rayan Dyal: Bubba Camp Treating Ermine Stebbins/Extender: Skipper Cliche in Treatment: 1 Vital Signs Height(in): 66 Pulse(bpm): 33 Weight(lbs): 162 Blood Pressure(mmHg): 155/86 Body Mass Index(BMI): 26.1 Temperature(F): 98.3 Respiratory Rate(breaths/min): 16 Photos: [N/A:N/A] Wound Location: Left, Lateral Upper Leg Right, Posterior Lower Leg N/A Wounding Event: Gradually Appeared Gradually Appeared N/A Primary Etiology: Infection - not elsewhere classified Infection - not elsewhere classified N/A Date Acquired: 11/03/2021 11/03/2021 N/A Weeks of Treatment: 1 1 N/A Wound Status: Open Open  N/A Wound Recurrence: No No N/A Measurements L x W x D (cm) 1.5x0.4x0.4 2x0.3x0.4 N/A Area (cm) : 0.471 0.471 N/A Volume (cm) : 0.188 0.188 N/A % Reduction in Area: 66.70% 64.30% N/A % Reduction in Volume: 83.40% 76.30% N/A Starting Position 1 (o'clock): 9 6 Ending Position 1 (o'clock): 12 12 Maximum Distance 1 (cm): 0.5 0.2 Undermining: Yes Yes N/A Classification: Full Thickness Without Exposed Full Thickness Without Exposed N/A Support Structures Support Structures Exudate Amount: Medium Medium N/A Exudate Type: Serous Serous N/A Exudate Color: amber amber N/A Wound Margin:  Flat and Intact Flat and Intact N/A Granulation Amount: Large (67-100%) Large (67-100%) N/A Necrotic Amount: Small (1-33%) None Present (0%) N/A Exposed Structures: Fat Layer (Subcutaneous Tissue): Fat Layer (Subcutaneous Tissue): N/A Yes Yes Epithelialization: None None N/A Treatment Notes Electronic Signature(s) Signed: 12/04/2021 4:42:58 PM By: Gretta Cool, BSN, RN, CWS, Kim RN, BSN Entered By: Gretta Cool, BSN, RN, CWS, Kim on 12/01/2021 08:58:34 Jill Cox ([id number]) -------------------------------------------------------------------------------- Multi-Disciplinary Care Plan Details Patient Name: Jill Cox Date of Service: 12/01/2021 8:30 AM Medical Record Number: [id number] Patient Account Number: [id number] Date of Birth/Sex: 24-May-1976 (45 y.o. F) Treating RN: Cornell Barman Primary Care Yoshiaki Kreuser: Bubba Camp Other Clinician: Referring Lj Miyamoto: Bubba Camp Treating Petra Sargeant/Extender: Skipper Cliche in Treatment: 1 Active Inactive Orientation to the Wound Care Program Nursing Diagnoses: Knowledge deficit related to the wound healing center program Goals: Patient/caregiver will verbalize understanding of the [facility] Program Date Initiated: 11/20/2021 Target Resolution Date: 11/21/2021 Goal Status: Active Interventions: Provide education on orientation to the wound  center Notes: Soft Tissue Infection Nursing Diagnoses: Impaired tissue integrity Knowledge deficit related to disease process and management Knowledge deficit related to home infection control: handwashing, handling of soiled dressings, supply storage Potential for infection: soft tissue Goals: Patient/caregiver will verbalize understanding of or measures to prevent infection and contamination in the home setting Date Initiated: 11/20/2021 Target Resolution Date: 11/20/2021 Goal Status: Active Patient's soft tissue infection will resolve Date Initiated: 11/20/2021 Target Resolution Date: 11/20/2021 Goal Status: Active Signs and symptoms of infection will be recognized early to allow for prompt treatment Date Initiated: 11/20/2021 Target Resolution Date: 11/20/2021 Goal Status: Active Interventions: Assess signs and symptoms of infection every visit Notes: Wound/Skin Impairment Nursing Diagnoses: Impaired tissue integrity Knowledge deficit related to smoking impact on wound healing Knowledge deficit related to ulceration/compromised skin integrity Goals: Patient/caregiver will verbalize understanding of skin care regimen Date Initiated: 11/20/2021 Target Resolution Date: 11/20/2021 Goal Status: Active Ulcer/skin breakdown will have a volume reduction of 30% by week 4 Date Initiated: 11/20/2021 Target Resolution Date: 12/18/2021 Goal Status: Active TAWNIA, ERBER ([id number]) Interventions: Assess patient/caregiver ability to obtain necessary supplies Assess patient/caregiver ability to perform ulcer/skin care regimen upon admission and as needed Assess ulceration(s) every visit Provide education on smoking Treatment Activities: Referred to DME Kyona Chauncey for dressing supplies : 11/20/2021 Skin care regimen initiated : 11/20/2021 Notes: Electronic Signature(s) Signed: 12/04/2021 4:42:58 PM By: Gretta Cool, BSN, RN, CWS, Kim RN, BSN Entered By: Gretta Cool, BSN, RN, CWS, Kim on 12/01/2021 08:51:31 Jill Cox  ([id number]) -------------------------------------------------------------------------------- Pain Assessment Details Patient Name: Jill Cox Date of Service: 12/01/2021 8:30 AM Medical Record Number: [id number] Patient Account Number: [id number] Date of Birth/Sex: Feb 09, 1976 (45 y.o. F) Treating RN: Cornell Barman Primary Care Bond Grieshop: Bubba Camp Other Clinician: Referring Levell Tavano: Bubba Camp Treating Estel Scholze/Extender: Skipper Cliche in Treatment: 1 Active Problems Location of Pain Severity and Description of Pain Patient Has Paino No Site Locations Pain Management and Medication Current Pain Management: Electronic Signature(s) Signed: 12/04/2021 4:42:58 PM By: Gretta Cool, BSN, RN, CWS, Kim RN, BSN Entered By: Gretta Cool, BSN, RN, CWS, Kim on 12/01/2021 08:35:45 Jill Cox ([id number]) -------------------------------------------------------------------------------- Wound Assessment Details Patient Name: Jill Cox Date of Service: 12/01/2021 8:30 AM Medical Record Number: [id number] Patient Account Number: [id number] Date of Birth/Sex: 04/23/1976 (45 y.o. F) Treating RN: Cornell Barman Primary Care Antiono Ettinger: Bubba Camp Other Clinician: Referring Ranferi Clingan: Bubba Camp Treating Szymon Foiles/Extender: Jeri Cos Weeks in Treatment: 1 Wound Status Wound Number: 1 Primary Etiology: Infection - not elsewhere classified Wound Location: Left, Lateral Upper Leg Wound Status:  Open Wounding Event: Gradually Appeared Date Acquired: 11/03/2021 Weeks Of Treatment: 1 Clustered Wound: No Photos Wound Measurements Length: (cm) 1.5 Width: (cm) 0.4 Depth: (cm) 0.4 Area: (cm) 0.471 Volume: (cm) 0.188 % Reduction in Area: 66.7% % Reduction in Volume: 83.4% Epithelialization: None Tunneling: No Undermining: Yes Starting Position (o'clock): 9 Ending Position (o'clock): 12 Maximum Distance: (cm) 0.5 Wound Description Classification: Full Thickness Without Exposed  Support Structures Wound Margin: Flat and Intact Exudate Amount: Medium Exudate Type: Serous Exudate Color: amber Foul Odor After Cleansing: No Slough/Fibrino Yes Wound Bed Granulation Amount: Large (67-100%) Exposed Structure Necrotic Amount: Small (1-33%) Fat Layer (Subcutaneous Tissue) Exposed: Yes Treatment Notes Wound #1 (Upper Leg) Wound Laterality: Left, Lateral Cleanser Wound Cleanser Discharge Instruction: Wash your hands with soap and water. Remove old dressing, discard into plastic bag and place into trash. Cleanse the wound with Wound Cleanser prior to applying a clean dressing using gauze sponges, not tissues or cotton balls. Do not scrub or use excessive force. Pat dry using gauze sponges, not tissue or cotton balls. TAMANNA, TERZO ([id number]) Peri-Wound Care Skin Prep Discharge Instruction: Use skin prep as directed Topical Primary Dressing Hydrofera Blue Ready Transfer Foam, 2.5x2.5 (in/in) Discharge Instruction: Apply Hydrofera Blue Ready to wound bed as directed Secondary Dressing (SILICONE BORDER) Zetuvit Plus SILICONE BORDER Dressing 4x4 (in/in) Discharge Instruction: Please do not put silicone bordered dressings under wraps. Use non-bordered dressing only. Secured With Compression Wrap Compression Stockings Environmental education officer) Signed: 12/04/2021 4:42:58 PM By: Gretta Cool, BSN, RN, CWS, Kim RN, BSN Entered By: Gretta Cool, BSN, RN, CWS, Kim on 12/01/2021 08:43:30 Jill Cox ([id number]) -------------------------------------------------------------------------------- Wound Assessment Details Patient Name: Jill Cox Date of Service: 12/01/2021 8:30 AM Medical Record Number: [id number] Patient Account Number: [id number] Date of Birth/Sex: 09-Aug-1975 (45 y.o. F) Treating RN: Cornell Barman Primary Care Gaige Sebo: Bubba Camp Other Clinician: Referring Reef Achterberg: Bubba Camp Treating Derwin Reddy/Extender: Jeri Cos Weeks in Treatment: 1 Wound  Status Wound Number: 2 Primary Etiology: Infection - not elsewhere classified Wound Location: Right, Posterior Lower Leg Wound Status: Open Wounding Event: Gradually Appeared Date Acquired: 11/03/2021 Weeks Of Treatment: 1 Clustered Wound: No Photos Wound Measurements Length: (cm) 2 Width: (cm) 0.3 Depth: (cm) 0.4 Area: (cm) 0.471 Volume: (cm) 0.188 % Reduction in Area: 64.3% % Reduction in Volume: 76.3% Epithelialization: None Undermining: Yes Starting Position (o'clock): 6 Ending Position (o'clock): 12 Maximum Distance: (cm) 0.2 Wound Description Classification: Full Thickness Without Exposed Support Structures Wound Margin: Flat and Intact Exudate Amount: Medium Exudate Type: Serous Exudate Color: amber Foul Odor After Cleansing: No Slough/Fibrino No Wound Bed Granulation Amount: Large (67-100%) Exposed Structure Necrotic Amount: None Present (0%) Fat Layer (Subcutaneous Tissue) Exposed: Yes Treatment Notes Wound #2 (Lower Leg) Wound Laterality: Right, Posterior Cleanser Wound Cleanser Discharge Instruction: Wash your hands with soap and water. Remove old dressing, discard into plastic bag and place into trash. Cleanse the wound with Wound Cleanser prior to applying a clean dressing using gauze sponges, not tissues or cotton balls. Do not scrub or use excessive force. Pat dry using gauze sponges, not tissue or cotton balls. Peri-Wound Care MAJORIE, ALLANSON ([id number]) Skin Prep Discharge Instruction: Use skin prep as directed Topical Primary Dressing Hydrofera Blue Ready Transfer Foam, 2.5x2.5 (in/in) Discharge Instruction: Apply Hydrofera Blue Ready to wound bed as directed Secondary Dressing (SILICONE BORDER) [place] Dressing 4x4 (in/in) Discharge Instruction: Please do not put silicone bordered dressings under wraps. Use non-bordered dressing only. Secured With Compression Wrap Compression Stockings Sport and exercise psychologist) Signed: 12/04/2021 4:42:58  PM By: Gretta Cool, BSN, RN, CWS, Kim RN, BSN Entered By: Gretta Cool, BSN, RN, CWS, Kim on 12/01/2021 08:44:50 Jill Cox ([id number]) -------------------------------------------------------------------------------- Vitals Details Patient Name: Jill Cox Date of Service: 12/01/2021 8:30 AM Medical Record Number: [id number] Patient Account Number: [id number] Date of Birth/Sex: February 21, 1976 (45 y.o. F) Treating RN: Cornell Barman Primary Care Manahil Vanzile: Bubba Camp Other Clinician: Referring Lc Joynt: Bubba Camp Treating Tajai Suder/Extender: Skipper Cliche in Treatment: 1 Vital Signs Time Taken: 08:30 Temperature (F): 98.3 Height (in): 66 Pulse (bpm): 69 Weight (lbs): 162 Respiratory Rate (breaths/min): 16 Body Mass Index (BMI): 26.1 Blood Pressure (mmHg): 155/86 Reference Range: 80 - 120 mg / dl Electronic Signature(s) Signed: 12/04/2021 4:42:58 PM By: Gretta Cool, BSN, RN, CWS, Kim RN, BSN Entered By: Gretta Cool, BSN, RN, CWS, Kim on 12/01/2021 08:31:41

## 2021-12-05 ENCOUNTER — Encounter: Admitting: Physical Therapy

## 2021-12-07 ENCOUNTER — Encounter: Admitting: Physical Therapy

## 2021-12-08 ENCOUNTER — Encounter: Payer: 59 | Admitting: Physician Assistant

## 2021-12-08 DIAGNOSIS — L97128 Non-pressure chronic ulcer of left thigh with other specified severity: Secondary | ICD-10-CM | POA: Diagnosis not present

## 2021-12-08 NOTE — Progress Notes (Signed)
VINNIE, BOBST ([id number]) Visit Report for 12/08/2021 Arrival Information Details Patient Name: Jill Cox, Jill Cox Date of Service: 12/08/2021 8:30 AM Medical Record Number: [id number] Patient Account Number: [id number] Date of Birth/Sex: 10-04-1975 (46 y.o. F) Treating RN: Levora Dredge Primary Care Aanvi Voyles: Bubba Camp Other Clinician: Referring Julaine Zimny: Bubba Camp Treating Dreden Rivere/Extender: Skipper Cliche in Treatment: 2 Visit Information History Since Last Visit Added or deleted any medications: No Patient Arrived: Ambulatory Any new allergies or adverse reactions: No Arrival Time: 08:27 Had a fall or experienced change in No Accompanied By: husband activities of daily living that may affect Transfer Assistance: None risk of falls: Patient Identification Verified: Yes Hospitalized since last visit: No Secondary Verification Process Completed: Yes Has Dressing in Place as Prescribed: Yes Patient Has Alerts: Yes Pain Present Now: Yes Patient Alerts: NOT DIABETIC Electronic Signature(s) Signed: 12/08/2021 4:31:40 PM By: Levora Dredge Entered By: Levora Dredge on 12/08/2021 08:28:21 Jill Cox ([id number]) -------------------------------------------------------------------------------- Clinic Level of Care Assessment Details Patient Name: Jill Cox Date of Service: 12/08/2021 8:30 AM Medical Record Number: [id number] Patient Account Number: [id number] Date of Birth/Sex: 1975-10-11 (46 y.o. F) Treating RN: Levora Dredge Primary Care Jeffery Gammell: Bubba Camp Other Clinician: Referring Kayvion Arneson: Bubba Camp Treating Erianna Jolly/Extender: Skipper Cliche in Treatment: 2 Clinic Level of Care Assessment Items TOOL 4 Quantity Score _0  - Use when only an EandM is performed on FOLLOW-UP visit 0 ASSESSMENTS - Nursing Assessment / Reassessment X - Reassessment of Co-morbidities (includes updates in patient status) 1 10 _1  - 0 Reassessment of  Adherence to Treatment Plan ASSESSMENTS - Wound and Skin Assessment / Reassessment X - Simple Wound Assessment / Reassessment - one wound 1 5 _2  - 0 Complex Wound Assessment / Reassessment - multiple wounds _3  - 0 Dermatologic / Skin Assessment (not related to wound area) ASSESSMENTS - Focused Assessment _4  - Circumferential Edema Measurements - multi extremities 0 _5  - 0 Nutritional Assessment / Counseling / Intervention _6  - 0 Lower Extremity Assessment (monofilament, tuning fork, pulses) _7  - 0 Peripheral Arterial Disease Assessment (using hand held doppler) ASSESSMENTS - Ostomy and/or Continence Assessment and Care _8  - Incontinence Assessment and Management 0 _9  - 0 Ostomy Care Assessment and Management (repouching, etc.) PROCESS - Coordination of Care X - Simple Patient / Family Education for ongoing care 1 15 _10  - 0 Complex (extensive) Patient / Family Education for ongoing care _11  - 0 Staff obtains Programmer, systems, Records, Test Results / Process Orders _12  - 0 Staff telephones HHA, Nursing Homes / Clarify orders / etc _13  - 0 Routine Transfer to another Facility (non-emergent condition) _14  - 0 Routine Hospital Admission (non-emergent condition) _15  - 0 New Admissions / Biomedical engineer / Ordering NPWT, Apligraf, etc. _16  - 0 Emergency Hospital Admission (emergent condition) X- 1 10 Simple Discharge Coordination _17  - 0 Complex (extensive) Discharge Coordination PROCESS - Special Needs _18  - Pediatric / Minor Patient Management 0 _19  - 0 Isolation Patient Management _20  - 0 Hearing / Language / Visual special needs _21  - 0 Assessment of Community assistance (transportation, D/C planning, etc.) _22  - 0 Additional assistance / Altered mentation _23  - 0 Support Surface(s) Assessment (bed, cushion, seat, etc.) INTERVENTIONS - Wound Cleansing / Measurement Matas, Jeffrey ([id number]) _24  - 0 Simple Wound Cleansing - one wound X- 2 5 Complex Wound Cleansing - multiple  wounds X- 1 5 Wound Imaging (photographs - any number of wounds) _25  - 0 Wound Tracing (instead of photographs) _26  - 0 Simple Wound Measurement - one wound X- 2 5 Complex Wound  Measurement - multiple wounds INTERVENTIONS - Wound Dressings X - Small Wound Dressing one or multiple wounds 2 10 _0  - 0 Medium Wound Dressing one or multiple wounds _1  - 0 Large Wound Dressing one or multiple wounds X- 1 5 Application of Medications - topical <[id number]>_2<\/JJHERDEYCXKGYJEH>_6  - 0 Application of Medications - injection INTERVENTIONS - Miscellaneous _3  - External ear exam 0 _4  - 0 Specimen Collection (cultures, biopsies, blood, body fluids, etc.) _5  - 0 Specimen(s) / Culture(s) sent or taken to Lab for analysis _6  - 0 Patient Transfer (multiple staff / Harrel Lemon Lift / Similar devices) _7  - 0 Simple Staple / Suture removal (25 or less) _8  - 0 Complex Staple / Suture removal (26 or more) _9  - 0 Hypo / Hyperglycemic Management (close monitor of Blood Glucose) _10  - 0 Ankle / Brachial Index (ABI) - do not check if billed separately X- 1 5 Vital Signs Has the patient been seen at the hospital within the last three years: Yes Total Score: 95 Level Of Care: New/Established - Level 3 Electronic Signature(s) Signed: 12/08/2021 4:31:40 PM By: Levora Dredge Entered By: Levora Dredge on 12/08/2021 09:16:34 Jill Cox ([id number]) -------------------------------------------------------------------------------- Encounter Discharge Information Details Patient Name: Jill Cox Date of Service: 12/08/2021 8:30 AM Medical Record Number: [id number] Patient Account Number: [id number] Date of Birth/Sex: 1975/12/28 (46 y.o. F) Treating RN: Levora Dredge Primary Care Shenita Trego: Bubba Camp Other Clinician: Referring Eliyahu Bille: Bubba Camp Treating Dejae Bernet/Extender: Skipper Cliche in Treatment: 2 Encounter Discharge Information Items Discharge Condition: Stable Ambulatory Status: Ambulatory Discharge  Destination: Home Transportation: Private Auto Accompanied By: husband Schedule Follow-up Appointment: Yes Clinical Summary of Care: Electronic Signature(s) Signed: 12/08/2021 4:31:40 PM By: Levora Dredge Entered By: Levora Dredge on 12/08/2021 09:17:23 Jill Cox ([id number]) -------------------------------------------------------------------------------- Lower Extremity Assessment Details Patient Name: Jill Cox Date of Service: 12/08/2021 8:30 AM Medical Record Number: [id number] Patient Account Number: [id number] Date of Birth/Sex: 1975-11-13 (46 y.o. F) Treating RN: Levora Dredge Primary Care Jodine Muchmore: Bubba Camp Other Clinician: Referring Kristene Liberati: Bubba Camp Treating Mildreth Reek/Extender: Jeri Cos Weeks in Treatment: 2 Edema Assessment Assessed: [Left: No] [Right: No] Edema: [Left: No] [Right: No] Electronic Signature(s) Signed: 12/08/2021 4:31:40 PM By: Levora Dredge Entered By: Levora Dredge on 12/08/2021 08:41:24 Jill Cox, Jill Cox ([id number]) -------------------------------------------------------------------------------- Multi Wound Chart Details Patient Name: Jill Cox Date of Service: 12/08/2021 8:30 AM Medical Record Number: [id number] Patient Account Number: [id number] Date of Birth/Sex: 25-Oct-1975 (46 y.o. F) Treating RN: Levora Dredge Primary Care Chantia Amalfitano: Bubba Camp Other Clinician: Referring Thaniel Coluccio: Bubba Camp Treating Carlo Lorson/Extender: Skipper Cliche in Treatment: 2 Vital Signs Height(in): 66 Pulse(bpm): 4 Weight(lbs): 162 Blood Pressure(mmHg): 149/84 Body Mass Index(BMI): 26.1 Temperature(F): 97.8 Respiratory Rate(breaths/min): 18 Photos: [N/A:N/A] Wound Location: Left, Lateral Upper Leg Right, Posterior Lower Leg N/A Wounding Event: Gradually Appeared Gradually Appeared N/A Primary Etiology: Infection - not elsewhere classified Infection - not elsewhere classified N/A Date Acquired: 11/03/2021  11/03/2021 N/A Weeks of Treatment: 2 2 N/A Wound Status: Open Open N/A Wound Recurrence: No No N/A Measurements L x W x D (cm) 0.5x0.1x0.2 0.1x0.1x0.1 N/A Area (cm) : 0.039 0.008 N/A Volume (cm) : 0.008 0.001 N/A % Reduction in Area: 97.20% 99.40% N/A % Reduction in Volume: 99.30% 99.90% N/A Classification: Full Thickness Without Exposed Full Thickness Without Exposed N/A Support Structures Support Structures Exudate Amount: Medium Medium N/A Exudate Type: Serous Serous N/A Exudate Color: amber amber N/A Wound Margin: Flat and Intact Flat and Intact N/A Granulation Amount: Large (67-100%) Large (67-100%) N/A Granulation Quality: N/A Pale N/A Necrotic Amount: Small (1-33%)  None Present (0%) N/A Exposed Structures: Fat Layer (Subcutaneous Tissue): Fat Layer (Subcutaneous Tissue): N/A Yes Yes Epithelialization: Medium (34-66%) Small (1-33%) N/A Treatment Notes Electronic Signature(s) Signed: 12/08/2021 4:31:40 PM By: Levora Dredge Entered By: Levora Dredge on 12/08/2021 09:04:00 Jill Cox ([id number]) -------------------------------------------------------------------------------- Multi-Disciplinary Care Plan Details Patient Name: Jill Cox Date of Service: 12/08/2021 8:30 AM Medical Record Number: [id number] Patient Account Number: [id number] Date of Birth/Sex: 06/13/1976 (46 y.o. F) Treating RN: Levora Dredge Primary Care Mickey Esguerra: Bubba Camp Other Clinician: Referring Deneane Stifter: Bubba Camp Treating Emony Dormer/Extender: Skipper Cliche in Treatment: 2 Active Inactive Soft Tissue Infection Nursing Diagnoses: Impaired tissue integrity Knowledge deficit related to disease process and management Knowledge deficit related to home infection control: handwashing, handling of soiled dressings, supply storage Potential for infection: soft tissue Goals: Patient/caregiver will verbalize understanding of or measures to prevent infection and contamination in  the home setting Date Initiated: 11/20/2021 Date Inactivated: 12/08/2021 Target Resolution Date: 11/20/2021 Goal Status: Met Patient's soft tissue infection will resolve Date Initiated: 11/20/2021 Target Resolution Date: 11/20/2021 Goal Status: Active Signs and symptoms of infection will be recognized early to allow for prompt treatment Date Initiated: 11/20/2021 Target Resolution Date: 11/20/2021 Goal Status: Active Interventions: Assess signs and symptoms of infection every visit Notes: Wound/Skin Impairment Nursing Diagnoses: Impaired tissue integrity Knowledge deficit related to smoking impact on wound healing Knowledge deficit related to ulceration/compromised skin integrity Goals: Patient/caregiver will verbalize understanding of skin care regimen Date Initiated: 11/20/2021 Date Inactivated: 12/08/2021 Target Resolution Date: 11/20/2021 Goal Status: Met Ulcer/skin breakdown will have a volume reduction of 30% by week 4 Date Initiated: 11/20/2021 Target Resolution Date: 12/18/2021 Goal Status: Active Interventions: Assess patient/caregiver ability to obtain necessary supplies Assess patient/caregiver ability to perform ulcer/skin care regimen upon admission and as needed Assess ulceration(s) every visit Provide education on smoking Treatment Activities: Referred to DME Harmoney Sienkiewicz for dressing supplies : 11/20/2021 Skin care regimen initiated : 11/20/2021 Notes: Jill Cox, Jill Cox ([id number]) Electronic Signature(s) Signed: 12/08/2021 4:31:40 PM By: Levora Dredge Entered By: Levora Dredge on 12/08/2021 09:03:48 Jill Cox ([id number]) -------------------------------------------------------------------------------- Pain Assessment Details Patient Name: Jill Cox Date of Service: 12/08/2021 8:30 AM Medical Record Number: [id number] Patient Account Number: [id number] Date of Birth/Sex: 1975/09/29 (46 y.o. F) Treating RN: Levora Dredge Primary Care Juliya Magill: Bubba Camp Other  Clinician: Referring Grae Leathers: Bubba Camp Treating Aashir Umholtz/Extender: Skipper Cliche in Treatment: 2 Active Problems Location of Pain Severity and Description of Pain Patient Has Paino Yes Site Locations Rate the pain. Current Pain Level: 4 Pain Management and Medication Current Pain Management: Notes pt states pain more in morning time Electronic Signature(s) Signed: 12/08/2021 4:31:40 PM By: Levora Dredge Entered By: Levora Dredge on 12/08/2021 08:31:09 Jill Cox ([id number]) -------------------------------------------------------------------------------- Patient/Caregiver Education Details Patient Name: Jill Cox Date of Service: 12/08/2021 8:30 AM Medical Record Number: [id number] Patient Account Number: [id number] Date of Birth/Gender: 04-05-1976 (46 y.o. F) Treating RN: Levora Dredge Primary Care Physician: Bubba Camp Other Clinician: Referring Physician: Bubba Camp Treating Physician/Extender: Skipper Cliche in Treatment: 2 Education Assessment Education Provided To: Patient and Caregiver Education Topics Provided Wound/Skin Impairment: Handouts: Caring for Your Ulcer Methods: Explain/Verbal Responses: State content correctly Electronic Signature(s) Signed: 12/08/2021 4:31:40 PM By: Levora Dredge Entered By: Levora Dredge on 12/08/2021 09:16:54 Jill Cox ([id number]) -------------------------------------------------------------------------------- Wound Assessment Details Patient Name: Jill Cox Date of Service: 12/08/2021 8:30 AM Medical Record Number: [id number] Patient Account Number: [id number] Date of Birth/Sex: 12/19/1975 (46 y.o. F) Treating RN: Levora Dredge Primary Care Coleman Kalas: Bubba Camp Other Clinician: Referring Teresa Lemmerman: Bubba Camp  Treating Puneet Selden/Extender: Jeri Cos Weeks in Treatment: 2 Wound Status Wound Number: 1 Primary Etiology: Infection - not elsewhere classified Wound  Location: Left, Lateral Upper Leg Wound Status: Open Wounding Event: Gradually Appeared Date Acquired: 11/03/2021 Weeks Of Treatment: 2 Clustered Wound: No Photos Wound Measurements Length: (cm) 0.5 Width: (cm) 0.1 Depth: (cm) 0.2 Area: (cm) 0.039 Volume: (cm) 0.008 % Reduction in Area: 97.2% % Reduction in Volume: 99.3% Epithelialization: Medium (34-66%) Tunneling: No Undermining: No Wound Description Classification: Full Thickness Without Exposed Support Structu Wound Margin: Flat and Intact Exudate Amount: Medium Exudate Type: Serous Exudate Color: amber res Foul Odor After Cleansing: No Slough/Fibrino Yes Wound Bed Granulation Amount: Large (67-100%) Exposed Structure Necrotic Amount: Small (1-33%) Fat Layer (Subcutaneous Tissue) Exposed: Yes Treatment Notes Wound #1 (Upper Leg) Wound Laterality: Left, Lateral Cleanser Wound Cleanser Discharge Instruction: Wash your hands with soap and water. Remove old dressing, discard into plastic bag and place into trash. Cleanse the wound with Wound Cleanser prior to applying a clean dressing using gauze sponges, not tissues or cotton balls. Do not scrub or use excessive force. Pat dry using gauze sponges, not tissue or cotton balls. Peri-Wound Care Skin Prep Discharge Instruction: Use skin prep as directed Jill Cox, Jill Cox ([id number]) Topical Primary Dressing Prisma 4.34 (in) Discharge Instruction: Moisten w/ hydrogel, [place] gel, or surgi gel Secondary Dressing (SILICONE BORDER) [place] Dressing 4x4 (in/in) Discharge Instruction: Please do not put silicone bordered dressings under wraps. Use non-bordered dressing only. Secured With Compression Wrap Compression Stockings Add-Ons Electronic Signature(s) Signed: 12/08/2021 4:31:40 PM By: Levora Dredge Entered By: Levora Dredge on 12/08/2021 08:40:39 Jill Cox, Jill Cox  ([id number]) -------------------------------------------------------------------------------- Wound Assessment Details Patient Name: Jill Cox Date of Service: 12/08/2021 8:30 AM Medical Record Number: [id number] Patient Account Number: [id number] Date of Birth/Sex: 02/25/1976 (46 y.o. F) Treating RN: Levora Dredge Primary Care Jeshurun [place]aca: Bubba Camp Other Clinician: Referring Reilly Blades: Bubba Camp Treating Donell Sliwinski/Extender: Jeri Cos Weeks in Treatment: 2 Wound Status Wound Number: 2 Primary Etiology: Infection - not elsewhere classified Wound Location: Right, Posterior Lower Leg Wound Status: Open Wounding Event: Gradually Appeared Date Acquired: 11/03/2021 Weeks Of Treatment: 2 Clustered Wound: No Photos Wound Measurements Length: (cm) 0.1 Width: (cm) 0.1 Depth: (cm) 0.1 Area: (cm) 0.008 Volume: (cm) 0.001 % Reduction in Area: 99.4% % Reduction in Volume: 99.9% Epithelialization: Small (1-33%) Tunneling: No Undermining: No Wound Description Classification: Full Thickness Without Exposed Support Structures Wound Margin: Flat and Intact Exudate Amount: Medium Exudate Type: Serous Exudate Color: amber Foul Odor After Cleansing: No Slough/Fibrino No Wound Bed Granulation Amount: Large (67-100%) Exposed Structure Granulation Quality: Pale Fat Layer (Subcutaneous Tissue) Exposed: Yes Necrotic Amount: None Present (0%) Treatment Notes Wound #2 (Lower Leg) Wound Laterality: Right, Posterior Cleanser Wound Cleanser Discharge Instruction: Wash your hands with soap and water. Remove old dressing, discard into plastic bag and place into trash. Cleanse the wound with Wound Cleanser prior to applying a clean dressing using gauze sponges, not tissues or cotton balls. Do not scrub or use excessive force. Pat dry using gauze sponges, not tissue or cotton balls. Peri-Wound Care Skin Prep Jill Cox, Jill Cox ([id number]) Discharge Instruction: Use skin prep as  directed Topical Primary Dressing Hydrofera Blue Ready Transfer Foam, 2.5x2.5 (in/in) Discharge Instruction: Apply Hydrofera Blue Ready to wound bed as directed Secondary Dressing (SILICONE BORDER) Zetuvit Plus SILICONE BORDER Dressing 4x4 (in/in) Discharge Instruction: Please do not put silicone bordered dressings under wraps. Use non-bordered dressing only. Secured With Compression Wrap Compression Stockings Environmental education officer) Signed: 12/08/2021 4:31:40 PM  By: Levora Dredge Entered By: Levora Dredge on 12/08/2021 08:41:11 Jill Cox ([id number]) -------------------------------------------------------------------------------- Vitals Details Patient Name: Jill Cox Date of Service: 12/08/2021 8:30 AM Medical Record Number: [id number] Patient Account Number: [id number] Date of Birth/Sex: 06/28/1976 (45 y.o. F) Treating RN: Levora Dredge Primary Care Sarah Zerby: Bubba Camp Other Clinician: Referring Amante Fomby: Bubba Camp Treating Karis Rilling/Extender: Skipper Cliche in Treatment: 2 Vital Signs Time Taken: 08:28 Temperature (F): 97.8 Height (in): 66 Pulse (bpm): 79 Weight (lbs): 162 Respiratory Rate (breaths/min): 18 Body Mass Index (BMI): 26.1 Blood Pressure (mmHg): 149/84 Reference Range: 80 - 120 mg / dl Electronic Signature(s) Signed: 12/08/2021 4:31:40 PM By: Levora Dredge Entered By: Levora Dredge on 12/08/2021 08:30:38

## 2021-12-08 NOTE — Progress Notes (Addendum)
OMAR, ORREGO ([id number]) Visit Report for 12/08/2021 Chief Complaint Document Details Patient Name: Jill Cox, Jill Cox Date of Service: 12/08/2021 8:30 AM Medical Record Number: [id number] Patient Account Number: [id number] Date of Birth/Sex: August 20, 1975 (46 y.o. F) Treating RN: Angelina Pih Primary Care Provider: Cleon Dew Other Clinician: Referring Provider: Cleon Dew Treating Provider/Extender: Rowan Blase in Treatment: 2 Information Obtained from: Patient Chief Complaint 11/20/2021; patient is here for 2 remaining wounds 1 on the left upper anterior thigh and 1 on the right posterior calf. Both of these wounds have wound vacs on them and seem to be in good condition Electronic Signature(s) Signed: 12/08/2021 8:32:12 AM By: Lenda Kelp PA-C Entered By: Lenda Kelp on 12/08/2021 08:32:11 Jill Cox, Jill Cox ([id number]) -------------------------------------------------------------------------------- HPI Details Patient Name: Jill Cox Date of Service: 12/08/2021 8:30 AM Medical Record Number: [id number] Patient Account Number: [id number] Date of Birth/Sex: 02/24/1976 (45 y.o. F) Treating RN: Angelina Pih Primary Care Provider: Cleon Dew Other Clinician: Referring Provider: Cleon Dew Treating Provider/Extender: Rowan Blase in Treatment: 2 History of Present Illness HPI Description: IandDADMIT 11/20/2021 This is a 46 year old healthy woman who presented to the hospital in March with right shoulder pain for which she had received steroid injections. She was admitted from 3/12 through 3/14 with positive MRSA joint pain and sepsis. TEE was negative. Multiple joints were involved. 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 She required use of her right thigh left forearm right calf. On 3/20 she required IandD's of abscesses on the left thigh and right calf aspiration  of both knees and left knee and right ankle. 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 This was debrided by neurosurgery. At that point was put on daptomycin and Bactrim She is currently on Bactrim and Flagyl and daptomycin. She has 2 wounds 1 on the left lateral thigh 1 on the right posterior calf both of these look reasonably healthy. She is using a wound VAC changed by home health minimal drainage. They are here for a second opinion about condition of the wounds. They follow-up with their surgeons in a week's time. Using a wound VAC to both wound areas The patient's family member is a PA in [place]. [place] apparently at a wound care center 11-21-2021 upon evaluation today patient appears to be doing well with regard to her wounds. In fact this is the first time of seeing her she saw Dr. Leanord Hawking on admission. Nonetheless her wounds are doing significantly better. Fortunately there does not appear to be any evidence of infection locally nor systemically which is great news. No fevers, chills, nausea, vomiting, or diarrhea. 12-08-2021 upon evaluation today patient appears to be doing excellent. Fortunately there does not appear to be any evidence of active infection locally or systemically which is great news. No fevers, chills, nausea, vomiting, or diarrhea. Electronic Signature(s) Signed: 12/08/2021 9:12:08 AM By: Lenda Kelp PA-C Entered By: Lenda Kelp on 12/08/2021 09:12:07 Jill Cox, Jill Cox ([id number]) -------------------------------------------------------------------------------- Physical Exam Details Patient Name: Jill Cox Date of Service: 12/08/2021 8:30 AM Medical Record Number: [id number] Patient Account Number: [id number] Date of Birth/Sex: 1976/04/07 (46 y.o. F) Treating RN: Angelina Pih Primary Care Provider: Cleon Dew Other Clinician: Referring Provider: Cleon Dew Treating  Provider/Extender: Allen Derry Weeks in Treatment: 2 Constitutional Well-nourished and well-hydrated in no acute distress. Respiratory normal breathing without difficulty. Psychiatric this patient  is able to make decisions and demonstrates good insight into disease process. Alert and Oriented x 3. pleasant and cooperative. Notes Upon inspection patient's wound bed showed evidence of good granulation and epithelization at this point. In fact I think the right leg may be healed and the left leg is very close. Fortunately I do believe that we are on the right track here. Electronic Signature(s) Signed: 12/08/2021 9:12:23 AM By: Lenda KelpStone III, Anevay Campanella PA-C Entered By: Lenda KelpStone III, Jemina Scahill on 12/08/2021 09:12:23 Jill SchleinHUMMER, Jill Cox ([id number]) -------------------------------------------------------------------------------- Physician Orders Details Patient Name: Jill SchleinHUMMER, Jill Cox Date of Service: 12/08/2021 8:30 AM Medical Record Number: [id number] Patient Account Number: [id number] Date of Birth/Sex: [birth year] (45 y.o. 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 o Nurse Visit as needed Wound #2 Right,Posterior Lower Leg o Return Appointment in 1 week. [facility]. May utilize formulary equivalent dressing for wound treatment orders unless otherwise specified. Home Health Nurse may visit PRN to address patientos wound care needs. -  [facility]; Monday, Wednesday Friday unless patient has a wound care visit that day. o Scheduled days for dressing changes to be completed; exception, patient has scheduled wound care visit that day. o **Please direct any NON-WOUND related issues/requests for orders to patient's Primary Care Physician. **If current dressing causes regression in wound condition, may D/C ordered dressing product/s and apply Normal Saline Moist Dressing daily until next Wound Healing Center or Other MD appointment. **Notify Wound Healing Center of regression in wound condition at [phone]. Bathing/ Shower/ Hygiene o May shower; gently cleanse wound with antibacterial soap, rinse and pat dry prior to dressing wounds Negative Pressure Wound Therapy o Discontinue NPWT. Wound Treatment Wound #1 - Upper Leg Wound Laterality: Left, Lateral Cleanser: Wound Cleanser (Home Health) 3 x Per Week/30 Days Discharge Instructions: Wash your hands with soap and water. Remove old dressing, discard into plastic bag and place into trash. Cleanse the wound with Wound Cleanser prior to applying a clean dressing using gauze sponges, not tissues or cotton balls. Do not scrub or use excessive force. Pat dry using gauze sponges, not tissue or cotton balls. Peri-Wound Care: Skin Prep (Home Health) 3 x Per Week/30 Days Discharge Instructions: Use skin prep as directed Primary Dressing: Prisma 4.34 (in) 3 x Per Week/30 Days Discharge Instructions: Moisten w/ hydrogel, KY gel, or surgi gel Secondary Dressing: (SILICONE BORDER) Zetuvit Plus SILICONE BORDER Dressing 4x4 (in/in) (Home Health) 3 x Per Week/30 Days Discharge Instructions: Please do not put silicone bordered dressings under wraps. Use non-bordered dressing only. Wound #2 - Lower Leg Wound Laterality: Right, Posterior Cleanser: Wound Cleanser (Home Health) 3 x Per Week/30 Days Discharge Instructions: Wash your hands with soap and water. Remove old dressing, discard  into plastic bag and place into trash. Cleanse the wound with Wound Cleanser prior to applying a clean dressing using gauze sponges, not tissues or cotton balls. Do not scrub or use excessive force. Pat dry using gauze sponges, not tissue or cotton balls. Peri-Wound Care: Skin Prep (Home Health) 3 x Per Week/30 Days Discharge Instructions: Use skin prep as directed  Jill Cox, Jill Cox ([id number]) Primary Dressing: Hydrofera Blue Ready Transfer Foam, 2.5x2.5 (in/in) (Home Health) 3 x Per Week/30 Days Discharge Instructions: Apply Hydrofera Blue Ready to wound bed as directed Secondary Dressing: (SILICONE BORDER) Zetuvit Plus SILICONE BORDER Dressing 4x4 (in/in) (Home Health) 3 x Per Week/30 Days Discharge Instructions: Please do not put silicone bordered dressings under wraps. Use non-bordered dressing only. Electronic Signature(s) Signed: 12/08/2021 4:31:40 PM By: Angelina Pih Signed: 12/08/2021 4:54:58 PM By: Lenda Kelp PA-C Entered By: Angelina Pih on 12/08/2021 09:15:58 Jill Cox, Jill Cox ([id number]) -------------------------------------------------------------------------------- Problem List Details Patient Name: Jill Cox Date of Service: 12/08/2021 8:30 AM Medical Record Number: [id number] Patient Account Number: [id number] Date of Birth/Sex: 1976-01-21 (46 y.o. 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 F) Treating RN: Angelina Pih Primary Care Provider: Cleon Dew Other Clinician: Referring Provider: Cleon Dew Treating Provider/Extender: Rowan Blase in Treatment: 2 Subjective Chief Complaint Information obtained from Patient 11/20/2021; patient is here for 2 remaining wounds 1 on the left upper anterior thigh and 1 on the right posterior calf. Both of these wounds have wound vacs on them and seem to be in good condition History of Present Illness (HPI) IandDADMIT 11/20/2021 This is a 46 year old healthy woman who presented to the hospital in March with right shoulder pain for which she had received steroid injections. She was admitted from 3/12 through 3/14 with positive MRSA joint pain and sepsis. TEE was negative. Multiple joints were involved. 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 She required use of her right thigh left forearm right calf. On 3/20 she required IandD's of abscesses on the left thigh and right calf aspiration of both knees and left knee and right ankle. 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 This was debrided by neurosurgery. At that point was put on daptomycin and Bactrim She is currently on Bactrim and Flagyl and daptomycin. She has 2 wounds 1 on the left lateral thigh 1 on the right posterior calf both of these look reasonably healthy. She is using a wound VAC changed by home health minimal drainage. They are  here for a second opinion about condition of the wounds. They follow-up with their surgeons in a week's time. Using a wound VAC to both wound areas The patient's family member is a PA in [place]. [place] apparently at a wound care center 11-21-2021 upon evaluation today patient appears to be doing well with regard to her wounds. In fact this is the first time of seeing her she saw Dr. Leanord Hawking on admission. Nonetheless her wounds are doing significantly better. Fortunately there does not appear to be any evidence of infection locally nor  systemically which is great news. No fevers, chills, nausea, vomiting, or diarrhea. 12-08-2021 upon evaluation today patient appears to be doing excellent. Fortunately there does not appear to be any evidence of active infection locally or systemically which is great news. No fevers, chills, nausea, vomiting, or diarrhea. Objective Constitutional Well-nourished and well-hydrated in no acute distress. Vitals Time Taken: 8:28 AM, Height: 66 in, Weight: 162 lbs, BMI: 26.1, Temperature: 97.8 F, Pulse: 79 bpm, Respiratory Rate: 18 breaths/min, Blood Pressure: 149/84 mmHg. Respiratory normal breathing without difficulty. Psychiatric this patient is able to make decisions and demonstrates good insight into disease process. Alert and Oriented x 3. pleasant and cooperative. General Notes: Upon inspection patient's wound bed showed evidence of good granulation and epithelization at this point. In fact I think the right leg may be healed and the left leg is very close. Fortunately I do believe that we are on the right track here. Integumentary (Hair, Skin) Wound #1 status is Open. Original cause of wound was Gradually Appeared. The date acquired was: 11/03/2021. The wound has been in treatment 2 weeks. The wound is located on the Left,Lateral Upper Leg. The wound measures 0.5cm length x 0.1cm width x 0.2cm depth; 0.039cm^2 area and 0.008cm^3 volume. There is Fat Layer (Subcutaneous  Tissue) exposed. There is no tunneling or undermining noted. There is a medium amount of serous drainage noted. The wound margin is flat and intact. There is large (67-100%) granulation within the wound bed. There is a small (1-33%) amount of necrotic tissue within the wound bed. Jill Cox, Jill Cox ([id number]) Wound #2 status is Open. Original cause of wound was Gradually Appeared. The date acquired was: 11/03/2021. The wound has been in treatment 2 weeks. The wound is located on the Right,Posterior Lower Leg. The wound measures 0.1cm length x 0.1cm width x 0.1cm depth; 0.008cm^2 area and 0.001cm^3 volume. There is Fat Layer (Subcutaneous Tissue) exposed. There is no tunneling or undermining noted. There is a medium amount of serous drainage noted. The wound margin is flat and intact. There is large (67-100%) pale granulation within the wound bed. There is no necrotic tissue within the wound bed. 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 I am get a recommend that we go ahead and continue with the wound care measures as before and the patient is in agreement with plan. This includes the use of the border foam dressings which I do believe have done well for her. 2. I am going to switch over from [facility] to silver collagen on the left leg as I feel like this is probably can be a better way to go. 3. I would also suggest the patient continue to monitor for any signs of active infection at this time. Obviously if anything changes she should let me know but right now I think she is fine to go ahead and continue with the normal activity I think she is fine to work on her treadmill and any other activity that she is interested in doing currently. We will see patient back for reevaluation in 1 week here in  the clinic. If anything worsens or changes patient will contact our office for additional recommendations. Electronic Signature(s) Signed: 12/08/2021 9:13:44 AM By: Lenda Kelp PA-C Entered By: Lenda Kelp on 12/08/2021 09:13:44 Jill Cox ([id number]) -------------------------------------------------------------------------------- SuperBill Details Patient Name: Jill Cox Date of Service:  12/08/2021 Medical Record Number: [id number] Patient Account Number: [id number] Date of Birth/Sex: [birth year] (46 y.o. 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 A41.02 Sepsis due to Methicillin resistant Staphylococcus aureus Facility Procedures CPT4 Code: [id number] Description: 99213 - WOUND CARE VISIT-LEV 3 EST PT Modifier: Quantity: 1 Physician Procedures CPT4 Code: [id number] Description: 99213 - WC PHYS LEVEL 3 - EST PT Modifier: Quantity: 1 CPT4 Code: Description: ICD-10 Diagnosis Description 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

## 2021-12-14 ENCOUNTER — Other Ambulatory Visit: Payer: Self-pay | Admitting: Infectious Diseases

## 2021-12-14 MED ORDER — SULFAMETHOXAZOLE-TRIMETHOPRIM 800-160 MG PO TABS: 1.0000 | ORAL_TABLET | Freq: Two times a day (BID) | ORAL | 1 refills | Status: AC

## 2021-12-14 NOTE — Progress Notes (Signed)
Pt will be finishing daptomycin Will send prescription foe bactrim DS BID for 2 weeks

## 2021-12-15 ENCOUNTER — Encounter: Payer: 59 | Attending: Physician Assistant | Admitting: Physician Assistant

## 2021-12-15 DIAGNOSIS — L97218 Non-pressure chronic ulcer of right calf with other specified severity: Secondary | ICD-10-CM | POA: Insufficient documentation

## 2021-12-15 DIAGNOSIS — A4102 Sepsis due to Methicillin resistant Staphylococcus aureus: Secondary | ICD-10-CM | POA: Insufficient documentation

## 2021-12-15 DIAGNOSIS — L97128 Non-pressure chronic ulcer of left thigh with other specified severity: Secondary | ICD-10-CM | POA: Diagnosis present

## 2021-12-15 NOTE — Progress Notes (Addendum)
Jill Cox, Jill Cox ([id number]) Visit Report for 12/15/2021 Arrival Information Details Patient Name: Jill Cox Date of Service: 12/15/2021 8:30 AM Medical Record Number: [id number] Patient Account Number: [id number] Date of Birth/Sex: 1975-11-18 (46 y.o. F) Treating RN: Jill Cox Primary Care Jill Cox: Jill Cox Other Clinician: Referring Jill Cox: Jill Cox Treating Jill Cox/Extender: Jill Cox in Treatment: 3 Visit Information History Since Last Visit Added or deleted any medications: No Patient Arrived: Ambulatory Any new allergies or adverse reactions: No Arrival Time: 08:31 Had a fall or experienced change in No Accompanied By: family activities of daily living that may affect Transfer Assistance: None risk of falls: Patient Identification Verified: Yes Hospitalized since last visit: No Secondary Verification Process Completed: Yes Has Dressing in Place as Prescribed: Yes Patient Has Alerts: Yes Pain Present Now: Yes Patient Alerts: NOT DIABETIC Electronic Signature(s) Signed: 12/15/2021 4:40:28 PM By: Jill Cox Entered By: Jill Cox on 12/15/2021 08:31:40 Jill Cox ([id number]) -------------------------------------------------------------------------------- Clinic Level of Care Assessment Details Patient Name: Jill Cox Date of Service: 12/15/2021 8:30 AM Medical Record Number: [id number] Patient Account Number: [id number] Date of Birth/Sex: 07/01/1976 (46 y.o. F) Treating RN: Jill Cox Primary Care Jill Cox: Jill Cox Other Clinician: Referring Jill Cox: Jill Cox Treating Jill Cox/Extender: Jill Cox in Treatment: 3 Clinic Level of Care Assessment Items TOOL 4 Quantity Score []  - Use when only an EandM is performed on FOLLOW-UP visit 0 ASSESSMENTS - Nursing Assessment / Reassessment X - Reassessment of Co-morbidities (includes updates in patient status) 1 10 []  - 0 Reassessment of Adherence to  Treatment Plan ASSESSMENTS - Wound and Skin Assessment / Reassessment X - Simple Wound Assessment / Reassessment - one wound 1 5 []  - 0 Complex Wound Assessment / Reassessment - multiple wounds []  - 0 Dermatologic / Skin Assessment (not related to wound area) ASSESSMENTS - Focused Assessment []  - Circumferential Edema Measurements - multi extremities 0 []  - 0 Nutritional Assessment / Counseling / Intervention []  - 0 Lower Extremity Assessment (monofilament, tuning fork, pulses) []  - 0 Peripheral Arterial Disease Assessment (using hand held doppler) ASSESSMENTS - Ostomy and/or Continence Assessment and Care []  - Incontinence Assessment and Management 0 []  - 0 Ostomy Care Assessment and Management (repouching, etc.) PROCESS - Coordination of Care X - Simple Patient / Family Education for ongoing care 1 15 []  - 0 Complex (extensive) Patient / Family Education for ongoing care []  - 0 Staff obtains Programmer, systems, Records, Test Results / Process Orders []  - 0 Staff telephones HHA, Nursing Homes / Clarify orders / etc []  - 0 Routine Transfer to another Facility (non-emergent condition) []  - 0 Routine Hospital Admission (non-emergent condition) []  - 0 New Admissions / Biomedical engineer / Ordering NPWT, Apligraf, etc. []  - 0 Emergency Hospital Admission (emergent condition) X- 1 10 Simple Discharge Coordination []  - 0 Complex (extensive) Discharge Coordination PROCESS - Special Needs []  - Pediatric / Minor Patient Management 0 []  - 0 Isolation Patient Management []  - 0 Hearing / Language / Visual special needs []  - 0 Assessment of Community assistance (transportation, D/C planning, etc.) []  - 0 Additional assistance / Altered mentation []  - 0 Support Surface(s) Assessment (bed, cushion, seat, etc.) INTERVENTIONS - Wound Cleansing / Measurement Jill Cox ([id number]) X- 1 5 Simple Wound Cleansing - one wound []  - 0 Complex Wound Cleansing - multiple wounds X- 1  5 Wound Imaging (photographs - any number of wounds) []  - 0 Wound Tracing (instead of photographs) X- 1 5 Simple Wound Measurement - one wound []  - 0 Complex Wound  Measurement - multiple wounds INTERVENTIONS - Wound Dressings []  - Small Wound Dressing one or multiple wounds 0 []  - 0 Medium Wound Dressing one or multiple wounds []  - 0 Large Wound Dressing one or multiple wounds []  - 0 Application of Medications - topical []  - 0 Application of Medications - injection INTERVENTIONS - Miscellaneous []  - External ear exam 0 []  - 0 Specimen Collection (cultures, biopsies, blood, body fluids, etc.) []  - 0 Specimen(s) / Culture(s) sent or taken to Lab for analysis []  - 0 Patient Transfer (multiple staff / Civil Service fast streamer / Similar devices) []  - 0 Simple Staple / Suture removal (25 or less) []  - 0 Complex Staple / Suture removal (26 or more) []  - 0 Hypo / Hyperglycemic Management (close monitor of Blood Glucose) []  - 0 Ankle / Brachial Index (ABI) - do not check if billed separately X- 1 5 Vital Signs Has the patient been seen at the hospital within the last three years: Yes Total Score: 60 Level Of Care: New/Established - Level 2 Electronic Signature(s) Signed: 12/15/2021 4:40:28 PM By: Jill Cox Entered By: Jill Cox on 12/15/2021 09:00:04 Jill Cox ([id number]) -------------------------------------------------------------------------------- Encounter Discharge Information Details Patient Name: Jill Cox Date of Service: 12/15/2021 8:30 AM Medical Record Number: [id number] Patient Account Number: [id number] Date of Birth/Sex: 1975/12/27 (46 y.o. F) Treating RN: Jill Cox Primary Care Jill Cox: Jill Cox Other Clinician: Referring Jill Cox: Jill Cox Treating Jill Cox/Extender: Jill Cox in Treatment: 3 Encounter Discharge Information Items Discharge Condition: Stable Ambulatory Status: Ambulatory Discharge Destination:  Home Transportation: Private Auto Accompanied By: mother Schedule Follow-up Appointment: No Clinical Summary of Care: Electronic Signature(s) Signed: 12/15/2021 4:40:28 PM By: Jill Cox Entered By: Jill Cox on 12/15/2021 09:04:48 Jill Cox ([id number]) -------------------------------------------------------------------------------- Lower Extremity Assessment Details Patient Name: Jill Cox Date of Service: 12/15/2021 8:30 AM Medical Record Number: [id number] Patient Account Number: [id number] Date of Birth/Sex: [birth year] (45 y.o. F) Treating RN: Jill Cox Primary Care Glade Strausser: Jill Cox Other Clinician: Referring Rainy Rothman: Jill Cox Treating Zaniel Marineau/Extender: Jeri Cos Weeks in Treatment: 3 Edema Assessment Assessed: [Left: No] [Right: No] Edema: [Left: No] [Right: No] Electronic Signature(s) Signed: 12/15/2021 4:40:28 PM By: Jill Cox Entered By: Jill Cox on 12/15/2021 08:39:37 AULANI, SLAPPEY ([id number]) -------------------------------------------------------------------------------- Multi Wound Chart Details Patient Name: Jill Cox Date of Service: 12/15/2021 8:30 AM Medical Record Number: [id number] Patient Account Number: [id number] Date of Birth/Sex: 10/11/1975 (46 y.o. F) Treating RN: Jill Cox Primary Care Demri Poulton: Jill Cox Other Clinician: Referring Kylii Ennis: Jill Cox Treating Itai Barbian/Extender: Jill Cox in Treatment: 3 Vital Signs Height(in): 66 Pulse(bpm): 19 Weight(lbs): 162 Blood Pressure(mmHg): 168/99 Body Mass Index(BMI): 26.1 Temperature(F): 97.5 Respiratory Rate(breaths/min): 18 Photos: [N/A:N/A] Wound Location: Left, Lateral Upper Leg Right, Posterior Lower Leg N/A Wounding Event: Gradually Appeared Gradually Appeared N/A Primary Etiology: Infection - not elsewhere classified Infection - not elsewhere classified N/A Date Acquired: 11/03/2021 11/03/2021 N/A Weeks of  Treatment: 3 3 N/A Wound Status: Open Open N/A Wound Recurrence: No No N/A Measurements L x W x D (cm) 0.1x0.1x0.1 0.1x0.1x0.1 N/A Area (cm) : 0.008 0.008 N/A Volume (cm) : 0.001 0.001 N/A % Reduction in Area: 99.40% 99.40% N/A % Reduction in Volume: 99.90% 99.90% N/A Classification: Full Thickness Without Exposed Full Thickness Without Exposed N/A Support Structures Support Structures Exudate Amount: None Present None Present N/A Wound Margin: Flat and Intact Flat and Intact N/A Granulation Amount: None Present (0%) None Present (0%) N/A Necrotic Amount: None Present (0%) None Present (0%) N/A Exposed Structures: Fat Layer (Subcutaneous Tissue): Fat  Layer (Subcutaneous Tissue): N/A No No Epithelialization: Large (67-100%) Large (67-100%) N/A Treatment Notes Electronic Signature(s) Signed: 12/15/2021 4:40:28 PM By: Jill Cox Entered By: Jill Cox on 12/15/2021 08:53:43 BISMA, HAINS ([id number]) -------------------------------------------------------------------------------- Multi-Disciplinary Care Plan Details Patient Name: Jill Cox Date of Service: 12/15/2021 8:30 AM Medical Record Number: [id number] Patient Account Number: [id number] Date of Birth/Sex: Jul 09, 1976 (46 y.o. F) Treating RN: Jill Cox Primary Care Sorin Frimpong: Jill Cox Other Clinician: Referring Tabathia Knoche: Jill Cox Treating Nyiah Pianka/Extender: Jeri Cos Weeks in Treatment: 3 Active Inactive Electronic Signature(s) Signed: 12/15/2021 4:40:28 PM By: Jill Cox Entered By: Jill Cox on 12/15/2021 08:55:05 JODI, MCGURL ([id number]) -------------------------------------------------------------------------------- Pain Assessment Details Patient Name: Jill Cox Date of Service: 12/15/2021 8:30 AM Medical Record Number: [id number] Patient Account Number: [id number] Date of Birth/Sex: 1975/10/21 (46 y.o. F) Treating RN: Jill Cox Primary Care Casilda Pickerill: Jill Cox Other Clinician: Referring Tyshon Fanning: Jill Cox Treating Sophi Calligan/Extender: Jill Cox in Treatment: 3 Active Problems Location of Pain Severity and Description of Pain Patient Has Paino Yes Site Locations Rate the pain. Current Pain Level: 1 Pain Management and Medication Current Pain Management: Notes patient states pain in back Electronic Signature(s) Signed: 12/15/2021 4:40:28 PM By: Jill Cox Entered By: Jill Cox on 12/15/2021 08:33:50 Jill Cox ([id number]) -------------------------------------------------------------------------------- Patient/Caregiver Education Details Patient Name: Jill Cox Date of Service: 12/15/2021 8:30 AM Medical Record Number: [id number] Patient Account Number: [id number] Date of Birth/Gender: January 13, 1976 (46 y.o. F) Treating RN: Jill Cox Primary Care Physician: Jill Cox Other Clinician: Referring Physician: Bubba Cox Treating Physician/Extender: Jill Cox in Treatment: 3 Education Assessment Education Provided To: Patient Education Topics Provided Wound/Skin Impairment: Handouts: Other: care of healed wounds Methods: Explain/Verbal Responses: State content correctly Electronic Signature(s) Signed: 12/15/2021 4:40:28 PM By: Jill Cox Entered By: Jill Cox on 12/15/2021 09:00:37 Jill Cox ([id number]) -------------------------------------------------------------------------------- Wound Assessment Details Patient Name: Jill Cox Date of Service: 12/15/2021 8:30 AM Medical Record Number: [id number] Patient Account Number: [id number] Date of Birth/Sex: 10-Feb-1976 (46 y.o. F) Treating RN: Jill Cox Primary Care Rebakah Cokley: Jill Cox Other Clinician: Referring Vegas Coffin: Jill Cox Treating Jeslyn Amsler/Extender: Jill Cox in Treatment: 3 Wound Status Wound Number: 1 Primary Etiology: Infection - not elsewhere classified Wound  Location: Left, Lateral Upper Leg Wound Status: Healed - Epithelialized Wounding Event: Gradually Appeared Date Acquired: 11/03/2021 Weeks Of Treatment: 3 Clustered Wound: No Photos Wound Measurements Length: (cm) 0 Width: (cm) 0 Depth: (cm) 0 Area: (cm) 0 Volume: (cm) 0 % Reduction in Area: 100% % Reduction in Volume: 100% Epithelialization: Large (67-100%) Tunneling: No Undermining: No Wound Description Classification: Full Thickness Without Exposed Support Structures Wound Margin: Flat and Intact Exudate Amount: None Present Foul Odor After Cleansing: No Slough/Fibrino Yes Wound Bed Granulation Amount: None Present (0%) Exposed Structure Necrotic Amount: None Present (0%) Fat Layer (Subcutaneous Tissue) Exposed: No Treatment Notes Wound #1 (Upper Leg) Wound Laterality: Left, Lateral Cleanser Peri-Wound Care Topical Primary Dressing Secondary Dressing Secured With Compression MATTIGAN, HARTSON ([id number]) Compression Stockings Add-Ons Electronic Signature(s) Signed: 12/15/2021 4:40:28 PM By: Jill Cox Entered By: Jill Cox on 12/15/2021 08:54:45 Jill Cox ([id number]) -------------------------------------------------------------------------------- Wound Assessment Details Patient Name: Jill Cox Date of Service: 12/15/2021 8:30 AM Medical Record Number: [id number] Patient Account Number: [id number] Date of Birth/Sex: [birth year] (46 y.o. F) Treating RN: Jill Cox Primary Care Marquavius Scaife: Jill Cox Other Clinician: Referring Arijana Narayan: Jill Cox Treating Dardan Shelton/Extender: Jeri Cos Weeks in Treatment: 3 Wound Status Wound Number: 2 Primary Etiology: Infection - not elsewhere classified Wound Location: Right, Posterior Lower Leg Wound Status: Healed -  Epithelialized Wounding Event: Gradually Appeared Date Acquired: 11/03/2021 Weeks Of Treatment: 3 Clustered Wound: No Photos Wound Measurements Length: (cm) 0 Width:  (cm) 0 Depth: (cm) 0 Area: (cm) 0 Volume: (cm) 0 % Reduction in Area: 100% % Reduction in Volume: 100% Epithelialization: Large (67-100%) Tunneling: No Undermining: No Wound Description Classification: Full Thickness Without Exposed Support Structure Wound Margin: Flat and Intact Exudate Amount: None Present s Foul Odor After Cleansing: No Slough/Fibrino No Wound Bed Granulation Amount: None Present (0%) Exposed Structure Necrotic Amount: None Present (0%) Fat Layer (Subcutaneous Tissue) Exposed: No Treatment Notes Wound #2 (Lower Leg) Wound Laterality: Right, Posterior Cleanser Peri-Wound Care Topical Primary Dressing Secondary Dressing Secured With Compression AVYNN, SPLANE ([id number]) Compression Stockings Add-Ons Electronic Signature(s) Signed: 12/15/2021 4:40:28 PM By: Jill Cox Entered By: Jill Cox on 12/15/2021 08:54:45 Jill Cox ([id number]) -------------------------------------------------------------------------------- Vitals Details Patient Name: Jill Cox Date of Service: 12/15/2021 8:30 AM Medical Record Number: [id number] Patient Account Number: [id number] Date of Birth/Sex: 1975/10/14 (46 y.o. F) Treating RN: Jill Cox Primary Care Dak Szumski: Jill Cox Other Clinician: Referring Annabel Gibeau: Jill Cox Treating Elvera Almario/Extender: Jill Cox in Treatment: 3 Vital Signs Time Taken: 08:31 Temperature (F): 97.5 Height (in): 66 Pulse (bpm): 69 Weight (lbs): 162 Respiratory Rate (breaths/min): 18 Body Mass Index (BMI): 26.1 Blood Pressure (mmHg): 168/99 Reference Range: 80 - 120 mg / dl Electronic Signature(s) Signed: 12/15/2021 4:40:28 PM By: Jill Cox Entered By: Jill Cox on 12/15/2021 08:33:35

## 2021-12-15 NOTE — Progress Notes (Addendum)
Jill, Cox ([id number]) Visit Report for 12/15/2021 Chief Complaint Document Details Patient Name: Jill Cox, Jill Cox Date of Service: 12/15/2021 8:30 AM Medical Record Number: [id number] Patient Account Number: [id number] Date of Birth/Sex: 1975/10/14 (46 y.o. F) Treating RN: Levora Dredge Primary Care Provider: Bubba Camp Other Clinician: Referring Provider: Bubba Camp Treating Provider/Extender: Skipper Cliche in Treatment: 3 Information Obtained from: Patient Chief Complaint 11/20/2021; patient is here for 2 remaining wounds 1 on the left upper anterior thigh and 1 on the right posterior calf. Both of these wounds have wound vacs on them and seem to be in good condition Electronic Signature(s) Signed: 12/15/2021 8:30:17 AM By: Worthy Keeler PA-C Entered By: Worthy Keeler on 12/15/2021 08:30:17 NDEA, MOSKO ([id number]) -------------------------------------------------------------------------------- HPI Details Patient Name: Jill Cox Date of Service: 12/15/2021 8:30 AM Medical Record Number: [id number] Patient Account Number: [id number] Date of Birth/Sex: [birth year] (45 y.o. F) Treating RN: Levora Dredge Primary Care Provider: Bubba Camp Other Clinician: Referring Provider: Bubba Camp Treating Provider/Extender: Skipper Cliche in Treatment: 3 History of Present Illness HPI Description: IandDADMIT 11/20/2021 This is a 46 year old healthy woman who presented to the hospital in March with right shoulder pain for which she had received steroid injections. She was admitted from 3/12 through 3/14 with positive MRSA joint pain and sepsis. TEE was negative. Multiple joints were involved. 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 She required use of her right thigh left forearm right calf. On 3/20 she required IandD's of abscesses on the left thigh and right calf aspiration of  both knees and left knee and right ankle. 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 This was debrided by neurosurgery. At that point was put on daptomycin and Bactrim She is currently on Bactrim and Flagyl and daptomycin. She has 2 wounds 1 on the left lateral thigh 1 on the right posterior calf both of these look reasonably healthy. She is using a wound VAC changed by home health minimal drainage. They are here for a second opinion about condition of the wounds. They follow-up with their surgeons in a week's time. Using a wound VAC to both wound areas The patient's family member is a PA in [place] apparently at a wound care center 11-21-2021 upon evaluation today patient appears to be doing well with regard to her wounds. In fact this is the first time of seeing her she saw Dr. Dellia Nims on admission. Nonetheless her wounds are doing significantly better. Fortunately there does not appear to be any evidence of infection locally nor systemically which is great news. No fevers, chills, nausea, vomiting, or diarrhea. 12-08-2021 upon evaluation today patient appears to be doing excellent. Fortunately there does not appear to be any evidence of active infection locally or systemically which is great news. No fevers, chills, nausea, vomiting, or diarrhea. 12-15-2021 upon evaluation today patient appears to be doing well with regard to her wounds on the legs. Fortunately I do not see any signs of infection and overall I think that the patient is making excellent progress it appears that the wounds are both completely healed. Electronic Signature(s) Signed: 12/15/2021 9:05:02 AM By: Worthy Keeler PA-C Entered By: Worthy Keeler on 12/15/2021 09:05:02 ISANA, SCHULMEISTER ([id number]) -------------------------------------------------------------------------------- Physical Exam Details Patient Name: Jill Cox Date of Service:  12/15/2021 8:30 AM Medical Record Number: [id number] Patient Account Number:  [id number] Date of Birth/Sex: [birth year] (46 y.o. F) Treating RN: Levora Dredge Primary Care Provider: Bubba Camp Other Clinician: Referring Provider: Bubba Camp Treating Provider/Extender: Jeri Cos Weeks in Treatment: 3 Constitutional Well-nourished and well-hydrated in no acute distress. Respiratory normal breathing without difficulty. Psychiatric this patient is able to make decisions and demonstrates good insight into disease process. Alert and Oriented x 3. pleasant and cooperative. Notes Upon inspection patient showed signs of complete epithelization which is great news and overall I am extremely pleased in that regard. I do not see any signs of infection locally or systemically which is also excellent. Electronic Signature(s) Signed: 12/15/2021 9:05:15 AM By: Worthy Keeler PA-C Entered By: Worthy Keeler on 12/15/2021 09:05:15 Jill Cox ([id number]) -------------------------------------------------------------------------------- Physician Orders Details Patient Name: Jill Cox Date of Service: 12/15/2021 8:30 AM Medical Record Number: [id number] Patient Account Number: [id number] Date of Birth/Sex: 1976-07-14 (45 y.o. 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 Please call  clinic with any issues to healed wounds. Home Health o DISCONTINUE Home Health for Wound Care. - patients wounds are healed Electronic Signature(s) Signed: 12/15/2021 4:36:57 PM By: Worthy Keeler PA-C Signed: 12/15/2021 4:40:28 PM By: Levora Dredge Entered By: Levora Dredge on 12/15/2021 09:04:17 GWYNN, HEIN ([id number]) -------------------------------------------------------------------------------- Problem List Details Patient Name: Jill Cox Date of Service: 12/15/2021 8:30 AM Medical Record Number: [id number] Patient Account Number: [id number] Date of Birth/Sex: 1976-06-09 (45 y.o. 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 F) Treating RN: Levora Dredge Primary Care Provider: Bubba Camp Other Clinician: Referring Provider: Bubba Camp Treating Provider/Extender: Skipper Cliche in Treatment: 3 Subjective Chief Complaint Information obtained from Patient 11/20/2021; patient is here for 2 remaining wounds 1 on  the left upper anterior thigh and 1 on the right posterior calf. Both of these wounds have wound vacs on them and seem to be in good condition History of Present Illness (HPI) IandDADMIT 11/20/2021 This is a 46 year old healthy woman who presented to the hospital in March with right shoulder pain for which she had received steroid injections. She was admitted from 3/12 through 3/14 with positive MRSA joint pain and sepsis. TEE was negative. Multiple joints were involved. 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 She required  use of her right thigh left forearm right calf. On 3/20 she required IandD's of abscesses on the left thigh and right calf aspiration of both knees and left knee and right ankle. 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 This was debrided by neurosurgery. At that point was put on daptomycin and Bactrim She is currently on Bactrim and Flagyl and daptomycin. She has 2 wounds 1 on the left lateral thigh 1 on the right posterior calf both of these look reasonably healthy. She is using a wound VAC changed by home health minimal drainage. They are here for a second opinion about condition of the wounds. They follow-up with their surgeons in a week's time. Using a wound VAC to both wound areas The patient's family member is a PA in [place] apparently at a wound care center 11-21-2021 upon evaluation today patient appears to be doing well with regard to her wounds. In fact this is the first time of seeing her she saw Dr. Dellia Nims on admission. Nonetheless her wounds are doing significantly better. Fortunately there does not appear to be any evidence of infection locally nor systemically which is great news. No fevers, chills, nausea, vomiting, or diarrhea. 12-08-2021 upon evaluation today patient appears to be doing excellent.  Fortunately there does not appear to be any evidence of active infection locally or systemically which is great news. No fevers, chills, nausea, vomiting, or diarrhea. 12-15-2021 upon evaluation today patient appears to be doing well with regard to her wounds on the legs. Fortunately I do not see any signs of infection and overall I think that the patient is making excellent progress it appears that the wounds are both completely healed. Objective Constitutional Well-nourished and well-hydrated in no acute distress. Vitals Time Taken: 8:31 AM, Height: 66 in, Weight: 162 lbs, BMI: 26.1, Temperature: 97.5 F, Pulse: 69 bpm, Respiratory Rate: 18 breaths/min, Blood Pressure: 168/99 mmHg. Respiratory normal breathing without difficulty. Psychiatric this patient is able to make decisions and demonstrates good insight into disease process. Alert and Oriented x 3. pleasant and cooperative. General Notes: Upon inspection patient showed signs of complete epithelization which is great news and overall I am extremely pleased in that regard. I do not see any signs of infection locally or systemically which is also excellent. Integumentary (Hair, Skin) Wound #1 status is Healed - Epithelialized. Original cause of wound was Gradually Appeared. The date acquired was: 11/03/2021. The wound has been in treatment 3 weeks. The wound is located on the Left,Lateral Upper Leg. The wound measures 0cm length x 0cm width x 0cm depth; 0cm^2 area and 0cm^3 volume. There is no tunneling or undermining noted. There is a none present amount of drainage noted. The wound [place], [place] ([id number]) margin is flat and intact. There is no granulation within the wound bed. There is no necrotic tissue within the wound bed. Wound #2 status is Healed - Epithelialized. Original cause of wound was Gradually Appeared. The date acquired was: 11/03/2021. The wound has been in treatment 3 weeks. The wound is located on the Right,Posterior Lower  Leg. The wound measures 0cm length x 0cm width x 0cm depth; 0cm^2 area and 0cm^3 volume. There is no tunneling or undermining noted. There is a none present amount of drainage noted. The wound margin is flat and intact. There is no granulation within the wound bed. There is no necrotic tissue within the wound bed. 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 Please call clinic with any issues to healed wounds. Home Health: DISCONTINUE Home Health for Wound Care. - patients wounds are healed 1. I am going to recommend that we continue with the wound care measures as before the patient is in agreement with plan this includes the use of the protection of the area as I do think using a topical AandD ointment would be perfect several times a day to help with the scarring. 2. I am also going to suggest at this time that the patient when she goes to the beach use Tegaderm over the areas just to ensure that nothing rubs or tears open any of the spots this will protect them quite nicely. We will see her back for follow-up visit as needed. Electronic Signature(s) Signed: 12/15/2021 9:06:06 AM By: Worthy Keeler PA-C Entered By: Worthy Keeler on 12/15/2021 09:06:06 Jill Cox ([id number]) -------------------------------------------------------------------------------- SuperBill Details Patient Name: Jill Cox Date of Service: 12/15/2021 Medical Record Number: [id number] Patient Account Number: [id number] Date of Birth/Sex: 1975-09-11 (46 y.o. 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 A41.02 Sepsis due to Methicillin resistant Staphylococcus aureus Facility Procedures CPT4 Code: [id number] Description: [phone] - WOUND CARE VISIT-LEV 2 EST PT Modifier: Quantity: 1 Physician Procedures CPT4 Code: [id number] Description: [id number] - WC PHYS LEVEL 3 - EST PT Modifier: Quantity: 1 CPT4 Code: Description: ICD-10 Diagnosis Description [id number] 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

## 2021-12-19 ENCOUNTER — Encounter: Admitting: Physical Therapy

## 2021-12-21 ENCOUNTER — Encounter: Admitting: Physical Therapy

## 2021-12-22 ENCOUNTER — Encounter: Payer: 59 | Admitting: Physician Assistant

## 2021-12-22 ENCOUNTER — Other Ambulatory Visit
Admission: RE | Admit: 2021-12-22 | Discharge: 2021-12-22 | Disposition: A | Discharge status: Home / Self Care | Payer: 59 | Attending: Infectious Diseases | Admitting: Infectious Diseases

## 2021-12-22 DIAGNOSIS — R7881 Bacteremia: Secondary | ICD-10-CM | POA: Diagnosis present

## 2021-12-22 DIAGNOSIS — B9562 Methicillin resistant Staphylococcus aureus infection as the cause of diseases classified elsewhere: Secondary | ICD-10-CM | POA: Diagnosis present

## 2021-12-26 ENCOUNTER — Ambulatory Visit: Payer: 59 | Attending: Infectious Diseases | Admitting: Infectious Diseases

## 2021-12-26 ENCOUNTER — Encounter: Admitting: Physical Therapy

## 2021-12-26 ENCOUNTER — Other Ambulatory Visit
Admission: RE | Admit: 2021-12-26 | Discharge: 2021-12-26 | Disposition: A | Discharge status: Home / Self Care | Payer: 59 | Attending: Infectious Diseases | Admitting: Infectious Diseases

## 2021-12-26 VITALS — BP 141/94 | HR 80 | Temp 97.3°F | Ht 66.0 in | Wt 159.0 lb

## 2021-12-26 DIAGNOSIS — A4902 Methicillin resistant Staphylococcus aureus infection, unspecified site: Secondary | ICD-10-CM | POA: Insufficient documentation

## 2021-12-26 LAB — C-REACTIVE PROTEIN: CRP: 0.6 mg/dL (ref <1.0)

## 2021-12-26 LAB — SEDIMENTATION RATE: Sed Rate: 15 mm/hr (ref 0–20)

## 2021-12-26 MED ORDER — MUPIROCIN 2 % EX OINT: 1.0000 "application " | TOPICAL_OINTMENT | Freq: Two times a day (BID) | CUTANEOUS | 0 refills | Status: AC

## 2021-12-26 MED ORDER — CHLORHEXIDINE GLUCONATE 2 % EX SOLN: 15.0000 mL | Freq: Every day | CUTANEOUS | 0 refills | Status: AC

## 2021-12-26 NOTE — Progress Notes (Signed)
NAME: Jill Cox  DOB: 1976/02/17  MRN: [id number]  Date/Time: 12/26/2021 8:46 AM   Subjective:  Follow up visit for disseminated MRSA infection - here with her husband Has completed Iv antibiotics and then Po bactrim and now on clindamycin after teeth extraction Last dose today 12/22/21 BC NG Rt calf and left thigh wounds have healed- wound vac out Feeling better overall but thinks she will need more PT Tight in her back Rt thigh / knee some stiffness  ? Jill Cox is a 46 y.o. female Following taken from previous note Patient has complicated infection history Her history is as follows.  Patient was first admitted to [facility] between 09/24/2021 until 09/26/2021 for MRSA bacteremia with multiple septic arthritis and pyomyositis and was transferred to [facility] on 09/26/2021 and stayed there till 10/06/2021.  She had persistent bacteremia on 3/11, 3/12, 3/13 and 09/27/2021. On 09/26/2021 TEE was negative Then she had blood cultures on 3/16, 3/17 and 3/19 and they were negative. She underwent forearm abscess incision and drainage on 09/30/2021 and also had a fourth dorsal compartment extensor tenosynovectomy.  Culture was positive for MRSA She had aspiration of the right knee on 10/02/2021 and that was negative for MRSA.  She had aspiration of the right ankle on 10/02/2021 it was positive for MRSA.  She had a right calf abscess drained and that was positive for MRSA She had aspiration of left knee on 10/02/2021 and that was negative for MRSA She had left thigh abscess drained on 10/02/2021 and that was positive for MRSA. She also had extensive vertebral involvement.  There was right facet joint involvement at T2-T3/T3-T4.  Cervical spine dorsal epidural collection of 5 mm extending C1-C7 with moderate stenosis at C4-C5, C5-C6 and C6-C7 level Lumbar MRI done without contrast on 09/28/2021 showed a collection at the left aspect of the thecal sac at L5 measuring 10 into 1123 mm. She was initially treated with  dual MRSA antibiotic coverage.  She had ceftaroline and daptomycin.  On discharge it was changed to daptomycin to complete 6 weeks until 11/13/2021. After she went home she was doing fine for a week and then started doing stretching and started noticing pain left lumbar area.  Also the right calf I&D site had some foul-smelling discharge and the sutures were removed.  She also felt the left thigh site was full and painful.  Because of worsening pain she came to [facility] on 10/31/2021. The blood culture from the PICC site was positive for MRSA but the peripheral site was negative.  She had right calf wound culture which was positive for Proteus and anaerobes.  She underwent Right calf abscess I&D and left thigh abscess I&D and wound VAC was placed The culture from the right calf abscess was Proteus and anaerobes. The left thigh abscess had no bacteria For the epidural abscess at L5-S1 disc space she was seen by Dr. Cari Caraway and underwent L5-S1 laminal foraminotomy for evacuation of epidural abscess.  The culture was positive for MRSA.  She also had a pilonidal sinus and the culture was positive for Proteus.  She was on daptomycin and ceftaroline until discharge.  On discharge she was sent on daptomycin 10 mg/kg body weight to complete 6 weeks on 12/14/2021 and Bactrim double strength twoo twice daily for 2 weeks for the Proteus and metronidazole for 2 weeks as well.    I last saw her on 11/28/21- She completed IV on 6/1 and PICC was removed She was placed on Bactrim DS 1  bid for 2 weeks which she has completed    Past Medical History:  Diagnosis Date   Depression    Engages in vaping    PONV (postoperative nausea and vomiting)     Past Surgical History:  Procedure Laterality Date   CHOLECYSTECTOMY     I & D EXTREMITY Left 09/30/2021   Procedure: IRRIGATION AND DEBRIDEMENT LEFT FOREARM ABSCESS;  Surgeon: Iran Planas, MD;  Location: [place];  Service: Orthopedics;  Laterality: Left;   I & D EXTREMITY  Bilateral 10/02/2021   Procedure: IRRIGATION AND DEBRIDEMENT EXTREMITY;  Surgeon: Altamese Chesterland, MD;  Location: [place];  Service: Orthopedics;  Laterality: Bilateral;  arthrocentsis bilateral knees and right ankle    INCISION AND DRAINAGE OF WOUND Bilateral 11/02/2021   Procedure: IRRIGATION AND DEBRIDEMENT WOUND LEFT THIGH, AND RIGHT CALF;  Surgeon: Hessie Knows, MD;  Location: [facility];  Service: Orthopedics;  Laterality: Bilateral;  RIGHT LOWER LEG, LEFT UPPER LEG   LUMBAR LAMINECTOMY FOR EPIDURAL ABSCESS N/A 11/03/2021   Procedure: L5-S1 EVACUATION OF EPIDURAL ABSCESS;  Surgeon: Meade Maw, MD;  Location: [facility];  Service: Neurosurgery;  Laterality: N/A;   TEE WITHOUT CARDIOVERSION N/A 09/26/2021   Procedure: TRANSESOPHAGEAL ECHOCARDIOGRAM (TEE);  Surgeon: Minna Merritts, MD;  Location: [facility];  Service: Cardiovascular;  Laterality: N/A;   TEE WITHOUT CARDIOVERSION N/A 11/07/2021   Procedure: TRANSESOPHAGEAL ECHOCARDIOGRAM (TEE);  Surgeon: Minna Merritts, MD;  Location: [facility];  Service: Cardiovascular;  Laterality: N/A;    Social History   Socioeconomic History   Marital status: Married    Spouse name: Not on file   Number of children: Not on file   Years of education: Not on file   Highest education level: Not on file  Occupational History   Not on file  Tobacco Use   Smoking status: Every Day    Types: E-cigarettes    Passive exposure: Past   Smokeless tobacco: Never   Tobacco comments:    Pt is doing vaping  Substance and Sexual Activity   Alcohol use: Not Currently   Drug use: Never   Sexual activity: Not on file  Other Topics Concern   Not on file  Social History Narrative   Not on file   Social Determinants of Health   Financial Resource Strain: Not on file  Food Insecurity: Not on file  Transportation Needs: Not on file  Physical Activity: Not on file  Stress: Not on file  Social Connections: Not on file  Intimate Partner Violence: Not on file     Family History  Problem Relation Age of Onset   Heart disease Mother    Heart disease Father    No Known Allergies I? Current Outpatient Medications  Medication Sig Dispense Refill   acetaminophen (TYLENOL) 325 MG tablet Take 2 tablets (650 mg total) by mouth every 6 (six) hours as needed for mild pain or fever.     clindamycin (CLEOCIN) 150 MG capsule Take 150 mg by mouth 3 (three) times daily.     docusate sodium (COLACE) 100 MG capsule Take 1 capsule (100 mg total) by mouth 2 (two) times daily. 10 capsule 0   feeding supplement (ENSURE ENLIVE / ENSURE PLUS) LIQD Take 237 mLs by mouth 2 (two) times daily between meals. 237 mL 12   ferrous sulfate 325 (65 FE) MG tablet Take 1 tablet (325 mg total) by mouth daily with breakfast. 30 tablet 1   lidocaine (XYLOCAINE) 5 % ointment Apply 1 application. topically  4 (four) times daily as needed. 35.44 g 1   methocarbamol (ROBAXIN) 500 MG tablet Take 1 tablet (500 mg total) by mouth every 6 (six) hours as needed for muscle spasms. 30 tablet 0   Multiple Vitamin (MULTIVITAMIN WITH MINERALS) TABS tablet Take 1 tablet by mouth daily.     naproxen (NAPROSYN) 500 MG tablet Take 500 mg by mouth 2 (two) times daily as needed.     oxyCODONE (OXY IR/ROXICODONE) 5 MG immediate release tablet Take 1 tablet (5 mg total) by mouth every 4 (four) hours as needed for severe pain or moderate pain. 30 tablet 0   polyethylene glycol (MIRALAX / GLYCOLAX) 17 g packet Take 17 g by mouth daily as needed for moderate constipation or mild constipation. 14 each 0   senna-docusate (SENOKOT-S) 8.6-50 MG tablet Take 1 tablet by mouth at bedtime as needed for moderate constipation. 30 tablet 0   sertraline (ZOLOFT) 25 MG tablet Take 25 mg by mouth every morning.     traZODone (DESYREL) 50 MG tablet Take 1 tablet (50 mg total) by mouth at bedtime as needed for sleep. 10 tablet 0   ondansetron (ZOFRAN) 4 MG tablet Take 1 tablet (4 mg total) by mouth every 6 (six) hours as  needed for nausea. (Patient not taking: Reported on 12/26/2021) 20 tablet 0   sulfamethoxazole-trimethoprim (BACTRIM DS) 800-160 MG tablet Take 1 tablet by mouth 2 (two) times daily. (Patient not taking: Reported on 12/26/2021) 28 tablet 1   No current facility-administered medications for this visit.     Abtx:  Anti-infectives (From admission, onward)    None       REVIEW OF SYSTEMS:  Const: negative fever, negative chills, negative weight loss Eyes: negative diplopia or visual changes, negative eye pain ENT: negative coryza, negative sore throat Resp: negative cough, hemoptysis, dyspnea Cards: negative for chest pain, palpitations, lower extremity edema GU: negative for frequency, dysuria and hematuria GI: Negative for abdominal pain, diarrhea, bleeding, constipation Skin: negative for rash and pruritus Heme: negative for easy bruising and gum/nose bleeding MS: low back stiffness Rt thigh/knee stiffness Neurolo:negative for headaches, dizziness, vertigo, memory problems  Endocrine: negative for thyroid, diabetes Allergy/Immunology- negative for any medication or food allergies  Objective:  VITALS:  BP (!) 141/94   Pulse 80   Temp (!) 97.3 F (36.3 C) (Temporal)   Ht _0  (1.676 m)   Wt 159 lb (72.1 kg)   LMP  (LMP Unknown)   BMI 25.66 kg/m  PHYSICAL EXAM:  General: Alert, cooperative, no distress, appears stated age.  Head: Normocephalic, without obvious abnormality, atraumatic. Eyes: Conjunctivae clear, anicteric sclerae. Pupils are equal ENT Nares normal. No drainage or sinus tenderness. Lips, mucosa, and tongue normal. No Thrush Neck: Supple, symmetrical, no adenopathy, thyroid: non tender no carotid bruit and no JVD. Back: No CVA tenderness. Lungs: Clear to auscultation bilaterally. No Wheezing or Rhonchi. No rales. Heart: Regular rate and rhythm, no murmur, rub or gallop. Abdomen: Not examined Pilonidal sinus some fibrinous material Extremities: Right calf  wound healed completely Left thigh wound healed completely Low back surgical site is healed very well Neurologic: Grossly non-focal  12/13/21 ESR 70 ? 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  Left thigh abscess- resolved   L5-S1 discitis and abscess status post  washout- resolved  Eventhough MRSA nares negative, because of extensive /disseminated MRSA infection will do decolonization with CHG. Mupirocin   Pilonidal sinus infection --Proteus and she completed 2 weeks of Bactrim    Anemia  improved  Hypoalbuminemia improved    Discussed the management with patient and her husband Will do labs today- ESR/CRP If they are normal she will not need any further antibiotics Follow PRN ___________________________________________________ ESR 15 CRP 0.6 Follow PRN Note:  This document was prepared using Dragon voice recognition software and may include unintentional dictation errors.

## 2021-12-26 NOTE — Patient Instructions (Addendum)
You are here for follow up- you have competed antibiotics for MRSA and you are on clinda for teeth extraction You can try decolonization protocol Follow PRN    Prepare Chose a period when you will be uninterrupted by going away or other distractions. To ensure that your skin is in good condition, follow the Routine Skin Care principles below to reduce drying and enhance healing. Do not start while you have any active boils. Routine Skin Care principles to reduce drying and enhance healing: Avoid the use of soap when bathing or showering when performing this decolonization. DO NOT routinely use antiseptic solutions. If you need to use something, chose a soap substitute (examples -QV Wash or Cetaphil).  When drying with a towel, be gentle and pat dry your skin. Avoid rubbing the skin.  Reduce the overall frequency of bathing or showering. A short shower (3 minutes) is better than a bath in terms of its effect on the skin.  Use a simple sorbelene based-cream on your skin prior to showering and immediately after drying. (Examples: Hydroderm or other Sorbelene-based preparations). Especially protect healing or dry areas of skin in this way. Don't use a barrier cream with a vaseline base or with perfumes and additives. You are more likely to be allergic to these products, and cause further damage to your skin.  Make sure that you clean and cover any skin cuts or grazes that occur. Try to avoid picking or biting fingernails and the skin around the nails Keep your fingernails clipped short and clean to reduce problems caused by scratching.  For itchy skin, try gently massaging sorbelene-based cream into itchy areas instead of scratching it. A long-acting non-sedating antihistamine drug is the next option to try. However make sure that you are not taking medication that will interact with this drug type.                                                                         To prepare yourself for your  treatment, it is recommended that you complete the following steps: Remove nose, ear and other body piercing items for several days prior to the treatment and keep them out during the treatment period  Purchase a new toothbrush, disposable razor (if used), sterident for dentures (if required) and a container of alcohol hand hygiene solution (gel or rub)  Discard old toothbrushes and razors when the treatment starts. Also discard opened deodorant rollers, skin adhesive tapes, skin creams and solutions- all of these may already be contaminated with staph  Discard pumice stone(s), sponges and disposable face cloths if used   Discard all make-up brushes, creams, and implements  Discard or hot wash all fluffy toys  Wash hair brush and comb, nail files, plastic toys and cutters in the dishwasher or purchase new ones  Remove nail varnish and artificial nails  Daily routine for 5 days     *Minimize contact with members of the community during the 5 days of treatment as much as possible* Body washes The effectiveness of the program increases if the correct procedure is used: Apply the provided antiseptic body wash (2% chlorhexidine) in the shower daily  Take care to wash hair, under the arms and into the groin and  into any folds of skin  Allow the antiseptic to remain on the skin for at least 5 minutes  Nasal ointment Disinfect your hands with alcohol gel/rub and allow to dry   Open the mupirocin 2% (Bactroban) nasal ointment.  Place small amount (size of match head) of ointment onto a clean cotton swab and massage gently around the inside of the nostril on one side, making sure not to insert it too deeply (no more than 2 cm or a little less than an inch).  Use a new cotton bud for the other nostril so that you do not contaminate the Bactroban tube with staph.   After applying the ointment, press a finger against the nose next to the nostril opening and use a circular motion to spread the ointment within  the nose  Apply the mupirocin ointment two times a day for 5 days  Disinfect your hands with alcohol rub/gel after applying the ointmentp>  Personal items (combs, razor, eyeglasses, jewelry, etc.)     Disinfect all personal items daily with an alcohol-based cleanser  House Environment and Clothes/Linens On day 2 and 5 of the treatment, clean your house, (especially the bedroom and bathroom). Clean dust off all surfaces and then vacuum clean all floor surfaces AND soft furnishings (such as your favorite chair). If your chair/couch has a vinyl or leather covering then wipe over the chair with warm soapy water and then dry with a clean towel (which should then be washed). Staph lives in skin scales from humans that contaminate the environment. This can lead to re-infection.   Disinfect the shower floor and/or bath tub daily   On days 1, 3, AND 5 of the treatment wash your clothes, underwear, pajamas and bed linen (such as towels, sheets, washcloths, and bath mats). A hot wash with laundry detergent is best (there is no need to use expensive laundry detergent or powder). Dry clothes in sun if possible. Change into clean clothes or pajamas on those days after your shower.   Do not share or exchange any personal items of clothing  Sports/Gym     Surfaces, equipment and towels, and skin-to-skin contact are all potential sources for staph re-infection Pets Dogs and other companion animals can also be colonized with the same strains of MRSA. Best to wash or replace bedding material for the animal and wash the animal at least once during the treatment period with antiseptic solution (2% chlorhexidine wash). Ensure that the skin of the animal is kept in good condition. If the pet has any chronic skin disorders, consult your vet prior to starting your treatment process. What about my partner, family, or household members? Usually when an aggressive strain of staph moves in to a family or household, only certain  members of that group get infections (boils). This is despite the fact that the strain has probably transferred itself from person to person within the group. Those without boils may also be carrying the bacterium, however they must have better resistance (immunity) or perhaps have better skin condition. Staph likes to invade through cuts, scratches and skin with dermatitis or dryness.    Follow-up after decolonization treatment  Possible approaches will vary depending on how your treatment goes. Your provider will instruct you on the follow-up best suited for you. Some options are:  Wait and see - if no further boils occur within 6 months then it is probable that the strain of staph has been eliminated from you.   Continue intermittent body washes  1-2 times per week with 2% aqueous chlorhexidine soap preparation or similar   Future antibiotic use  The best preventative approach to avoiding future problems may be to avoid use of antibiotics unless there is a strong indication. Antibiotics are often prescribed for minor infections or for respiratory infections that are mostly due to viruses. It is in your best interest to ride these infections out rather than taking antibiotics. Taking antibiotics alters your natural bacterial flora on your skin and in your gut. This may reduce your resistance against acquiring a resistant bacterial strain such as MRSA).   Important note: if you do become very ill with possible infection and require hospital review, it is important for you to remind your medical care providers that you have been colonized with MRSA in the past as they may have to use antibiotics that are active against the strain that you had previously.   References Wiese-Posselt et al. Clin Inf Dis [id number]  CDC:  StyleTurn.tn.html   Bleach baths  Take a bleach baths twice a week for at least 15 minutes with any kind of soap for 3 weeks. Bleach baths can be a good  treatment for people who do not have broken skin or eczema. They can help prevent MRSA from coming back. Use lotion after the bath, because a bleach bath can dry out the skin.    How much bleach to put in: an average full bathtub (adult level) holds about 40 gallons of water, add 1/4 cup bleach for each 1/4 fill of the bathtub.

## 2021-12-27 ENCOUNTER — Telehealth: Payer: Self-pay

## 2021-12-27 LAB — CULTURE, BLOOD (SINGLE)
Culture: NO GROWTH
Culture: NO GROWTH
Special Requests: ADEQUATE
Special Requests: ADEQUATE

## 2021-12-27 NOTE — Telephone Encounter (Signed)
-----  Message from Tsosie Billing, MD sent at 12/27/2021  7:26 AM EDT ----- Regarding: FW: Can you please let her know that ESR and CRP Drawn yesterday are both normal.  Thx ----- Message ----- From: Interface, [facility] In [place] Sent: 12/26/2021  10:19 AM EDT To: Tsosie Billing, MD

## 2021-12-28 ENCOUNTER — Encounter: Admitting: Physical Therapy

## 2021-12-29 ENCOUNTER — Encounter: Payer: 59 | Admitting: Physician Assistant

## 2022-01-02 ENCOUNTER — Encounter: Admitting: Physical Therapy

## 2022-01-04 ENCOUNTER — Encounter: Admitting: Physical Therapy

## 2022-01-05 ENCOUNTER — Encounter: Payer: 59 | Admitting: Physician Assistant

## 2022-01-12 ENCOUNTER — Encounter: Payer: 59 | Admitting: Physician Assistant

## 2022-01-25 ENCOUNTER — Ambulatory Visit: Admitting: Neurosurgery

## 2022-01-25 DIAGNOSIS — G062 Extradural and subdural abscess, unspecified: Secondary | ICD-10-CM

## 2022-01-25 NOTE — Progress Notes (Signed)
   DOS: 11/03/21 (L L5/S1 epidural abscess washout)  HISTORY OF PRESENT ILLNESS: 01/25/2022 Ms. Jill Cox is status post evacuation of epidural abscess. She is doing well. She is off antibiotics and doing well.    PHYSICAL EXAMINATION:  None performed - telephone visit IMAGING: No interval imaging to review   ASSESSMENT/PLAN:  Jill Cox is doing well after MRSA infection.  She is doing well.  I will see her back on an as needed basis.   Venetia Night MD, [facility] Department of Neurosurgery
